# Patient Record
Sex: Female | Born: 1990 | Race: Black or African American | Hispanic: No | Smoking: Never smoker
Health system: Southern US, Community
[De-identification: ages and names within clinical notes are randomized; demographics above are authoritative.]

## PROBLEM LIST (undated history)

## (undated) ENCOUNTER — Inpatient Hospital Stay (HOSPITAL_COMMUNITY): Payer: Self-pay

## (undated) DIAGNOSIS — I10 Essential (primary) hypertension: Secondary | ICD-10-CM

## (undated) DIAGNOSIS — Z8742 Personal history of other diseases of the female genital tract: Secondary | ICD-10-CM

## (undated) DIAGNOSIS — N83209 Unspecified ovarian cyst, unspecified side: Secondary | ICD-10-CM

## (undated) DIAGNOSIS — E119 Type 2 diabetes mellitus without complications: Secondary | ICD-10-CM

## (undated) DIAGNOSIS — Z3490 Encounter for supervision of normal pregnancy, unspecified, unspecified trimester: Secondary | ICD-10-CM

## (undated) DIAGNOSIS — K5900 Constipation, unspecified: Secondary | ICD-10-CM

## (undated) DIAGNOSIS — O139 Gestational [pregnancy-induced] hypertension without significant proteinuria, unspecified trimester: Secondary | ICD-10-CM

## (undated) DIAGNOSIS — E162 Hypoglycemia, unspecified: Secondary | ICD-10-CM

## (undated) DIAGNOSIS — D573 Sickle-cell trait: Secondary | ICD-10-CM

## (undated) DIAGNOSIS — K259 Gastric ulcer, unspecified as acute or chronic, without hemorrhage or perforation: Secondary | ICD-10-CM

## (undated) DIAGNOSIS — O24419 Gestational diabetes mellitus in pregnancy, unspecified control: Secondary | ICD-10-CM

## (undated) DIAGNOSIS — D649 Anemia, unspecified: Secondary | ICD-10-CM

## (undated) HISTORY — DX: Essential (primary) hypertension: I10

## (undated) HISTORY — DX: Personal history of other diseases of the female genital tract: Z87.42

## (undated) HISTORY — DX: Gestational diabetes mellitus in pregnancy, unspecified control: O24.419

## (undated) HISTORY — DX: Anemia, unspecified: D64.9

## (undated) HISTORY — DX: Gestational (pregnancy-induced) hypertension without significant proteinuria, unspecified trimester: O13.9

## (undated) HISTORY — DX: Type 2 diabetes mellitus without complications: E11.9

## (undated) HISTORY — PX: EYE SURGERY: SHX253

---
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
## 2011-01-25 LAB — MONONUCLEOSIS SCREEN: Mono Screen: NEGATIVE

## 2011-01-25 LAB — CBC
HCT: 39.2
Hemoglobin: 13.2
Platelets: 235
WBC: 16.6 — ABNORMAL HIGH

## 2011-01-25 LAB — DIFFERENTIAL
Eosinophils Relative: 0
Lymphocytes Relative: 4 — ABNORMAL LOW
Lymphs Abs: 0.7 — ABNORMAL LOW
Monocytes Absolute: 1.4 — ABNORMAL HIGH

## 2011-01-25 LAB — POCT I-STAT, CHEM 8
Chloride: 105
Glucose, Bld: 73
HCT: 42
Potassium: 4
Sodium: 138

## 2011-01-25 LAB — CSF CELL COUNT WITH DIFFERENTIAL
RBC Count, CSF: 11 — ABNORMAL HIGH
Tube #: 1
Tube #: 3

## 2011-01-25 LAB — GRAM STAIN

## 2011-01-25 LAB — CSF CULTURE W GRAM STAIN: Gram Stain: NONE SEEN

## 2011-01-25 LAB — GLUCOSE, CSF: Glucose, CSF: 54

## 2011-01-25 LAB — PROTEIN, CSF: Total  Protein, CSF: 21

## 2011-02-04 LAB — URINALYSIS, ROUTINE W REFLEX MICROSCOPIC
Bilirubin Urine: NEGATIVE
Glucose, UA: NEGATIVE
Hgb urine dipstick: NEGATIVE
Specific Gravity, Urine: 1.014
Urobilinogen, UA: 1

## 2011-02-04 LAB — CBC
Platelets: 273
RBC: 4.81
WBC: 9.5

## 2011-02-04 LAB — DIFFERENTIAL
Basophils Absolute: 0
Eosinophils Absolute: 0
Eosinophils Relative: 0
Lymphocytes Relative: 14 — ABNORMAL LOW
Lymphs Abs: 1.3 — ABNORMAL LOW
Monocytes Absolute: 0.5

## 2011-02-04 LAB — COMPREHENSIVE METABOLIC PANEL
ALT: 13
AST: 18
Albumin: 3.9
CO2: 26
Chloride: 104
Sodium: 138
Total Bilirubin: 0.8

## 2011-02-04 LAB — LIPASE, BLOOD: Lipase: 16

## 2011-02-04 LAB — AMYLASE: Amylase: 109

## 2011-02-04 LAB — RAPID STREP SCREEN (MED CTR MEBANE ONLY): Streptococcus, Group A Screen (Direct): NEGATIVE

## 2011-02-05 LAB — DIFFERENTIAL
Basophils Relative: 1
Eosinophils Absolute: 0
Eosinophils Relative: 1
Lymphs Abs: 2.1
Neutrophils Relative %: 59

## 2011-02-05 LAB — CBC
Hemoglobin: 13.2
MCHC: 33.7
RBC: 4.71
WBC: 6.8

## 2011-02-05 LAB — URINALYSIS, ROUTINE W REFLEX MICROSCOPIC
Bilirubin Urine: NEGATIVE
Nitrite: NEGATIVE
Specific Gravity, Urine: 1.015
Urobilinogen, UA: 0.2
pH: 6.5

## 2011-02-05 LAB — COMPREHENSIVE METABOLIC PANEL
ALT: 13
AST: 18
Alkaline Phosphatase: 66
CO2: 24
Calcium: 9.2
Chloride: 108
Glucose, Bld: 68 — ABNORMAL LOW
Potassium: 3.4 — ABNORMAL LOW
Sodium: 139

## 2011-02-05 LAB — PREGNANCY, URINE: Preg Test, Ur: NEGATIVE

## 2011-03-28 ENCOUNTER — Emergency Department (HOSPITAL_COMMUNITY)
Admission: EM | Admit: 2011-03-28 | Discharge: 2011-03-28 | Discharge status: Against Medical Advice | Payer: Medicaid Other

## 2011-04-01 ENCOUNTER — Ambulatory Visit (INDEPENDENT_AMBULATORY_CARE_PROVIDER_SITE_OTHER): Payer: Medicaid Other | Admitting: Family Medicine

## 2011-04-01 ENCOUNTER — Encounter: Payer: Self-pay | Admitting: Family Medicine

## 2011-04-01 VITALS — BP 121/78 | HR 80 | Temp 97.8°F | Ht 63.0 in | Wt 115.0 lb

## 2011-04-01 DIAGNOSIS — Z309 Encounter for contraceptive management, unspecified: Secondary | ICD-10-CM

## 2011-04-01 DIAGNOSIS — N912 Amenorrhea, unspecified: Secondary | ICD-10-CM

## 2011-04-01 DIAGNOSIS — R1084 Generalized abdominal pain: Secondary | ICD-10-CM

## 2011-04-01 LAB — POCT URINE PREGNANCY: Preg Test, Ur: NEGATIVE

## 2011-04-01 MED ORDER — MEDROXYPROGESTERONE ACETATE 150 MG/ML IM SUSP
150.0000 mg | Freq: Once | INTRAMUSCULAR | Status: AC
  Administered 2011-04-01: 150 mg via INTRAMUSCULAR

## 2011-04-01 MED ORDER — PANTOPRAZOLE SODIUM 40 MG PO TBEC: 40.0000 mg | DELAYED_RELEASE_TABLET | Freq: Every day | ORAL | Status: DC

## 2011-04-01 NOTE — Progress Notes (Signed)
  Subjective:    Patient ID: Brain Hilts, female    DOB: 09-09-1990, 20 y.o.   MRN: [id number]  HPI Nausea x2 weeks: Patient reports nausea x2 weeks. Worse at bedtime. Had vomiting x1 yesterday.  States that she can feel something come up into her chest when she burps at nighttime, almost like she is about to vomit.  No burning in chest. No history of acid reflux. Symptoms worse when she lays flat on her back.has had nipple soreness x1 week. In lower abdominal cramping x1 week. States she is due for her period now. His past due for Depo-Provera injection.-Was due in August 2012. Patient is concerned that she is pregnant. Sexually active with boyfriend. Denies alcohol use. Has only used Motrin x1 during the past 2 weeks.states she has had headaches frequently since May.no dark stools. No bloody stools.  Review of Systems As per above    Objective:   Physical Exam  Constitutional: She is oriented to person, place, and time. She appears well-developed and well-nourished.  HENT:  Head: Normocephalic and atraumatic.  Mouth/Throat: No oropharyngeal exudate.  Eyes: Pupils are equal, round, and reactive to light.  Neck: Normal range of motion.  Cardiovascular: Normal rate, regular rhythm and normal heart sounds.   No murmur heard. Pulmonary/Chest: Effort normal. No respiratory distress.  Abdominal: Soft. She exhibits no distension and no mass. There is tenderness. There is no rebound and no guarding.       Minimal discomfort with palpation of epigastric area.   Musculoskeletal: Normal range of motion. She exhibits no edema.  Neurological: She is alert and oriented to person, place, and time.  Skin: No rash noted.  Psychiatric: She has a normal mood and affect. Her behavior is normal.          Assessment & Plan:

## 2011-04-09 NOTE — Assessment & Plan Note (Addendum)
Symptoms most consistent with GERD- worse at night, when lying down, feels "like a burp coming up in back of throat"--will treat with ppi for 2-4 weeks.  Pt to follow up after that time to see if improvement.  If new or worsening of symptoms pt to return sooner. Urine preg negative.  Gave dose of depo provera.

## 2011-04-29 ENCOUNTER — Ambulatory Visit: Payer: Medicaid Other | Admitting: Family Medicine

## 2011-05-07 ENCOUNTER — Encounter (HOSPITAL_COMMUNITY): Payer: Self-pay | Admitting: *Deleted

## 2011-05-07 ENCOUNTER — Emergency Department (HOSPITAL_COMMUNITY)
Admission: EM | Admit: 2011-05-07 | Discharge: 2011-05-07 | Disposition: A | Discharge status: Home / Self Care | Payer: Self-pay | Attending: Emergency Medicine | Admitting: Emergency Medicine

## 2011-05-07 DIAGNOSIS — R110 Nausea: Secondary | ICD-10-CM | POA: Insufficient documentation

## 2011-05-07 DIAGNOSIS — B9689 Other specified bacterial agents as the cause of diseases classified elsewhere: Secondary | ICD-10-CM | POA: Insufficient documentation

## 2011-05-07 DIAGNOSIS — R10819 Abdominal tenderness, unspecified site: Secondary | ICD-10-CM | POA: Insufficient documentation

## 2011-05-07 DIAGNOSIS — A499 Bacterial infection, unspecified: Secondary | ICD-10-CM | POA: Insufficient documentation

## 2011-05-07 DIAGNOSIS — N760 Acute vaginitis: Secondary | ICD-10-CM | POA: Insufficient documentation

## 2011-05-07 DIAGNOSIS — R350 Frequency of micturition: Secondary | ICD-10-CM | POA: Insufficient documentation

## 2011-05-07 HISTORY — DX: Hypoglycemia, unspecified: E16.2

## 2011-05-07 LAB — URINALYSIS, ROUTINE W REFLEX MICROSCOPIC
Bilirubin Urine: NEGATIVE
Hgb urine dipstick: NEGATIVE
Ketones, ur: NEGATIVE mg/dL
Specific Gravity, Urine: 1.015 (ref 1.005–1.030)
Urobilinogen, UA: 1 mg/dL (ref 0.0–1.0)

## 2011-05-07 LAB — POCT I-STAT, CHEM 8
Chloride: 109 mEq/L (ref 96–112)
Creatinine, Ser: 0.9 mg/dL (ref 0.50–1.10)
Glucose, Bld: 86 mg/dL (ref 70–99)
Hemoglobin: 13.6 g/dL (ref 12.0–15.0)
Potassium: 3.7 mEq/L (ref 3.5–5.1)

## 2011-05-07 LAB — URINE MICROSCOPIC-ADD ON

## 2011-05-07 LAB — WET PREP, GENITAL
Trich, Wet Prep: NONE SEEN
Yeast Wet Prep HPF POC: NONE SEEN

## 2011-05-07 MED ORDER — METRONIDAZOLE 500 MG PO TABS: 500.0000 mg | ORAL_TABLET | Freq: Two times a day (BID) | ORAL | Status: AC

## 2011-05-07 NOTE — ED Notes (Signed)
Pt c/o white vag. Discharge onset approx 2 weeks ago.  Pt denies any pain st's she just feels like she has gas.

## 2011-05-07 NOTE — ED Provider Notes (Signed)
History     CSN: [id number]  Arrival date & time 05/07/11  1736   First MD Initiated Contact with Patient 05/07/11 1923      Chief Complaint  Patient presents with  . Vaginal Discharge    (Consider location/radiation/quality/duration/timing/severity/associated sxs/prior treatment) HPI Comments: Patient reports abnormal white malodorous vaginal discharge that began 2 weeks ago.  Pt also has urinary frequency and small amounts of white fuzzy pieces in her stool, and increased gassiness.  Associated nausea.  She had a few spots of blood in her stool x 1 today.  Denies fevers, abdominal pain, dysuria.  States she started using a new body wash just before she developed the vaginal discharge.  Patient is on depo provera.    Patient is a 21 y.o. female presenting with vaginal discharge. The history is provided by the patient.  Vaginal Discharge Pertinent negatives include no chest pain or coughing.    Past Medical History  Diagnosis Date  . Hypoglycemia     History reviewed. No pertinent past surgical history.  History reviewed. No pertinent family history.  History  Substance Use Topics  . Smoking status: Never Smoker   . Smokeless tobacco: Not on file  . Alcohol Use: No    OB History    Grav Para Term Preterm Abortions TAB SAB Ect Mult Living                  Review of Systems  Respiratory: Negative for cough and shortness of breath.   Cardiovascular: Negative for chest pain.  Genitourinary: Positive for vaginal discharge.  All other systems reviewed and are negative.    Allergies  Review of patient's allergies indicates no known allergies.  Home Medications   Current Outpatient Rx  Name Route Sig Dispense Refill  . MEDROXYPROGESTERONE ACETATE 150 MG/ML IM SUSP Intramuscular Inject 150 mg into the muscle every 3 (three) months.      BP 136/96  Pulse 87  Temp(Src) 98.3 F (36.8 C) (Oral)  Resp 18  SpO2 100%  Physical Exam  Nursing note and vitals  reviewed. Constitutional: She is oriented to person, place, and time. She appears well-developed and well-nourished. No distress.  HENT:  Head: Normocephalic and atraumatic.  Neck: Neck supple.  Cardiovascular: Normal rate, regular rhythm and normal heart sounds.   Pulmonary/Chest: Breath sounds normal. No respiratory distress. She has no wheezes. She has no rales. She exhibits no tenderness.  Abdominal: Soft. Bowel sounds are normal. She exhibits no distension and no mass. There is tenderness in the suprapubic area. There is no rebound, no guarding and no CVA tenderness.  Genitourinary: Uterus normal. Cervix exhibits no motion tenderness, no discharge and no friability. Right adnexum displays no mass, no tenderness and no fullness. Left adnexum displays no mass, no tenderness and no fullness. Vaginal discharge found.  Neurological: She is alert and oriented to person, place, and time.    ED Course  Procedures (including critical care time)  Labs Reviewed  URINALYSIS, ROUTINE W REFLEX MICROSCOPIC - Abnormal; Notable for the following:    Leukocytes, UA TRACE (*)    All other components within normal limits  URINE MICROSCOPIC-ADD ON - Abnormal; Notable for the following:    Squamous Epithelial / LPF MANY (*)    All other components within normal limits  WET PREP, GENITAL - Abnormal; Notable for the following:    Clue Cells, Wet Prep FEW (*)    WBC, Wet Prep HPF POC FEW (*)    All other  components within normal limits  POCT PREGNANCY, URINE  POCT I-STAT, CHEM 8  POCT PREGNANCY, URINE  PREGNANCY, URINE  GC/CHLAMYDIA PROBE AMP, GENITAL  I-STAT, CHEM 8   No results found.   1. Bacterial vaginosis       MDM  Well-appearing patient with abnormal vaginal discharge, found to have BV.  Patient d/c home with PCP follow up.  Pt c/o small amount of streaking of blood in stool - Hgb is normal.  UA unremarkable.  VS normal.          [name], [place] 05/07/11 2342

## 2011-05-07 NOTE — ED Notes (Signed)
She has a discharge from her rectum and her vaginal and the drainage has a foul odor for 2 weeks.  lmp on depo nov 2012

## 2011-05-08 NOTE — ED Provider Notes (Signed)
Medical screening examination/treatment/procedure(s) were performed by non-physician practitioner and as supervising physician I was immediately available for consultation/collaboration.  Tori Dattilio L Jaryd Drew, MD 05/08/11 1102 

## 2011-05-25 ENCOUNTER — Emergency Department (HOSPITAL_COMMUNITY)
Admission: EM | Admit: 2011-05-25 | Discharge: 2011-05-25 | Disposition: A | Discharge status: Home / Self Care | Payer: Self-pay | Attending: Emergency Medicine | Admitting: Emergency Medicine

## 2011-05-25 ENCOUNTER — Encounter (HOSPITAL_COMMUNITY): Payer: Self-pay | Admitting: *Deleted

## 2011-05-25 DIAGNOSIS — J029 Acute pharyngitis, unspecified: Secondary | ICD-10-CM | POA: Insufficient documentation

## 2011-05-25 DIAGNOSIS — R599 Enlarged lymph nodes, unspecified: Secondary | ICD-10-CM | POA: Insufficient documentation

## 2011-05-25 LAB — RAPID STREP SCREEN (MED CTR MEBANE ONLY): Streptococcus, Group A Screen (Direct): NEGATIVE

## 2011-05-25 MED ORDER — GI COCKTAIL ~~LOC~~
30.0000 mL | Freq: Once | ORAL | Status: AC
  Administered 2011-05-25: 30 mL via ORAL
  Filled 2011-05-25: qty 30

## 2011-05-25 MED ORDER — PREDNISONE 20 MG PO TABS
20.0000 mg | ORAL_TABLET | Freq: Every day | ORAL | Status: DC
  Administered 2011-05-25: 20 mg via ORAL
  Filled 2011-05-25: qty 1

## 2011-05-25 MED ORDER — HYDROCODONE-ACETAMINOPHEN 7.5-500 MG/15ML PO SOLN: 15.0000 mL | Freq: Four times a day (QID) | ORAL | Status: AC | PRN

## 2011-05-25 NOTE — ED Provider Notes (Signed)
History     CSN: [id number]  Arrival date & time 05/25/11  2119   First MD Initiated Contact with Patient 05/25/11 2210      Chief Complaint  Patient presents with  . Sore Throat    (Consider location/radiation/quality/duration/timing/severity/associated sxs/prior treatment) HPI  21 year old female presents to the ED with chief complaints of sore throat. She states her sore throat begins today. She described pain as a sharp and throbbing sensation worsened with swallowing. Pain radiates from the base of her neck up to both ears. She denies any hearing changes. She denies fever, chills, Raynaud some sneezing, coughing, rash, chest pain, shortness of breath, abdominal pain. She denies any recent trauma.  Past Medical History  Diagnosis Date  . Hypoglycemia     History reviewed. No pertinent past surgical history.  History reviewed. No pertinent family history.  History  Substance Use Topics  . Smoking status: Never Smoker   . Smokeless tobacco: Not on file  . Alcohol Use: No    OB History    Grav Para Term Preterm Abortions TAB SAB Ect Mult Living                  Review of Systems  All other systems reviewed and are negative.    Allergies  Review of patient's allergies indicates no known allergies.  Home Medications   Current Outpatient Rx  Name Route Sig Dispense Refill  . MEDROXYPROGESTERONE ACETATE 150 MG/ML IM SUSP Intramuscular Inject 150 mg into the muscle every 3 (three) months.      BP 129/91  Pulse 63  Temp(Src) 99.5 F (37.5 C) (Oral)  Resp 20  SpO2 100%  Physical Exam  Nursing note and vitals reviewed. Constitutional:       Awake, alert, nontoxic appearance  HENT:  Head: Atraumatic.  Right Ear: Hearing normal.  Left Ear: Hearing normal.  Nose: Nose normal.  Mouth/Throat: Uvula is midline and oropharynx is clear and moist. No oropharyngeal exudate, posterior oropharyngeal edema or posterior oropharyngeal erythema.  Eyes: Right eye  exhibits no discharge. Left eye exhibits no discharge.  Neck: Neck supple.  Pulmonary/Chest: Effort normal. She exhibits no tenderness.  Abdominal: There is no tenderness. There is no rebound.  Musculoskeletal: She exhibits no tenderness.       Baseline ROM, no obvious new focal weakness  Lymphadenopathy:    She has cervical adenopathy.       Right cervical: Superficial cervical adenopathy present. No deep cervical and no posterior cervical adenopathy present.      Left cervical: Superficial cervical adenopathy present. No deep cervical and no posterior cervical adenopathy present.  Neurological:       Mental status and motor strength appears baseline for patient and situation  Skin: No rash noted.  Psychiatric: She has a normal mood and affect.    ED Course  Procedures (including critical care time)   Labs Reviewed  RAPID STREP SCREEN   No results found.   No diagnosis found.    MDM  Patient appears to have some trismus evaluation. No tonsillar enlargement, no evidence of peritonsillar abscess, no evidence of oral mucosal edema. No voice changes. She does have cervical lymphadenopathy. A strep test is negative. She'll be receiving prednisone and a GI cocktail. She'll be discharged if stable to tolerate by mouth.        Fayrene Helper, PA-C 05/25/11 2327

## 2011-05-25 NOTE — ED Notes (Signed)
The pt has sorethroat earpain neck pain for 2 hours.  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

## 2011-06-04 NOTE — ED Provider Notes (Signed)
Medical screening examination/treatment/procedure(s) were performed by non-physician practitioner and as supervising physician I was immediately available for consultation/collaboration.  Raeford Razor, MD 06/04/11 0040

## 2011-08-31 ENCOUNTER — Emergency Department (HOSPITAL_COMMUNITY): Payer: Medicaid Other

## 2011-08-31 ENCOUNTER — Emergency Department (HOSPITAL_COMMUNITY)
Admission: EM | Admit: 2011-08-31 | Discharge: 2011-09-01 | Disposition: A | Discharge status: Home / Self Care | Payer: Medicaid Other | Attending: Emergency Medicine | Admitting: Emergency Medicine

## 2011-08-31 ENCOUNTER — Encounter (HOSPITAL_COMMUNITY): Payer: Self-pay | Admitting: *Deleted

## 2011-08-31 DIAGNOSIS — N760 Acute vaginitis: Secondary | ICD-10-CM | POA: Insufficient documentation

## 2011-08-31 DIAGNOSIS — B9689 Other specified bacterial agents as the cause of diseases classified elsewhere: Secondary | ICD-10-CM | POA: Insufficient documentation

## 2011-08-31 DIAGNOSIS — R10819 Abdominal tenderness, unspecified site: Secondary | ICD-10-CM | POA: Insufficient documentation

## 2011-08-31 DIAGNOSIS — R109 Unspecified abdominal pain: Secondary | ICD-10-CM | POA: Insufficient documentation

## 2011-08-31 DIAGNOSIS — A499 Bacterial infection, unspecified: Secondary | ICD-10-CM | POA: Insufficient documentation

## 2011-08-31 DIAGNOSIS — N949 Unspecified condition associated with female genital organs and menstrual cycle: Secondary | ICD-10-CM | POA: Insufficient documentation

## 2011-08-31 LAB — CBC
HCT: 37.4 % (ref 36.0–46.0)
Hemoglobin: 13.2 g/dL (ref 12.0–15.0)
WBC: 6.7 10*3/uL (ref 4.0–10.5)

## 2011-08-31 LAB — DIFFERENTIAL
Basophils Absolute: 0 10*3/uL (ref 0.0–0.1)
Lymphocytes Relative: 27 % (ref 12–46)
Lymphs Abs: 1.8 10*3/uL (ref 0.7–4.0)
Monocytes Absolute: 0.5 10*3/uL (ref 0.1–1.0)
Monocytes Relative: 7 % (ref 3–12)
Neutro Abs: 4.3 10*3/uL (ref 1.7–7.7)

## 2011-08-31 LAB — WET PREP, GENITAL

## 2011-08-31 LAB — URINALYSIS, ROUTINE W REFLEX MICROSCOPIC
Glucose, UA: NEGATIVE mg/dL
Hgb urine dipstick: NEGATIVE
Specific Gravity, Urine: 1.018 (ref 1.005–1.030)

## 2011-08-31 LAB — PREGNANCY, URINE: Preg Test, Ur: NEGATIVE

## 2011-08-31 LAB — POCT I-STAT, CHEM 8
Creatinine, Ser: 0.8 mg/dL (ref 0.50–1.10)
Hemoglobin: 13.9 g/dL (ref 12.0–15.0)
Sodium: 140 mEq/L (ref 135–145)
TCO2: 25 mmol/L (ref 0–100)

## 2011-08-31 MED ORDER — HYDROCODONE-ACETAMINOPHEN 5-325 MG PO TABS
2.0000 | ORAL_TABLET | Freq: Once | ORAL | Status: AC
  Administered 2011-08-31: 2 via ORAL
  Filled 2011-08-31: qty 2

## 2011-08-31 NOTE — ED Notes (Signed)
The pt has had bi-lateral lateral abd pain for 2 months nv and diarrhea.  lmp April 30

## 2011-08-31 NOTE — ED Notes (Signed)
Patient refused to put on gown.Nurse was informed

## 2011-08-31 NOTE — ED Provider Notes (Signed)
History     CSN: [id number]  Arrival date & time 08/31/11  1653   First MD Initiated Contact with Patient 08/31/11 2009      Chief Complaint  Patient presents with  . Abdominal Pain    (Consider location/radiation/quality/duration/timing/severity/associated sxs/prior treatment) HPI Comments: Patient with PMH of ovarian cysts presents with a several month history of bilateral lower abdominal pain with episodes of the pain shooting to her flanks - states that this is similar to previous episodes of when she has ovarian cysts.  She reports last week she also had curd like vaginal discharge without itching or smell - she states that the pain comes and goes, is not associated with nausea, vomiting, diarrhea, constipation, dysuria or hematuria.  Patient is a 21 y.o. female presenting with abdominal pain. The history is provided by the patient. No language interpreter was used.  Abdominal Pain The primary symptoms of the illness include abdominal pain and vaginal discharge. The primary symptoms of the illness do not include fever, fatigue, shortness of breath, nausea, vomiting, diarrhea, hematemesis, hematochezia, dysuria or vaginal bleeding. The current episode started more than 2 days ago. The onset of the illness was gradual. The problem has not changed since onset. The vaginal discharge is not associated with dysuria.   The patient states that she believes she is currently not pregnant. The patient has not had a change in bowel habit. Symptoms associated with the illness do not include chills, anorexia, diaphoresis, heartburn, constipation, urgency, hematuria, frequency or back pain.    Past Medical History  Diagnosis Date  . Hypoglycemia   . Hypoglycemia     History reviewed. No pertinent past surgical history.  No family history on file.  History  Substance Use Topics  . Smoking status: Never Smoker   . Smokeless tobacco: Not on file  . Alcohol Use: No    OB History    Grav  Para Term Preterm Abortions TAB SAB Ect Mult Living                  Review of Systems  Constitutional: Negative for fever, chills, diaphoresis and fatigue.  Respiratory: Negative for shortness of breath.   Gastrointestinal: Positive for abdominal pain. Negative for heartburn, nausea, vomiting, diarrhea, constipation, hematochezia, anorexia and hematemesis.  Genitourinary: Positive for vaginal discharge. Negative for dysuria, urgency, frequency, hematuria and vaginal bleeding.  Musculoskeletal: Negative for back pain.  All other systems reviewed and are negative.    Allergies  Review of patient's allergies indicates no known allergies.  Home Medications   Current Outpatient Rx  Name Route Sig Dispense Refill  . MEDROXYPROGESTERONE ACETATE 150 MG/ML IM SUSP Intramuscular Inject 150 mg into the muscle every 3 (three) months.      BP 131/81  Pulse 72  Temp(Src) 97.5 F (36.4 C) (Oral)  Resp 18  SpO2 100%  LMP 08/26/2011  Physical Exam  Nursing note and vitals reviewed. Constitutional: She is oriented to person, place, and time. She appears well-developed and well-nourished. No distress.  HENT:  Head: Normocephalic and atraumatic.  Right Ear: External ear normal.  Left Ear: External ear normal.  Nose: Nose normal.  Mouth/Throat: Oropharynx is clear and moist. No oropharyngeal exudate.  Eyes: Conjunctivae are normal. Pupils are equal, round, and reactive to light. No scleral icterus.  Neck: Normal range of motion. Neck supple.  Cardiovascular: Normal rate, regular rhythm and normal heart sounds.  Exam reveals no gallop and no friction rub.   No murmur heard. Pulmonary/Chest: Effort  normal and breath sounds normal. No respiratory distress. She has no wheezes. She has no rales. She exhibits no tenderness.  Abdominal: Soft. Normal appearance and bowel sounds are normal. There is tenderness in the right lower quadrant, suprapubic area and left lower quadrant. There is no  rebound, no guarding and no CVA tenderness.    Genitourinary: Uterus normal. There is no rash or tenderness on the right labia. There is no rash or tenderness on the left labia. Uterus is not tender. Cervix exhibits no motion tenderness and no discharge. Right adnexum displays no mass and no tenderness. Left adnexum displays tenderness. Left adnexum displays no mass. No bleeding around the vagina. No vaginal discharge found.  Musculoskeletal: Normal range of motion. She exhibits no edema and no tenderness.  Lymphadenopathy:    She has no cervical adenopathy.  Neurological: She is alert and oriented to person, place, and time. No cranial nerve deficit.  Skin: Skin is warm and dry. No rash noted. No erythema. No pallor.  Psychiatric: She has a normal mood and affect. Her behavior is normal. Judgment and thought content normal.    ED Course  Procedures (including critical care time)  Labs Reviewed  WET PREP, GENITAL - Abnormal; Notable for the following:    Clue Cells Wet Prep HPF POC MODERATE (*)    WBC, Wet Prep HPF POC FEW (*)    All other components within normal limits  URINALYSIS, ROUTINE W REFLEX MICROSCOPIC  PREGNANCY, URINE  CBC  DIFFERENTIAL  POCT I-STAT, CHEM 8  GC/CHLAMYDIA PROBE AMP, GENITAL   No results found. Results for orders placed during the hospital encounter of 08/31/11  URINALYSIS, ROUTINE W REFLEX MICROSCOPIC      Component Value Range   Color, Urine YELLOW  YELLOW    APPearance CLEAR  CLEAR    Specific Gravity, Urine 1.018  1.005 - 1.030    pH 6.0  5.0 - 8.0    Glucose, UA NEGATIVE  NEGATIVE (mg/dL)   Hgb urine dipstick NEGATIVE  NEGATIVE    Bilirubin Urine NEGATIVE  NEGATIVE    Ketones, ur NEGATIVE  NEGATIVE (mg/dL)   Protein, ur NEGATIVE  NEGATIVE (mg/dL)   Urobilinogen, UA 1.0  0.0 - 1.0 (mg/dL)   Nitrite NEGATIVE  NEGATIVE    Leukocytes, UA NEGATIVE  NEGATIVE   PREGNANCY, URINE      Component Value Range   Preg Test, Ur NEGATIVE  NEGATIVE   CBC        Component Value Range   WBC 6.7  4.0 - 10.5 (K/uL)   RBC 4.63  3.87 - 5.11 (MIL/uL)   Hemoglobin 13.2  12.0 - 15.0 (g/dL)   HCT 28.4  13.2 - 44.0 (%)   MCV 80.8  78.0 - 100.0 (fL)   MCH 28.5  26.0 - 34.0 (pg)   MCHC 35.3  30.0 - 36.0 (g/dL)   RDW 10.2  72.5 - 36.6 (%)   Platelets 263  150 - 400 (K/uL)  DIFFERENTIAL      Component Value Range   Neutrophils Relative 65  43 - 77 (%)   Neutro Abs 4.3  1.7 - 7.7 (K/uL)   Lymphocytes Relative 27  12 - 46 (%)   Lymphs Abs 1.8  0.7 - 4.0 (K/uL)   Monocytes Relative 7  3 - 12 (%)   Monocytes Absolute 0.5  0.1 - 1.0 (K/uL)   Eosinophils Relative 1  0 - 5 (%)   Eosinophils Absolute 0.1  0.0 - 0.7 (K/uL)  Basophils Relative 1  0 - 1 (%)   Basophils Absolute 0.0  0.0 - 0.1 (K/uL)  WET PREP, GENITAL      Component Value Range   Yeast Wet Prep HPF POC NONE SEEN  NONE SEEN    Trich, Wet Prep NONE SEEN  NONE SEEN    Clue Cells Wet Prep HPF POC MODERATE (*) NONE SEEN    WBC, Wet Prep HPF POC FEW (*) NONE SEEN   POCT I-STAT, CHEM 8      Component Value Range   Sodium 140  135 - 145 (mEq/L)   Potassium 3.6  3.5 - 5.1 (mEq/L)   Chloride 102  96 - 112 (mEq/L)   BUN 6  6 - 23 (mg/dL)   Creatinine, Ser 4.54  0.50 - 1.10 (mg/dL)   Glucose, Bld 83  70 - 99 (mg/dL)   Calcium, Ion 0.98  1.19 - 1.32 (mmol/L)   TCO2 25  0 - 100 (mmol/L)   Hemoglobin 13.9  12.0 - 15.0 (g/dL)   HCT 14.7  82.9 - 56.2 (%)   US Transvaginal Non-ob  08/31/2011  *RADIOLOGY REPORT*  Clinical Data: Right-sided pelvic pain.  TRANSABDOMINAL AND TRANSVAGINAL ULTRASOUND OF PELVIS Technique:  Both transabdominal and transvaginal ultrasound examinations of the pelvis were performed. Transabdominal technique was performed for global imaging of the pelvis including uterus, ovaries, adnexal regions, and pelvic cul-de-sac.  Comparison: None.   It was necessary to proceed with endovaginal exam following the transabdominal exam to visualize the uterus and ovaries.  Findings:  Uterus:  The anteflexed uterus measures 6.0 x 3.4 x 4.3 cm. Myometrial echotexture is homogeneous.  Endometrium: 8 mm in thickness.  Normal appearance.  Right ovary:  4.5 1.6 x 2.6 cm.  Several follicles noted.  Normal in appearance.  Left ovary: 3.8 x 3.6 x 1.9 cm.  Dominant follicle noted within the parenchyma.  Other findings: No free fluid identified in the cul-de-sac.  IMPRESSION: Unremarkable pelvic ultrasound.  Original Report Authenticated By: ERIC A. MANSELL, M.D.   US Pelvis Complete  08/31/2011  *RADIOLOGY REPORT*  Clinical Data: Right-sided pelvic pain.  TRANSABDOMINAL AND TRANSVAGINAL ULTRASOUND OF PELVIS Technique:  Both transabdominal and transvaginal ultrasound examinations of the pelvis were performed. Transabdominal technique was performed for global imaging of the pelvis including uterus, ovaries, adnexal regions, and pelvic cul-de-sac.  Comparison: None.   It was necessary to proceed with endovaginal exam following the transabdominal exam to visualize the uterus and ovaries.  Findings:  Uterus: The anteflexed uterus measures 6.0 x 3.4 x 4.3 cm. Myometrial echotexture is homogeneous.  Endometrium: 8 mm in thickness.  Normal appearance.  Right ovary:  4.5 1.6 x 2.6 cm.  Several follicles noted.  Normal in appearance.  Left ovary: 3.8 x 3.6 x 1.9 cm.  Dominant follicle noted within the parenchyma.  Other findings: No free fluid identified in the cul-de-sac.  IMPRESSION: Unremarkable pelvic ultrasound.  Original Report Authenticated By: ERIC A. MANSELL, M.D.     Abdominal pain BV    MDM  Patient with a history of ovarian cysts presents with vague shooting abdominal pain - labs and [place] all normal with the exception of the BV, she will follow up with her gynecologist for further evaluation.  I do not believe there is a surgical emergency at this time         Izola Price. [place], [place] 08/31/11 2359

## 2011-09-01 MED ORDER — HYDROCODONE-ACETAMINOPHEN 5-325 MG PO TABS: 2.0000 | ORAL_TABLET | ORAL | Status: AC | PRN

## 2011-09-01 MED ORDER — METRONIDAZOLE 500 MG PO TABS: 500.0000 mg | ORAL_TABLET | Freq: Two times a day (BID) | ORAL | Status: AC

## 2011-09-01 NOTE — Discharge Instructions (Signed)
Abdominal Pain Abdominal pain can be caused by many things. Your caregiver decides the seriousness of your pain by an examination and possibly blood tests and X-rays. Many cases can be observed and treated at home. Most abdominal pain is not caused by a disease and will probably improve without treatment. However, in many cases, more time must pass before a clear cause of the pain can be found. Before that point, it may not be known if you need more testing, or if hospitalization or surgery is needed. HOME CARE INSTRUCTIONS   Do not take laxatives unless directed by your caregiver.   Take pain medicine only as directed by your caregiver.   Only take over-the-counter or prescription medicines for pain, discomfort, or fever as directed by your caregiver.   Try a clear liquid diet (broth, tea, or water) for as long as directed by your caregiver. Slowly move to a bland diet as tolerated.  SEEK IMMEDIATE MEDICAL CARE IF:   The pain does not go away.   You have a fever.   You keep throwing up (vomiting).   The pain is felt only in portions of the abdomen. Pain in the right side could possibly be appendicitis. In an adult, pain in the left lower portion of the abdomen could be colitis or diverticulitis.   You pass bloody or black tarry stools.  MAKE SURE YOU:   Understand these instructions.   Will watch your condition.   Will get help right away if you are not doing well or get worse.  Document Released: 01/20/2005 Document Revised: 04/01/2011 Document Reviewed: 11/29/2007 [facility] Patient Information 2012 [place], [place].Bacterial Vaginosis Bacterial vaginosis (BV) is a vaginal infection where the normal balance of bacteria in the vagina is disrupted. The normal balance is then replaced by an overgrowth of certain bacteria. There are several different kinds of bacteria that can cause BV. BV is the most common vaginal infection in women of childbearing age. CAUSES   The cause of BV is not  fully understood. BV develops when there is an increase or imbalance of harmful bacteria.   Some activities or behaviors can upset the normal balance of bacteria in the vagina and put women at increased risk including:   Having a new sex partner or multiple sex partners.   Douching.   Using an intrauterine device (IUD) for contraception.   It is not clear what role sexual activity plays in the development of BV. However, women that have never had sexual intercourse are rarely infected with BV.  Women do not get BV from toilet seats, bedding, swimming pools or from touching objects around them.  SYMPTOMS   Grey vaginal discharge.   A fish-like odor with discharge, especially after sexual intercourse.   Itching or burning of the vagina and vulva.   Burning or pain with urination.   Some women have no signs or symptoms at all.  DIAGNOSIS  Your caregiver must examine the vagina for signs of BV. Your caregiver will perform lab tests and look at the sample of vaginal fluid through a microscope. They will look for bacteria and abnormal cells (clue cells), a pH test higher than 4.5, and a positive amine test all associated with BV.  RISKS AND COMPLICATIONS   Pelvic inflammatory disease (PID).   Infections following gynecology surgery.   Developing HIV.   Developing herpes virus.  TREATMENT  Sometimes BV will clear up without treatment. However, all women with symptoms of BV should be treated to avoid complications,  especially if gynecology surgery is planned. Female partners generally do not need to be treated. However, BV may spread between female sex partners so treatment is helpful in preventing a recurrence of BV.   BV may be treated with antibiotics. The antibiotics come in either pill or vaginal cream forms. Either can be used with nonpregnant or pregnant women, but the recommended dosages differ. These antibiotics are not harmful to the baby.   BV can recur after treatment. If  this happens, a second round of antibiotics will often be prescribed.   Treatment is important for pregnant women. If not treated, BV can cause a premature delivery, especially for a pregnant woman who had a premature birth in the past. All pregnant women who have symptoms of BV should be checked and treated.   For chronic reoccurrence of BV, treatment with a type of prescribed gel vaginally twice a week is helpful.  HOME CARE INSTRUCTIONS   Finish all medication as directed by your caregiver.   Do not have sex until treatment is completed.   Tell your sexual partner that you have a vaginal infection. They should see their caregiver and be treated if they have problems, such as a mild rash or itching.   Practice safe sex. Use condoms. Only have 1 sex partner.  PREVENTION  Basic prevention steps can help reduce the risk of upsetting the natural balance of bacteria in the vagina and developing BV:  Do not have sexual intercourse (be abstinent).   Do not douche.   Use all of the medicine prescribed for treatment of BV, even if the signs and symptoms go away.   Tell your sex partner if you have BV. That way, they can be treated, if needed, to prevent reoccurrence.  SEEK MEDICAL CARE IF:   Your symptoms are not improving after 3 days of treatment.   You have increased discharge, pain, or fever.  MAKE SURE YOU:   Understand these instructions.   Will watch your condition.   Will get help right away if you are not doing well or get worse.  FOR MORE INFORMATION  Division of STD Prevention (DSTDP), Centers for Disease Control and Prevention: [employer] American Social Health Association (ASHA): www.ashastd.org  Document Released: 04/12/2005 Document Revised: 04/01/2011 Document Reviewed: 10/03/2008 [facility] Patient Information 2012 [place], [place].Abdominal Pain, Women Abdominal (stomach, pelvic, or belly) pain can be caused by many things. It is important to tell your  doctor:  The location of the pain.   Does it come and go or is it present all the time?   Are there things that start the pain (eating certain foods, exercise)?   Are there other symptoms associated with the pain (fever, nausea, vomiting, diarrhea)?  All of this is helpful to know when trying to find the cause of the pain. CAUSES   Stomach: virus or bacteria infection, or ulcer.   Intestine: appendicitis (inflamed appendix), regional ileitis (Crohn's disease), ulcerative colitis (inflamed colon), irritable bowel syndrome, diverticulitis (inflamed diverticulum of the colon), or cancer of the stomach or intestine.   Gallbladder disease or stones in the gallbladder.   Kidney disease, kidney stones, or infection.   Pancreas infection or cancer.   Fibromyalgia (pain disorder).   Diseases of the female organs:   Uterus: fibroid (non-cancerous) tumors or infection.   Fallopian tubes: infection or tubal pregnancy.   Ovary: cysts or tumors.   Pelvic adhesions (scar tissue).   Endometriosis (uterus lining tissue growing in the pelvis and on the pelvic  organs).   Pelvic congestion syndrome (female organs filling up with blood just before the menstrual period).   Pain with the menstrual period.   Pain with ovulation (producing an egg).   Pain with an IUD (intrauterine device, birth control) in the uterus.   Cancer of the female organs.   Functional pain (pain not caused by a disease, may improve without treatment).   Psychological pain.   Depression.  DIAGNOSIS  Your doctor will decide the seriousness of your pain by doing an examination.  Blood tests.   X-rays.   Ultrasound.   CT scan (computed tomography, special type of X-ray).   MRI (magnetic resonance imaging).   Cultures, for infection.   Barium enema (dye inserted in the large intestine, to better view it with X-rays).   Colonoscopy (looking in intestine with a lighted tube).   Laparoscopy (minor  surgery, looking in abdomen with a lighted tube).   Major abdominal exploratory surgery (looking in abdomen with a large incision).  TREATMENT  The treatment will depend on the cause of the pain.   Many cases can be observed and treated at home.   Over-the-counter medicines recommended by your caregiver.   Prescription medicine.   Antibiotics, for infection.   Birth control pills, for painful periods or for ovulation pain.   Hormone treatment, for endometriosis.   Nerve blocking injections.   Physical therapy.   Antidepressants.   Counseling with a psychologist or psychiatrist.   Minor or major surgery.  HOME CARE INSTRUCTIONS   Do not take laxatives, unless directed by your caregiver.   Take over-the-counter pain medicine only if ordered by your caregiver. Do not take aspirin because it can cause an upset stomach or bleeding.   Try a clear liquid diet (broth or water) as ordered by your caregiver. Slowly move to a bland diet, as tolerated, if the pain is related to the stomach or intestine.   Have a thermometer and take your temperature several times a day, and record it.   Bed rest and sleep, if it helps the pain.   Avoid sexual intercourse, if it causes pain.   Avoid stressful situations.   Keep your follow-up appointments and tests, as your caregiver orders.   If the pain does not go away with medicine or surgery, you may try:   Acupuncture.   Relaxation exercises (yoga, meditation).   Group therapy.   Counseling.  SEEK MEDICAL CARE IF:   You notice certain foods cause stomach pain.   Your home care treatment is not helping your pain.   You need stronger pain medicine.   You want your IUD removed.   You feel faint or lightheaded.   You develop nausea and vomiting.   You develop a rash.   You are having side effects or an allergy to your medicine.  SEEK IMMEDIATE MEDICAL CARE IF:   Your pain does not go away or gets worse.   You have a  fever.   Your pain is felt only in portions of the abdomen. The right side could possibly be appendicitis. The left lower portion of the abdomen could be colitis or diverticulitis.   You are passing blood in your stools (bright red or black tarry stools, with or without vomiting).   You have blood in your urine.   You develop chills, with or without a fever.   You pass out.  MAKE SURE YOU:   Understand these instructions.   Will watch your condition.  Will get help right away if you are not doing well or get worse.  Document Released: 02/07/2007 Document Revised: 04/01/2011 Document Reviewed: 02/27/2009 [facility] Patient Information 2012 [place], [place].

## 2011-09-01 NOTE — ED Provider Notes (Signed)
Medical screening examination/treatment/procedure(s) were performed by non-physician practitioner and as supervising physician I was immediately available for consultation/collaboration.  Gerhard Munch, MD 09/01/11 [phone]

## 2011-09-02 LAB — GC/CHLAMYDIA PROBE AMP, GENITAL: Chlamydia, DNA Probe: NEGATIVE

## 2011-09-13 ENCOUNTER — Inpatient Hospital Stay (HOSPITAL_COMMUNITY)
Admission: AD | Admit: 2011-09-13 | Discharge: 2011-09-13 | Disposition: A | Discharge status: Home / Self Care | Payer: Medicaid Other | Source: Ambulatory Visit | Attending: Obstetrics & Gynecology | Admitting: Obstetrics & Gynecology

## 2011-09-13 ENCOUNTER — Inpatient Hospital Stay (HOSPITAL_COMMUNITY): Payer: Medicaid Other

## 2011-09-13 ENCOUNTER — Encounter (HOSPITAL_COMMUNITY): Payer: Self-pay | Admitting: *Deleted

## 2011-09-13 DIAGNOSIS — R1084 Generalized abdominal pain: Secondary | ICD-10-CM

## 2011-09-13 DIAGNOSIS — R1032 Left lower quadrant pain: Secondary | ICD-10-CM | POA: Insufficient documentation

## 2011-09-13 DIAGNOSIS — R1012 Left upper quadrant pain: Secondary | ICD-10-CM | POA: Insufficient documentation

## 2011-09-13 HISTORY — DX: Unspecified ovarian cyst, unspecified side: N83.209

## 2011-09-13 LAB — URINALYSIS, ROUTINE W REFLEX MICROSCOPIC
Leukocytes, UA: NEGATIVE
Nitrite: NEGATIVE
Specific Gravity, Urine: 1.02 (ref 1.005–1.030)
Urobilinogen, UA: 0.2 mg/dL (ref 0.0–1.0)

## 2011-09-13 LAB — POCT PREGNANCY, URINE: Preg Test, Ur: NEGATIVE

## 2011-09-13 LAB — WET PREP, GENITAL: Clue Cells Wet Prep HPF POC: NONE SEEN

## 2011-09-13 MED ORDER — KETOROLAC TROMETHAMINE 30 MG/ML IJ SOLN
60.0000 mg | Freq: Once | INTRAMUSCULAR | Status: DC
  Filled 2011-09-13: qty 1
  Filled 2011-09-13: qty 2

## 2011-09-13 MED ORDER — KETOROLAC TROMETHAMINE 60 MG/2ML IM SOLN
60.0000 mg | Freq: Once | INTRAMUSCULAR | Status: AC
  Administered 2011-09-13: 60 mg via INTRAMUSCULAR

## 2011-09-13 MED ORDER — TRAMADOL HCL 50 MG PO TABS: 50.0000 mg | ORAL_TABLET | Freq: Four times a day (QID) | ORAL | Status: DC | PRN

## 2011-09-13 NOTE — MAU Provider Note (Signed)
History     CSN: [id number]  Arrival date and time: 09/13/11 1346   None     Chief Complaint  Patient presents with  . Abdominal Pain   Abdominal Pain This is a recurrent problem. The current episode started today. The onset quality is sudden. The problem occurs constantly. The most recent episode lasted 3 hours. The problem has been unchanged. The pain is located in the LUQ. The pain is at a severity of 10/10. The pain is severe. The quality of the pain is burning. The abdominal pain radiates to the LLQ and left flank. Associated symptoms include frequency. Pertinent negatives include no anorexia, constipation, diarrhea, dysuria, fever, headaches, hematochezia, hematuria, melena, nausea or vomiting. Associated symptoms comments: mucousy bowel movements since January .    Pertinent Gynecological History: Menses: on DEPO-Provera - no menses for >1 year until this month; with 5 days of bleeding and 2 additional days of spotting Bleeding: only with menses Contraception: Depo-Provera injections Sexually transmitted diseases: no past history Previous GYN Procedures: Laproscopic Cystectomy  Last pap: not indicated   Past Medical History  Diagnosis Date  . Hypoglycemia   . Hypoglycemia   . Ovarian cyst 2011    Past Surgical History  Procedure Date  . Laparoscopic ovarian cystectomy 2011  . Eye surgery     2004    Family History  Problem Relation Age of Onset  . Anesthesia problems Neg Hx     History  Substance Use Topics  . Smoking status: Never Smoker   . Smokeless tobacco: Not on file  . Alcohol Use: No    Allergies: No Known Allergies  Prescriptions prior to admission  Medication Sig Dispense Refill  . medroxyPROGESTERone (DEPO-PROVERA) 150 MG/ML injection Inject 150 mg into the muscle every 3 (three) months.        Review of Systems  Constitutional: Negative for fever.  Eyes: Negative.   Respiratory: Negative.   Cardiovascular: Negative.     Gastrointestinal: Positive for abdominal pain. Negative for nausea, vomiting, diarrhea, constipation, blood in stool, melena, hematochezia and anorexia.  Genitourinary: Positive for urgency, frequency and flank pain. Negative for dysuria and hematuria.  Musculoskeletal: Negative.   Skin: Negative.   Neurological: Negative.  Negative for headaches.  Endo/Heme/Allergies: Negative.   Psychiatric/Behavioral: Negative.    Physical Exam   Blood pressure 127/87, pulse 85, temperature 98.5 F (36.9 C), temperature source Oral, resp. rate 16, height 5\' 5"  (1.651 m), weight 53.978 kg (119 lb), last menstrual period 08/26/2011, SpO2 100.00%.  Physical Exam  Constitutional: She appears well-developed and well-nourished. No distress.  HENT:  Head: Normocephalic.  Eyes: Right eye exhibits no discharge. Left eye exhibits no discharge. No scleral icterus.  Cardiovascular: Normal rate, regular rhythm and intact distal pulses.  Exam reveals no gallop and no friction rub.   No murmur heard. Respiratory: Effort normal and breath sounds normal. No respiratory distress. She has no wheezes. She has no rales. She exhibits no tenderness.  GI: Soft. She exhibits no distension and no mass. There is tenderness. There is guarding. There is no rebound.  Skin: She is not diaphoretic.    MAU Course  Procedures US Transvaginal Non-ob  09/13/2011  *RADIOLOGY REPORT*  Clinical Data: Acute right pelvic pain.  Evaluate for possible torsion.  LMP 08/31/2011.  Urine pregnancy test today is negative.  TRANSVAGINAL ULTRASOUND OF PELVIS DOPPLER ULTRASOUND OF OVARIES  Technique:  Transvaginal ultrasound examination of the pelvis was performed including evaluation of the uterus, ovaries, adnexal regions, and  pelvic cul-de-sac. Color and duplex Doppler ultrasound was utilized to evaluate blood flow to the ovaries.  Comparison:  Ultrasound 08/31/2011  Findings:  Uterus:  The uterus is 7.4 x 3.4 x 5.4 cm.  No uterine mass  identified.  Endometrium: The endometrium is mildly heterogeneous, 9.5 mm in diameter. Small fluid collection within the endometrium is noted. Urine pregnancy test is negative.  Right ovary: Right ovary is 3.8 x 1.4 x 2.3 cm.  Left ovary: Left ovary is 4.5 x 2.9 x 3.4 cm.  Other Findings:  There is a trace of free pelvic fluid.  Pulsed Doppler evaluation demonstrates Normal study. No evidence of pelvic mass or other significant abnormality.  IMPRESSION: Normal study. No evidence of pelvic mass or other significant abnormality.  No sonographic evidence for ovarian torsion.  Original Report Authenticated By: Patterson Hammersmith, M.D.   Results for orders placed during the hospital encounter of 09/13/11 (from the past 24 hour(s))  URINALYSIS, ROUTINE W REFLEX MICROSCOPIC     Status: Normal   Collection Time   09/13/11  2:55 PM      Component Value Range   Color, Urine YELLOW  YELLOW    APPearance CLEAR  CLEAR    Specific Gravity, Urine 1.020  1.005 - 1.030    pH 6.0  5.0 - 8.0    Glucose, UA NEGATIVE  NEGATIVE (mg/dL)   Hgb urine dipstick NEGATIVE  NEGATIVE    Bilirubin Urine NEGATIVE  NEGATIVE    Ketones, ur NEGATIVE  NEGATIVE (mg/dL)   Protein, ur NEGATIVE  NEGATIVE (mg/dL)   Urobilinogen, UA 0.2  0.0 - 1.0 (mg/dL)   Nitrite NEGATIVE  NEGATIVE    Leukocytes, UA NEGATIVE  NEGATIVE   POCT PREGNANCY, URINE     Status: Normal   Collection Time   09/13/11  3:03 PM      Component Value Range   Preg Test, Ur NEGATIVE  NEGATIVE   WET PREP, GENITAL     Status: Abnormal   Collection Time   09/13/11  4:30 PM      Component Value Range   Yeast Wet Prep HPF POC NONE SEEN  NONE SEEN    Trich, Wet Prep NONE SEEN  NONE SEEN    Clue Cells Wet Prep HPF POC NONE SEEN  NONE SEEN    WBC, Wet Prep HPF POC MODERATE (*) NONE SEEN      MDM RLQ abdominal pain consistent with pt frequent episodes.  Acute onset 4 hours prior to evaluation.   Repeat u/s showed good doppler flow reassuring for ovarian  torsion. Physical exam negative for peritonitis. Hx of ovarian cysts without evidence today. Consider Mittleschmerz associated pain as ~14 days from LMP G/C chlamydia pending; no BV, Tric, or yeast  Assessment and Plan  Non-specific abdominal pain.  Rx for Tramadol.  Follow up with OB/GYN for consideration of repeat Pelvic MRI as pt reports being told this will be the imaging test of choice in the future her prior 8cm ovarian cysts was not seen on [place].    Andrena Mews, DO [facility] Resident - PGY-1 09/13/2011 6:42 PM

## 2011-09-13 NOTE — Discharge Instructions (Signed)
Your ultrasound was reassuring and did not show any abnormalities.  Please follow up with your OB/GYN and Family Doctor to address your chronic abdominal pain better.  Please return to your clinic to receive your Depo-Provera as this will likely help control your symptoms long term  Abdominal Pain Abdominal pain can be caused by many things. Your caregiver decides the seriousness of your pain by an examination and possibly blood tests and X-rays. Many cases can be observed and treated at home. Most abdominal pain is not caused by a disease and will probably improve without treatment. However, in many cases, more time must pass before a clear cause of the pain can be found. Before that point, it may not be known if you need more testing, or if hospitalization or surgery is needed. HOME CARE INSTRUCTIONS   Do not take laxatives unless directed by your caregiver.   Take pain medicine only as directed by your caregiver.   Only take over-the-counter or prescription medicines for pain, discomfort, or fever as directed by your caregiver.   Try a clear liquid diet (broth, tea, or water) for as long as directed by your caregiver. Slowly move to a bland diet as tolerated.  SEEK IMMEDIATE MEDICAL CARE IF:   The pain does not go away.   You have a fever.   You keep throwing up (vomiting).   The pain is felt only in portions of the abdomen. Pain in the right side could possibly be appendicitis. In an adult, pain in the left lower portion of the abdomen could be colitis or diverticulitis.   You pass bloody or black tarry stools.  MAKE SURE YOU:   Understand these instructions.   Will watch your condition.   Will get help right away if you are not doing well or get worse.  Document Released: 01/20/2005 Document Revised: 04/01/2011 Document Reviewed: 11/29/2007 [facility] Patient Information 2012 [place], [place].

## 2011-09-13 NOTE — MAU Note (Signed)
Patient states she started having lower abdominal pain about one month ago. Was seen at [facility] ED on 5-7 and was evaluated with no diagnosis given. States she has a history of large ovarian cyst with surgery in 2011. Similar symptoms, heaviness and pain in the lower abdomen. Some nausea, no vomiting, no bleeding or discharge.

## 2011-09-13 NOTE — MAU Note (Signed)
Dr Mayer Camel at bedside discussing [place], lab results and plan of care

## 2011-09-13 NOTE — MAU Provider Note (Signed)
Attestation of Attending Supervision of Advanced Practitioner: Evaluation and management procedures were performed by the [facility] Fellow/PA/CNM/NP under my supervision and collaboration. Chart reviewed, and agree with management and plan except no further imaging is needed unless patient's symptoms are persistent and there is concern about non-GYN etiology  Jaynie Collins, M.D. 09/13/2011 7:42 PM

## 2011-09-14 LAB — GC/CHLAMYDIA PROBE AMP, GENITAL: GC Probe Amp, Genital: NEGATIVE

## 2011-09-18 ENCOUNTER — Inpatient Hospital Stay (HOSPITAL_COMMUNITY)
Admission: AD | Admit: 2011-09-18 | Discharge: 2011-09-18 | Disposition: A | Discharge status: Home / Self Care | Payer: Medicaid Other | Source: Ambulatory Visit | Attending: Obstetrics & Gynecology | Admitting: Obstetrics & Gynecology

## 2011-09-18 ENCOUNTER — Encounter (HOSPITAL_COMMUNITY): Payer: Self-pay

## 2011-09-18 DIAGNOSIS — R109 Unspecified abdominal pain: Secondary | ICD-10-CM | POA: Insufficient documentation

## 2011-09-18 DIAGNOSIS — Z3201 Encounter for pregnancy test, result positive: Secondary | ICD-10-CM | POA: Insufficient documentation

## 2011-09-18 LAB — URINALYSIS, ROUTINE W REFLEX MICROSCOPIC
Glucose, UA: NEGATIVE mg/dL
Hgb urine dipstick: NEGATIVE
Specific Gravity, Urine: 1.01 (ref 1.005–1.030)
Urobilinogen, UA: 0.2 mg/dL (ref 0.0–1.0)

## 2011-09-18 LAB — CBC
HCT: 38.9 % (ref 36.0–46.0)
MCHC: 34.4 g/dL (ref 30.0–36.0)
RDW: 13 % (ref 11.5–15.5)

## 2011-09-18 LAB — URINE MICROSCOPIC-ADD ON

## 2011-09-18 LAB — POCT PREGNANCY, URINE: Preg Test, Ur: POSITIVE — AB

## 2011-09-18 LAB — HCG, QUANTITATIVE, PREGNANCY: hCG, Beta Chain, Quant, S: 288 m[IU]/mL — ABNORMAL HIGH (ref <5)

## 2011-09-18 MED ORDER — PRENATAL PLUS 27-1 MG PO TABS: 1.0000 | ORAL_TABLET | Freq: Every day | ORAL | Status: DC

## 2011-09-18 NOTE — MAU Provider Note (Signed)
Sherry A Dunston20 y.o.G0P0 @Unknown  by LMP No chief complaint on file.    None     SUBJECTIVE  HPI: Pt presents to MAU with abdominal cramping described as mild and similar to menstrual cramps and 4 positive UPTs at home.  Patient's last menstrual period was 08/01/2011.  She was using Depo Provera for contraception but did not go to clinic for her last shot because she did not like side effects of medication, and did not begin using another form of birth control.  She had a neg UPT and pelvic exam on 09/13/11 in MAU.  She denies LOF, vaginal bleeding, vaginal itching/burning, urinary symptoms, h/a, dizziness, n/v, or fever/chills.    Past Medical History  Diagnosis Date  . Hypoglycemia   . Hypoglycemia   . Ovarian cyst 2011   Past Surgical History  Procedure Date  . Laparoscopic ovarian cystectomy 2011  . Eye surgery     2004   History   Social History  . Marital Status: Single    Spouse Name: N/A    Number of Children: N/A  . Years of Education: N/A   Occupational History  . Not on file.   Social History Main Topics  . Smoking status: Never Smoker   . Smokeless tobacco: Not on file  . Alcohol Use: No  . Drug Use: No  . Sexually Active: Yes    Birth Control/ Protection: Condom   Other Topics Concern  . Not on file   Social History Narrative  . No narrative on file   No current facility-administered medications on file prior to encounter.   Current Outpatient Prescriptions on File Prior to Encounter  Medication Sig Dispense Refill  . medroxyPROGESTERone (DEPO-PROVERA) 150 MG/ML injection Inject 150 mg into the muscle every 3 (three) months.       No Known Allergies  ROS: Pertinent items in HPI  OBJECTIVE Blood pressure 115/76, pulse 88, temperature 98.8 F (37.1 C), temperature source Oral, resp. rate 16, height 5\' 3"  (1.6 m), weight 53.978 kg (119 lb), last menstrual period 08/01/2011.  GENERAL: Well-developed, well-nourished female in no acute  distress.  HEENT: Normocephalic, good dentition HEART: normal rate RESP: normal effort ABDOMEN: Soft, nontender EXTREMITIES: Nontender, no edema NEURO: Alert and oriented SPECULUM EXAM: Deferred--done 09/13/11  Bimanual exam: Cervix 0/long/high, firm, anterior, neg CMT, uterus nontender, nonenlarged, adnexa without tenderness, enlargement, or mass  LAB RESULTS Results for orders placed during the hospital encounter of 09/18/11 (from the past 24 hour(s))  URINALYSIS, ROUTINE W REFLEX MICROSCOPIC     Status: Abnormal   Collection Time   09/18/11  7:50 AM      Component Value Range   Color, Urine YELLOW  YELLOW    APPearance CLEAR  CLEAR    Specific Gravity, Urine 1.010  1.005 - 1.030    pH 6.0  5.0 - 8.0    Glucose, UA NEGATIVE  NEGATIVE (mg/dL)   Hgb urine dipstick NEGATIVE  NEGATIVE    Bilirubin Urine NEGATIVE  NEGATIVE    Ketones, ur NEGATIVE  NEGATIVE (mg/dL)   Protein, ur NEGATIVE  NEGATIVE (mg/dL)   Urobilinogen, UA 0.2  0.0 - 1.0 (mg/dL)   Nitrite NEGATIVE  NEGATIVE    Leukocytes, UA TRACE (*) NEGATIVE   URINE MICROSCOPIC-ADD ON     Status: Abnormal   Collection Time   09/18/11  7:50 AM      Component Value Range   Squamous Epithelial / LPF FEW (*) RARE    WBC, UA 3-6  <  3 (WBC/hpf)   RBC / HPF 0-2  <3 (RBC/hpf)   Bacteria, UA FEW (*) RARE   POCT PREGNANCY, URINE     Status: Abnormal   Collection Time   09/18/11  7:54 AM      Component Value Range   Preg Test, Ur POSITIVE (*) NEGATIVE   CBC     Status: Normal   Collection Time   09/18/11  8:21 AM      Component Value Range   WBC 6.4  4.0 - 10.5 (K/uL)   RBC 4.83  3.87 - 5.11 (MIL/uL)   Hemoglobin 13.4  12.0 - 15.0 (g/dL)   HCT 16.1  09.6 - 04.5 (%)   MCV 80.5  78.0 - 100.0 (fL)   MCH 27.7  26.0 - 34.0 (pg)   MCHC 34.4  30.0 - 36.0 (g/dL)   RDW 40.9  81.1 - 91.4 (%)   Platelets 229  150 - 400 (K/uL)  ABO/RH     Status: Normal   Collection Time   09/18/11  8:21 AM      Component Value Range   ABO/RH(D) O POS      HCG, QUANTITATIVE, PREGNANCY     Status: Abnormal   Collection Time   09/18/11  8:21 AM      Component Value Range   hCG, Beta Chain, Quant, S 288 (*) <5 (mIU/mL)     IMAGING Deferred--done 09/13/11 indicating some endometrial fluid but UPT neg at that time  ASSESSMENT Positive UPT  PLAN D/C home with precautions F/U with quant HCG in MAU in 48 hours--Monday am Return to MAU if bleeding, n/v, significant abdominal pain, fever/chills   LEFTWICH-KIRBY, Bentleigh Stankus 09/18/2011 9:01 AM

## 2011-09-18 NOTE — MAU Note (Signed)
Patient states having a constant pain on her right lower quadrant has a history of ovarian cyst.

## 2011-09-18 NOTE — MAU Note (Signed)
Took 4 pregnancy test all positive LMP 08/01/11 nausea.

## 2011-09-18 NOTE — Discharge Instructions (Signed)
Pregnancy - First Trimester  During sexual intercourse, millions of sperm go into the vagina. Only 1 sperm will penetrate and fertilize the female egg while it is in the Fallopian tube. One week later, the fertilized egg implants into the wall of the uterus. An embryo begins to develop into a baby. At 6 to 8 weeks, the eyes and face are formed and the heartbeat can be seen on ultrasound. At the end of 12 weeks (first trimester), all the baby's organs are formed. Now that you are pregnant, you will want to do everything you can to have a healthy baby. Two of the most important things are to get good prenatal care and follow your caregiver's instructions. Prenatal care is all the medical care you receive before the baby's birth. It is given to prevent, find, and treat problems during the pregnancy and childbirth.  PRENATAL EXAMS   During prenatal visits, your weight, blood pressure and urine are checked. This is done to make sure you are healthy and progressing normally during the pregnancy.   A pregnant woman should gain 25 to 35 pounds during the pregnancy. However, if you are over weight or underweight, your caregiver will advise you regarding your weight.   Your caregiver will ask and answer questions for you.   Blood work, cervical cultures, other necessary tests and a Pap test are done during your prenatal exams. These tests are done to check on your health and the probable health of your baby. Tests are strongly recommended and done for HIV with your permission. This is the virus that causes AIDS. These tests are done because medications can be given to help prevent your baby from being born with this infection should you have been infected without knowing it. Blood work is also used to find out your blood type, previous infections and follow your blood levels (hemoglobin).   Low hemoglobin (anemia) is common during pregnancy. Iron and vitamins are given to help prevent this. Later in the pregnancy, blood  tests for diabetes will be done along with any other tests if any problems develop. You may need tests to make sure you and the baby are doing well.   You may need other tests to make sure you and the baby are doing well.  CHANGES DURING THE FIRST TRIMESTER (THE FIRST 3 MONTHS OF PREGNANCY)  Your body goes through many changes during pregnancy. They vary from person to person. Talk to your caregiver about changes you notice and are concerned about. Changes can include:   Your menstrual period stops.   The egg and sperm carry the genes that determine what you look like. Genes from you and your partner are forming a baby. The female genes determine whether the baby is a boy or a girl.   Your body increases in girth and you may feel bloated.   Feeling sick to your stomach (nauseous) and throwing up (vomiting). If the vomiting is uncontrollable, call your caregiver.   Your breasts will begin to enlarge and become tender.   Your nipples may stick out more and become darker.   The need to urinate more. Painful urination may mean you have a bladder infection.   Tiring easily.   Loss of appetite.   Cravings for certain kinds of food.   At first, you may gain or lose a couple of pounds.   You may have changes in your emotions from day to day (excited to be pregnant or concerned something may go wrong with   the pregnancy and baby).   You may have more vivid and strange dreams.  HOME CARE INSTRUCTIONS    It is very important to avoid all smoking, alcohol and un-prescribed drugs during your pregnancy. These affect the formation and growth of the baby. Avoid chemicals while pregnant to ensure the delivery of a healthy infant.   Start your prenatal visits by the 12th week of pregnancy. They are usually scheduled monthly at first, then more often in the last 2 months before delivery. Keep your caregiver's appointments. Follow your caregiver's instructions regarding medication use, blood and lab tests, exercise, and  diet.   During pregnancy, you are providing food for you and your baby. Eat regular, well-balanced meals. Choose foods such as meat, fish, milk and other low fat dairy products, vegetables, fruits, and whole-grain breads and cereals. Your caregiver will tell you of the ideal weight gain.   You can help morning sickness by keeping soda crackers at the bedside. Eat a couple before arising in the morning. You may want to use the crackers without salt on them.   Eating 4 to 5 small meals rather than 3 large meals a day also may help the nausea and vomiting.   Drinking liquids between meals instead of during meals also seems to help nausea and vomiting.   A physical sexual relationship may be continued throughout pregnancy if there are no other problems. Problems may be early (premature) leaking of amniotic fluid from the membranes, vaginal bleeding, or belly (abdominal) pain.   Exercise regularly if there are no restrictions. Check with your caregiver or physical therapist if you are unsure of the safety of some of your exercises. Greater weight gain will occur in the last 2 trimesters of pregnancy. Exercising will help:   Control your weight.   Keep you in shape.   Prepare you for labor and delivery.   Help you lose your pregnancy weight after you deliver your baby.   Wear a good support or jogging bra for breast tenderness during pregnancy. This may help if worn during sleep too.   Ask when prenatal classes are available. Begin classes when they are offered.   Do not use hot tubs, steam rooms or saunas.   Wear your seat belt when driving. This protects you and your baby if you are in an accident.   Avoid raw meat, uncooked cheese, cat litter boxes and soil used by cats throughout the pregnancy. These carry germs that can cause birth defects in the baby.   The first trimester is a good time to visit your dentist for your dental health. Getting your teeth cleaned is OK. Use a softer toothbrush and brush  gently during pregnancy.   Ask for help if you have financial, counseling or nutritional needs during pregnancy. Your caregiver will be able to offer counseling for these needs as well as refer you for other special needs.   Do not take any medications or herbs unless told by your caregiver.   Inform your caregiver if there is any mental or physical domestic violence.   Make a list of emergency phone numbers of family, friends, hospital, and police and fire departments.   Write down your questions. Take them to your prenatal visit.   Do not douche.   Do not cross your legs.   If you have to stand for long periods of time, rotate you feet or take small steps in a circle.   You may have more vaginal secretions that may   tampons or scented sanitary pads.  MEDICATIONS AND DRUG USE IN PREGNANCY  Take prenatal vitamins as directed. The vitamin should contain 1 milligram of folic acid. Keep all vitamins out of reach of children. Only a couple vitamins or tablets containing iron may be fatal to a baby or young child when ingested.   Avoid use of all medications, including herbs, over-the-counter medications, not prescribed or suggested by your caregiver. Only take over-the-counter or prescription medicines for pain, discomfort, or fever as directed by your caregiver. Do not use aspirin, ibuprofen, or naproxen unless directed by your caregiver.   Let your caregiver also know about herbs you may be using.   Alcohol is related to a number of birth defects. This includes fetal alcohol syndrome. All alcohol, in any form, should be avoided completely. Smoking will cause low birth rate and premature babies.   Street or illegal drugs are very harmful to the baby. They are absolutely forbidden. A baby born to an addicted mother will be addicted at birth. The baby will go through the same withdrawal an adult does.   Let your  caregiver know about any medications that you have to take and for what reason you take them.  MISCARRIAGE IS COMMON DURING PREGNANCY A miscarriage does not mean you did something wrong. It is not a reason to worry about getting pregnant again. Your caregiver will help you with questions you may have. If you have a miscarriage, you may need minor surgery. SEEK MEDICAL CARE IF:  You have any concerns or worries during your pregnancy. It is better to call with your questions if you feel they cannot wait, rather than worry about them. SEEK IMMEDIATE MEDICAL CARE IF:   An unexplained oral temperature above 102 F (38.9 C) develops, or as your caregiver suggests.   You have leaking of fluid from the vagina (birth canal). If leaking membranes are suspected, take your temperature and inform your caregiver of this when you call.   There is vaginal spotting or bleeding. Notify your caregiver of the amount and how many pads are used.   You develop a bad smelling vaginal discharge with a change in the color.   You continue to feel sick to your stomach (nauseated) and have no relief from remedies suggested. You vomit blood or coffee ground-like materials.   You lose more than 2 pounds of weight in 1 week.   You gain more than 2 pounds of weight in 1 week and you notice swelling of your face, hands, feet, or legs.   You gain 5 pounds or more in 1 week (even if you do not have swelling of your hands, face, legs, or feet).   You get exposed to [place] measles and have never had them.   You are exposed to fifth disease or chickenpox.   You develop belly (abdominal) pain. Round ligament discomfort is a common non-cancerous (benign) cause of abdominal pain in pregnancy. Your caregiver still must evaluate this.   You develop headache, fever, diarrhea, pain with urination, or shortness of breath.   You fall or are in a car accident or have any kind of trauma.   There is mental or physical violence in  your home.  Document Released: 04/06/2001 Document Revised: 04/01/2011 Document Reviewed: 10/08/2008 [facility] Patient Information 2012 [place], [place]. Prenatal Care  WHAT IS PRENATAL CARE?  Prenatal care means health care during your pregnancy, before your baby is born. Take care of yourself and your baby by:   Getting  early prenatal care. If you know you are pregnant, or think you might be pregnant, call your caregiver as soon as possible. Schedule a visit for a general/prenatal examination.   Getting regular prenatal care. Follow your caregiver's schedule for blood and other necessary tests. Do not miss appointments.   Do everything you can to keep yourself and your baby healthy during your pregnancy.   Prenatal care should include evaluation of medical, dietary, educational, psychological, and social needs for the couple and the medical, surgical, and genetic history of the family of the mother and father.   Discuss with your caregiver:   Your medicines, prescription, over-the-counter, and herbal medicines.   Substance abuse, alcohol, smoking, and illegal drugs.   Domestic abuse and violence, if present.   Your immunizations.   Nutrition and diet.   Exercising.   Environment and occupational hazards, at home and at work.   History of sexually transmitted disease, both you and your partner.   Previous pregnancies.  WHY IS PRENATAL CARE SO IMPORTANT?  By seeing you regularly, your caregiver has the chance to find problems early, so that they can be treated as soon as possible. Other problems might be prevented. Many studies have shown that early and regular prenatal care is important for the health of both mothers and their babies.  I AM THINKING ABOUT GETTING PREGNANT. HOW CAN I TAKE CARE OF MYSELF?  Taking care of yourself before you get pregnant helps you to have a healthy pregnancy. It also lowers your chances of having a baby born with a birth defect. Here are ways to take  care of yourself before you get pregnant:   Eat healthy foods, exercise regularly (30 minutes per day for most days of the week is best), and get enough rest and sleep. Talk to your caregiver about what kinds of foods and exercises are best for you.   Take 400 micrograms (mcg) of folic acid (one of the B vitamins) every day. The best way to do this is to take a daily multivitamin pill that contains this amount of folic acid. Getting enough of the synthetic (manufactured) form of folic acid every day before you get pregnant and during early pregnancy can help prevent certain birth defects. Many breakfast cereals and other grain products have folic acid added to them, but only certain cereals contain 400 mcg of folic acid per serving. Check the label on your multivitamin or cereal to find the amount of folic acid in the food.   See your caregiver for a complete check up before getting pregnant. Make sure that you have had all your immunization shots, especially for rubella ([place] measles). Rubella can cause serious birth defects. Chickenpox is another illness you want to avoid during pregnancy. If you have had chickenpox and rubella in the past, you should be immune to them.   Tell your caregiver about any prescription or non-prescription medicines (including herbal remedies) you are taking. Some medicines are not safe to take during pregnancy.   Stop smoking cigarettes, drinking alcohol, or taking illegal drugs. Ask your caregiver for help, if you need it. You can also get help with alcohol and drugs by talking with a member of your faith community, a counselor, or a trusted friend.   Discuss and treat any medical, social, or psychological problems before getting pregnant.   Discuss any history of genetic problems in the mother, father, and their families. Do genetic testing before getting pregnant, when possible.   Discuss any physical  or emotional abuse with your caregiver.   Discuss with your  caregiver if you might be exposed to harmful chemicals on your job or where you live.   Discuss with your caregiver if you think your job or the hours you work may be harmful and should be changed.   The father should be involved with the decision making and with all aspects of the pregnancy, labor, and delivery.   If you have medical insurance, make sure you are covered for pregnancy.  I JUST FOUND OUT THAT I AM PREGNANT. HOW CAN I TAKE CARE OF MYSELF?  Here are ways to take care of yourself and the precious new life growing inside you:   Continue taking your multivitamin with 400 micrograms (mcg) of folic acid every day.   Get early and regular prenatal care. It does not matter if this is your first pregnancy or if you already have children. It is very important to see a caregiver during your pregnancy. Your caregiver will check at each visit to make sure that you and the baby are healthy. If there are any problems, action can be taken right away to help you and the baby.   Eat a healthy diet that includes:   Fruits.   Vegetables.   Foods low in saturated fat.   Grains.   Calcium-rich foods.   Drink 6 to 8 glasses of liquids a day.   Unless your caregiver tells you not to, try to be physically active for 30 minutes, most days of the week. If you are pressed for time, you can get your activity in through 10 minute segments, three times a day.   If you smoke, drink alcohol, or use drugs, STOP. These can cause long-term damage to your baby. Talk with your caregiver about steps to take to stop smoking. Talk with a member of your faith community, a counselor, a trusted friend, or your caregiver if you are concerned about your alcohol or drug use.   Ask your caregiver before taking any medicine, even over-the-counter medicines. Some medicines are not safe to take during pregnancy.   Get plenty of rest and sleep.   Avoid hot tubs and saunas during pregnancy.   Do not have X-rays  taken, unless absolutely necessary and with the recommendation of your caregiver. A lead shield can be placed on your abdomen, to protect the baby when X-rays are taken in other parts of the body.   Do not empty the cat litter when you are pregnant. It may contain a parasite that causes an infection called toxoplasmosis, which can cause birth defects. Also, use gloves when working in garden areas used by cats.   Do not eat uncooked or undercooked cheese, meats, or fish.   Stay away from toxic chemicals like:   Insecticides.   Solvents (some cleaners or paint thinners).   Lead.   Mercury.   Sexual relations may continue until the end of the pregnancy, unless you have a medical problem or there is a problem with the pregnancy and your caregiver tells you not to.   Do not wear high heel shoes, especially during the second half of the pregnancy. You can lose your balance and fall.   Do not take long trips, unless absolutely necessary. Be sure to see your caregiver before going on the trip.   Do not sit in one position for more than 2 hours, when on a trip.   Take a copy of your medical records when going  on a trip.   Know where there is a hospital in the city you are visiting, in case of an emergency.   Most dangerous household products will have pregnancy warnings on their labels. Ask your caregiver about products if you are unsure.   Limit or eliminate your caffeine intake from coffee, tea, sodas, medicines, and chocolate.   Many women continue working through pregnancy. Staying active might help you stay healthier. If you have a question about the safety or the hours you work at your particular job, talk with your caregiver.   Get informed:   Read books.   Watch videos.   Go to childbirth classes for you and the father.   Talk with experienced moms.   Ask your caregiver about childbirth education classes for you and your partner. Classes can help you and your partner  prepare for the birth of your baby.   Ask about a pediatrician (baby doctor) and methods and pain medicine for labor, delivery, and possible Cesarean delivery (C-section).  I AM NOT THINKING ABOUT GETTING PREGNANT RIGHT NOW, BUT HEARD THAT ALL WOMEN SHOULD TAKE FOLIC ACID EVERY DAY?  All women of childbearing age, with even a remote chance of getting pregnant, should try to make sure they get enough folic acid. Many pregnancies are not planned. Many women do not know they are actually pregnant early in their pregnancies, and certain birth defects happen in the very early part of pregnancy. Taking 400 micrograms (mcg) of folic acid every day will help prevent certain birth defects that happen in the early part of pregnancy. If a woman begins taking vitamin pills in the second or third month of pregnancy, it may be too late to prevent birth defects. Folic acid may also have other health benefits for women, besides preventing birth defects.  HOW OFTEN SHOULD I SEE MY CAREGIVER DURING PREGNANCY?  Your caregiver will give you a schedule for your prenatal visits. You will have visits more often as you get closer to the end of your pregnancy. An average pregnancy lasts about 40 weeks.  A typical schedule includes visiting your caregiver:   About once each month, during your first 6 months of pregnancy.   Every 2 weeks, during the next 2 months.   Weekly in the last month, until the delivery date.  Your caregiver will probably want to see you more often if:  You are over 35.   Your pregnancy is high risk, because you have certain health problems or problems with the pregnancy, such as:   Diabetes.   High blood pressure.   The baby is not growing on schedule, according to the dates of the pregnancy.  Your caregiver will do special tests, to make sure you and the baby are not having any serious problems. WHAT HAPPENS DURING PRENATAL VISITS?   At your first prenatal visit, your caregiver will talk  to you about you and your partner's health history and your family's health history, and will do a physical exam.   On your first visit, a physical exam will include checks of your blood pressure, height and weight, and an exam of your pelvic organs. Your caregiver will do a Pap test if you have not had one recently, and will do cultures of your cervix to make sure there is no infection.   At each visit, there will be tests of your blood, urine, blood pressure, weight, and checking the progress of the baby.   Your caregiver will be able to  tell you when to expect that your baby will be born.   Each visit is also a chance for you to learn about staying healthy during pregnancy and for asking questions.   Discuss whether you will be breastfeeding.   At your later prenatal visits, your caregiver will check how you are doing and how the baby is developing. You may have a number of tests done as your pregnancy progresses.   Ultrasound exams are often used to check on the baby's growth and health.   You may have more urine and blood tests, as well as special tests, if needed. These may include amniocentesis (examine fluid in the pregnancy sac), stress tests (check how baby responds to contractions), biophysical profile (measures fetus well-being). Your caregiver will explain the tests and why they are necessary.

## 2011-09-20 ENCOUNTER — Inpatient Hospital Stay (HOSPITAL_COMMUNITY)
Admission: AD | Admit: 2011-09-20 | Discharge: 2011-09-20 | Disposition: A | Discharge status: Home / Self Care | Payer: Medicaid Other | Source: Ambulatory Visit | Attending: Obstetrics & Gynecology | Admitting: Obstetrics & Gynecology

## 2011-09-20 ENCOUNTER — Encounter (HOSPITAL_COMMUNITY): Payer: Self-pay | Admitting: *Deleted

## 2011-09-20 DIAGNOSIS — O99891 Other specified diseases and conditions complicating pregnancy: Secondary | ICD-10-CM | POA: Insufficient documentation

## 2011-09-20 DIAGNOSIS — Z09 Encounter for follow-up examination after completed treatment for conditions other than malignant neoplasm: Secondary | ICD-10-CM

## 2011-09-20 DIAGNOSIS — R109 Unspecified abdominal pain: Secondary | ICD-10-CM | POA: Insufficient documentation

## 2011-09-20 NOTE — MA[facility] Provider Note (Signed)
  History     CSN: [id number]  Arrival date and time: 09/20/11 0755   None     Chief Complaint  Patient presents with  . Follow-up   HPI [name] is 21 y.o. G1P0 [redacted]w[redacted]d weeks presenting for repeat BHCG.  Was seen 5/25 with abdominal pain in early pregancy.  LMP 08/01/11.  +UPT, BHCG 288 and blood type O+ at last visit.  She denies vaginal bleeding and abdominal/pelvic pain today.      Past Medical History  Diagnosis Date  . Hypoglycemia   . Hypoglycemia   . Ovarian cyst 2011    Past Surgical History  Procedure Date  . Laparoscopic ovarian cystectomy 2011  . Eye surgery     2004    Family History  Problem Relation Age of Onset  . Anesthesia problems Neg Hx     History  Substance Use Topics  . Smoking status: Never Smoker   . Smokeless tobacco: Not on file  . Alcohol Use: No    Allergies: No Known Allergies  Prescriptions prior to admission  Medication Sig Dispense Refill  . prenatal vitamin w/FE, FA (PRENATAL 1 + 1) 27-1 MG TABS Take 1 tablet by mouth daily. Or prenatal vitamins covered by insurance with 27 mg iron  30 each  0    Review of Systems  Gastrointestinal: Negative for abdominal pain.  Genitourinary:       Neg for vaginal bleeding   Physical Exam   Last menstrual period 08/01/2011.  Physical Exam  Constitutional: She is oriented to person, place, and time. She appears well-developed and well-nourished.  HENT:  Head: Normocephalic.  Respiratory: Effort normal.  Neurological: She is alert and oriented to person, place, and time.  Psychiatric: She has a normal mood and affect. Her behavior is normal.    MAU Course  Procedures  MDM   Assessment and Plan  A:  Follow up BHCG  P:  Ultrasound scheduled for 1 week      Return heavy vaginal bleeding or pelvic pain  Florestine Carmical,EVE M 09/20/2011, 8:05 AM

## 2011-09-20 NOTE — Discharge Instructions (Signed)
Abdominal Pain During Pregnancy °Belly (abdominal) pain is common during pregnancy. Most of the time, it is not a serious problem. Other times, it can be a sign that something is wrong with the pregnancy. Always tell your doctor if you have belly pain. °HOME CARE °For mild pain: °· Do not have sex (intercourse) or put anything in your vagina until you feel better.  °· Rest until your pain stops. If your pain lasts longer than 1 hour, call your doctor.  °· Drink clear fluids if you feel sick to your stomach (nauseous).  °· Do not eat solid food until you feel better.  °· Only take medicine as told by your doctor.  °· Keep all doctor visits as told.  °GET HELP RIGHT AWAY IF:  °· You are bleeding, leaking fluid, or pieces of tissue come out of your vagina.  °· You have more pain or cramping.  °· You keep throwing up (vomiting).  °· You have pain when you pee (urinate) or have blood in your pee.  °· You have a fever.  °· You do not feel your baby moving as much.  °· You feel very weak or feel like passing out.  °· You have trouble breathing, with or without belly pain.  °· You have a very bad headache and belly pain.  °· You have fluid leaking from your vagina and belly pain.  °· You keep having watery poop (diarrhea).  °· Your belly pain does not go away after resting, or the pain gets worse.  °MAKE SURE YOU:  °· Understand these instructions.  °· Will watch your condition.  °· Will get help right away if you are not doing well or get worse.  °Document Released: 03/31/2009 Document Revised: 04/01/2011 Document Reviewed: 11/06/2010 °[employer]® Patient Information ©2012 [employer]. °

## 2011-09-23 ENCOUNTER — Inpatient Hospital Stay (HOSPITAL_COMMUNITY): Payer: Medicaid Other

## 2011-09-23 ENCOUNTER — Encounter (HOSPITAL_COMMUNITY): Payer: Self-pay | Admitting: *Deleted

## 2011-09-23 ENCOUNTER — Inpatient Hospital Stay (HOSPITAL_COMMUNITY)
Admission: AD | Admit: 2011-09-23 | Discharge: 2011-09-23 | Disposition: A | Discharge status: Home / Self Care | Payer: Medicaid Other | Source: Ambulatory Visit | Attending: Obstetrics & Gynecology | Admitting: Obstetrics & Gynecology

## 2011-09-23 DIAGNOSIS — O99891 Other specified diseases and conditions complicating pregnancy: Secondary | ICD-10-CM | POA: Insufficient documentation

## 2011-09-23 DIAGNOSIS — R1084 Generalized abdominal pain: Secondary | ICD-10-CM

## 2011-09-23 DIAGNOSIS — R109 Unspecified abdominal pain: Secondary | ICD-10-CM | POA: Insufficient documentation

## 2011-09-23 LAB — URINALYSIS, ROUTINE W REFLEX MICROSCOPIC
Bilirubin Urine: NEGATIVE
Hgb urine dipstick: NEGATIVE
Ketones, ur: NEGATIVE mg/dL
Nitrite: NEGATIVE
pH: 6 (ref 5.0–8.0)

## 2011-09-23 LAB — DIFFERENTIAL
Basophils Relative: 0 % (ref 0–1)
Eosinophils Absolute: 0.1 10*3/uL (ref 0.0–0.7)
Eosinophils Relative: 1 % (ref 0–5)
Monocytes Absolute: 0.5 10*3/uL (ref 0.1–1.0)
Monocytes Relative: 7 % (ref 3–12)
Neutrophils Relative %: 71 % (ref 43–77)

## 2011-09-23 LAB — CBC
Hemoglobin: 13.1 g/dL (ref 12.0–15.0)
MCH: 28.1 pg (ref 26.0–34.0)
MCHC: 34.7 g/dL (ref 30.0–36.0)
MCV: 80.9 fL (ref 78.0–100.0)

## 2011-09-23 MED ORDER — POLYETHYLENE GLYCOL 3350 17 GM/SCOOP PO POWD: 17.0000 g | Freq: Two times a day (BID) | ORAL | Status: AC | PRN

## 2011-09-23 MED ORDER — DOCUSATE SODIUM 100 MG PO CAPS: 200.0000 mg | ORAL_CAPSULE | Freq: Every day | ORAL | Status: AC

## 2011-09-23 MED ORDER — PRENATAL VITAMINS (DIS) PO TABS: 1.0000 | ORAL_TABLET | Freq: Every day | ORAL | Status: DC

## 2011-09-23 NOTE — MAU Note (Signed)
Patient states she is having sharp lower abdominal pain since this am. Denies bleeding or discharge.

## 2011-09-23 NOTE — Discharge Instructions (Signed)
Follow up at [facility] for your new OB appointment in 2 weeks.   Please take Colace and Miralax to help with any constipation that may be contributing to your symptoms.    Abdominal Pain During Pregnancy Abdominal discomfort is common in pregnancy. Most of the time, it does not cause harm. There are many causes of abdominal pain. Some causes are more serious than others. Some of the causes of abdominal pain in pregnancy are easily diagnosed. Occasionally, the diagnosis takes time to understand. Other times, the cause is not determined. Abdominal pain can be a sign that something is very wrong with the pregnancy, or the pain may have nothing to do with the pregnancy at all. For this reason, always tell your caregiver if you have any abdominal discomfort. CAUSES Common and harmless causes of abdominal pain include:  Constipation.   Excess gas and bloating.   Round ligament pain. This is pain that is felt in the folds of the groin.   The position the baby or placenta is in.   Baby kicks.   Braxton-Hicks contractions. These are mild contractions that do not cause cervical dilation.  Serious causes of abdominal pain include:  Ectopic pregnancy. This happens when a fertilized egg implants outside of the uterus.   Miscarriage.   Preterm labor. This is when labor starts at less than 37 weeks of pregnancy.   Placental abruption. This is when the placenta partially or completely separates from the uterus.   Preeclampsia. This is often associated with high blood pressure and has been referred to as "toxemia in pregnancy."   Uterine or amniotic fluid infections.  Causes unrelated to pregnancy include:  Urinary tract infection.   Gallbladder stones or inflammation.   Hepatitis or other liver illness.   Intestinal problems, stomach flu, food poisoning, or ulcer.   Appendicitis.   Kidney (renal) stones.   Kidney infection (pylonephritis).  HOME CARE  INSTRUCTIONS  For mild pain:  Do not have sexual intercourse or put anything in your vagina until your symptoms go away completely.   Get plenty of rest until your pain improves. If your pain does not improve in 1 hour, call your caregiver.   Drink clear fluids if you feel nauseous. Avoid solid food as long as you are uncomfortable or nauseous.   Only take medicine as directed by your caregiver.   Keep all follow-up appointments with your caregiver.  SEEK IMMEDIATE MEDICAL CARE IF:  You are bleeding, leaking fluid, or passing tissue from the vagina.   You have increasing pain or cramping.   You have persistent vomiting.   You have painful or bloody urination.   You have a fever.   You notice a decrease in your baby's movements.   You have extreme weakness or feel faint.   You have shortness of breath, with or without abdominal pain.   You develop a severe headache with abdominal pain.   You have abnormal vaginal discharge with abdominal pain.   You have persistent diarrhea.   You have abdominal pain that continues even after rest, or gets worse.  MAKE SURE YOU:   Understand these instructions.   Will watch your condition.   Will get help right away if you are not doing well or get worse.  Document Released: 04/12/2005 Document Revised: 04/01/2011 Document Reviewed: 11/06/2010 [facility] Patient Information 2012 [place], [place].

## 2011-09-23 NOTE — MAU Provider Note (Signed)
History     CSN: [id number]  Arrival date and time: 09/23/11 4098   First Provider Initiated Contact with Patient 09/23/11 [phone]      Chief Complaint  Patient presents with  . Abdominal Pain   HPI Comments: Pt recently seen in MAU X 3 prior visits in last 10 days.  1st visit with negative pregnancy test.  2nd with + pregnancy test; 3rd for serial bHCG that was normal.  US performed on 5/20 that showed small collection of fluid in endometrium.  Non repeated since that time.    Pain reported over RLQ but not pelvic in nature.  This is a different pain than her prior evaluations.  Pain is non-specific but pt reports it moves to R & L flank.  No fevers, no chills.  Normal WBC on 5/26 and Normal UA on 5/20 and 5/25.     Abdominal Pain This is a new problem. The current episode started today. The onset quality is sudden. The problem occurs intermittently. The most recent episode lasted 4 hours. The problem has been resolved. The pain is located in the RLQ. The pain is severe. The quality of the pain is sharp. The abdominal pain radiates to the right flank and left flank. Associated symptoms include anorexia and nausea. Pertinent negatives include no arthralgias, constipation, diarrhea, dysuria, fever, frequency, headaches, hematuria or vomiting. The pain is aggravated by certain positions. The pain is relieved by nothing. She has tried nothing for the symptoms. Prior workup: prior sx for hemorrhagic ovarian cyst. Her past medical history is significant for abdominal surgery. There is no history of colon cancer, Crohn's disease, gallstones, GERD, irritable bowel syndrome, pancreatitis, PUD or ulcerative colitis.  Patient is a 21 y.o. female presenting with abdominal pain.  Abdominal Pain The primary symptoms of the illness include abdominal pain and nausea. The primary symptoms of the illness do not include fever, shortness of breath, vomiting, diarrhea or dysuria. The current episode started today.  The onset of the illness was sudden. The problem has been resolved.  The abdominal pain radiates to the right flank and left flank.  Additional symptoms associated with the illness include anorexia and back pain. Symptoms associated with the illness do not include constipation, hematuria or frequency. Significant associated medical issues do not include PUD, GERD or gallstones.    OB History    Grav Para Term Preterm Abortions TAB SAB Ect Mult Living   1               Past Medical History  Diagnosis Date  . Hypoglycemia   . Hypoglycemia   . Ovarian cyst 2011    Past Surgical History  Procedure Date  . Laparoscopic ovarian cystectomy 2011  . Eye surgery     2004    Family History  Problem Relation Age of Onset  . Anesthesia problems Neg Hx   . Hearing loss Neg Hx   . Cancer Paternal Grandmother     breast    History  Substance Use Topics  . Smoking status: Never Smoker   . Smokeless tobacco: Never Used  . Alcohol Use: No    Allergies: No Known Allergies  No prescriptions prior to admission    Review of Systems  Constitutional: Negative for fever.  HENT: Negative for congestion.   Respiratory: Negative for cough, shortness of breath and wheezing.   Cardiovascular: Negative for leg swelling.  Gastrointestinal: Positive for nausea, abdominal pain and anorexia. Negative for vomiting, diarrhea and constipation.  Genitourinary:  Negative for dysuria, frequency and hematuria.  Musculoskeletal: Positive for back pain. Negative for arthralgias.  Neurological: Negative.  Negative for headaches.  Endo/Heme/Allergies: Negative.   Psychiatric/Behavioral: Negative.    Physical Exam   Blood pressure 119/73, pulse 84, temperature 99.1 F (37.3 C), temperature source Oral, resp. rate 16, height 5' 2.5" (1.588 m), weight 53.887 kg (118 lb 12.8 oz), last menstrual period 08/01/2011, SpO2 100.00%.  Physical Exam  Nursing note and vitals reviewed. Constitutional: She appears  well-developed and well-nourished. No distress.  HENT:  Head: Normocephalic and atraumatic.  Eyes: Conjunctivae are normal. Right eye exhibits no discharge. Left eye exhibits no discharge. No scleral icterus.  Cardiovascular: Normal rate, regular rhythm, normal heart sounds and intact distal pulses.  Exam reveals no gallop and no friction rub.   No murmur heard. Respiratory: Effort normal and breath sounds normal. No respiratory distress. She has no wheezes. She has no rales. She exhibits no tenderness.  GI: Soft. She exhibits no distension and no mass. There is tenderness. There is no rebound and no guarding.       Tenderness over McBurney's point.  No rebound, no rigidity, non-tympanic.  Non-peritonitic  Genitourinary:       Deferred as no new complaints and recent exam  Musculoskeletal: Normal range of motion.  Skin: Skin is warm and dry. No rash noted. She is not diaphoretic. No erythema. No pallor.  Psychiatric: She has a normal mood and affect. Her behavior is normal. Judgment and thought content normal.    MAU Course  Procedures Results for orders placed during the hospital encounter of 09/23/11 (from the past 24 hour(s))  URINALYSIS, ROUTINE W REFLEX MICROSCOPIC     Status: Normal   Collection Time   09/23/11  9:10 AM      Component Value Range   Color, Urine YELLOW  YELLOW    APPearance CLEAR  CLEAR    Specific Gravity, Urine 1.010  1.005 - 1.030    pH 6.0  5.0 - 8.0    Glucose, UA NEGATIVE  NEGATIVE (mg/dL)   Hgb urine dipstick NEGATIVE  NEGATIVE    Bilirubin Urine NEGATIVE  NEGATIVE    Ketones, ur NEGATIVE  NEGATIVE (mg/dL)   Protein, ur NEGATIVE  NEGATIVE (mg/dL)   Urobilinogen, UA 0.2  0.0 - 1.0 (mg/dL)   Nitrite NEGATIVE  NEGATIVE    Leukocytes, UA NEGATIVE  NEGATIVE   CBC     Status: Normal   Collection Time   09/23/11 10:05 AM      Component Value Range   WBC 6.8  4.0 - 10.5 (K/uL)   RBC 4.66  3.87 - 5.11 (MIL/uL)   Hemoglobin 13.1  12.0 - 15.0 (g/dL)   HCT  16.1  09.6 - 04.5 (%)   MCV 80.9  78.0 - 100.0 (fL)   MCH 28.1  26.0 - 34.0 (pg)   MCHC 34.7  30.0 - 36.0 (g/dL)   RDW 40.9  81.1 - 91.4 (%)   Platelets 232  150 - 400 (K/uL)  DIFFERENTIAL     Status: Normal   Collection Time   09/23/11 10:05 AM      Component Value Range   Neutrophils Relative 71  43 - 77 (%)   Neutro Abs 4.8  1.7 - 7.7 (K/uL)   Lymphocytes Relative 21  12 - 46 (%)   Lymphs Abs 1.4  0.7 - 4.0 (K/uL)   Monocytes Relative 7  3 - 12 (%)   Monocytes Absolute 0.5  0.1 -  1.0 (K/uL)   Eosinophils Relative 1  0 - 5 (%)   Eosinophils Absolute 0.1  0.0 - 0.7 (K/uL)   Basophils Relative 0  0 - 1 (%)   Basophils Absolute 0.0  0.0 - 0.1 (K/uL)  HCG, QUANTITATIVE, PREGNANCY     Status: Abnormal   Collection Time   09/23/11 10:05 AM      Component Value Range   hCG, Beta Chain, Quant, S 2049 (*) <5 (mIU/mL)   US Ob Transvaginal 09/23/2011 - Intrauterine pregnancy - Dated to    MDM Seen multiple times in past 10 days for abdominal/pelvic pain.   [place] reassuring for non-ectopic. Pt afebrile, without white count without signs of peritonitis - even in setting of + Mcburney's low likelihood of appendicitis  UA without evidence of UTI Serial b-hcg's demonstrate appropriate doubling  Assessment and Plan  Normal Intrauterine pregnancy estimated at [redacted]w[redacted]d with EDD of 05/26/2012 - discordinate with LMP Abdominal pain.  Likely constipation/bloating related.  No history of obstipation - Will Rx miralax and colace to continue through pregnancy.  Titrate to 1 loose stool per day Follow up with [facility] for OB care.  Case reviewed with Kerrie Buffalo, NP and pt seen by provider.  Andrena Mews, DO [facility] Resident - PGY-1 09/23/2011 11:35 AM  I have evaluated this patient with the Resident and agree with assessment and plan of care.  I have reviewed this patient's vital signs, nurses notes, appropriate labs and imaging.  I have discussed with the patient in detail abdominal  pain in pregnancy and need for return if increased pain, persistent vomiting, fever or other problems. Patient is to follow up with the [facility] for prenatal care.

## 2011-09-24 ENCOUNTER — Telehealth: Payer: Self-pay | Admitting: *Deleted

## 2011-09-24 ENCOUNTER — Other Ambulatory Visit: Payer: Self-pay | Admitting: Family Medicine

## 2011-09-24 DIAGNOSIS — O3680X0 Pregnancy with inconclusive fetal viability, not applicable or unspecified: Secondary | ICD-10-CM

## 2011-09-24 NOTE — MAU Provider Note (Signed)
Pt should get [place] in 10 days for viability.  Please order as outpt through [facility] clinic.

## 2011-09-24 NOTE — Telephone Encounter (Signed)
Called and lvm informing pt of following appt:  Tuesday, June 11,2013 @ 915 AM [facility] [phone].Laureen Ochs, Viann Shove

## 2011-09-28 ENCOUNTER — Other Ambulatory Visit: Payer: Self-pay | Admitting: Family Medicine

## 2011-09-28 DIAGNOSIS — Z3490 Encounter for supervision of normal pregnancy, unspecified, unspecified trimester: Secondary | ICD-10-CM

## 2011-09-29 ENCOUNTER — Telehealth: Payer: Self-pay | Admitting: *Deleted

## 2011-09-29 NOTE — Telephone Encounter (Signed)
[name] @ [facility] calling to clarify patient's U/S orders.  Patient has orders for an abdomen complete and a transvaginal U/S.  Needs clarification on which one patient needs to have done.

## 2011-09-29 NOTE — Progress Notes (Signed)
Spoke with Victorino Dike in radiology and pt does NOT need a abd complete only a transvaginal. Victorino Dike will cancel the order for the complete US.Loralee Pacas [place]

## 2011-10-05 ENCOUNTER — Inpatient Hospital Stay (HOSPITAL_COMMUNITY)
Admission: AD | Admit: 2011-10-05 | Discharge: 2011-10-05 | Disposition: A | Discharge status: Home / Self Care | Payer: Self-pay | Source: Ambulatory Visit | Attending: Family Medicine | Admitting: Family Medicine

## 2011-10-05 ENCOUNTER — Encounter (HOSPITAL_COMMUNITY): Payer: Self-pay | Admitting: *Deleted

## 2011-10-05 ENCOUNTER — Ambulatory Visit (HOSPITAL_COMMUNITY)
Admission: RE | Admit: 2011-10-05 | Discharge: 2011-10-05 | Disposition: A | Discharge status: Home / Self Care | Payer: Medicaid Other | Source: Ambulatory Visit | Attending: Family Medicine | Admitting: Family Medicine

## 2011-10-05 DIAGNOSIS — O3680X0 Pregnancy with inconclusive fetal viability, not applicable or unspecified: Secondary | ICD-10-CM | POA: Insufficient documentation

## 2011-10-05 DIAGNOSIS — Z331 Pregnant state, incidental: Secondary | ICD-10-CM

## 2011-10-05 DIAGNOSIS — Z3490 Encounter for supervision of normal pregnancy, unspecified, unspecified trimester: Secondary | ICD-10-CM

## 2011-10-05 DIAGNOSIS — Z3689 Encounter for other specified antenatal screening: Secondary | ICD-10-CM | POA: Insufficient documentation

## 2011-10-05 NOTE — MAU Provider Note (Signed)
  History     CSN: [id number]  Arrival date and time: 10/05/11 1059   First Provider Initiated Contact with Patient 10/05/11 1135      Chief Complaint  Patient presents with  . Wound Check   HPI Patient seen for follow up viability [place].  US showed viable pregnancy with heart rate with EDD 05/26/12 and EGA 6.5 weeks.  Patient having mild cramping, no spotting.  OB History    Grav Para Term Preterm Abortions TAB SAB Ect Mult Living   1               Past Medical History  Diagnosis Date  . Hypoglycemia   . Hypoglycemia   . Ovarian cyst 2011    Past Surgical History  Procedure Date  . Laparoscopic ovarian cystectomy 2011  . Eye surgery     2004    Family History  Problem Relation Age of Onset  . Anesthesia problems Neg Hx   . Hearing loss Neg Hx   . Cancer Paternal Grandmother     breast    History  Substance Use Topics  . Smoking status: Never Smoker   . Smokeless tobacco: Never Used  . Alcohol Use: No    Allergies: No Known Allergies  Prescriptions prior to admission  Medication Sig Dispense Refill  . Prenatal Vitamins (DIS) TABS Take 1 tablet by mouth daily.  90 tablet  3    ROS Physical Exam   Blood pressure 124/79, pulse 79, temperature 98.4 F (36.9 C), temperature source Oral, resp. rate 18, height 5\' 3"  (1.6 m), weight 54.885 kg (121 lb), last menstrual period 08/01/2011.  Physical Exam  Constitutional: She is oriented to person, place, and time. She appears well-developed and well-nourished.  Neurological: She is alert and oriented to person, place, and time.  Skin: Skin is warm and dry.  Psychiatric: She has a normal mood and affect. Her behavior is normal. Judgment and thought content normal.    MAU Course  Procedures    Assessment and Plan  1.  Viable pregnancy.  Pregnancy letter given.  Patient to follow up with primary OB.  Mickael Mcnutt JEHIEL 10/05/2011, 11:37 AM

## 2011-10-05 NOTE — MAU Note (Signed)
Rollover from [place], viability.  States saw big sack and babies heart beat.

## 2011-10-05 NOTE — Discharge Instructions (Signed)
Pregnancy - First Trimester  During sexual intercourse, millions of sperm go into the vagina. Only 1 sperm will penetrate and fertilize the female egg while it is in the Fallopian tube. One week later, the fertilized egg implants into the wall of the uterus. An embryo begins to develop into a baby. At 6 to 8 weeks, the eyes and face are formed and the heartbeat can be seen on ultrasound. At the end of 12 weeks (first trimester), all the baby's organs are formed. Now that you are pregnant, you will want to do everything you can to have a healthy baby. Two of the most important things are to get good prenatal care and follow your caregiver's instructions. Prenatal care is all the medical care you receive before the baby's birth. It is given to prevent, find, and treat problems during the pregnancy and childbirth.  PRENATAL EXAMS   During prenatal visits, your weight, blood pressure and urine are checked. This is done to make sure you are healthy and progressing normally during the pregnancy.   A pregnant woman should gain 25 to 35 pounds during the pregnancy. However, if you are over weight or underweight, your caregiver will advise you regarding your weight.   Your caregiver will ask and answer questions for you.   Blood work, cervical cultures, other necessary tests and a Pap test are done during your prenatal exams. These tests are done to check on your health and the probable health of your baby. Tests are strongly recommended and done for HIV with your permission. This is the virus that causes AIDS. These tests are done because medications can be given to help prevent your baby from being born with this infection should you have been infected without knowing it. Blood work is also used to find out your blood type, previous infections and follow your blood levels (hemoglobin).   Low hemoglobin (anemia) is common during pregnancy. Iron and vitamins are given to help prevent this. Later in the pregnancy, blood  tests for diabetes will be done along with any other tests if any problems develop. You may need tests to make sure you and the baby are doing well.   You may need other tests to make sure you and the baby are doing well.  CHANGES DURING THE FIRST TRIMESTER (THE FIRST 3 MONTHS OF PREGNANCY)  Your body goes through many changes during pregnancy. They vary from person to person. Talk to your caregiver about changes you notice and are concerned about. Changes can include:   Your menstrual period stops.   The egg and sperm carry the genes that determine what you look like. Genes from you and your partner are forming a baby. The female genes determine whether the baby is a boy or a girl.   Your body increases in girth and you may feel bloated.   Feeling sick to your stomach (nauseous) and throwing up (vomiting). If the vomiting is uncontrollable, call your caregiver.   Your breasts will begin to enlarge and become tender.   Your nipples may stick out more and become darker.   The need to urinate more. Painful urination may mean you have a bladder infection.   Tiring easily.   Loss of appetite.   Cravings for certain kinds of food.   At first, you may gain or lose a couple of pounds.   You may have changes in your emotions from day to day (excited to be pregnant or concerned something may go wrong with   the pregnancy and baby).   You may have more vivid and strange dreams.  HOME CARE INSTRUCTIONS    It is very important to avoid all smoking, alcohol and un-prescribed drugs during your pregnancy. These affect the formation and growth of the baby. Avoid chemicals while pregnant to ensure the delivery of a healthy infant.   Start your prenatal visits by the 12th week of pregnancy. They are usually scheduled monthly at first, then more often in the last 2 months before delivery. Keep your caregiver's appointments. Follow your caregiver's instructions regarding medication use, blood and lab tests, exercise, and  diet.   During pregnancy, you are providing food for you and your baby. Eat regular, well-balanced meals. Choose foods such as meat, fish, milk and other low fat dairy products, vegetables, fruits, and whole-grain breads and cereals. Your caregiver will tell you of the ideal weight gain.   You can help morning sickness by keeping soda crackers at the bedside. Eat a couple before arising in the morning. You may want to use the crackers without salt on them.   Eating 4 to 5 small meals rather than 3 large meals a day also may help the nausea and vomiting.   Drinking liquids between meals instead of during meals also seems to help nausea and vomiting.   A physical sexual relationship may be continued throughout pregnancy if there are no other problems. Problems may be early (premature) leaking of amniotic fluid from the membranes, vaginal bleeding, or belly (abdominal) pain.   Exercise regularly if there are no restrictions. Check with your caregiver or physical therapist if you are unsure of the safety of some of your exercises. Greater weight gain will occur in the last 2 trimesters of pregnancy. Exercising will help:   Control your weight.   Keep you in shape.   Prepare you for labor and delivery.   Help you lose your pregnancy weight after you deliver your baby.   Wear a good support or jogging bra for breast tenderness during pregnancy. This may help if worn during sleep too.   Ask when prenatal classes are available. Begin classes when they are offered.   Do not use hot tubs, steam rooms or saunas.   Wear your seat belt when driving. This protects you and your baby if you are in an accident.   Avoid raw meat, uncooked cheese, cat litter boxes and soil used by cats throughout the pregnancy. These carry germs that can cause birth defects in the baby.   The first trimester is a good time to visit your dentist for your dental health. Getting your teeth cleaned is OK. Use a softer toothbrush and brush  gently during pregnancy.   Ask for help if you have financial, counseling or nutritional needs during pregnancy. Your caregiver will be able to offer counseling for these needs as well as refer you for other special needs.   Do not take any medications or herbs unless told by your caregiver.   Inform your caregiver if there is any mental or physical domestic violence.   Make a list of emergency phone numbers of family, friends, hospital, and police and fire departments.   Write down your questions. Take them to your prenatal visit.   Do not douche.   Do not cross your legs.   If you have to stand for long periods of time, rotate you feet or take small steps in a circle.   You may have more vaginal secretions that may   require a sanitary pad. Do not use tampons or scented sanitary pads.  MEDICATIONS AND DRUG USE IN PREGNANCY   Take prenatal vitamins as directed. The vitamin should contain 1 milligram of folic acid. Keep all vitamins out of reach of children. Only a couple vitamins or tablets containing iron may be fatal to a baby or young child when ingested.   Avoid use of all medications, including herbs, over-the-counter medications, not prescribed or suggested by your caregiver. Only take over-the-counter or prescription medicines for pain, discomfort, or fever as directed by your caregiver. Do not use aspirin, ibuprofen, or naproxen unless directed by your caregiver.   Let your caregiver also know about herbs you may be using.   Alcohol is related to a number of birth defects. This includes fetal alcohol syndrome. All alcohol, in any form, should be avoided completely. Smoking will cause low birth rate and premature babies.   Street or illegal drugs are very harmful to the baby. They are absolutely forbidden. A baby born to an addicted mother will be addicted at birth. The baby will go through the same withdrawal an adult does.   Let your caregiver know about any medications that you have to take  and for what reason you take them.  MISCARRIAGE IS COMMON DURING PREGNANCY  A miscarriage does not mean you did something wrong. It is not a reason to worry about getting pregnant again. Your caregiver will help you with questions you may have. If you have a miscarriage, you may need minor surgery.  SEEK MEDICAL CARE IF:   You have any concerns or worries during your pregnancy. It is better to call with your questions if you feel they cannot wait, rather than worry about them.  SEEK IMMEDIATE MEDICAL CARE IF:    An unexplained oral temperature above 102 F (38.9 C) develops, or as your caregiver suggests.   You have leaking of fluid from the vagina (birth canal). If leaking membranes are suspected, take your temperature and inform your caregiver of this when you call.   There is vaginal spotting or bleeding. Notify your caregiver of the amount and how many pads are used.   You develop a bad smelling vaginal discharge with a change in the color.   You continue to feel sick to your stomach (nauseated) and have no relief from remedies suggested. You vomit blood or coffee ground-like materials.   You lose more than 2 pounds of weight in 1 week.   You gain more than 2 pounds of weight in 1 week and you notice swelling of your face, hands, feet, or legs.   You gain 5 pounds or more in 1 week (even if you do not have swelling of your hands, face, legs, or feet).   You get exposed to German measles and have never had them.   You are exposed to fifth disease or chickenpox.   You develop belly (abdominal) pain. Round ligament discomfort is a common non-cancerous (benign) cause of abdominal pain in pregnancy. Your caregiver still must evaluate this.   You develop headache, fever, diarrhea, pain with urination, or shortness of breath.   You fall or are in a car accident or have any kind of trauma.   There is mental or physical violence in your home.  Document Released: 04/06/2001 Document Revised: 04/01/2011  Document Reviewed: 10/08/2008  [employer] Patient Information 2012 [employer].

## 2011-10-12 ENCOUNTER — Inpatient Hospital Stay (HOSPITAL_COMMUNITY)
Admission: AD | Admit: 2011-10-12 | Discharge: 2011-10-12 | Disposition: A | Discharge status: Home / Self Care | Payer: Medicaid Other | Source: Ambulatory Visit | Attending: Family Medicine | Admitting: Family Medicine

## 2011-10-12 ENCOUNTER — Encounter (HOSPITAL_COMMUNITY): Payer: Self-pay

## 2011-10-12 DIAGNOSIS — R109 Unspecified abdominal pain: Secondary | ICD-10-CM | POA: Insufficient documentation

## 2011-10-12 DIAGNOSIS — O99891 Other specified diseases and conditions complicating pregnancy: Secondary | ICD-10-CM | POA: Insufficient documentation

## 2011-10-12 DIAGNOSIS — K5900 Constipation, unspecified: Secondary | ICD-10-CM | POA: Insufficient documentation

## 2011-10-12 HISTORY — DX: Constipation, unspecified: K59.00

## 2011-10-12 MED ORDER — POLYETHYLENE GLYCOL 3350 17 G PO PACK
17.0000 g | PACK | Freq: Once | ORAL | Status: AC
  Administered 2011-10-12: 17 g via ORAL
  Filled 2011-10-12: qty 1

## 2011-10-12 MED ORDER — POLYETHYLENE GLYCOL 3350 17 G PO PACK: 17.0000 g | PACK | Freq: Every day | ORAL | Status: AC | PRN

## 2011-10-12 MED ORDER — PSYLLIUM 0.52 G PO CAPS: 0.5200 g | ORAL_CAPSULE | Freq: Every day | ORAL | Status: DC

## 2011-10-12 MED ORDER — PROMETHAZINE HCL 25 MG PO TABS
25.0000 mg | ORAL_TABLET | Freq: Once | ORAL | Status: AC
  Administered 2011-10-12: 25 mg via ORAL
  Filled 2011-10-12: qty 1

## 2011-10-12 NOTE — MAU Note (Signed)
Patient is in with c/o sudden lower abdominal sharp pain that started last night. She states that the pain feels like she has gas. She feels like she is constipated. She has not gotten medicine that was given to her because she does not have medicaid.

## 2011-10-12 NOTE — MAU Provider Note (Signed)
History     CSN: [id number]  Arrival date and time: 10/12/11 1478   First Provider Initiated Contact with Patient 10/12/11 [phone]      Chief Complaint  Patient presents with  . Abdominal Pain  . Constipation   HPI This is a 21 y.o. female at [redacted]w[redacted]d who presents with c/o intermittent colicky pain since last night. States had a BM yesterday but was hard. Has longstanding issues with constipation. Did not take anything. States pain feels like gas. No bleeding. Was seen recently ( week ago) and had US showing viable IUP. Has not sought [facility] yet, though does go to [facility] for primary care.  Has seen [facility] in past for Ovarian cystectomy.   RN Note: Patient is in with c/o sudden lower abdominal sharp pain that started last night. She states that the pain feels like she has gas. She feels like she is constipated. She has not gotten medicine that was given to her because she does not have medicaid  OB History    Grav Para Term Preterm Abortions TAB SAB Ect Mult Living   1               Past Medical History  Diagnosis Date  . Hypoglycemia   . Hypoglycemia   . Ovarian cyst 2011  . Constipation     Past Surgical History  Procedure Date  . Laparoscopic ovarian cystectomy 2011  . Eye surgery     2004    Family History  Problem Relation Age of Onset  . Anesthesia problems Neg Hx   . Hearing loss Neg Hx   . Cancer Paternal Grandmother     breast    History  Substance Use Topics  . Smoking status: Never Smoker   . Smokeless tobacco: Never Used  . Alcohol Use: No    Allergies: No Known Allergies  Prescriptions prior to admission  Medication Sig Dispense Refill  . acetaminophen (TYLENOL) 325 MG tablet Take 650 mg by mouth daily as needed. For pain      . Prenatal Vit-Fe Fumarate-FA (PRENATAL MULTIVITAMIN) TABS Take 1 tablet by mouth daily.        ROS As listed in HPI  Physical Exam   Blood pressure 104/53, pulse 80, temperature 97.8 F (36.6 C), temperature source Oral,  resp. rate 18, height 5\' 3"  (1.6 m), weight 120 lb 8 oz (54.658 kg), last menstrual period 08/01/2011.  Physical Exam  Constitutional: She is oriented to person, place, and time. She appears well-developed and well-nourished. No distress.  HENT:  Head: Normocephalic.  Cardiovascular: Normal rate.   Respiratory: Effort normal.  GI: Soft. She exhibits no distension and no mass. There is no tenderness. There is no rebound and no guarding.  Genitourinary:       Pelvic exam deferred Attempted to see FHR with bedside US but too small.   Musculoskeletal: Normal range of motion.  Neurological: She is alert and oriented to person, place, and time.  Skin: Skin is warm and dry.  Psychiatric: She has a normal mood and affect.    MAU Course  Procedures  MDM Miralax given.  >> Also gave Phenergan  >> Pt feels better and wants to go home   Assessment and Plan  A:  SIUP at [redacted]w[redacted]d        Constipation  P:  Rx Miralax for PRN use       Rx Metamucil for daily preventative use       Encouraged to seek [facility]  [facility] 10/12/2011, 11:19 AM

## 2011-10-12 NOTE — MAU Provider Note (Signed)
Chart reviewed and agree with management and plan.  

## 2011-10-12 NOTE — Discharge Instructions (Signed)

## 2011-10-14 ENCOUNTER — Encounter (HOSPITAL_COMMUNITY): Payer: Self-pay | Admitting: Emergency Medicine

## 2011-10-14 ENCOUNTER — Emergency Department (HOSPITAL_COMMUNITY)
Admission: EM | Admit: 2011-10-14 | Discharge: 2011-10-14 | Discharge status: Against Medical Advice | Payer: Medicaid Other | Attending: Emergency Medicine | Admitting: Emergency Medicine

## 2011-10-14 DIAGNOSIS — N898 Other specified noninflammatory disorders of vagina: Secondary | ICD-10-CM | POA: Insufficient documentation

## 2011-10-14 LAB — URINE MICROSCOPIC-ADD ON

## 2011-10-14 LAB — URINALYSIS, ROUTINE W REFLEX MICROSCOPIC
Bilirubin Urine: NEGATIVE
Glucose, UA: NEGATIVE mg/dL
Ketones, ur: NEGATIVE mg/dL
Protein, ur: NEGATIVE mg/dL
pH: 6 (ref 5.0–8.0)

## 2011-10-14 NOTE — ED Notes (Addendum)
PT. REPORTS SPOTTING / LOW ABDOMINAL CRAMPING ONSET THIS EVENING , STATES [redacted] WEEKS PREGNANT , G1P0 , NO PRENATAL CARE. DENIES FEVER OR CHILLS.

## 2011-10-19 ENCOUNTER — Inpatient Hospital Stay (HOSPITAL_COMMUNITY): Payer: Medicaid Other

## 2011-10-19 ENCOUNTER — Encounter (HOSPITAL_COMMUNITY): Payer: Self-pay | Admitting: *Deleted

## 2011-10-19 ENCOUNTER — Inpatient Hospital Stay (HOSPITAL_COMMUNITY)
Admission: AD | Admit: 2011-10-19 | Discharge: 2011-10-19 | Disposition: A | Discharge status: Home / Self Care | Payer: Medicaid Other | Source: Ambulatory Visit | Attending: Emergency Medicine | Admitting: Emergency Medicine

## 2011-10-19 DIAGNOSIS — R1031 Right lower quadrant pain: Secondary | ICD-10-CM | POA: Insufficient documentation

## 2011-10-19 DIAGNOSIS — O99891 Other specified diseases and conditions complicating pregnancy: Secondary | ICD-10-CM | POA: Insufficient documentation

## 2011-10-19 DIAGNOSIS — Z331 Pregnant state, incidental: Secondary | ICD-10-CM

## 2011-10-19 DIAGNOSIS — R1084 Generalized abdominal pain: Secondary | ICD-10-CM

## 2011-10-19 DIAGNOSIS — O210 Mild hyperemesis gravidarum: Secondary | ICD-10-CM | POA: Insufficient documentation

## 2011-10-19 DIAGNOSIS — Q619 Cystic kidney disease, unspecified: Secondary | ICD-10-CM | POA: Insufficient documentation

## 2011-10-19 HISTORY — DX: Sickle-cell trait: D57.3

## 2011-10-19 LAB — CBC
MCH: 27.7 pg (ref 26.0–34.0)
MCV: 79.2 fL (ref 78.0–100.0)
Platelets: 228 10*3/uL (ref 150–400)
RBC: 4.66 MIL/uL (ref 3.87–5.11)
RDW: 12.9 % (ref 11.5–15.5)
WBC: 11.5 10*3/uL — ABNORMAL HIGH (ref 4.0–10.5)

## 2011-10-19 LAB — URINALYSIS, ROUTINE W REFLEX MICROSCOPIC
Nitrite: NEGATIVE
Protein, ur: NEGATIVE mg/dL
Specific Gravity, Urine: 1.02 (ref 1.005–1.030)
Urobilinogen, UA: 0.2 mg/dL (ref 0.0–1.0)

## 2011-10-19 LAB — DIFFERENTIAL
Basophils Absolute: 0 10*3/uL (ref 0.0–0.1)
Lymphocytes Relative: 13 % (ref 12–46)
Neutro Abs: 9.2 10*3/uL — ABNORMAL HIGH (ref 1.7–7.7)
Neutrophils Relative %: 79 % — ABNORMAL HIGH (ref 43–77)

## 2011-10-19 LAB — URINE MICROSCOPIC-ADD ON

## 2011-10-19 MED ORDER — LACTATED RINGERS IV BOLUS (SEPSIS): 250.0000 mL | Freq: Once | INTRAVENOUS | Status: DC

## 2011-10-19 MED ORDER — HYDROMORPHONE HCL PF 1 MG/ML IJ SOLN
2.0000 mg | Freq: Once | INTRAMUSCULAR | Status: AC
  Administered 2011-10-19: 2 mg via INTRAVENOUS
  Filled 2011-10-19: qty 2

## 2011-10-19 MED ORDER — ONDANSETRON HCL 4 MG/2ML IJ SOLN
INTRAMUSCULAR | Status: AC
  Administered 2011-10-19: 4 mg
  Filled 2011-10-19: qty 2

## 2011-10-19 MED ORDER — ONDANSETRON HCL 4 MG/2ML IJ SOLN
4.0000 mg | Freq: Once | INTRAMUSCULAR | Status: AC | PRN
  Administered 2011-10-19: 4 mg via INTRAVENOUS
  Filled 2011-10-19: qty 2

## 2011-10-19 MED ORDER — NALBUPHINE SYRINGE 5 MG/0.5 ML
10.0000 mg | INJECTION | Freq: Once | INTRAMUSCULAR | Status: AC
  Administered 2011-10-19: 10 mg via INTRAVENOUS
  Filled 2011-10-19 (×2): qty 1

## 2011-10-19 MED ORDER — OXYCODONE-ACETAMINOPHEN 5-325 MG PO TABS
1.0000 | ORAL_TABLET | Freq: Once | ORAL | Status: AC
  Administered 2011-10-19: 1 via ORAL
  Filled 2011-10-19: qty 1

## 2011-10-19 MED ORDER — LACTATED RINGERS IV SOLN
INTRAVENOUS | Status: DC
  Administered 2011-10-19: 14:00:00 via INTRAVENOUS

## 2011-10-19 NOTE — ED Notes (Signed)
MD at bedside. 

## 2011-10-19 NOTE — ED Notes (Signed)
Pt returned from MRI. Denies complaints of nausea/pain.

## 2011-10-19 NOTE — ED Notes (Signed)
[id number]<AV> Expected date:<BR> Expected time:<BR> Means of arrival:<BR> Comments:<BR> [name]

## 2011-10-19 NOTE — ED Notes (Signed)
Patient transported to MRI 

## 2011-10-19 NOTE — Discharge Instructions (Signed)
Abdominal Pain During Pregnancy  Abdominal discomfort is common in pregnancy. Most of the time, it does not cause harm. There are many causes of abdominal pain. Some causes are more serious than others. Some of the causes of abdominal pain in pregnancy are easily diagnosed. Occasionally, the diagnosis takes time to understand. Other times, the cause is not determined. Abdominal pain can be a sign that something is very wrong with the pregnancy, or the pain may have nothing to do with the pregnancy at all. For this reason, always tell your caregiver if you have any abdominal discomfort.  CAUSES  Common and harmless causes of abdominal pain include:   Constipation.   Excess gas and bloating.   Round ligament pain. This is pain that is felt in the folds of the groin.   The position the baby or placenta is in.   Baby kicks.   Braxton-Hicks contractions. These are mild contractions that do not cause cervical dilation.  Serious causes of abdominal pain include:   Ectopic pregnancy. This happens when a fertilized egg implants outside of the uterus.   Miscarriage.   Preterm labor. This is when labor starts at less than 37 weeks of pregnancy.   Placental abruption. This is when the placenta partially or completely separates from the uterus.   Preeclampsia. This is often associated with high blood pressure and has been referred to as "toxemia in pregnancy."   Uterine or amniotic fluid infections.  Causes unrelated to pregnancy include:   Urinary tract infection.   Gallbladder stones or inflammation.   Hepatitis or other liver illness.   Intestinal problems, stomach flu, food poisoning, or ulcer.   Appendicitis.   Kidney (renal) stones.   Kidney infection (pylonephritis).  HOME CARE INSTRUCTIONS   For mild pain:   Do not have sexual intercourse or put anything in your vagina until your symptoms go away completely.   Get plenty of rest until your pain improves. If your pain does not improve in 1 hour, call  your caregiver.   Drink clear fluids if you feel nauseous. Avoid solid food as long as you are uncomfortable or nauseous.   Only take medicine as directed by your caregiver.   Keep all follow-up appointments with your caregiver.  SEEK IMMEDIATE MEDICAL CARE IF:   You are bleeding, leaking fluid, or passing tissue from the vagina.   You have increasing pain or cramping.   You have persistent vomiting.   You have painful or bloody urination.   You have a fever.   You notice a decrease in your baby's movements.   You have extreme weakness or feel faint.   You have shortness of breath, with or without abdominal pain.   You develop a severe headache with abdominal pain.   You have abnormal vaginal discharge with abdominal pain.   You have persistent diarrhea.   You have abdominal pain that continues even after rest, or gets worse.  MAKE SURE YOU:    Understand these instructions.   Will watch your condition.   Will get help right away if you are not doing well or get worse.  Document Released: 04/12/2005 Document Revised: 04/01/2011 Document Reviewed: 11/06/2010  [employer] Patient Information 2012 [employer].

## 2011-10-19 NOTE — MAU Note (Signed)
Pt states having left ovary pain that radiates to right side. Pain is sharp and shoots up to her heart. Feels shortness of breath. Pain began around 1000. Is intermittent. Denies vaginal d/c changes or bleeding. Had u/s performed at [facility] this pregnancy.

## 2011-10-19 NOTE — ED Notes (Signed)
Patient is resting

## 2011-10-19 NOTE — ED Notes (Signed)
Pt arrived to ER via [facility]. Pt transferred from [facility], [redacted]wks pregnant pt worked up for pregnancy abnormalities with negative results. Pt here to have MRI without contrast? To rule out appendicitis. 18G R hand. Pt was given medications for pain at [facility]. [facility] VS: 121/76 HR 81 02 100% RA

## 2011-10-19 NOTE — MAU Provider Note (Signed)
Sherry A Dunston20 y.o.G1P0 @[redacted]w[redacted]d  by LMP Chief Complaint  Patient presents with  . Abdominal Pain     First Provider Initiated Contact with Patient 10/19/11 1344      SUBJECTIVE  HPI: HPI: [name] is a 21 y.o. year old G1P0 female at [redacted]w[redacted]d weeks gestation who presents to MAU reporting RLQ pain that radiates to her LLQ and up to her epigastric region, six episodes of diarrhea, SOB w/ onset of Sx two days ago, much worse since yesterday. Describes diarrhea as loose stools, not watery or severely malodorous and no blood in stool. She has been evaluated in MAU and ED's multiple times for vague complaints of abd pain, constipation and gas since early May. IUP verified. Has had normal WBC's and [place]. No sick contacts.   Past Medical History  Diagnosis Date  . Hypoglycemia   . Hypoglycemia   . Ovarian cyst 2011  . Constipation   . Sickle cell trait    Past Surgical History  Procedure Date  . Laparoscopic ovarian cystectomy 2011  . Eye surgery     2004   History   Social History  . Marital Status: Single    Spouse Name: N/A    Number of Children: N/A  . Years of Education: N/A   Occupational History  . Not on file.   Social History Main Topics  . Smoking status: Never Smoker   . Smokeless tobacco: Never Used  . Alcohol Use: No  . Drug Use: No  . Sexually Active: Yes   Other Topics Concern  . Not on file   Social History Narrative  . No narrative on file   No current facility-administered medications on file prior to encounter.   Current Outpatient Prescriptions on File Prior to Encounter  Medication Sig Dispense Refill  . acetaminophen (TYLENOL) 325 MG tablet Take 650 mg by mouth daily as needed. For pain      . Prenatal Vit-Fe Fumarate-FA (PRENATAL MULTIVITAMIN) TABS Take 1 tablet by mouth daily.       No Known Allergies  ROS: Denies fever, chills, constipation, urinary complaints, vaginal bleeding or discharge.   OBJECTIVE Blood pressure 118/79, pulse  83, temperature 98.9 F (37.2 C), temperature source Oral, resp. rate 16, height 5\' 3"  (1.6 m), weight 56.019 kg (123 lb 8 oz), last menstrual period 08/01/2011, SpO2 99.00%.  GENERAL: Well-developed, well-nourished female in mild distress.  HEENT: Normocephalic, good dentition HEART: normal rate, RRR RESP: normal effort, CTAB ABDOMEN: Soft, severe tenderness at McBurney's point, radiating to LLQ and epigastric area. Pos RLQ rebound, no mass. + BS x 4. EXTREMITIES: Nontender, no edema NEURO: Alert and oriented SPECULUM EXAM: Deferred due to normal recent pelvic exam. No new ob/gyn complaints   LAB RESULTS  Results for orders placed during the hospital encounter of 10/19/11 (from the past 24 hour(s))  URINALYSIS, ROUTINE W REFLEX MICROSCOPIC     Status: Abnormal   Collection Time   10/19/11  1:55 PM      Component Value Range   Color, Urine YELLOW  YELLOW   APPearance HAZY (*) CLEAR   Specific Gravity, Urine 1.020  1.005 - 1.030   pH 5.5  5.0 - 8.0   Glucose, UA NEGATIVE  NEGATIVE mg/dL   Hgb urine dipstick TRACE (*) NEGATIVE   Bilirubin Urine NEGATIVE  NEGATIVE   Ketones, ur NEGATIVE  NEGATIVE mg/dL   Protein, ur NEGATIVE  NEGATIVE mg/dL   Urobilinogen, UA 0.2  0.0 - 1.0 mg/dL  Nitrite NEGATIVE  NEGATIVE   Leukocytes, UA NEGATIVE  NEGATIVE  URINE MICROSCOPIC-ADD ON     Status: Abnormal   Collection Time   10/19/11  1:55 PM      Component Value Range   Squamous Epithelial / LPF FEW (*) RARE   WBC, UA 0-2  <3 WBC/hpf   Bacteria, UA RARE  RARE  CBC     Status: Abnormal   Collection Time   10/19/11  1:58 PM      Component Value Range   WBC 11.5 (*) 4.0 - 10.5 K/uL   RBC 4.66  3.87 - 5.11 MIL/uL   Hemoglobin 12.9  12.0 - 15.0 g/dL   HCT 09.8  11.9 - 14.7 %   MCV 79.2  78.0 - 100.0 fL   MCH 27.7  26.0 - 34.0 pg   MCHC 35.0  30.0 - 36.0 g/dL   RDW 82.9  56.2 - 13.0 %   Platelets 228  150 - 400 K/uL  DIFFERENTIAL     Status: Abnormal   Collection Time   10/19/11  1:58 PM       Component Value Range   Neutrophils Relative 79 (*) 43 - 77 %   Neutro Abs 9.2 (*) 1.7 - 7.7 K/uL   Lymphocytes Relative 13  12 - 46 %   Lymphs Abs 1.5  0.7 - 4.0 K/uL   Monocytes Relative 7  3 - 12 %   Monocytes Absolute 0.8  0.1 - 1.0 K/uL   Eosinophils Relative 0  0 - 5 %   Eosinophils Absolute 0.1  0.0 - 0.7 K/uL   Basophils Relative 0  0 - 1 %   Basophils Absolute 0.0  0.0 - 0.1 K/uL    IMAGING   US Abdomen Limited  10/19/2011  *RADIOLOGY REPORT*  Clinical Data: Right lower quadrant pain and pregnant patient  LIMITED ABDOMINAL ULTRASOUND  Comparison:  None.  Findings: There is no gross free fluid or fluid collection in the right lower quadrant.  The appendix is not seen.  Peristalsing bowel is identified throughout the right lower quadrant.  IMPRESSION: There are no findings to suggest appendicitis, sonographically.  If clinical suspicion persists, MRI without contrast is recommended.  Original Report Authenticated By: Brandon Melnick, M.D.    ASSESSMENT 1. RLQ abdominal pain   2. Pregnancy as incidental finding    PLAN Transfer to [facility] by [facility] for MRI to R/O appendicitis Medication List  As of 10/19/2011  4:13 PM   ASK your doctor about these medications         acetaminophen 325 MG tablet   Commonly known as: TYLENOL   Take 650 mg by mouth daily as needed. For pain      prenatal multivitamin Tabs   Take 1 tablet by mouth daily.            [name] 10/19/2011 4:13 PM

## 2011-10-19 NOTE — ED Provider Notes (Signed)
History     CSN: [id number]  Arrival date & time 10/19/11  1314   First MD Initiated Contact with Patient 10/19/11 1707      Chief Complaint  Patient presents with  . Abdominal Pain    (Consider location/radiation/quality/duration/timing/severity/associated sxs/prior treatment) Patient is a 21 y.o. female presenting with abdominal pain. The history is provided by the patient.  Abdominal Pain The primary symptoms of the illness include abdominal pain.   [facility] she presented with right lower quadrant pain. She is [redacted] weeks pregnant and according to their workup she had a negative ultrasound for ectopic and she has IUP. Patient denies any vaginal bleeding or discharge. She does not have a urinary symptoms. Pain is sharp and constant and was treated with opiate medications at the other hospital. She was sent here to have MRI to rule out appendicitis  Past Medical History  Diagnosis Date  . Hypoglycemia   . Hypoglycemia   . Ovarian cyst 2011  . Constipation   . Sickle cell trait     Past Surgical History  Procedure Date  . Laparoscopic ovarian cystectomy 2011  . Eye surgery     2004    Family History  Problem Relation Age of Onset  . Anesthesia problems Neg Hx   . Hearing loss Neg Hx   . Cancer Paternal Grandmother     breast    History  Substance Use Topics  . Smoking status: Never Smoker   . Smokeless tobacco: Never Used  . Alcohol Use: No    OB History    Grav Para Term Preterm Abortions TAB SAB Ect Mult Living   1               Review of Systems  Gastrointestinal: Positive for abdominal pain.  All other systems reviewed and are negative.    Allergies  Review of patient's allergies indicates no known allergies.  Home Medications   Current Outpatient Rx  Name Route Sig Dispense Refill  . ACETAMINOPHEN 325 MG PO TABS Oral Take 650 mg by mouth daily as needed. For pain    . PRENATAL MULTIVITAMIN CH Oral Take 1 tablet by  mouth daily.      BP 115/73  Pulse 73  Temp 98.2 F (36.8 C) (Oral)  Resp 16  Ht 5\' 3"  (1.6 m)  Wt 123 lb 8 oz (56.019 kg)  BMI 21.88 kg/m2  SpO2 99%  LMP 08/01/2011  Physical Exam  Nursing note and vitals reviewed. Constitutional: She is oriented to person, place, and time. She appears well-developed and well-nourished.  Non-toxic appearance. No distress.  HENT:  Head: Normocephalic and atraumatic.  Eyes: Conjunctivae, EOM and lids are normal. Pupils are equal, round, and reactive to light.  Neck: Normal range of motion. Neck supple. No tracheal deviation present. No mass present.  Cardiovascular: Normal rate, regular rhythm and normal heart sounds.  Exam reveals no gallop.   No murmur heard. Pulmonary/Chest: Effort normal and breath sounds normal. No stridor. No respiratory distress. She has no decreased breath sounds. She has no wheezes. She has no rhonchi. She has no rales.  Abdominal: Soft. Normal appearance and bowel sounds are normal. She exhibits no distension. There is tenderness in the right lower quadrant. There is guarding. There is no rigidity, no rebound and no CVA tenderness.  Musculoskeletal: Normal range of motion. She exhibits no edema and no tenderness.  Neurological: She is alert and oriented to person, place, and time. She  has normal strength. No cranial nerve deficit or sensory deficit. GCS eye subscore is 4. GCS verbal subscore is 5. GCS motor subscore is 6.  Skin: Skin is warm and dry. No abrasion and no rash noted.  Psychiatric: She has a normal mood and affect. Her speech is normal and behavior is normal.    ED Course  Procedures (including critical care time)  Labs Reviewed  URINALYSIS, ROUTINE W REFLEX MICROSCOPIC - Abnormal; Notable for the following:    APPearance HAZY (*)     Hgb urine dipstick TRACE (*)     All other components within normal limits  CBC - Abnormal; Notable for the following:    WBC 11.5 (*)     All other components within  normal limits  DIFFERENTIAL - Abnormal; Notable for the following:    Neutrophils Relative 79 (*)     Neutro Abs 9.2 (*)     All other components within normal limits  URINE MICROSCOPIC-ADD ON - Abnormal; Notable for the following:    Squamous Epithelial / LPF FEW (*)     All other components within normal limits   US Ob Comp Less 14 Wks  10/19/2011  OBSTETRICAL ULTRASOUND: This exam was performed within a  Ultrasound Department. The OB US report was generated in the [facility] system, and faxed to the ordering physician.   This report is also available in [employer] and in the [employer]. See [facility] Obstetric US report.   US Abdomen Limited  10/19/2011  *RADIOLOGY REPORT*  Clinical Data: Right lower quadrant pain and pregnant patient  LIMITED ABDOMINAL ULTRASOUND  Comparison:  None.  Findings: There is no gross free fluid or fluid collection in the right lower quadrant.  The appendix is not seen.  Peristalsing bowel is identified throughout the right lower quadrant.  IMPRESSION: There are no findings to suggest appendicitis, sonographically.  If clinical suspicion persists, MRI without contrast is recommended.  Original Report Authenticated By: Brandon Melnick, M.D.     1. RLQ abdominal pain   2. Pregnancy as incidental finding       MDM  All records reviewed and patient notes that this right lower quadrant problem has been for the past 4-5 weeks. She's been seen at [facility] multiple times for same without diagnosis. She has a confirmed IUP and most recently had ultrasound today. The MRI today did not show any signs of infection around the appendix however they could not identify the appendix. She is afebrile here. Doubt that patient has appendicitis at this time. Patient to followup with her gynecologist        Toy Baker, MD 10/19/11 2213

## 2011-10-21 NOTE — MAU Provider Note (Signed)
Attestation of Attending Supervision of Advanced Practitioner (CNM/NP): Evaluation and management procedures were performed by the Advanced Practitioner under my supervision and collaboration.  I have reviewed the Advanced Practitioner's note and chart, and I agree with the management and plan.  Jaynie Collins, M.D. 10/21/2011 9:01 AM

## 2011-11-04 ENCOUNTER — Inpatient Hospital Stay (HOSPITAL_COMMUNITY)
Admission: AD | Admit: 2011-11-04 | Discharge: 2011-11-04 | Disposition: A | Discharge status: Home / Self Care | Payer: Medicaid Other | Source: Ambulatory Visit | Attending: Obstetrics & Gynecology | Admitting: Obstetrics & Gynecology

## 2011-11-04 ENCOUNTER — Encounter (HOSPITAL_COMMUNITY): Payer: Self-pay

## 2011-11-04 DIAGNOSIS — R109 Unspecified abdominal pain: Secondary | ICD-10-CM | POA: Insufficient documentation

## 2011-11-04 DIAGNOSIS — N949 Unspecified condition associated with female genital organs and menstrual cycle: Secondary | ICD-10-CM

## 2011-11-04 DIAGNOSIS — O99891 Other specified diseases and conditions complicating pregnancy: Secondary | ICD-10-CM | POA: Insufficient documentation

## 2011-11-04 LAB — URINALYSIS, ROUTINE W REFLEX MICROSCOPIC
Ketones, ur: NEGATIVE mg/dL
Leukocytes, UA: NEGATIVE
Nitrite: NEGATIVE
Protein, ur: NEGATIVE mg/dL

## 2011-11-04 MED ORDER — PROMETHAZINE HCL 25 MG PO TABS: 25.0000 mg | ORAL_TABLET | Freq: Four times a day (QID) | ORAL | Status: DC | PRN

## 2011-11-04 NOTE — MAU Provider Note (Signed)
Sherry A Dunston21 y.o.G1P0 @[redacted]w[redacted]d  by LMP Chief Complaint  Patient presents with  . Abdominal Pain     First Provider Initiated Contact with Patient 11/04/11 2005      SUBJECTIVE  HPI: HPI: [name] is a 21 y.o. year old G1P0 female at [redacted]w[redacted]d weeks gestation who presents to MAU reporting mild-moderate right and lid low abd cramping, worse w/ mvmt today. . Denies VB, discharge, LOF, fever, chills, GI Sx, Urinary Sx. Has not tried anything for pain.   Past Medical History  Diagnosis Date  . Hypoglycemia   . Hypoglycemia   . Ovarian cyst 2011  . Constipation   . Sickle cell trait    Past Surgical History  Procedure Date  . Laparoscopic ovarian cystectomy 2011  . Eye surgery     2004   History   Social History  . Marital Status: Single    Spouse Name: N/A    Number of Children: N/A  . Years of Education: N/A   Occupational History  . Not on file.   Social History Main Topics  . Smoking status: Never Smoker   . Smokeless tobacco: Never Used  . Alcohol Use: No  . Drug Use: No  . Sexually Active: Yes   Other Topics Concern  . Not on file   Social History Narrative  . No narrative on file   No current facility-administered medications on file prior to encounter.   Current Outpatient Prescriptions on File Prior to Encounter  Medication Sig Dispense Refill  . acetaminophen (TYLENOL) 325 MG tablet Take 650 mg by mouth daily as needed. For pain      . Prenatal Vit-Fe Fumarate-FA (PRENATAL MULTIVITAMIN) TABS Take 1 tablet by mouth daily.      . promethazine (PHENERGAN) 25 MG tablet Take 1 tablet (25 mg total) by mouth every 6 (six) hours as needed for nausea.  30 tablet  1   No Known Allergies  ROS: Pertinent items in HPI  OBJECTIVE Blood pressure 126/75, pulse 98, temperature 98.7 F (37.1 C), temperature source Oral, resp. rate 16, last menstrual period 08/01/2011, SpO2 98.00%.  GENERAL: Well-developed, well-nourished female in no acute distress.    HEENT: Normocephalic, good dentition HEART: normal rate RESP: normal effort ABDOMEN: Soft, nontender EXTREMITIES: Nontender, no edema NEURO: Alert and oriented SPECULUM EXAM: deferred BIMANUAL: cervix clodes and long; uterus 12 weeks size; no adnexal tenderness or masses FHR 172 LAB RESULTS  Results for orders placed during the hospital encounter of 11/04/11 (from the past 24 hour(s))  URINALYSIS, ROUTINE W REFLEX MICROSCOPIC     Status: Normal   Collection Time   11/04/11  6:20 PM      Component Value Range   Color, Urine YELLOW  YELLOW   APPearance CLEAR  CLEAR   Specific Gravity, Urine 1.020  1.005 - 1.030   pH 6.0  5.0 - 8.0   Glucose, UA NEGATIVE  NEGATIVE mg/dL   Hgb urine dipstick NEGATIVE  NEGATIVE   Bilirubin Urine NEGATIVE  NEGATIVE   Ketones, ur NEGATIVE  NEGATIVE mg/dL   Protein, ur NEGATIVE  NEGATIVE mg/dL   Urobilinogen, UA 0.2  0.0 - 1.0 mg/dL   Nitrite NEGATIVE  NEGATIVE   Leukocytes, UA NEGATIVE  NEGATIVE    IMAGING NA  ASSESSMENT  1. Round ligament pain     PLAN D/C home First trimester precautions Would like to start care at [facility]. WiIll send message to clinic.  Follow-up Information    Follow up with [facility]. (or MAU as needed if symptoms worsen)    Contact information:   [address] [place] [address] [phone]        Medication List  As of 11/04/2011  8:32 PM   TAKE these medications         acetaminophen 325 MG tablet   Commonly known as: TYLENOL   Take 650 mg by mouth daily as needed. For pain      prenatal multivitamin Tabs   Take 1 tablet by mouth daily.      promethazine 25 MG tablet   Commonly known as: PHENERGAN   Take 1 tablet (25 mg total) by mouth every 6 (six) hours as needed for nausea.           Dorathy Kinsman 11/04/2011 8:26 PM

## 2011-11-04 NOTE — MAU Note (Signed)
Patient states she has sharp pain on the right side today. No bleeding or discharge. Will get her care in [facility].

## 2011-11-30 ENCOUNTER — Inpatient Hospital Stay (HOSPITAL_COMMUNITY)
Admission: AD | Admit: 2011-11-30 | Discharge: 2011-12-01 | Disposition: A | Discharge status: Home / Self Care | Payer: Medicaid Other | Source: Ambulatory Visit | Attending: Family Medicine | Admitting: Family Medicine

## 2011-11-30 ENCOUNTER — Encounter (HOSPITAL_COMMUNITY): Payer: Self-pay | Admitting: *Deleted

## 2011-11-30 DIAGNOSIS — R109 Unspecified abdominal pain: Secondary | ICD-10-CM | POA: Insufficient documentation

## 2011-11-30 DIAGNOSIS — O99891 Other specified diseases and conditions complicating pregnancy: Secondary | ICD-10-CM | POA: Insufficient documentation

## 2011-11-30 DIAGNOSIS — O26899 Other specified pregnancy related conditions, unspecified trimester: Secondary | ICD-10-CM

## 2011-11-30 DIAGNOSIS — N949 Unspecified condition associated with female genital organs and menstrual cycle: Secondary | ICD-10-CM

## 2011-11-30 DIAGNOSIS — O9989 Other specified diseases and conditions complicating pregnancy, childbirth and the puerperium: Secondary | ICD-10-CM

## 2011-11-30 NOTE — MAU Note (Signed)
PT SAYS  SHE IS HAVING BAD PAIN ON L SIDE.- STARTED   8 PM..      VAGINA STARTED HURTING  ON Saturday NIGHT.   LAST SEX-  July.   .  DENIES HSV AND MRSA.   NO DR.

## 2011-12-01 ENCOUNTER — Encounter (HOSPITAL_COMMUNITY): Payer: Self-pay | Admitting: *Deleted

## 2011-12-01 LAB — URINE MICROSCOPIC-ADD ON

## 2011-12-01 LAB — URINALYSIS, ROUTINE W REFLEX MICROSCOPIC
Bilirubin Urine: NEGATIVE
Ketones, ur: NEGATIVE mg/dL
Nitrite: NEGATIVE
Urobilinogen, UA: 0.2 mg/dL (ref 0.0–1.0)

## 2011-12-01 LAB — CBC WITH DIFFERENTIAL/PLATELET
Basophils Relative: 0 % (ref 0–1)
Eosinophils Absolute: 0.1 10*3/uL (ref 0.0–0.7)
Eosinophils Relative: 1 % (ref 0–5)
Hemoglobin: 12.1 g/dL (ref 12.0–15.0)
MCH: 28.4 pg (ref 26.0–34.0)
MCHC: 35.5 g/dL (ref 30.0–36.0)
MCV: 80 fL (ref 78.0–100.0)
Monocytes Absolute: 0.9 10*3/uL (ref 0.1–1.0)
Monocytes Relative: 8 % (ref 3–12)
Neutrophils Relative %: 78 % — ABNORMAL HIGH (ref 43–77)

## 2011-12-01 LAB — WET PREP, GENITAL
Clue Cells Wet Prep HPF POC: NONE SEEN
Trich, Wet Prep: NONE SEEN

## 2011-12-01 MED ORDER — HYDROCODONE-ACETAMINOPHEN 5-325 MG PO TABS
1.0000 | ORAL_TABLET | Freq: Once | ORAL | Status: AC
  Administered 2011-12-01: 1 via ORAL
  Filled 2011-12-01: qty 1

## 2011-12-01 NOTE — MAU Provider Note (Signed)
History     CSN: [id number]  Arrival date and time: 11/30/11 2237   First Provider Initiated Contact with Patient 12/01/11 0031      Chief Complaint  Patient presents with  . Abdominal Pain   HPI [name] is a 21 y.o. female @ [redacted]w[redacted]d gestation who presents to MAU with abdominal pain. The pain started yesterday. She describes the pain as sharp that comes and goes. The pain is located on both sides of the abdomen. Left more than right . The patient was here 3 weeks ago with left side abdominal pain and had a complete evaluation with ultrasound. The history was provided by the patient and her medical record.  OB History    Grav Para Term Preterm Abortions TAB SAB Ect Mult Living   1               Past Medical History  Diagnosis Date  . Hypoglycemia   . Hypoglycemia   . Ovarian cyst 2011  . Constipation   . Sickle cell trait     Past Surgical History  Procedure Date  . Laparoscopic ovarian cystectomy 2011  . Eye surgery     2004    Family History  Problem Relation Age of Onset  . Anesthesia problems Neg Hx   . Hearing loss Neg Hx   . Cancer Paternal Grandmother     breast    History  Substance Use Topics  . Smoking status: Never Smoker   . Smokeless tobacco: Never Used  . Alcohol Use: No    Allergies: No Known Allergies  Prescriptions prior to admission  Medication Sig Dispense Refill  . acetaminophen (TYLENOL) 325 MG tablet Take 650 mg by mouth daily as needed. For pain      . Prenatal Vit-Fe Fumarate-FA (PRENATAL MULTIVITAMIN) TABS Take 1 tablet by mouth daily.      . promethazine (PHENERGAN) 25 MG tablet Take 1 tablet (25 mg total) by mouth every 6 (six) hours as needed for nausea.  30 tablet  1    Review of Systems  Constitutional: Negative for fever, chills and weight loss.  HENT: Negative for ear pain, nosebleeds, congestion, sore throat and neck pain.   Eyes: Negative for blurred vision, double vision, photophobia and pain.  Respiratory:  Negative for cough, shortness of breath and wheezing.   Cardiovascular: Negative for chest pain, palpitations and leg swelling.  Gastrointestinal: Positive for nausea and abdominal pain. Negative for heartburn, vomiting, diarrhea and constipation.  Genitourinary: Positive for frequency. Negative for dysuria and urgency.  Musculoskeletal: Negative for myalgias and back pain.  Skin: Negative for itching and rash.  Neurological: Negative for dizziness, sensory change, speech change, seizures, weakness and headaches.  Endo/Heme/Allergies: Does not bruise/bleed easily.  Psychiatric/Behavioral: Negative for depression. The patient is not nervous/anxious.    Physical Exam   Blood pressure 118/66, pulse 77, temperature 98.5 F (36.9 C), temperature source Oral, resp. rate 18, height 5\' 3"  (1.6 m), weight 127 lb 2 oz (57.664 kg), last menstrual period 08/01/2011.  Physical Exam  Nursing note and vitals reviewed. Constitutional: She is oriented to person, place, and time. She appears well-developed and well-nourished. No distress.  HENT:  Head: Normocephalic and atraumatic.  Eyes: EOM are normal.  Neck: Neck supple.  Cardiovascular: Normal rate.   Respiratory: Effort normal.  GI: Soft. There is tenderness in the right lower quadrant and left lower quadrant. There is no rigidity, no rebound, no guarding and no CVA tenderness.  Positive FHT's.  Genitourinary:       External genitalia without lesions. White discharge vaginal vault. Cervix long, closed no CMT. Left adnexal tenderness. Uterus approximately 14 week size.  Musculoskeletal: Normal range of motion.  Neurological: She is alert and oriented to person, place, and time.  Skin: Skin is warm and dry.  Psychiatric: She has a normal mood and affect. Her behavior is normal. Judgment and thought content normal.   Results for orders placed during the hospital encounter of 11/30/11 (from the past 24 hour(s))  URINALYSIS, ROUTINE W REFLEX  MICROSCOPIC     Status: Abnormal   Collection Time   11/30/11 11:18 PM      Component Value Range   Color, Urine YELLOW  YELLOW   APPearance CLEAR  CLEAR   Specific Gravity, Urine 1.020  1.005 - 1.030   pH 6.0  5.0 - 8.0   Glucose, UA NEGATIVE  NEGATIVE mg/dL   Hgb urine dipstick TRACE (*) NEGATIVE   Bilirubin Urine NEGATIVE  NEGATIVE   Ketones, ur NEGATIVE  NEGATIVE mg/dL   Protein, ur NEGATIVE  NEGATIVE mg/dL   Urobilinogen, UA 0.2  0.0 - 1.0 mg/dL   Nitrite NEGATIVE  NEGATIVE   Leukocytes, UA NEGATIVE  NEGATIVE  URINE MICROSCOPIC-ADD ON     Status: Abnormal   Collection Time   11/30/11 11:18 PM      Component Value Range   Squamous Epithelial / LPF FEW (*) RARE   WBC, UA 0-2  <3 WBC/hpf   RBC / HPF 0-2  <3 RBC/hpf   Bacteria, UA RARE  RARE   Urine-Other MUCOUS PRESENT    CBC WITH DIFFERENTIAL     Status: Abnormal   Collection Time   12/01/11 12:40 AM      Component Value Range   WBC 12.4 (*) 4.0 - 10.5 K/uL   RBC 4.26  3.87 - 5.11 MIL/uL   Hemoglobin 12.1  12.0 - 15.0 g/dL   HCT 11.9 (*) 14.7 - 82.9 %   MCV 80.0  78.0 - 100.0 fL   MCH 28.4  26.0 - 34.0 pg   MCHC 35.5  30.0 - 36.0 g/dL   RDW 56.2  13.0 - 86.5 %   Platelets 223  150 - 400 K/uL   Neutrophils Relative 78 (*) 43 - 77 %   Neutro Abs 9.6 (*) 1.7 - 7.7 K/uL   Lymphocytes Relative 14  12 - 46 %   Lymphs Abs 1.8  0.7 - 4.0 K/uL   Monocytes Relative 8  3 - 12 %   Monocytes Absolute 0.9  0.1 - 1.0 K/uL   Eosinophils Relative 1  0 - 5 %   Eosinophils Absolute 0.1  0.0 - 0.7 K/uL   Basophils Relative 0  0 - 1 %   Basophils Absolute 0.0  0.0 - 0.1 K/uL  WET PREP, GENITAL     Status: Abnormal   Collection Time   12/01/11  1:00 AM      Component Value Range   Yeast Wet Prep HPF POC NONE SEEN  NONE SEEN   Trich, Wet Prep NONE SEEN  NONE SEEN   Clue Cells Wet Prep HPF POC NONE SEEN  NONE SEEN   WBC, Wet Prep HPF POC MODERATE (*) NONE SEEN   MAU Course  Procedures  Assessment: 21 y.o. female @ [redacted]w[redacted]d gestation with  abdominal pain   Round ligament pain  Plan:  Tylenol prn   Instructions to patient regarding round ligament pain  Follow up for prenatal care, return as needed. Follow-up Information    Follow up with [facility] [place].   Contact information:   [address] [place] [address]         Medication List  As of 12/01/2011  1:42 AM   CONTINUE taking these medications         acetaminophen 325 MG tablet   Commonly known as: TYLENOL      prenatal multivitamin Tabs         ASK your doctor about these medications         promethazine 25 MG tablet   Commonly known as: PHENERGAN   Take 1 tablet (25 mg total) by mouth every 6 (six) hours as needed for nausea.           I have reviewed this patient's vital signs, nurses notes and appropriate labs. I have discussed results with the patient and plan of care and she voices understanding.  NEESE,HOPE, RN, FNP, [facility] 12/01/2011, 1:38 AM

## 2011-12-01 NOTE — MAU Provider Note (Signed)
Chart reviewed and agree with management and plan.  

## 2011-12-01 NOTE — MAU Note (Signed)
Patient states pain in vagina started on Saturday, sharp shooting pain right before urination. Patient denies burning during urination. No leaking, bleeding or discharge noted.

## 2011-12-02 LAB — GC/CHLAMYDIA PROBE AMP, GENITAL
Chlamydia, DNA Probe: NEGATIVE
GC Probe Amp, Genital: NEGATIVE

## 2011-12-06 ENCOUNTER — Inpatient Hospital Stay (HOSPITAL_COMMUNITY)
Admission: AD | Admit: 2011-12-06 | Discharge: 2011-12-06 | Disposition: A | Discharge status: Hospice | Payer: Medicaid Other | Source: Ambulatory Visit | Attending: Obstetrics and Gynecology | Admitting: Obstetrics and Gynecology

## 2011-12-06 ENCOUNTER — Encounter (HOSPITAL_COMMUNITY): Payer: Self-pay | Admitting: *Deleted

## 2011-12-06 ENCOUNTER — Inpatient Hospital Stay (HOSPITAL_COMMUNITY): Payer: Medicaid Other

## 2011-12-06 DIAGNOSIS — O34599 Maternal care for other abnormalities of gravid uterus, unspecified trimester: Secondary | ICD-10-CM | POA: Insufficient documentation

## 2011-12-06 DIAGNOSIS — N83209 Unspecified ovarian cyst, unspecified side: Secondary | ICD-10-CM

## 2011-12-06 DIAGNOSIS — N949 Unspecified condition associated with female genital organs and menstrual cycle: Secondary | ICD-10-CM

## 2011-12-06 DIAGNOSIS — R109 Unspecified abdominal pain: Secondary | ICD-10-CM | POA: Insufficient documentation

## 2011-12-06 LAB — CBC WITH DIFFERENTIAL/PLATELET
Basophils Absolute: 0 10*3/uL (ref 0.0–0.1)
Eosinophils Absolute: 0 10*3/uL (ref 0.0–0.7)
Eosinophils Relative: 0 % (ref 0–5)
HCT: 34.1 % — ABNORMAL LOW (ref 36.0–46.0)
MCH: 27.5 pg (ref 26.0–34.0)
MCHC: 34.3 g/dL (ref 30.0–36.0)
MCV: 80.2 fL (ref 78.0–100.0)
Monocytes Absolute: 0.7 10*3/uL (ref 0.1–1.0)
Platelets: 210 10*3/uL (ref 150–400)
RDW: 13 % (ref 11.5–15.5)

## 2011-12-06 LAB — COMPREHENSIVE METABOLIC PANEL
ALT: 11 U/L (ref 0–35)
AST: 18 U/L (ref 0–37)
CO2: 25 mEq/L (ref 19–32)
Calcium: 9.1 mg/dL (ref 8.4–10.5)
Creatinine, Ser: 0.65 mg/dL (ref 0.50–1.10)
GFR calc Af Amer: >90 mL/min (ref >90)
GFR calc non Af Amer: >90 mL/min (ref >90)
Sodium: 134 mEq/L — ABNORMAL LOW (ref 135–145)
Total Protein: 6.3 g/dL (ref 6.0–8.3)

## 2011-12-06 LAB — URINE MICROSCOPIC-ADD ON

## 2011-12-06 LAB — URINALYSIS, ROUTINE W REFLEX MICROSCOPIC
Bilirubin Urine: NEGATIVE
Glucose, UA: NEGATIVE mg/dL
Hgb urine dipstick: NEGATIVE
Ketones, ur: NEGATIVE mg/dL
Protein, ur: NEGATIVE mg/dL
Urobilinogen, UA: 0.2 mg/dL (ref 0.0–1.0)

## 2011-12-06 MED ORDER — HYDROMORPHONE HCL PF 1 MG/ML IJ SOLN
0.5000 mg | Freq: Once | INTRAMUSCULAR | Status: AC
  Administered 2011-12-06: 0.5 mg via INTRAVENOUS
  Filled 2011-12-06: qty 1

## 2011-12-06 MED ORDER — ONDANSETRON HCL 4 MG/2ML IJ SOLN
4.0000 mg | Freq: Once | INTRAMUSCULAR | Status: AC
  Administered 2011-12-06: 4 mg via INTRAVENOUS
  Filled 2011-12-06: qty 2

## 2011-12-06 MED ORDER — HYDROCODONE-ACETAMINOPHEN 5-325 MG PO TABS: 2.0000 | ORAL_TABLET | ORAL | Status: AC | PRN

## 2011-12-06 MED ORDER — LACTATED RINGERS IV BOLUS (SEPSIS): 1000.0000 mL | Freq: Once | INTRAVENOUS | Status: DC

## 2011-12-06 NOTE — MAU Note (Signed)
Was at work, not feeling pain. Pain in lower left side, started yesterday. Saw blood in emesis this morning.  [name] her out, afraid something is really wrong.  Also had diarrhea this morning.

## 2011-12-06 NOTE — MAU Note (Signed)
When calmed down and relaxed, abd is soft on palpation.

## 2011-12-06 NOTE — MAU Provider Note (Signed)
History     CSN: [id number]  Arrival date and time: 12/06/11 1209   First Provider Initiated Contact with Patient 12/06/11 1254      Chief Complaint  Patient presents with  . Abdominal Pain  . Hematemesis   HPI [name] is a 21 y.o. female @ [redacted]w[redacted]d gestation who presents to MAU with abdominal pain. She states she has had pain off and on but this pain started last night and has gotten worse. The pain today has been severe, 10/10 and is more on the left side. She had MRI 6/25 that showed no appendicitis but a left complex ovarian cystic area and suggested follow up in 4 weeks. Wet prep, GC and Chlamydia form 8/7 visit were negative. The history was provided by the patient and her medical record.  OB History    Grav Para Term Preterm Abortions TAB SAB Ect Mult Living   1               Past Medical History  Diagnosis Date  . Hypoglycemia   . Hypoglycemia   . Ovarian cyst 2011  . Constipation   . Sickle cell trait     Past Surgical History  Procedure Date  . Laparoscopic ovarian cystectomy 2011  . Eye surgery     2004    Family History  Problem Relation Age of Onset  . Anesthesia problems Neg Hx   . Hearing loss Neg Hx   . Cancer Paternal Grandmother     breast    History  Substance Use Topics  . Smoking status: Never Smoker   . Smokeless tobacco: Never Used  . Alcohol Use: No    Allergies: No Known Allergies  Prescriptions prior to admission  Medication Sig Dispense Refill  . acetaminophen (TYLENOL) 325 MG tablet Take 650 mg by mouth daily as needed. For pain        Review of Systems  Constitutional: Negative for fever, chills, weight loss and malaise/fatigue.  HENT: Negative for ear pain, nosebleeds, congestion and sore throat.   Eyes: Negative for blurred vision, double vision and photophobia.  Respiratory: Negative for cough and wheezing.   Gastrointestinal: Positive for nausea and abdominal pain. Negative for vomiting, diarrhea and  constipation.  Genitourinary: Negative for dysuria, urgency, frequency and flank pain.  Musculoskeletal: Negative for back pain.  Skin: Negative.  Negative for rash.  Neurological: Positive for headaches. Negative for dizziness and seizures.  Psychiatric/Behavioral: Negative for depression.   Physical Exam   Blood pressure 130/84, pulse 104, temperature 98.7 F (37.1 C), temperature source Oral, resp. rate 24, last menstrual period 08/01/2011.  Physical Exam  Constitutional: She is oriented to person, place, and time. She appears well-developed and well-nourished. No distress.       Patient appears uncomfortable. Holding left side of abdomen.  HENT:  Head: Normocephalic and atraumatic.  Eyes: EOM are normal.  Neck: Neck supple.  Cardiovascular: Normal rate.   Respiratory: Effort normal.  GI: Soft. Bowel sounds are normal. There is tenderness in the left lower quadrant. There is no rebound, no guarding and no CVA tenderness.       Left side of abdomen tender with palpation, increased tenderness LLQ.  Musculoskeletal: Normal range of motion.  Neurological: She is alert and oriented to person, place, and time.  Skin: Skin is warm and dry.  Psychiatric: She has a normal mood and affect. Her behavior is normal. Judgment and thought content normal.   Results for orders placed during the  hospital encounter of 12/06/11 (from the past 24 hour(s))  URINALYSIS, ROUTINE W REFLEX MICROSCOPIC     Status: Abnormal   Collection Time   12/06/11 12:26 PM      Component Value Range   Color, Urine YELLOW  YELLOW   APPearance CLEAR  CLEAR   Specific Gravity, Urine 1.015  1.005 - 1.030   pH 6.0  5.0 - 8.0   Glucose, UA NEGATIVE  NEGATIVE mg/dL   Hgb urine dipstick NEGATIVE  NEGATIVE   Bilirubin Urine NEGATIVE  NEGATIVE   Ketones, ur NEGATIVE  NEGATIVE mg/dL   Protein, ur NEGATIVE  NEGATIVE mg/dL   Urobilinogen, UA 0.2  0.0 - 1.0 mg/dL   Nitrite NEGATIVE  NEGATIVE   Leukocytes, UA TRACE (*)  NEGATIVE  URINE MICROSCOPIC-ADD ON     Status: Abnormal   Collection Time   12/06/11 12:26 PM      Component Value Range   Squamous Epithelial / LPF FEW (*) RARE   WBC, UA 0-2  <3 WBC/hpf  CBC WITH DIFFERENTIAL     Status: Abnormal   Collection Time   12/06/11  1:05 PM      Component Value Range   WBC 10.1  4.0 - 10.5 K/uL   RBC 4.25  3.87 - 5.11 MIL/uL   Hemoglobin 11.7 (*) 12.0 - 15.0 g/dL   HCT 96.0 (*) 45.4 - 09.8 %   MCV 80.2  78.0 - 100.0 fL   MCH 27.5  26.0 - 34.0 pg   MCHC 34.3  30.0 - 36.0 g/dL   RDW 11.9  14.7 - 82.9 %   Platelets 210  150 - 400 K/uL   Neutrophils Relative 78 (*) 43 - 77 %   Neutro Abs 7.9 (*) 1.7 - 7.7 K/uL   Lymphocytes Relative 14  12 - 46 %   Lymphs Abs 1.5  0.7 - 4.0 K/uL   Monocytes Relative 7  3 - 12 %   Monocytes Absolute 0.7  0.1 - 1.0 K/uL   Eosinophils Relative 0  0 - 5 %   Eosinophils Absolute 0.0  0.0 - 0.7 K/uL   Basophils Relative 0  0 - 1 %   Basophils Absolute 0.0  0.0 - 0.1 K/uL  COMPREHENSIVE METABOLIC PANEL     Status: Abnormal   Collection Time   12/06/11  1:05 PM      Component Value Range   Sodium 134 (*) 135 - 145 mEq/L   Potassium 4.3  3.5 - 5.1 mEq/L   Chloride 101  96 - 112 mEq/L   CO2 25  19 - 32 mEq/L   Glucose, Bld 84  70 - 99 mg/dL   BUN 7  6 - 23 mg/dL   Creatinine, Ser 5.62  0.50 - 1.10 mg/dL   Calcium 9.1  8.4 - 13.0 mg/dL   Total Protein 6.3  6.0 - 8.3 g/dL   Albumin 3.4 (*) 3.5 - 5.2 g/dL   AST 18  0 - 37 U/L   ALT 11  0 - 35 U/L   Alkaline Phosphatase 47  39 - 117 U/L   Total Bilirubin 0.2 (*) 0.3 - 1.2 mg/dL   GFR calc non Af Amer >90  >90 mL/min   GFR calc Af Amer >90  >90 mL/min   MAU Course  Procedures   @ 15:30 patient returned form ultrasound. States she is feeling much better Re examined, abdomen soft minimal tenderness left lower quad, no rebound or guarding.  Assessment:  Round ligament pain   Ovarian cyst (left)  Plan:  IV fluids   Dilaudid 0.5 mg IV   Rx hydrocodone   Start prenatal  care, return as needed. Medication List  As of 12/06/2011  6:59 PM   START taking these medications         HYDROcodone-acetaminophen 5-325 MG per tablet   Commonly known as: NORCO/VICODIN   Take 2 tablets by mouth every 4 (four) hours as needed for pain.         CONTINUE taking these medications         acetaminophen 325 MG tablet   Commonly known as: TYLENOL          Where to get your medications    These are the prescriptions that you need to pick up.   You may get these medications from any pharmacy.         HYDROcodone-acetaminophen 5-325 MG per tablet           Follow-up Information    Schedule an appointment as soon as possible for a visit to follow up. (OB of choice)          Londa Mackowski, RN, FNP, [facility] 12/06/2011, 2:55 PM

## 2011-12-07 LAB — URINE CULTURE

## 2011-12-09 NOTE — MAU Provider Note (Signed)
Agree with above note.  [name] 12/09/2011 8:12 AM

## 2011-12-11 ENCOUNTER — Inpatient Hospital Stay (HOSPITAL_COMMUNITY): Payer: Medicaid Other

## 2011-12-11 ENCOUNTER — Encounter (HOSPITAL_COMMUNITY): Payer: Self-pay | Admitting: Obstetrics and Gynecology

## 2011-12-11 ENCOUNTER — Inpatient Hospital Stay (HOSPITAL_COMMUNITY)
Admission: AD | Admit: 2011-12-11 | Discharge: 2011-12-11 | Disposition: A | Discharge status: Home / Self Care | Payer: Medicaid Other | Source: Ambulatory Visit | Attending: Obstetrics & Gynecology | Admitting: Obstetrics & Gynecology

## 2011-12-11 DIAGNOSIS — W19XXXA Unspecified fall, initial encounter: Secondary | ICD-10-CM

## 2011-12-11 DIAGNOSIS — Y92009 Unspecified place in unspecified non-institutional (private) residence as the place of occurrence of the external cause: Secondary | ICD-10-CM | POA: Insufficient documentation

## 2011-12-11 DIAGNOSIS — S6390XA Sprain of unspecified part of unspecified wrist and hand, initial encounter: Secondary | ICD-10-CM | POA: Insufficient documentation

## 2011-12-11 DIAGNOSIS — O99891 Other specified diseases and conditions complicating pregnancy: Secondary | ICD-10-CM | POA: Insufficient documentation

## 2011-12-11 DIAGNOSIS — N949 Unspecified condition associated with female genital organs and menstrual cycle: Secondary | ICD-10-CM

## 2011-12-11 DIAGNOSIS — S66911A Strain of unspecified muscle, fascia and tendon at wrist and hand level, right hand, initial encounter: Secondary | ICD-10-CM

## 2011-12-11 DIAGNOSIS — W07XXXA Fall from chair, initial encounter: Secondary | ICD-10-CM | POA: Insufficient documentation

## 2011-12-11 DIAGNOSIS — R109 Unspecified abdominal pain: Secondary | ICD-10-CM

## 2011-12-11 MED ORDER — OXYCODONE-ACETAMINOPHEN 5-325 MG PO TABS
1.0000 | ORAL_TABLET | Freq: Once | ORAL | Status: AC
  Administered 2011-12-11: 1 via ORAL
  Filled 2011-12-11: qty 1

## 2011-12-11 MED ORDER — IBUPROFEN 800 MG PO TABS: 800.0000 mg | ORAL_TABLET | Freq: Three times a day (TID) | ORAL | Status: AC | PRN

## 2011-12-11 NOTE — MAU Provider Note (Signed)
  History     CSN: [id number]  Arrival date and time: 12/11/11 1308   First Provider Initiated Contact with Patient 12/11/11 1341      No chief complaint on file.  HPI Larey Seat off couch today, fell on right hand and abdomen, no bleeding, denies violence/abuse. Hand is hurting, hurts to move thumb.    Past Medical History  Diagnosis Date  . Hypoglycemia   . Hypoglycemia   . Ovarian cyst 2011  . Constipation   . Sickle cell trait     Past Surgical History  Procedure Date  . Laparoscopic ovarian cystectomy 2011  . Eye surgery     2004    Family History  Problem Relation Age of Onset  . Anesthesia problems Neg Hx   . Hearing loss Neg Hx   . Cancer Paternal Grandmother     breast    History  Substance Use Topics  . Smoking status: Never Smoker   . Smokeless tobacco: Never Used  . Alcohol Use: No    Allergies: No Known Allergies  Prescriptions prior to admission  Medication Sig Dispense Refill  . acetaminophen (TYLENOL) 325 MG tablet Take 650 mg by mouth daily as needed. For pain      . HYDROcodone-acetaminophen (NORCO/VICODIN) 5-325 MG per tablet Take 2 tablets by mouth every 4 (four) hours as needed for pain.  10 tablet  0    Review of Systems  Constitutional: Negative.   Respiratory: Negative.   Cardiovascular: Negative.   Gastrointestinal: Positive for abdominal pain. Negative for nausea, vomiting, diarrhea and constipation.  Genitourinary: Negative for dysuria, urgency, frequency, hematuria and flank pain.       Negative for vaginal bleeding, cramping/contractions  Musculoskeletal: Positive for joint pain and falls.  Neurological: Negative.   Psychiatric/Behavioral: Negative.    Physical Exam   Blood pressure 121/72, pulse 91, temperature 97.9 F (36.6 C), temperature source Oral, resp. rate 18, last menstrual period 08/01/2011.  Physical Exam  Nursing note and vitals reviewed. Constitutional: She is oriented to person, place, and time. She appears  well-developed and well-nourished. No distress.  Cardiovascular: Normal rate.   Respiratory: Effort normal.  GI: Soft. She exhibits no mass. There is tenderness (low abd). There is no rebound and no guarding.  Musculoskeletal: She exhibits tenderness (right hand below thumb).       Right hand: She exhibits decreased range of motion, tenderness and swelling.  Neurological: She is alert and oriented to person, place, and time.  Skin: Skin is warm and dry.  Psychiatric: She has a normal mood and affect.    MAU Course  Procedures  US Ob Limited: normal u/s, placenta and AFI WNL X-ray, right hand: normal, no fracture  Assessment and Plan  21 y.o. G1P0000 at [redacted]w[redacted]d s/p fall Abd pain - likely round ligament pain - precautions rev'd Hand strain - motrin, ice, wrap F/U as scheduled  [name] 12/11/2011, 1:42 PM

## 2011-12-17 ENCOUNTER — Encounter: Payer: Self-pay | Admitting: Family Medicine

## 2011-12-21 LAB — OB RESULTS CONSOLE HEPATITIS B SURFACE ANTIGEN: Hepatitis B Surface Ag: NEGATIVE

## 2011-12-21 LAB — OB RESULTS CONSOLE RPR: RPR: NONREACTIVE

## 2012-01-02 ENCOUNTER — Ambulatory Visit (HOSPITAL_COMMUNITY): Payer: Medicaid Other

## 2012-01-02 ENCOUNTER — Inpatient Hospital Stay (HOSPITAL_COMMUNITY)
Admission: AD | Admit: 2012-01-02 | Discharge: 2012-01-02 | Disposition: A | Discharge status: Home / Self Care | Payer: Medicaid Other | Source: Ambulatory Visit | Attending: Obstetrics & Gynecology | Admitting: Obstetrics & Gynecology

## 2012-01-02 ENCOUNTER — Inpatient Hospital Stay (HOSPITAL_COMMUNITY): Payer: Medicaid Other

## 2012-01-02 ENCOUNTER — Encounter (HOSPITAL_COMMUNITY): Payer: Self-pay | Admitting: Obstetrics and Gynecology

## 2012-01-02 DIAGNOSIS — R102 Pelvic and perineal pain: Secondary | ICD-10-CM

## 2012-01-02 DIAGNOSIS — O26899 Other specified pregnancy related conditions, unspecified trimester: Secondary | ICD-10-CM

## 2012-01-02 DIAGNOSIS — O99891 Other specified diseases and conditions complicating pregnancy: Secondary | ICD-10-CM | POA: Insufficient documentation

## 2012-01-02 DIAGNOSIS — N949 Unspecified condition associated with female genital organs and menstrual cycle: Secondary | ICD-10-CM | POA: Insufficient documentation

## 2012-01-02 DIAGNOSIS — R1032 Left lower quadrant pain: Secondary | ICD-10-CM | POA: Insufficient documentation

## 2012-01-02 LAB — CBC
HCT: 33.8 % — ABNORMAL LOW (ref 36.0–46.0)
MCHC: 34.6 g/dL (ref 30.0–36.0)
Platelets: 222 10*3/uL (ref 150–400)
RDW: 13.1 % (ref 11.5–15.5)
WBC: 8.8 10*3/uL (ref 4.0–10.5)

## 2012-01-02 LAB — URINALYSIS, ROUTINE W REFLEX MICROSCOPIC
Glucose, UA: NEGATIVE mg/dL
Ketones, ur: NEGATIVE mg/dL
Leukocytes, UA: NEGATIVE
Nitrite: NEGATIVE
Protein, ur: NEGATIVE mg/dL
Urobilinogen, UA: 0.2 mg/dL (ref 0.0–1.0)

## 2012-01-02 LAB — POCT CBG MONITORING: .: 87

## 2012-01-02 MED ORDER — HYDROMORPHONE HCL PF 1 MG/ML IJ SOLN
1.0000 mg | Freq: Once | INTRAMUSCULAR | Status: AC
  Administered 2012-01-02: 1 mg via INTRAMUSCULAR
  Filled 2012-01-02: qty 1

## 2012-01-02 MED ORDER — IBUPROFEN 200 MG PO TABS: 600.0000 mg | ORAL_TABLET | Freq: Four times a day (QID) | ORAL | Status: DC | PRN

## 2012-01-02 MED ORDER — HYDROMORPHONE HCL PF 1 MG/ML IJ SOLN
1.0000 mg | Freq: Once | INTRAMUSCULAR | Status: DC
  Filled 2012-01-02: qty 1

## 2012-01-02 MED ORDER — HYDROMORPHONE HCL PF 1 MG/ML IJ SOLN
1.0000 mg | Freq: Once | INTRAMUSCULAR | Status: AC
  Administered 2012-01-02: 1 mg via INTRAMUSCULAR

## 2012-01-02 MED ORDER — PROMETHAZINE HCL 25 MG PO TABS: 25.0000 mg | ORAL_TABLET | Freq: Four times a day (QID) | ORAL | Status: DC | PRN

## 2012-01-02 MED ORDER — HYDROCODONE-ACETAMINOPHEN 5-325 MG PO TABS: 1.0000 | ORAL_TABLET | Freq: Four times a day (QID) | ORAL | Status: AC | PRN

## 2012-01-02 NOTE — MAU Note (Signed)
Pt presents to MAU- patient requested a wheelchair from the car. Pt complains of severe abdominal pain and right leg weakness. Telford Nab CNM called to room

## 2012-01-02 NOTE — MAU Provider Note (Signed)
History     CSN: [id number]  Arrival date and time: 01/02/12 1045   First Provider Initiated Contact with Patient 01/02/12 1129      Chief Complaint  Patient presents with  . Abdominal Pain   HPI 21 y.o. G1P0000 at [redacted]w[redacted]d with LLQ pain, ongoing throughout pregnancy, worsening. Denies vaginal bleeding or LOF. States pain makes her right leg numb and weak, unable to stand, brought into room by wheelchair. Pt was seen in June, pelvic MRI showed left adnexal mass, ovarian cyst vs. Hydrosalpinx, follow up was recommended in 4 weeks, pt has not had cyst reevaluated. Pt has history of right ovarian cystectomy, states that u/s showed 3 cm cyst, but cyst was actually the size of a grapefruit.   Started prenatal care with [facility] 2 weeks ago.    Past Medical History  Diagnosis Date  . Hypoglycemia   . Hypoglycemia   . Ovarian cyst 2011  . Constipation   . Sickle cell trait     Past Surgical History  Procedure Date  . Laparoscopic ovarian cystectomy 2011  . Eye surgery     2004    Family History  Problem Relation Age of Onset  . Anesthesia problems Neg Hx   . Hearing loss Neg Hx   . Cancer Paternal Grandmother     breast    History  Substance Use Topics  . Smoking status: Never Smoker   . Smokeless tobacco: Never Used  . Alcohol Use: No    Allergies: No Known Allergies  No prescriptions prior to admission    Review of Systems  Constitutional: Negative.   Respiratory: Negative.   Cardiovascular: Negative.   Gastrointestinal: Positive for nausea and abdominal pain. Negative for vomiting, diarrhea and constipation.  Genitourinary: Negative for dysuria, urgency, frequency, hematuria and flank pain.       Negative for vaginal bleeding, vaginal discharge  Musculoskeletal: Negative.   Neurological: Positive for sensory change and focal weakness.  Psychiatric/Behavioral: Negative.    Physical Exam   Blood pressure 123/99, pulse 99, temperature 99.1 F (37.3 C),  temperature source Oral, resp. rate 20, last menstrual period 08/01/2011.  Physical Exam  Nursing note and vitals reviewed. Constitutional: She is oriented to person, place, and time. She appears well-developed and well-nourished. No distress.  Cardiovascular: Normal rate.   Respiratory: Effort normal.  GI: Soft. She exhibits no distension and no mass. There is tenderness (LLQ). There is guarding. There is no rebound.  Musculoskeletal: Normal range of motion.       Pt reports unable to stand/walk, but was able to get out of wheelchair and stand over toilet to collect urine sample on her own  Neurological: She is alert and oriented to person, place, and time. She has normal reflexes. No cranial nerve deficit or sensory deficit. She exhibits normal muscle tone. Coordination and gait normal.       Normal strength and sensation in bilateral lower extremities   Skin: Skin is warm and dry.  Psychiatric: She has a normal mood and affect.    MAU Course  Procedures Results for orders placed during the hospital encounter of 01/02/12 (from the past 24 hour(s))  CBC     Status: Abnormal   Collection Time   01/02/12 10:57 AM      Component Value Range   WBC 8.8  4.0 - 10.5 K/uL   RBC 4.17  3.87 - 5.11 MIL/uL   Hemoglobin 11.7 (*) 12.0 - 15.0 g/dL   HCT 78.2 (*) 95.6 -  46.0 %   MCV 81.1  78.0 - 100.0 fL   MCH 28.1  26.0 - 34.0 pg   MCHC 34.6  30.0 - 36.0 g/dL   RDW 16.1  09.6 - 04.5 %   Platelets 222  150 - 400 K/uL  URINALYSIS, ROUTINE W REFLEX MICROSCOPIC     Status: Abnormal   Collection Time   01/02/12 11:20 AM      Component Value Range   Color, Urine YELLOW  YELLOW   APPearance CLEAR  CLEAR   Specific Gravity, Urine 1.010  1.005 - 1.030   pH 8.5 (*) 5.0 - 8.0   Glucose, UA NEGATIVE  NEGATIVE mg/dL   Hgb urine dipstick NEGATIVE  NEGATIVE   Bilirubin Urine NEGATIVE  NEGATIVE   Ketones, ur NEGATIVE  NEGATIVE mg/dL   Protein, ur NEGATIVE  NEGATIVE mg/dL   Urobilinogen, UA 0.2  0.0 - 1.0  mg/dL   Nitrite NEGATIVE  NEGATIVE   Leukocytes, UA NEGATIVE  NEGATIVE    U/S: Cervical length 3.4 cm, Left corpus luteum, normal exam  Assessment and Plan  21 y.o. G1P0000 at [redacted]w[redacted]d Pelvic pain in pregnancy  Medication List  As of 01/02/2012  1:40 PM   START taking these medications         HYDROcodone-acetaminophen 5-325 MG per tablet   Commonly known as: NORCO/VICODIN   Take 1 tablet by mouth every 6 (six) hours as needed for pain.         CHANGE how you take these medications         ibuprofen 200 MG tablet   Commonly known as: ADVIL,MOTRIN   Take 3 tablets (600 mg total) by mouth every 6 (six) hours as needed for pain. For pain.   What changed: - dose - reasons to take the med          Where to get your medications    These are the prescriptions that you need to pick up. We sent them to a specific pharmacy, so you will need to go there to get them.   [facility] - [place] - [address]    [address] [place] ([place]) [place] [address]    Phone: [phone]        ibuprofen 200 MG tablet         You may get these medications from any pharmacy.         HYDROcodone-acetaminophen 5-325 MG per tablet           Follow-up Information    Follow up with Roseanna Rainbow, MD. Call in 1 day.   Contact information:   [address], [address] [place] [address] [phone]           Charlea Nardo 01/02/2012, 11:39 AM

## 2012-01-03 LAB — GLUCOSE, CAPILLARY: Glucose-Capillary: 87 mg/dL (ref 70–99)

## 2012-01-05 ENCOUNTER — Encounter (HOSPITAL_COMMUNITY): Payer: Self-pay | Admitting: *Deleted

## 2012-01-05 ENCOUNTER — Inpatient Hospital Stay (HOSPITAL_COMMUNITY)
Admit: 2012-01-05 | Discharge: 2012-01-05 | Disposition: A | Discharge status: Home / Self Care | Payer: Medicaid Other | Attending: Obstetrics & Gynecology | Admitting: Obstetrics & Gynecology

## 2012-01-05 ENCOUNTER — Inpatient Hospital Stay (HOSPITAL_COMMUNITY)
Admission: AD | Admit: 2012-01-05 | Discharge: 2012-01-05 | Disposition: A | Discharge status: Home / Self Care | Payer: Medicaid Other | Source: Ambulatory Visit | Attending: Obstetrics & Gynecology | Admitting: Obstetrics & Gynecology

## 2012-01-05 DIAGNOSIS — R1032 Left lower quadrant pain: Secondary | ICD-10-CM | POA: Insufficient documentation

## 2012-01-05 DIAGNOSIS — O99891 Other specified diseases and conditions complicating pregnancy: Secondary | ICD-10-CM | POA: Insufficient documentation

## 2012-01-05 DIAGNOSIS — W1809XA Striking against other object with subsequent fall, initial encounter: Secondary | ICD-10-CM | POA: Insufficient documentation

## 2012-01-05 LAB — URINALYSIS, ROUTINE W REFLEX MICROSCOPIC
Bilirubin Urine: NEGATIVE
Glucose, UA: NEGATIVE mg/dL
Hgb urine dipstick: NEGATIVE
Ketones, ur: NEGATIVE mg/dL
Protein, ur: NEGATIVE mg/dL
pH: 6.5 (ref 5.0–8.0)

## 2012-01-05 LAB — URINE MICROSCOPIC-ADD ON

## 2012-01-05 MED ORDER — LACTATED RINGERS IV SOLN
INTRAVENOUS | Status: DC
  Administered 2012-01-05: 16:00:00 via INTRAVENOUS

## 2012-01-05 MED ORDER — ONDANSETRON HCL 4 MG/2ML IJ SOLN
4.0000 mg | Freq: Once | INTRAMUSCULAR | Status: AC
  Administered 2012-01-05: 4 mg via INTRAVENOUS
  Filled 2012-01-05: qty 2

## 2012-01-05 MED ORDER — OXYCODONE-ACETAMINOPHEN 5-325 MG PO TABS
2.0000 | ORAL_TABLET | Freq: Once | ORAL | Status: AC
  Administered 2012-01-05: 2 via ORAL
  Filled 2012-01-05: qty 2

## 2012-01-05 NOTE — MAU Provider Note (Signed)
History     CSN: [id number]  Arrival date and time: 01/05/12 1353   First Provider Initiated Contact with Patient 01/05/12 1441      Chief Complaint  Patient presents with  . Abdominal Pain   HPI [name] 21 y.o. [redacted]w[redacted]d  Comes to MAU via EMS due to LLQ pain. Was at work today and fell to the floor with severe but periodic pain.  Having nausea but has not vomited.  States pain began last night.  Was seen on 01-02-12 for this pain.  Had ultrasound that day.  Client 's mother accompanies her and discussed at length the daughter's past medical history of surgery for a large ovarian cyst in 2010.  Mother is very concerned about today's pain and wants more testing done.  Requests to see MD.    OB History    Grav Para Term Preterm Abortions TAB SAB Ect Mult Living   1 0 0 0 0 0 0 0 0 0       Past Medical History  Diagnosis Date  . Hypoglycemia   . Hypoglycemia   . Ovarian cyst 2011  . Constipation   . Sickle cell trait     Past Surgical History  Procedure Date  . Laparoscopic ovarian cystectomy 2011  . Eye surgery     2004    Family History  Problem Relation Age of Onset  . Anesthesia problems Neg Hx   . Hearing loss Neg Hx   . Cancer Paternal Grandmother     breast    History  Substance Use Topics  . Smoking status: Never Smoker   . Smokeless tobacco: Never Used  . Alcohol Use: No    Allergies: No Known Allergies  Prescriptions prior to admission  Medication Sig Dispense Refill  . HYDROcodone-acetaminophen (NORCO/VICODIN) 5-325 MG per tablet Take 1 tablet by mouth every 6 (six) hours as needed for pain.  20 tablet  0  . ibuprofen (ADVIL,MOTRIN) 200 MG tablet Take 3 tablets (600 mg total) by mouth every 6 (six) hours as needed for pain. For pain.  30 tablet  1  . DISCONTD: promethazine (PHENERGAN) 25 MG tablet Take 1 tablet (25 mg total) by mouth every 6 (six) hours as needed for nausea.  60 tablet  0    ROS Physical Exam   Blood pressure 101/55, pulse  102, temperature 98 F (36.7 C), temperature source Oral, resp. rate 22, height 5\' 3"  (1.6 m), weight 136 lb (61.689 kg), last menstrual period 08/01/2011, unknown if currently breastfeeding.  Physical Exam  Nursing note and vitals reviewed. Constitutional: She is oriented to person, place, and time. She appears well-developed and well-nourished. She appears distressed.       Tearful, not making eye contact  HENT:  Head: Normocephalic.  Eyes: EOM are normal.  Neck: Neck supple.  GI: Soft. There is tenderness.       No tenderness in upper quadrants.  Client will not allow examiner to touch her abdomen in LLQ.  Mild palpation of RLQ causes pain in LLQ  Genitourinary:       Speculum exam: Vulva - negative Vagina - Small amount of creamy discharge, no odor, no bleeding Cervix - No contact bleeding Bimanual exam: Cervix closed, thick Uterus gravid, 20 week size Adnexa non tender, no masses bilaterally Chaperone present for exam.  Musculoskeletal: Normal range of motion.  Neurological: She is alert and oriented to person, place, and time.  Skin: Skin is warm and dry.  Psychiatric: She  has a normal mood and affect.    MAU Course  Procedures Results for orders placed during the hospital encounter of 01/05/12 (from the past 24 hour(s))  URINALYSIS, ROUTINE W REFLEX MICROSCOPIC     Status: Abnormal   Collection Time   01/05/12  2:05 PM      Component Value Range   Color, Urine YELLOW  YELLOW   APPearance CLEAR  CLEAR   Specific Gravity, Urine 1.015  1.005 - 1.030   pH 6.5  5.0 - 8.0   Glucose, UA NEGATIVE  NEGATIVE mg/dL   Hgb urine dipstick NEGATIVE  NEGATIVE   Bilirubin Urine NEGATIVE  NEGATIVE   Ketones, ur NEGATIVE  NEGATIVE mg/dL   Protein, ur NEGATIVE  NEGATIVE mg/dL   Urobilinogen, UA 0.2  0.0 - 1.0 mg/dL   Nitrite NEGATIVE  NEGATIVE   Leukocytes, UA SMALL (*) NEGATIVE  URINE MICROSCOPIC-ADD ON     Status: Abnormal   Collection Time   01/05/12  2:05 PM      Component  Value Range   Squamous Epithelial / LPF FEW (*) RARE   WBC, UA 7-10  <3 WBC/hpf   RBC / HPF 0-2  <3 RBC/hpf   Bacteria, UA FEW (*) RARE   Urine culture pending - client has voided repeatedly while in MAU.  Denies dysuria.  MDM 1415  Consult with Dr. Tamela Oddi re: plan of care 1500 Consult with Dr. Tamela Oddi re: plan of care 1520   Consult with Dr. Eppie Gibson re: previous MRI and ultrasounds since MRI 1540  Discussed results of previous MRI and ultrasounds with client.  Last ultrasound - good visualization of left ovary.  Dr. Eppie Gibson reviewed.  3 normal sized cysts in the left ovary.  Good flow.  No torsion.  Advised that Dr. Tamela Oddi and Dr. Eppie Gibson do not recommend MRI today.  Client continues to request MRI today due to the severity of the pain when it occurs. 1550  Now having repetitive vomiting.  Will give LR and Zofran. 1610  MRI planned for 5:30 pm at [facility] 1810  Dr. Tamela Oddi here but client has not returned from MRI 1914  Client back from MRI 2020  Report from MRI back and reviewed with client.  Dr. Tamela Oddi given MRI results  Assessment and Plan  LLQ pain in pregnancy - currently not in pain  Plan Call to make an appointment on Friday to be reevaluated in the office. Use your pain medication if needed  Urine culture pending   BURLESON,TERRI 01/05/2012, 2:54 PM

## 2012-01-05 NOTE — MAU Note (Signed)
Sharp intermittent abd pain started yesterday and had progressively gotten worse today since 1200.  Pt states all pain on left side that radiates into vagina area.  No vaginal bleeding, abnormal discharge or ROM.  Good fetal movement.

## 2012-01-05 NOTE — MAU Note (Signed)
Pt to [facility] for MRI.

## 2012-01-07 LAB — URINE CULTURE

## 2012-01-15 ENCOUNTER — Encounter (HOSPITAL_COMMUNITY): Payer: Self-pay | Admitting: *Deleted

## 2012-01-15 ENCOUNTER — Inpatient Hospital Stay (HOSPITAL_COMMUNITY)
Admission: AD | Admit: 2012-01-15 | Discharge: 2012-01-15 | Disposition: A | Discharge status: Home / Self Care | Payer: Medicaid Other | Source: Ambulatory Visit | Attending: Obstetrics & Gynecology | Admitting: Obstetrics & Gynecology

## 2012-01-15 DIAGNOSIS — K5900 Constipation, unspecified: Secondary | ICD-10-CM | POA: Insufficient documentation

## 2012-01-15 DIAGNOSIS — R109 Unspecified abdominal pain: Secondary | ICD-10-CM | POA: Insufficient documentation

## 2012-01-15 DIAGNOSIS — R1032 Left lower quadrant pain: Secondary | ICD-10-CM

## 2012-01-15 DIAGNOSIS — O99891 Other specified diseases and conditions complicating pregnancy: Secondary | ICD-10-CM | POA: Insufficient documentation

## 2012-01-15 LAB — URINALYSIS, ROUTINE W REFLEX MICROSCOPIC
Bilirubin Urine: NEGATIVE
Hgb urine dipstick: NEGATIVE
Ketones, ur: NEGATIVE mg/dL
Nitrite: NEGATIVE
Urobilinogen, UA: 0.2 mg/dL (ref 0.0–1.0)

## 2012-01-15 NOTE — MAU Provider Note (Signed)
  History     CSN: [id number]  Arrival date and time: 01/15/12 0715   None     Chief Complaint  Patient presents with  . Abdominal Pain   HPI This is a 21 y.o. female at [redacted]w[redacted]d who presents with c/o abdominal pain.  Mostly Left lower quadrant. States has had this pain since before pregnant. Denies nausea, vomiting, constipation or diarrhea. Denies bleeding or contractions. Hurts when moves.  States has "white fuzzy stuff" on her stool  Has had 11 visits for abdominal pain since May 2013.  Multiple tests have only revealed constipation and physiologic ovarian cysts.   OB History    Grav Para Term Preterm Abortions TAB SAB Ect Mult Living   1 0 0 0 0 0 0 0 0 0       Past Medical History  Diagnosis Date  . Hypoglycemia   . Hypoglycemia   . Ovarian cyst 2011  . Constipation   . Sickle cell trait     Past Surgical History  Procedure Date  . Laparoscopic ovarian cystectomy 2011  . Eye surgery     2004    Family History  Problem Relation Age of Onset  . Anesthesia problems Neg Hx   . Hearing loss Neg Hx   . Cancer Paternal Grandmother     breast    History  Substance Use Topics  . Smoking status: Never Smoker   . Smokeless tobacco: Never Used  . Alcohol Use: No    Allergies: No Known Allergies  Prescriptions prior to admission  Medication Sig Dispense Refill  . ibuprofen (ADVIL,MOTRIN) 200 MG tablet Take 3 tablets (600 mg total) by mouth every 6 (six) hours as needed for pain. For pain.  30 tablet  1    ROS See HPI  Physical Exam   Blood pressure 122/74, pulse 96, temperature 97.1 F (36.2 C), resp. rate 18, height 5\' 3"  (1.6 m), weight 131 lb 12.8 oz (59.784 kg), last menstrual period 08/19/2011.  Physical Exam  Constitutional: She is oriented to person, place, and time. She appears well-developed and well-nourished. No distress.  Cardiovascular: Normal rate.   Respiratory: Effort normal.  GI: Soft. She exhibits no distension and no mass. There is  tenderness (to palpation LLQ). There is no rebound and no guarding.  Genitourinary: Vagina normal and uterus normal. No vaginal discharge found.       Cervix long and closed   Musculoskeletal: Normal range of motion.  Neurological: She is alert and oriented to person, place, and time.  Skin: Skin is warm and dry.  Psychiatric: She has a normal mood and affect.    MAU Course  Procedures  Assessment and Plan  A:  SIUP at [redacted]w[redacted]d       LL Quadrant pain, long term, chronic      No evidence of acute findings      Probable constipation or ligament pain  P:  Discussed with Dr Gaynell Face.       He recommends conservative care       I suggested she might want to see a gastroenterologist at some point. She is interested in doing this.       Discharge home.  [facility] 01/15/2012, 7:27 AM

## 2012-01-15 NOTE — MAU Note (Signed)
Pt reports a sharp LLQ pain that gets worse when she moves.

## 2012-01-16 LAB — URINE CULTURE

## 2012-01-18 ENCOUNTER — Other Ambulatory Visit: Payer: Self-pay | Admitting: Obstetrics

## 2012-01-18 DIAGNOSIS — Z3689 Encounter for other specified antenatal screening: Secondary | ICD-10-CM

## 2012-01-27 ENCOUNTER — Ambulatory Visit (HOSPITAL_COMMUNITY)
Admission: RE | Admit: 2012-01-27 | Discharge: 2012-01-27 | Disposition: A | Discharge status: Home / Self Care | Payer: Medicaid Other | Source: Ambulatory Visit | Attending: Obstetrics | Admitting: Obstetrics

## 2012-01-27 DIAGNOSIS — Z1389 Encounter for screening for other disorder: Secondary | ICD-10-CM | POA: Insufficient documentation

## 2012-01-27 DIAGNOSIS — Z363 Encounter for antenatal screening for malformations: Secondary | ICD-10-CM | POA: Insufficient documentation

## 2012-01-27 DIAGNOSIS — O358XX0 Maternal care for other (suspected) fetal abnormality and damage, not applicable or unspecified: Secondary | ICD-10-CM | POA: Insufficient documentation

## 2012-01-27 DIAGNOSIS — Z3689 Encounter for other specified antenatal screening: Secondary | ICD-10-CM

## 2012-02-06 ENCOUNTER — Inpatient Hospital Stay (HOSPITAL_COMMUNITY)
Admission: AD | Admit: 2012-02-06 | Discharge: 2012-02-06 | Disposition: A | Discharge status: Home / Self Care | Payer: Medicaid Other | Source: Ambulatory Visit | Attending: Obstetrics | Admitting: Obstetrics

## 2012-02-06 ENCOUNTER — Encounter (HOSPITAL_COMMUNITY): Payer: Self-pay | Admitting: Obstetrics and Gynecology

## 2012-02-06 DIAGNOSIS — B9689 Other specified bacterial agents as the cause of diseases classified elsewhere: Secondary | ICD-10-CM | POA: Insufficient documentation

## 2012-02-06 DIAGNOSIS — O2390 Unspecified genitourinary tract infection in pregnancy, unspecified trimester: Secondary | ICD-10-CM | POA: Insufficient documentation

## 2012-02-06 DIAGNOSIS — N760 Acute vaginitis: Secondary | ICD-10-CM

## 2012-02-06 DIAGNOSIS — R109 Unspecified abdominal pain: Secondary | ICD-10-CM | POA: Insufficient documentation

## 2012-02-06 DIAGNOSIS — A499 Bacterial infection, unspecified: Secondary | ICD-10-CM

## 2012-02-06 LAB — URINALYSIS, ROUTINE W REFLEX MICROSCOPIC
Bilirubin Urine: NEGATIVE
Glucose, UA: NEGATIVE mg/dL
Ketones, ur: 15 mg/dL — AB
Nitrite: NEGATIVE
Protein, ur: NEGATIVE mg/dL
pH: 7.5 (ref 5.0–8.0)

## 2012-02-06 LAB — URINE MICROSCOPIC-ADD ON

## 2012-02-06 LAB — WET PREP, GENITAL

## 2012-02-06 MED ORDER — METRONIDAZOLE 500 MG PO TABS: 500.0000 mg | ORAL_TABLET | Freq: Two times a day (BID) | ORAL | Status: DC

## 2012-02-06 NOTE — MAU Provider Note (Signed)
History     CSN: [id number]  Arrival date and time: 02/06/12 1125   None     Chief Complaint  Patient presents with  . Abdominal Pain   HPI 21 y.o. G1P0000 at [redacted]w[redacted]d with intermittent abd pain since 6 am, q 15-20 min, quick pain, lasts a few seconds. No LOF or bleeding. + fetal movement.    Past Medical History  Diagnosis Date  . Hypoglycemia   . Hypoglycemia   . Ovarian cyst 2011  . Constipation   . Sickle cell trait     Past Surgical History  Procedure Date  . Laparoscopic ovarian cystectomy 2011  . Eye surgery     2004    Family History  Problem Relation Age of Onset  . Anesthesia problems Neg Hx   . Hearing loss Neg Hx   . Cancer Paternal Grandmother     breast    History  Substance Use Topics  . Smoking status: Never Smoker   . Smokeless tobacco: Never Used  . Alcohol Use: No    Allergies: No Known Allergies  Prescriptions prior to admission  Medication Sig Dispense Refill  . PRESCRIPTION MEDICATION Take 1 tablet by mouth every 6 (six) hours as needed. Vicodin- unknown strength (for pain)        Review of Systems  Constitutional: Negative.   Respiratory: Negative.   Cardiovascular: Negative.   Gastrointestinal: Positive for abdominal pain. Negative for nausea, vomiting, diarrhea and constipation.  Genitourinary: Negative for dysuria, urgency, frequency, hematuria and flank pain.       Negative for vaginal bleeding  Musculoskeletal: Negative.   Neurological: Negative.   Psychiatric/Behavioral: Negative.    Physical Exam   Blood pressure 113/65, pulse 92, temperature 98.6 F (37 C), temperature source Oral, resp. rate 16, height 5\' 4"  (1.626 m), weight 133 lb (60.328 kg), last menstrual period 08/19/2011, SpO2 98.00%.  Physical Exam  Nursing note and vitals reviewed. Constitutional: She is oriented to person, place, and time. She appears well-developed and well-nourished. No distress.  Cardiovascular: Normal rate.   Respiratory: Effort  normal.  GI: Soft. There is no tenderness.  Genitourinary: No bleeding around the vagina. Vaginal discharge (white) found.       Cervix closed, firm, long    Musculoskeletal: Normal range of motion.  Neurological: She is alert and oriented to person, place, and time.  Skin: Skin is warm and dry.  Psychiatric: She has a normal mood and affect.   EFM 145, mod, 10x10 accels TOCO quiet MAU Course  Procedures  Results for orders placed during the hospital encounter of 02/06/12 (from the past 24 hour(s))  URINALYSIS, ROUTINE W REFLEX MICROSCOPIC     Status: Abnormal   Collection Time   02/06/12 11:40 AM      Component Value Range   Color, Urine YELLOW  YELLOW   APPearance CLEAR  CLEAR   Specific Gravity, Urine 1.015  1.005 - 1.030   pH 7.5  5.0 - 8.0   Glucose, UA NEGATIVE  NEGATIVE mg/dL   Hgb urine dipstick NEGATIVE  NEGATIVE   Bilirubin Urine NEGATIVE  NEGATIVE   Ketones, ur 15 (*) NEGATIVE mg/dL   Protein, ur NEGATIVE  NEGATIVE mg/dL   Urobilinogen, UA 0.2  0.0 - 1.0 mg/dL   Nitrite NEGATIVE  NEGATIVE   Leukocytes, UA TRACE (*) NEGATIVE  URINE MICROSCOPIC-ADD ON     Status: Abnormal   Collection Time   02/06/12 11:40 AM      Component Value Range  Squamous Epithelial / LPF FEW (*) RARE   WBC, UA 3-6  <3 WBC/hpf   RBC / HPF 0-2  <3 RBC/hpf   Bacteria, UA RARE  RARE  WET PREP, GENITAL     Status: Abnormal   Collection Time   02/06/12 12:00 PM      Component Value Range   Yeast Wet Prep HPF POC NONE SEEN  NONE SEEN   Trich, Wet Prep NONE SEEN  NONE SEEN   Clue Cells Wet Prep HPF POC FEW (*) NONE SEEN   WBC, Wet Prep HPF POC FEW (*) NONE SEEN     Assessment and Plan   1. BV (bacterial vaginosis)   2. Abdominal pain in pregnancy       Medication List     As of 02/06/2012  7:29 PM    START taking these medications         metroNIDAZOLE 500 MG tablet   Commonly known as: FLAGYL   Take 1 tablet (500 mg total) by mouth 2 (two) times daily.          Where  to get your medications    These are the prescriptions that you need to pick up. We sent them to a specific pharmacy, so you will need to go there to get them.   [facility] - [place] ([place]), [place] - [address]    [address] [place] ([place] [address]    Phone: [phone]        metroNIDAZOLE 500 MG tablet            Follow-up Information    Follow up with Brock Bad, MD.   Contact information:   [address] [place] [address] [place] [address] [phone]       Follow up with Florencia Reasons, MD. (Gastroenterologist)    Contact information:   [address] [place] [place] [address] [phone]            Lateefa Crosby 02/06/2012, 11:41 AM

## 2012-02-06 NOTE — MAU Note (Signed)
Pt presents to MAU with chief complaint of abdominal cramping. Pt wants to Be sure this is not contraction pain that she is feeling. Pt says she woke up this morning around 0600 and felt pain/cramping in her abdomen that "comes and goes". Pt says this is new pain.

## 2012-02-06 NOTE — MAU Note (Signed)
Patient states she has had abdominal cramping all over the abdomen since 0600. Denies bleeding or discharge and reports fetal movement.

## 2012-02-08 ENCOUNTER — Inpatient Hospital Stay (HOSPITAL_COMMUNITY): Payer: Medicaid Other

## 2012-02-08 ENCOUNTER — Inpatient Hospital Stay (HOSPITAL_COMMUNITY)
Admission: AD | Admit: 2012-02-08 | Discharge: 2012-02-08 | Disposition: A | Discharge status: Home / Self Care | Payer: Medicaid Other | Source: Ambulatory Visit | Attending: Obstetrics | Admitting: Obstetrics

## 2012-02-08 ENCOUNTER — Encounter (HOSPITAL_COMMUNITY): Payer: Self-pay | Admitting: *Deleted

## 2012-02-08 DIAGNOSIS — O26899 Other specified pregnancy related conditions, unspecified trimester: Secondary | ICD-10-CM

## 2012-02-08 DIAGNOSIS — O479 False labor, unspecified: Secondary | ICD-10-CM

## 2012-02-08 DIAGNOSIS — R109 Unspecified abdominal pain: Secondary | ICD-10-CM | POA: Insufficient documentation

## 2012-02-08 DIAGNOSIS — O99891 Other specified diseases and conditions complicating pregnancy: Secondary | ICD-10-CM | POA: Insufficient documentation

## 2012-02-08 DIAGNOSIS — K5900 Constipation, unspecified: Secondary | ICD-10-CM

## 2012-02-08 LAB — URINALYSIS, ROUTINE W REFLEX MICROSCOPIC
Bilirubin Urine: NEGATIVE
Hgb urine dipstick: NEGATIVE
Nitrite: NEGATIVE
Protein, ur: NEGATIVE mg/dL
Specific Gravity, Urine: 1.01 (ref 1.005–1.030)
Urobilinogen, UA: 0.2 mg/dL (ref 0.0–1.0)

## 2012-02-08 LAB — CBC WITH DIFFERENTIAL/PLATELET
Basophils Absolute: 0 10*3/uL (ref 0.0–0.1)
Basophils Relative: 0 % (ref 0–1)
Eosinophils Absolute: 0.1 10*3/uL (ref 0.0–0.7)
Eosinophils Relative: 1 % (ref 0–5)
HCT: 33 % — ABNORMAL LOW (ref 36.0–46.0)
Hemoglobin: 11.4 g/dL — ABNORMAL LOW (ref 12.0–15.0)
MCH: 27.5 pg (ref 26.0–34.0)
MCHC: 34.5 g/dL (ref 30.0–36.0)
MCV: 79.7 fL (ref 78.0–100.0)
Monocytes Absolute: 0.9 10*3/uL (ref 0.1–1.0)
Monocytes Relative: 9 % (ref 3–12)
RDW: 12.8 % (ref 11.5–15.5)

## 2012-02-08 LAB — WET PREP, GENITAL: Clue Cells Wet Prep HPF POC: NONE SEEN

## 2012-02-08 MED ORDER — PROMETHAZINE HCL 25 MG/ML IJ SOLN
25.0000 mg | Freq: Once | INTRAMUSCULAR | Status: AC
  Administered 2012-02-08: 25 mg via INTRAMUSCULAR
  Filled 2012-02-08: qty 1

## 2012-02-08 MED ORDER — FLEET ENEMA 7-19 GM/118ML RE ENEM
1.0000 | ENEMA | Freq: Once | RECTAL | Status: AC
  Administered 2012-02-08: 1 via RECTAL

## 2012-02-08 MED ORDER — MORPHINE SULFATE 10 MG/ML IJ SOLN
10.0000 mg | Freq: Once | INTRAMUSCULAR | Status: AC
  Administered 2012-02-08: 10 mg via INTRAMUSCULAR
  Filled 2012-02-08: qty 1

## 2012-02-08 NOTE — MAU Provider Note (Signed)
History     CSN: [id number]  Arrival date and time: 02/08/12 1553   First Provider Initiated Contact with Patient 02/08/12 1635      Chief Complaint  Patient presents with  . Labor Eval   HPI [name] is 21 y.o. G1P0000 [redacted]w[redacted]d weeks presenting with abdominal pain that began last night but worsened about 1 hr ago.  Nausea without vomiting.  Denies vaginal bleeding but has seen mucous.  Denies urinary sxs.  Patient of Dr. Verdell Carmine.  She is very uncomfortable.  States she tried to call the office but they"turn off their phones at 3".   She reports she has not had a bowel movement in several days because she "hasn't paid it any attention".    Past Medical History  Diagnosis Date  . Hypoglycemia   . Ovarian cyst 2011  . Constipation   . Sickle cell trait     Past Surgical History  Procedure Date  . Laparoscopic ovarian cystectomy 2011  . Eye surgery     2004  . Coronary artery bypass graft     Family History  Problem Relation Age of Onset  . Anesthesia problems Neg Hx   . Hearing loss Neg Hx   . Cancer Paternal Grandmother     breast    History  Substance Use Topics  . Smoking status: Never Smoker   . Smokeless tobacco: Never Used  . Alcohol Use: No    Allergies: No Known Allergies  Prescriptions prior to admission  Medication Sig Dispense Refill  . metroNIDAZOLE (FLAGYL) 500 MG tablet Take 1 tablet (500 mg total) by mouth 2 (two) times daily.  14 tablet  0    Review of Systems  Constitutional: Negative for fever and chills.  Respiratory: Negative.   Cardiovascular: Negative.   Gastrointestinal: Positive for nausea and abdominal pain. Negative for vomiting.  Genitourinary:       + for vaginal mucous.  Neg for bleeding   Physical Exam   Blood pressure 128/81, pulse 114, temperature 97.1 F (36.2 C), temperature source Oral, resp. rate 18, height 5\' 4"  (1.626 m), weight 61.236 kg (135 lb), last menstrual period 08/19/2011.  Physical Exam    Constitutional: She is oriented to person, place, and time. She appears well-developed and well-nourished. She appears distressed.       Uncomfortable   HENT:  Head: Normocephalic.  Neck: Normal range of motion.  GI: Soft. There is tenderness (diffuse).  Genitourinary: Uterus is enlarged and tender. Cervix exhibits no motion tenderness. No erythema or bleeding around the vagina. No vaginal discharge found.       Cervical exam by D. Carter, RN-closed, soft-50 effaced  Neurological: She is alert and oriented to person, place, and time.  Skin: Skin is warm and dry.  Psychiatric: She has a normal mood and affect. Her behavior is normal.   Clinical Data: Severe abdominal pain. [redacted] weeks pregnant.  LIMITED OBSTETRIC ULTRASOUND  Number of Fetuses: 1  Heart Rate: 140 bpm  Movement: Present  Breathing:  Presentation: Cephalic  Placental Location: Posterior  Previa: None  Amniotic Fluid (Subjective): Normal.  Vertical Pocket 6.6 cm  BPD: 6.6 cm 26 w 5 d  MATERNAL FINDINGS:  Cervix: Closed  IMPRESSION:  Single living intrauterine fetus estimated at 26 weeks and 5 days  gestation.  Recommend followup with non-emergent complete OB 14+ wk US  examination for fetal biometric evaluation and anatomic survey if  not already performed.  Original Report Authenticated By: P. MARK  [name], M.D.     Results for orders placed during the hospital encounter of 02/08/12 (from the past 24 hour(s))  URINALYSIS, ROUTINE W REFLEX MICROSCOPIC     Status: Abnormal   Collection Time   02/08/12  1:55 PM      Component Value Range   Color, Urine YELLOW  YELLOW   APPearance CLEAR  CLEAR   Specific Gravity, Urine 1.010  1.005 - 1.030   pH 7.0  5.0 - 8.0   Glucose, UA NEGATIVE  NEGATIVE mg/dL   Hgb urine dipstick NEGATIVE  NEGATIVE   Bilirubin Urine NEGATIVE  NEGATIVE   Ketones, ur NEGATIVE  NEGATIVE mg/dL   Protein, ur NEGATIVE  NEGATIVE mg/dL   Urobilinogen, UA 0.2  0.0 - 1.0 mg/dL   Nitrite NEGATIVE   NEGATIVE   Leukocytes, UA SMALL (*) NEGATIVE  URINE MICROSCOPIC-ADD ON     Status: Abnormal   Collection Time   02/08/12  1:55 PM      Component Value Range   Squamous Epithelial / LPF FEW (*) RARE   WBC, UA 0-2  <3 WBC/hpf  WET PREP, GENITAL     Status: Abnormal   Collection Time   02/08/12  4:40 PM      Component Value Range   Yeast Wet Prep HPF POC NONE SEEN  NONE SEEN   Trich, Wet Prep NONE SEEN  NONE SEEN   Clue Cells Wet Prep HPF POC NONE SEEN  NONE SEEN   WBC, Wet Prep HPF POC FEW (*) NONE SEEN  CBC WITH DIFFERENTIAL     Status: Abnormal   Collection Time   02/08/12  4:59 PM      Component Value Range   WBC 10.3  4.0 - 10.5 K/uL   RBC 4.14  3.87 - 5.11 MIL/uL   Hemoglobin 11.4 (*) 12.0 - 15.0 g/dL   HCT 96.0 (*) 45.4 - 09.8 %   MCV 79.7  78.0 - 100.0 fL   MCH 27.5  26.0 - 34.0 pg   MCHC 34.5  30.0 - 36.0 g/dL   RDW 11.9  14.7 - 82.9 %   Platelets 222  150 - 400 K/uL   Neutrophils Relative 75  43 - 77 %   Neutro Abs 7.8 (*) 1.7 - 7.7 K/uL   Lymphocytes Relative 15  12 - 46 %   Lymphs Abs 1.5  0.7 - 4.0 K/uL   Monocytes Relative 9  3 - 12 %   Monocytes Absolute 0.9  0.1 - 1.0 K/uL   Eosinophils Relative 1  0 - 5 %   Eosinophils Absolute 0.1  0.0 - 0.7 K/uL   Basophils Relative 0  0 - 1 %   Basophils Absolute 0.0  0.0 - 0.1 K/uL    MAU Course  Procedures   FMS  Baseline FHR 150s.  Difficult to eval patient cannot stay still in the bed.    MDM 16:40  Bedside ultrasound ordered and CBCDIFF.   Reported MSE to Dr. [name].  Order given for Phenergan 25mg  Im and Morphine Sulfate 10mg  IM now.   18:15  Patient is resting comfortably--sleeping.   18:35  Reported labs, ultrasound, FMS findings as well as response to medication to Dr. [name].  Order given to give her a Fleets Enema when awake.  19:15  Patient is awake and reports pain is now gone.  Explained order for a Fleet's enema.  WIll give now. 20:00 Care turned over to P. Thad Ranger, MD Assessment and Plan  A:   Abdominal Pain at [redacted]w[redacted]d     Possibly constipation  P:  Keep scheduled appointment with Dr. [name].  KEY,EVE M 02/08/2012, 4:37 PM

## 2012-02-08 NOTE — MAU Note (Signed)
C/o abdominal cramping since last night around 2200;

## 2012-02-21 ENCOUNTER — Inpatient Hospital Stay (HOSPITAL_COMMUNITY)
Admission: AD | Admit: 2012-02-21 | Discharge: 2012-02-21 | Disposition: A | Discharge status: Home / Self Care | Payer: Medicaid Other | Source: Ambulatory Visit | Attending: Obstetrics & Gynecology | Admitting: Obstetrics & Gynecology

## 2012-02-21 ENCOUNTER — Encounter (HOSPITAL_COMMUNITY): Payer: Self-pay | Admitting: *Deleted

## 2012-02-21 DIAGNOSIS — R079 Chest pain, unspecified: Secondary | ICD-10-CM | POA: Insufficient documentation

## 2012-02-21 DIAGNOSIS — K219 Gastro-esophageal reflux disease without esophagitis: Secondary | ICD-10-CM | POA: Insufficient documentation

## 2012-02-21 DIAGNOSIS — O99891 Other specified diseases and conditions complicating pregnancy: Secondary | ICD-10-CM | POA: Insufficient documentation

## 2012-02-21 MED ORDER — GI COCKTAIL ~~LOC~~
30.0000 mL | Freq: Once | ORAL | Status: AC
  Administered 2012-02-21: 30 mL via ORAL
  Filled 2012-02-21: qty 30

## 2012-02-21 MED ORDER — ACETAMINOPHEN 500 MG PO TABS
1000.0000 mg | ORAL_TABLET | Freq: Once | ORAL | Status: AC
  Administered 2012-02-21: 1000 mg via ORAL
  Filled 2012-02-21: qty 2

## 2012-02-21 NOTE — MAU Provider Note (Signed)
Chief Complaint:  No chief complaint on file.   First Provider Initiated Contact with Patient 02/21/12 1126      HPI: [name] is a 21 y.o. G1P0000 at [redacted]w[redacted]d who presents to maternity admissions reporting onset of substernal chest tightness last night and still present when awakening today. She states sitting up makes the pain a little better. She's not had any analgesic. She also has a headache and fullness in her ears. She also has lower abdominal and pelvic pressure. Denies upper abdominal pain. She denies shortness of breath, cough, fever. Denies contractions, leakage of fluid or vaginal bleeding. Good fetal movement.   Pregnancy Course:   Past Medical History: Past Medical History  Diagnosis Date  . Hypoglycemia   . Ovarian cyst 2011  . Constipation   . Sickle cell trait     Past obstetric history: OB History    Grav Para Term Preterm Abortions TAB SAB Ect Mult Living   1 0 0 0 0 0 0 0 0 0      # Outc Date GA Lbr Len/2nd Wgt Sex Del Anes PTL Lv   1 CUR               Past Surgical History: Past Surgical History  Procedure Date  . Laparoscopic ovarian cystectomy 2011  . Eye surgery     2004  . Coronary artery bypass graft     Family History: Family History  Problem Relation Age of Onset  . Anesthesia problems Neg Hx   . Hearing loss Neg Hx   . Cancer Paternal Grandmother     breast    Social History: History  Substance Use Topics  . Smoking status: Never Smoker   . Smokeless tobacco: Never Used  . Alcohol Use: No    Allergies: No Known Allergies  Meds:  No prescriptions prior to admission    ROS: Pertinent findings in history of present illness.  Physical Exam  Blood pressure 107/66, pulse 89, temperature 98 F (36.7 C), temperature source Oral, resp. rate 16, height 5\' 4"  (1.626 m), weight 63.141 kg (139 lb 3.2 oz), last menstrual period 08/19/2011, SpO2 98.00%. GENERAL: Well-developed, well-nourished female in no acute distress.  HEENT:  normocephalic HEART: RRR w/o murmur RESP: normal effort, CTAB ABDOMEN: Soft, non-tender, gravid appropriate for gestational age EXTREMITIES: Nontender, no edema NEURO: alert and oriented SPECULUM EXAM: NEFG, physiologic discharge, no blood, cervix clean  Dilation: Closed Effacement (%): Thick Cervical Position: Posterior Presentation: Vertex Exam by:: D Poe CNM  FHT:  Baseline 140 , moderate variability, accelerations present, no decelerations Contractions: none   Labs: No results found for this or any previous visit (from the past 24 hour(s)).  Imaging:  US Ob Detail + 14 Wk  01/27/2012  OBSTETRICAL ULTRASOUND: This exam was performed within a [facility]. The OB US report was generated in the [facility] system, and faxed to the ordering physician.   This report is also available in [employer] and in the [employer]. See [facility] Obstetric US report.   US Ob Limited  02/08/2012  *RADIOLOGY REPORT*  Clinical Data: Severe abdominal pain.  [redacted] weeks pregnant.  LIMITED OBSTETRIC ULTRASOUND  Number of Fetuses: 1 Heart Rate: 140 bpm Movement: Present Breathing: Presentation: Cephalic Placental Location: Posterior Previa: None Amniotic Fluid (Subjective): Normal.  Vertical Pocket 6.6 cm  BPD:  6.6 cm      26 w  5 d  MATERNAL FINDINGS: Cervix: Closed  IMPRESSION: Single living intrauterine fetus  estimated at 26 weeks and 5 days gestation.  Recommend followup with non-emergent complete OB 14+ wk US examination for fetal biometric evaluation and anatomic survey if not already performed.   Original Report Authenticated By: P. Loralie Champagne, M.D.    MAU Course: Acetaminophen 100 mg and GI cocktail given -> H/A and chest discomfort much improved  Assessment: 1. Acid reflux   G1 at [redacted]w[redacted]d ategory 1 FHR  Plan: Discharge home with reassuranace Preterm labor precautions and fetal kick counts Advised to try Tums or liquid antacids    Medication List     As of  02/21/2012  2:25 PM       Deirdre Colin Mulders, CNM 02/21/2012 11:36 AM

## 2012-02-24 ENCOUNTER — Inpatient Hospital Stay (HOSPITAL_COMMUNITY): Payer: Medicaid Other

## 2012-02-24 ENCOUNTER — Inpatient Hospital Stay (HOSPITAL_COMMUNITY)
Admission: AD | Admit: 2012-02-24 | Discharge: 2012-02-25 | Disposition: A | Discharge status: Home / Self Care | Payer: Medicaid Other | Source: Ambulatory Visit | Attending: Obstetrics | Admitting: Obstetrics

## 2012-02-24 ENCOUNTER — Encounter (HOSPITAL_COMMUNITY): Payer: Self-pay | Admitting: *Deleted

## 2012-02-24 DIAGNOSIS — O9989 Other specified diseases and conditions complicating pregnancy, childbirth and the puerperium: Secondary | ICD-10-CM

## 2012-02-24 DIAGNOSIS — R109 Unspecified abdominal pain: Secondary | ICD-10-CM | POA: Insufficient documentation

## 2012-02-24 DIAGNOSIS — W19XXXA Unspecified fall, initial encounter: Secondary | ICD-10-CM

## 2012-02-24 DIAGNOSIS — W010XXA Fall on same level from slipping, tripping and stumbling without subsequent striking against object, initial encounter: Secondary | ICD-10-CM | POA: Insufficient documentation

## 2012-02-24 DIAGNOSIS — Z3400 Encounter for supervision of normal first pregnancy, unspecified trimester: Secondary | ICD-10-CM

## 2012-02-24 DIAGNOSIS — Z9181 History of falling: Secondary | ICD-10-CM

## 2012-02-24 DIAGNOSIS — N949 Unspecified condition associated with female genital organs and menstrual cycle: Secondary | ICD-10-CM | POA: Insufficient documentation

## 2012-02-24 DIAGNOSIS — O99891 Other specified diseases and conditions complicating pregnancy: Secondary | ICD-10-CM | POA: Insufficient documentation

## 2012-02-24 MED ORDER — ACETAMINOPHEN 500 MG PO TABS
1000.0000 mg | ORAL_TABLET | Freq: Once | ORAL | Status: AC
  Administered 2012-02-24: 1000 mg via ORAL
  Filled 2012-02-24: qty 2

## 2012-02-24 NOTE — MAU Note (Signed)
Pt fell about 1hr ago, landed on stomach.  Having lower abd pain now.  Denies bleeding.

## 2012-02-24 NOTE — MAU Provider Note (Addendum)
History     CSN: [id number]  Arrival date and time: 02/24/12 2155   None     Chief Complaint  Patient presents with  . Fall  . Abdominal Pain   HPI [name] is a 21 y.o. G1P0000 at [redacted]w[redacted]d who fell on abdomen about 1.5 hours prior to presentation. She was wearing high heels and tripped, falling forward on ground/grass. Landed on abdomen. Now having sharp vaginal pains when walking/standing, better at rest/lying down.  She has had these pains earlier this week in pregnancy but they are worse since fall. Denies bleeding, contractions or cramping. Baby moving well. Saw Dr. [name] 10/24, next visit 11/7.  Normal pregnancy to date except for a lot of pelvic/vaginal pain throughout pregnancy.  Pt blood type O positive.  OB History    Grav Para Term Preterm Abortions TAB SAB Ect Mult Living   1 0 0 0 0 0 0 0 0 0       Past Medical History  Diagnosis Date  . Hypoglycemia   . Ovarian cyst 2011  . Constipation   . Sickle cell trait     Past Surgical History  Procedure Date  . Laparoscopic ovarian cystectomy 2011  . Eye surgery     2004  . 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  Family History  Problem Relation Age of Onset  . Anesthesia problems Neg Hx   . Hearing loss Neg Hx   . Cancer Paternal Grandmother     breast    History  Substance Use Topics  . Smoking status: Never Smoker   . Smokeless tobacco: Never Used  . Alcohol Use: No    Allergies: No Known Allergies  No prescriptions prior to admission    Review of Systems  Constitutional: Negative for fever and chills.  Eyes: Negative for blurred vision and double vision.  Gastrointestinal: Negative for nausea, vomiting and abdominal pain.  Genitourinary: Negative for dysuria.  Neurological: Negative for headaches.   Physical Exam   Blood pressure 113/81, pulse 110, temperature 97.6 F (36.4 C), temperature source Oral, resp. rate 18, height 5\' 3"  (1.6 m), weight 62.959 kg (138 lb 12.8  oz), last menstrual period 08/19/2011.  Physical Exam  Constitutional: She is oriented to person, place, and time. She appears well-developed and well-nourished. No distress.       Pt has facial expression that looks like she is in pain or about to cry.  HENT:  Head: Normocephalic and atraumatic.  Eyes: Conjunctivae normal and EOM are normal.  Neck: Normal range of motion. Neck supple.  Cardiovascular: Normal rate, regular rhythm and normal heart sounds.   Respiratory: Effort normal and breath sounds normal. No respiratory distress.  GI:       Gravid, no bruising or abrasions. Very tender on LLQ (states this is not new). No rebound.  Musculoskeletal: Normal range of motion. She exhibits no edema and no tenderness.  Neurological: She is alert and oriented to person, place, and time.  Skin: Skin is warm and dry.  Psychiatric: She has a normal mood and affect.   Pt is walking stooped over and holding right side.  MAU Course  Procedures  NST: Started 11:15 PM Baseline 140-145, moderate variability, accels present, no decels Toco:  Occasional contraction, irritability.  *RADIOLOGY REPORT*  Clinical Data: Fall on abdomen.  LIMITED OBSTETRIC ULTRASOUND  Number of Fetuses: 1  Heart Rate: 150 bpm  Movement: Present  Breathing: Not assessed  Presentation: Breech  Placental Location:  Posterior and fundal  Previa: No  Amniotic Fluid (Subjective): Normal  Vertical Pocket 7 cm  MATERNAL FINDINGS:  Cervix: Closed  Uterus/Adnexae: Not evaluated on today's exam.  IMPRESSION:  1. Single intrauterine pregnancy.  2. No evidence for acute trauma.  3. The cervix is closed.  4. The placenta is posterior.  Recommend followup with non-emergent complete OB 14+ wk US  examination for fetal biometric evaluation and anatomic survey if  not already performed.  Original Report Authenticated By: Marin Roberts, M.D.    Blood type O+  Results for orders placed during the hospital encounter  of 02/24/12 (from the past 24 hour(s))  CBC     Status: Abnormal   Collection Time   02/25/12 12:05 AM      Component Value Range   WBC 12.0 (*) 4.0 - 10.5 K/uL   RBC 3.88  3.87 - 5.11 MIL/uL   Hemoglobin 10.5 (*) 12.0 - 15.0 g/dL   HCT 16.1 (*) 09.6 - 04.5 %   MCV 79.6  78.0 - 100.0 fL   MCH 27.1  26.0 - 34.0 pg   MCHC 34.0  30.0 - 36.0 g/dL   RDW 40.9  81.1 - 91.4 %   Platelets 199  150 - 400 K/uL   12:25 Spoke with Dr. [name] and reviewed current NST, labs. Dr. [name] agrees with sending patient home at this time with plan to FU in the office tomorrow.  Assessment and Plan  21 y.o. G1P0000 at [redacted]w[redacted]d with fall/trauma to abdomen.  Signed out to Thressa Sheller, CNMW at 11:55 PM  [place]on Form 02/24/2012, 10:58 PM    1. Fall with no injury   2. Supervision of normal first pregnancy     Bleeding precautions reviewed.  Pt will FU with Dr. [name] in the office on 02/25/12

## 2012-02-25 LAB — CBC
HCT: 30.9 % — ABNORMAL LOW (ref 36.0–46.0)
MCH: 27.1 pg (ref 26.0–34.0)
MCV: 79.6 fL (ref 78.0–100.0)
RBC: 3.88 MIL/uL (ref 3.87–5.11)
RDW: 12.9 % (ref 11.5–15.5)
WBC: 12 10*3/uL — ABNORMAL HIGH (ref 4.0–10.5)

## 2012-03-10 ENCOUNTER — Inpatient Hospital Stay (HOSPITAL_COMMUNITY)
Admission: AD | Admit: 2012-03-10 | Discharge: 2012-03-10 | Disposition: A | Discharge status: Home / Self Care | Payer: Medicaid Other | Source: Ambulatory Visit | Attending: Obstetrics | Admitting: Obstetrics

## 2012-03-10 ENCOUNTER — Inpatient Hospital Stay (HOSPITAL_COMMUNITY): Payer: Medicaid Other

## 2012-03-10 ENCOUNTER — Encounter (HOSPITAL_COMMUNITY): Payer: Self-pay | Admitting: *Deleted

## 2012-03-10 DIAGNOSIS — O4700 False labor before 37 completed weeks of gestation, unspecified trimester: Secondary | ICD-10-CM | POA: Insufficient documentation

## 2012-03-10 LAB — URINALYSIS, ROUTINE W REFLEX MICROSCOPIC
Bilirubin Urine: NEGATIVE
Hgb urine dipstick: NEGATIVE
Ketones, ur: NEGATIVE mg/dL
Specific Gravity, Urine: 1.015 (ref 1.005–1.030)
pH: 7.5 (ref 5.0–8.0)

## 2012-03-10 LAB — URINE MICROSCOPIC-ADD ON

## 2012-03-10 MED ORDER — NIFEDIPINE 10 MG PO CAPS
10.0000 mg | ORAL_CAPSULE | ORAL | Status: DC | PRN
  Administered 2012-03-10 (×2): 10 mg via ORAL
  Filled 2012-03-10 (×2): qty 1

## 2012-03-10 NOTE — MAU Provider Note (Signed)
History     CSN: [id number]  Arrival date and time: 03/10/12 7829   First Provider Initiated Contact with Patient 03/10/12 1047      Chief Complaint  Patient presents with  . Contractions   HPI  Pt is G1P0000 who presents with contractions on and off since yesterday morning.  She denies LOF, spotting or bleeding.  She last had intercourse yesterday afternoon.  She denies constipation or diarrhea, or UTI symptoms.  Past Medical History  Diagnosis Date  . Hypoglycemia   . Ovarian cyst 2011  . Constipation   . Sickle cell trait     Past Surgical History  Procedure Date  . Laparoscopic ovarian cystectomy 2011  . Eye surgery     2004    Family History  Problem Relation Age of Onset  . Anesthesia problems Neg Hx   . Hearing loss Neg Hx   . Cancer Paternal Grandmother     breast    History  Substance Use Topics  . Smoking status: Never Smoker   . Smokeless tobacco: Never Used  . Alcohol Use: No    Allergies: No Known Allergies  No prescriptions prior to admission    Review of Systems  Constitutional: Negative for fever and chills.  Gastrointestinal: Positive for abdominal pain. Negative for diarrhea and constipation.  Genitourinary: Negative for dysuria and urgency.  Neurological: Positive for headaches.   Physical Exam   Blood pressure 120/72, pulse 87, temperature 98.1 F (36.7 C), temperature source Oral, resp. rate 18, height 5\' 2"  (1.575 m), weight 144 lb (65.318 kg), last menstrual period 08/19/2011.  Physical Exam  Vitals reviewed. Constitutional: She is oriented to person, place, and time. She appears well-developed and well-nourished.  HENT:  Head: Normocephalic.  Eyes: Pupils are equal, round, and reactive to light.  Neck: Normal range of motion. Neck supple.  Cardiovascular: Normal rate.   Respiratory: Effort normal.  GI: Soft.       Pt has left mid quadrant tenderness- always due to colon issues to be evaluated after pregnancy;  FHR  appropriate for gestational age; uterine irritability and occ ctx noted.  Genitourinary:       Cervix closed, long; baby low at -2 station  Musculoskeletal: Normal range of motion.  Neurological: She is alert and oriented to person, place, and time.  Skin: Skin is warm and dry.  Psychiatric: She has a normal mood and affect.    MAU Course  Procedures Results for orders placed during the hospital encounter of 03/10/12 (from the past 24 hour(s))  URINALYSIS, ROUTINE W REFLEX MICROSCOPIC     Status: Abnormal   Collection Time   03/10/12 10:05 AM      Component Value Range   Color, Urine YELLOW  YELLOW   APPearance CLEAR  CLEAR   Specific Gravity, Urine 1.015  1.005 - 1.030   pH 7.5  5.0 - 8.0   Glucose, UA NEGATIVE  NEGATIVE mg/dL   Hgb urine dipstick NEGATIVE  NEGATIVE   Bilirubin Urine NEGATIVE  NEGATIVE   Ketones, ur NEGATIVE  NEGATIVE mg/dL   Protein, ur NEGATIVE  NEGATIVE mg/dL   Urobilinogen, UA 0.2  0.0 - 1.0 mg/dL   Nitrite NEGATIVE  NEGATIVE   Leukocytes, UA SMALL (*) NEGATIVE  URINE MICROSCOPIC-ADD ON     Status: Abnormal   Collection Time   03/10/12 10:05 AM      Component Value Range   Squamous Epithelial / LPF RARE  RARE   WBC, UA 3-6  <  3 WBC/hpf   RBC / HPF 0-2  <3 RBC/hpf   Bacteria, UA MANY (*) RARE  cannot do Fetal fibronectin due to sexual intercourse yesterday afternoon Discussed with Dr. [name]; will give Procardia 10 mg q prn >5 ctx/hr Ultrasound showed normal cervical length legth Pt was improvement with pain after 1st dose of Procardia but still having considerable uterine irritability- 2nd dose of procardia given with improvement in pain and pt ready to go home Assessment and Plan  Threatened pre term labor- pelvic rest abd pain in pregnancy- f/u if increase in paint; encouraged hydration F/u with Dr. [name] for [facility] appointment  [facility] 03/10/2012, 10:49 AM

## 2012-03-10 NOTE — MAU Note (Signed)
Pt reports having ctx off and on since yesterday.  Denies vag bleeding or discharge. Reports good fetal movement

## 2012-03-11 LAB — URINE CULTURE

## 2012-03-14 ENCOUNTER — Encounter (HOSPITAL_COMMUNITY): Payer: Self-pay | Admitting: *Deleted

## 2012-03-14 ENCOUNTER — Inpatient Hospital Stay (HOSPITAL_COMMUNITY)
Admission: AD | Admit: 2012-03-14 | Discharge: 2012-03-14 | Disposition: A | Discharge status: Home / Self Care | Payer: Medicaid Other | Source: Ambulatory Visit | Attending: Obstetrics | Admitting: Obstetrics

## 2012-03-14 DIAGNOSIS — O479 False labor, unspecified: Secondary | ICD-10-CM | POA: Insufficient documentation

## 2012-03-14 NOTE — MAU Note (Signed)
Patient states that yesterday she was having contractions and had a spot of blood. Called her MD and he told her to come to MAU for evaluation. Patient states she felt so bad she could not come. Today patient denies any pain or bleeding and reports good fetal movement.

## 2012-03-24 ENCOUNTER — Inpatient Hospital Stay (HOSPITAL_COMMUNITY)
Admission: AD | Admit: 2012-03-24 | Discharge: 2012-03-29 | DRG: 778 | Disposition: A | Discharge status: Home / Self Care | Payer: Medicaid Other | Source: Ambulatory Visit | Attending: Obstetrics | Admitting: Obstetrics

## 2012-03-24 ENCOUNTER — Encounter (HOSPITAL_COMMUNITY): Payer: Self-pay

## 2012-03-24 DIAGNOSIS — M545 Low back pain, unspecified: Secondary | ICD-10-CM | POA: Diagnosis present

## 2012-03-24 DIAGNOSIS — O4700 False labor before 37 completed weeks of gestation, unspecified trimester: Principal | ICD-10-CM | POA: Diagnosis present

## 2012-03-24 LAB — URINE MICROSCOPIC-ADD ON

## 2012-03-24 LAB — AMNISURE RUPTURE OF MEMBRANE (ROM) NOT AT ARMC: Amnisure ROM: NEGATIVE

## 2012-03-24 LAB — CBC
MCH: 26.6 pg (ref 26.0–34.0)
MCHC: 34 g/dL (ref 30.0–36.0)
Platelets: 223 10*3/uL (ref 150–400)
RBC: 4.43 MIL/uL (ref 3.87–5.11)

## 2012-03-24 LAB — WET PREP, GENITAL
Clue Cells Wet Prep HPF POC: NONE SEEN
Trich, Wet Prep: NONE SEEN
Yeast Wet Prep HPF POC: NONE SEEN

## 2012-03-24 LAB — URINALYSIS, ROUTINE W REFLEX MICROSCOPIC
Bilirubin Urine: NEGATIVE
Glucose, UA: NEGATIVE mg/dL
Hgb urine dipstick: NEGATIVE
Nitrite: NEGATIVE
Specific Gravity, Urine: 1.015 (ref 1.005–1.030)
pH: 7 (ref 5.0–8.0)

## 2012-03-24 MED ORDER — LACTATED RINGERS IV BOLUS (SEPSIS)
500.0000 mL | INTRAVENOUS | Status: AC
  Administered 2012-03-24: 500 mL via INTRAVENOUS

## 2012-03-24 MED ORDER — NIFEDIPINE 10 MG PO CAPS
20.0000 mg | ORAL_CAPSULE | ORAL | Status: AC
  Administered 2012-03-24: 20 mg via ORAL

## 2012-03-24 MED ORDER — NIFEDIPINE 10 MG PO CAPS
ORAL_CAPSULE | ORAL | Status: AC
  Administered 2012-03-24: 20 mg
  Filled 2012-03-24: qty 2

## 2012-03-24 MED ORDER — MAGNESIUM SULFATE 40 G IN LACTATED RINGERS - SIMPLE
2.0000 g/h | INTRAVENOUS | Status: DC
  Administered 2012-03-25: 2 g/h via INTRAVENOUS
  Filled 2012-03-24 (×2): qty 500

## 2012-03-24 MED ORDER — ZOLPIDEM TARTRATE 5 MG PO TABS
5.0000 mg | ORAL_TABLET | Freq: Every evening | ORAL | Status: DC | PRN
  Administered 2012-03-25 – 2012-03-28 (×3): 5 mg via ORAL
  Filled 2012-03-24 (×3): qty 1

## 2012-03-24 MED ORDER — BETAMETHASONE SOD PHOS & ACET 6 (3-3) MG/ML IJ SUSP
12.0000 mg | INTRAMUSCULAR | Status: AC
  Administered 2012-03-24 – 2012-03-25 (×2): 12 mg via INTRAMUSCULAR
  Filled 2012-03-24 (×2): qty 2

## 2012-03-24 MED ORDER — PRENATAL MULTIVITAMIN CH
1.0000 | ORAL_TABLET | Freq: Every day | ORAL | Status: DC
Start: 2012-03-24 — End: 2012-03-29
  Filled 2012-03-24 (×3): qty 1

## 2012-03-24 MED ORDER — LACTATED RINGERS IV SOLN
INTRAVENOUS | Status: DC
  Administered 2012-03-24 – 2012-03-25 (×3): via INTRAVENOUS
  Administered 2012-03-26: 100 mL/h via INTRAVENOUS

## 2012-03-24 MED ORDER — DEXTROSE 5 % IV SOLN
500.0000 mg | Freq: Once | INTRAVENOUS | Status: AC
  Administered 2012-03-24: 500 mg via INTRAVENOUS
  Filled 2012-03-24: qty 500

## 2012-03-24 MED ORDER — ACETAMINOPHEN 325 MG PO TABS
650.0000 mg | ORAL_TABLET | ORAL | Status: DC | PRN
  Administered 2012-03-28: 650 mg via ORAL
  Filled 2012-03-24: qty 2

## 2012-03-24 MED ORDER — PROMETHAZINE HCL 25 MG/ML IJ SOLN
25.0000 mg | Freq: Once | INTRAMUSCULAR | Status: AC
  Administered 2012-03-24: 25 mg via INTRAMUSCULAR
  Filled 2012-03-24 (×2): qty 1

## 2012-03-24 MED ORDER — MORPHINE SULFATE 10 MG/ML IJ SOLN
10.0000 mg | Freq: Once | INTRAMUSCULAR | Status: AC
  Administered 2012-03-24: 10 mg via INTRAVENOUS
  Filled 2012-03-24 (×2): qty 1

## 2012-03-24 MED ORDER — DOCUSATE SODIUM 100 MG PO CAPS
100.0000 mg | ORAL_CAPSULE | Freq: Every day | ORAL | Status: DC
  Filled 2012-03-24 (×3): qty 1

## 2012-03-24 MED ORDER — MAGNESIUM SULFATE BOLUS VIA INFUSION
4.0000 g | Freq: Once | INTRAVENOUS | Status: AC
  Administered 2012-03-24: 4 g via INTRAVENOUS
  Filled 2012-03-24: qty 500

## 2012-03-24 MED ORDER — CALCIUM CARBONATE ANTACID 500 MG PO CHEW
2.0000 | CHEWABLE_TABLET | ORAL | Status: DC | PRN
  Administered 2012-03-27 – 2012-03-28 (×2): 400 mg via ORAL
  Filled 2012-03-24 (×2): qty 2

## 2012-03-24 MED ORDER — SODIUM CHLORIDE 0.9 % IV SOLN
2.0000 g | Freq: Four times a day (QID) | INTRAVENOUS | Status: DC
  Administered 2012-03-24 – 2012-03-27 (×11): 2 g via INTRAVENOUS
  Filled 2012-03-24 (×12): qty 2000

## 2012-03-24 MED ORDER — DEXTROSE 5 % IV SOLN
250.0000 mg | INTRAVENOUS | Status: DC
  Administered 2012-03-25 – 2012-03-26 (×2): 250 mg via INTRAVENOUS
  Filled 2012-03-24 (×3): qty 250

## 2012-03-24 NOTE — MAU Provider Note (Signed)
  History     CSN: [id number]  Arrival date and time: 03/24/12 1550   First Provider Initiated Contact with Patient 03/24/12 1640      Chief Complaint  Patient presents with  . Abdominal Pain   HPI  [name] is a 21 y.o. G1P0000 who presents today with uterine contractions. She states that she began noticing contractions around 0800 and at 1500 they were much stronger. Baby is moving normally, denies vaginal bleeding, denies LOF.   Past Medical History  Diagnosis Date  . Hypoglycemia   . Ovarian cyst 2011  . Constipation   . Sickle cell trait     Past Surgical History  Procedure Date  . Laparoscopic ovarian cystectomy 2011  . Eye surgery     2004    Family History  Problem Relation Age of Onset  . Anesthesia problems Neg Hx   . Hearing loss Neg Hx   . Cancer Paternal Grandmother     breast    History  Substance Use Topics  . Smoking status: Never Smoker   . Smokeless tobacco: Never Used  . Alcohol Use: No    Allergies: No Known Allergies  No prescriptions prior to admission    Review of Systems  Constitutional: Negative for fever.  Gastrointestinal: Positive for diarrhea. Negative for nausea, vomiting and constipation.  Genitourinary: Negative for dysuria, urgency and frequency.   Physical Exam   Blood pressure 123/82, pulse 96, temperature 98.2 F (36.8 C), temperature source Oral, resp. rate 22, last menstrual period 08/19/2011.  Physical Exam  Nursing note and vitals reviewed. Constitutional: She is oriented to person, place, and time. She appears well-developed and well-nourished.  Cardiovascular: Normal rate.   Respiratory: Effort normal.  GI: Soft. She exhibits no distension. There is no tenderness.  Genitourinary:       Breathing through contractions.  Contractions q1-2 mins X 60-70 seconds FHT: 140, moderate variability. No decels.  External: normal Vagina: normal. No pooling of fluid seen Cervix: visually 1-2 cm.  No fluid  seen from os with valsalva. Uterus: AGA  Neurological: She is alert and oriented to person, place, and time.  Skin: Skin is warm and dry.    MAU Course  Procedures  1650: Spoke with Dr. [name] to inform him the patient was here and contracting. Results pending.  1725: Called Dr. [name]. Orders for admission to antenatal given  Assessment and Plan   1. Preterm uterine contractions, antepartum   Admit to antenatal   Tawnya Crook 03/24/2012, 4:40 PM

## 2012-03-24 NOTE — MAU Note (Signed)
Pt states noted pain that began at 0800, became severe at 1500 this afternoon, notes mucus like discharge, denies blood. Last intercourse 3 d ago. No prior hx of ptl.

## 2012-03-24 NOTE — MAU Note (Signed)
Started cramping this morning (on sides and back), had loose stools x2; pain has gotten worse since 1500-pain is in mid abd.

## 2012-03-25 ENCOUNTER — Encounter (HOSPITAL_COMMUNITY): Payer: Self-pay | Admitting: Obstetrics

## 2012-03-25 LAB — URINE CULTURE: Colony Count: 45000

## 2012-03-25 MED ORDER — ONDANSETRON 8 MG/NS 50 ML IVPB
8.0000 mg | Freq: Four times a day (QID) | INTRAVENOUS | Status: DC
  Administered 2012-03-25 – 2012-03-27 (×7): 8 mg via INTRAVENOUS
  Filled 2012-03-25 (×10): qty 8

## 2012-03-25 MED ORDER — OXYCODONE-ACETAMINOPHEN 5-325 MG PO TABS
1.0000 | ORAL_TABLET | ORAL | Status: DC | PRN
  Administered 2012-03-25 – 2012-03-26 (×2): 2 via ORAL
  Filled 2012-03-25 (×2): qty 2

## 2012-03-25 NOTE — Progress Notes (Signed)
Patient ID: [name], female   DOB: September 28, 1990, 21 y.o.   MRN: [id number] Hospital Day: 2  S: Preterm labor symptoms: low back pain, pelvic pressure and UC's  O: Blood pressure 103/55, pulse 99, temperature 98.2 F (36.8 C), temperature source Oral, resp. rate 20, height 5\' 2"  (1.575 m), weight 142 lb (64.411 kg), last menstrual period 08/19/2011, SpO2 100.00%.   FAO:ZHYQMVHQ: 140-150 bpm Toco: Irregular UC's ION:GEXBMWUX: Fingertip Effacement (%): 60 Cervical Position: Middle Station: -2 Exam by:: RFialkiewicz,RNC  A/P- 21 y.o. admitted with:  UC's  Patient Active Hospital Problem List: No active hospital problems.   Pregnancy Complications: none  Preterm labor management: IV D5LR started, bedrest advised, pelvic rest advised and magnesium sulfate. Dating:  [redacted]w[redacted]d PNL Needed:  none FWB:  good PTL:  stable ROD: spontaneous vaginal

## 2012-03-25 NOTE — H&P (Signed)
[name] is a 21 y.o. female presenting for UC's. Maternal Medical History:  Reason for admission: Reason for admission: contractions.  21 yo G1  EDC 05-25-12.  Presents with UC's.  Fetal activity: Perceived fetal activity is normal.   Last perceived fetal movement was within the past hour.    Prenatal complications: no prenatal complications Prenatal Complications - Diabetes: none.    OB History    Grav Para Term Preterm Abortions TAB SAB Ect Mult Living   1 0 0 0 0 0 0 0 0 0      Past Medical History  Diagnosis Date  . Hypoglycemia   . Ovarian cyst 2011  . Constipation   . Sickle cell trait    Past Surgical History  Procedure Date  . Laparoscopic ovarian cystectomy 2011  . Eye surgery     2004   Family History: family history includes Cancer in her paternal grandmother.  There is no history of Anesthesia problems and Hearing loss. Social History:  reports that she has never smoked. She has never used smokeless tobacco. She reports that she does not drink alcohol or use illicit drugs.   Prenatal Transfer Tool  Maternal Diabetes: No Genetic Screening: Normal Maternal Ultrasounds/Referrals: Normal Fetal Ultrasounds or other Referrals:  None Maternal Substance Abuse:  No Significant Maternal Medications:  Meds include: Other: PNV Significant Maternal Lab Results:  Lab values include: Other: OB panel. Other Comments:  None  Review of Systems  All other systems reviewed and are negative.    Dilation: Fingertip Effacement (%): 60 Station: -2 Exam by:: RFialkiewicz,RNC Blood pressure 103/55, pulse 99, temperature 98.2 F (36.8 C), temperature source Oral, resp. rate 20, height 5\' 2"  (1.575 m), weight 142 lb (64.411 kg), last menstrual period 08/19/2011, SpO2 100.00%. Maternal Exam:  Uterine Assessment: Contraction strength is moderate.  Contraction frequency is regular.   Abdomen: Fetal presentation: vertex  Introitus: Normal vulva. Normal vagina.   Cervix: Cervix evaluated by digital exam.     Physical Exam  Nursing note and vitals reviewed. Constitutional: She is oriented to person, place, and time. She appears well-developed and well-nourished.  HENT:  Head: Normocephalic and atraumatic.  Eyes: Conjunctivae normal are normal. Pupils are equal, round, and reactive to light.  Neck: Normal range of motion. Neck supple.  Cardiovascular: Normal rate and regular rhythm.   Respiratory: Effort normal and breath sounds normal.  GI: Soft.  Genitourinary: Vagina normal and uterus normal.  Musculoskeletal: Normal range of motion.  Neurological: She is alert and oriented to person, place, and time.  Skin: Skin is warm and dry.  Psychiatric: She has a normal mood and affect. Her behavior is normal. Judgment and thought content normal.    Prenatal labs: ABO, Rh: --/--/O POS (05/25 8295) Antibody:   Rubella:   RPR:    HBsAg:    HIV:    GBS:     Assessment/Plan: 31 weeks.  PTL.  Admit.  Tocolysis.  Bedrest.   HARPER,CHARLES A 03/25/2012, 7:50 AM

## 2012-03-26 LAB — GC/CHLAMYDIA PROBE AMP: CT Probe RNA: NEGATIVE

## 2012-03-26 MED ORDER — MAGNESIUM SULFATE BOLUS VIA INFUSION
4.0000 g | Freq: Once | INTRAVENOUS | Status: AC
  Administered 2012-03-26: 4 g via INTRAVENOUS
  Filled 2012-03-26: qty 500

## 2012-03-26 MED ORDER — NIFEDIPINE ER 30 MG PO TB24
30.0000 mg | ORAL_TABLET | Freq: Two times a day (BID) | ORAL | Status: DC
  Administered 2012-03-26: 30 mg via ORAL
  Filled 2012-03-26 (×3): qty 1

## 2012-03-26 MED ORDER — PROMETHAZINE HCL 25 MG/ML IJ SOLN
25.0000 mg | Freq: Once | INTRAMUSCULAR | Status: AC
  Administered 2012-03-26: 25 mg via INTRAMUSCULAR
  Filled 2012-03-26: qty 1

## 2012-03-26 MED ORDER — LACTATED RINGERS IV SOLN
INTRAVENOUS | Status: DC
Start: 2012-03-26 — End: 2012-03-27
  Administered 2012-03-27: 02:00:00 via INTRAVENOUS

## 2012-03-26 MED ORDER — MAGNESIUM SULFATE 40 G IN LACTATED RINGERS - SIMPLE
2.0000 g/h | INTRAVENOUS | Status: DC
  Administered 2012-03-26: 2 g/h via INTRAVENOUS
  Filled 2012-03-26: qty 500

## 2012-03-26 MED ORDER — MORPHINE SULFATE 10 MG/ML IJ SOLN
10.0000 mg | Freq: Once | INTRAMUSCULAR | Status: AC
  Administered 2012-03-26: 10 mg via INTRAMUSCULAR
  Filled 2012-03-26: qty 1

## 2012-03-26 NOTE — Progress Notes (Signed)
Patient ID: Brain Hilts, female   DOB: [birth year], 21 y.o.   MRN: [id number] Hospital Day: 3  S: Preterm labor symptoms: UC's  O: Blood pressure 122/63, pulse 98, temperature 97.7 F (36.5 C), temperature source Oral, resp. rate 18, height 5\' 2"  (1.575 m), weight 142 lb (64.411 kg), last menstrual period 08/19/2011, SpO2 100.00%.   XBM:WUXLKGMW: 150's bpm Toco: Irregular UC's NUU:VOZDGUYQ: Fingertip Effacement (%): 60 Cervical Position: Middle Station: -2 Exam by:: RFialkiewicz,RNC  A/P- 21 y.o. admitted with:  UC's  Patient Active Hospital Problem List: No active hospital problems.   Pregnancy Complications: None  Preterm labor management: IV D5LR started and bedrest advised Dating:  [redacted]w[redacted]d PNL Needed:  none FWB:  good PTL:  stable

## 2012-03-26 NOTE — Progress Notes (Signed)
Called to patient's room, pt. Complaining of pain in lower back and pelvic area; rate pain 10/10. pt. With facial grimace and holding her breath. Dr. [name] notified, directed to perform SVE.  SVE  (60/fingertip/-2), no bloody show; no change from previous exam on 03/24/12. Orders received.

## 2012-03-27 ENCOUNTER — Inpatient Hospital Stay (HOSPITAL_COMMUNITY): Payer: Medicaid Other

## 2012-03-27 NOTE — Progress Notes (Signed)
Dr Tamela Oddi notified of uc pattern. Informed that pt does not feel comfortable going home at this time. States its ok for pt to be discharged home, she can go home and take a bath and see if that helps. And return if needed.and pain worse. Pt informed of above. Phone transferred into room for dr Tamela Oddi to talk with pt.

## 2012-03-27 NOTE — Progress Notes (Signed)
Dr. Tamela Oddi notified of uc pattern, pain. No new orders given, will continue to monitor.

## 2012-03-27 NOTE — Discharge Summary (Signed)
  Physician Discharge Summary  Patient ID: IRAM HEIMANN MRN: [id number] DOB/AGE: 20-22-1992 20 y.o.  Admit date: 03/24/2012 Discharge date: 03/27/2012  Admission Diagnoses: Threatened PTL Discharge Diagnoses:  Active Problems:  Preterm labor   Discharged Condition: stable  Hospital Course: The patient received MgSO4 for tocolysis as well as broad spectrum antibiotics.  There was no progressive cervical change.  The tocodynamometer showed uterine irritability at the time of discharge.  An U/S showed appropriate growth and a cervical length of >3 cm.  Consults: None  Significant Diagnostic Studies: see above  Treatments: see above  Discharge Exam: Blood pressure 121/64, pulse 91, temperature 98.2 F (36.8 C), temperature source Oral, resp. rate 18, height 5\' 2"  (1.575 m), weight 64.411 kg (142 lb), last menstrual period 08/19/2011, SpO2 100.00%. General appearance: alert GI: soft, non-tender; bowel sounds normal; no masses,  no organomegaly Extremities: extremities normal, atraumatic, no cyanosis or edema  Disposition: 01-Home or Self Care  Discharge Orders    Future Orders Please Complete By Expires   PRETERM LABOR:  Includes any of the follwing symptoms that occur between 20 - [redacted] weeks gestation.  If these symptoms are not stopped, preterm labor can result in preterm delivery, placing your baby at risk      Notify physician for menstrual like cramps      Notify physician for uterine contractions.  These may be painless and feel like the uterus is tightening or the baby is  "balling up"      Notify physician for low, dull backache, unrelieved by heat or Tylenol      Notify physician for intestinal cramps, with or without diarrhea, sometimes described as "gas pain"      Notify physician for pelvic pressure      Notify physician for increase or change in vaginal discharge      Notify physician for vaginal bleeding      Notify physician for a general feeling that  "something is not right"      Notify physician for leaking of fluid      Discharge activity: Bedrest      Do not have sex or do anything that might make you have an orgasm          Medication List    Notice       You have not been prescribed any medications.            Follow-up Information    Follow up with HARPER,CHARLES A, MD. In 1 week.   Contact information:   [address] [place] [address] [place] [address] [phone]          Signed: Roseanna Rainbow 03/27/2012, 4:55 PM

## 2012-03-27 NOTE — Progress Notes (Signed)
Patient ID: Brain Hilts, female   DOB: 1991-03-10, 21 y.o.   MRN: [id number] Hospital Day: 4  S: Preterm labor symptoms: none at present  O: Blood pressure 101/56, pulse 88, temperature 98.6 F (37 C), temperature source Oral, resp. rate 18, height 5\' 2"  (1.575 m), weight 64.411 kg (142 lb), last menstrual period 08/19/2011, SpO2 100.00%.   NWG:NFAOZHYQ: 140 bpm, Variability: Good {> 6 bpm) and Decelerations: Absent Toco: None MVH:QIONGEXB: Fingertip Effacement (%): 60 Cervical Position: Middle Station: -2 Exam by:: RFialkiewicz,RNC  A/P- 21 y.o. admitted with:  Patient Active Hospital Problem List: Preterm labor (03/27/2012)   Assessment: Stable, no UCs   Plan: D/C MgSO4, U/S for growth; likely d/c home later today    Dating:  [redacted]w[redacted]d  FWB:  FHT reassruing

## 2012-03-27 NOTE — Progress Notes (Signed)
Informed pt of md orders for discharge. States is contracting. Monitors adjusted. Will observe.

## 2012-03-27 NOTE — MAU Note (Signed)
Called to see pt and mother who are very angry at MD's plan of care which was for DC to home tonight. Pt states she only sees Dr. [name], and that is well-known in the practice. Pt also states that she is ready to have the baby and that she would refuse Magnesium IV if ordered. Long discussion ensued about the risks of preterm delivery, and that maternal discomfort may be preferable to preterm birth and extended hsopitalization for the baby. Pt and mother both considering leaving and going to a different hospital. I asked them to consider staying tonight until Dr. [name] returns on call at 0700, and to discuss his plan of care as well as his office practices since she no longer wants to see Dr. Tamela Oddi. Pt and mother agreeable. At this time, we have orders from Dr. Tamela Oddi to overnight observation this patient. I told her that her nursing care would be vigilant, and if any changes occur we will take care of her. Pt asks for her cervix to be checked because ucs are "stronger than ever". Plan to recheck cervix.

## 2012-03-28 MED ORDER — NIFEDIPINE ER 30 MG PO TB24
30.0000 mg | ORAL_TABLET | Freq: Two times a day (BID) | ORAL | Status: DC
  Administered 2012-03-28 (×2): 30 mg via ORAL
  Filled 2012-03-28 (×3): qty 1

## 2012-03-29 MED ORDER — OXYCODONE-ACETAMINOPHEN 5-325 MG PO TABS: 1.0000 | ORAL_TABLET | ORAL | Status: DC | PRN

## 2012-03-29 MED ORDER — NIFEDIPINE ER 30 MG PO TB24: 30.0000 mg | ORAL_TABLET | Freq: Two times a day (BID) | ORAL | Status: DC

## 2012-03-29 MED ORDER — ZOLPIDEM TARTRATE 5 MG PO TABS: 5.0000 mg | ORAL_TABLET | Freq: Every evening | ORAL | Status: DC | PRN

## 2012-03-29 NOTE — Discharge Summary (Signed)
Physician Discharge Summary  Patient ID: [name] MRN: [id number] DOB/AGE: Nov 04, 1990 21 y.o.  Admit date: 03/24/2012 Discharge date: 03/29/2012  Admission Diagnoses: PTL  Discharge Diagnoses: Same Active Problems:  Preterm labor   Discharged Condition: good  Hospital Course: Admitted with painful UC's.  Responded well to tocolysis.  Discharged home on Procardia p.o.  Consults: None  Significant Diagnostic Studies: labs: CBC, CMET  Treatments: IV hydration and antibiotics: azithromyc in and Ampicillin  Discharge Exam: Blood pressure 127/69, pulse 102, temperature 98.5 F (36.9 C), temperature source Oral, resp. rate 18, height 5\' 2"  (1.575 m), weight 142 lb (64.411 kg), last menstrual period 08/19/2011, SpO2 100.00%. Cervix:  FT/Long/High  Disposition: 01-Home or Self Care  Discharge Orders    Future Orders Please Complete By Expires   PRETERM LABOR:  Includes any of the follwing symptoms that occur between 20 - [redacted] weeks gestation.  If these symptoms are not stopped, preterm labor can result in preterm delivery, placing your baby at risk      Notify physician for menstrual like cramps      Notify physician for uterine contractions.  These may be painless and feel like the uterus is tightening or the baby is  "balling up"      Notify physician for low, dull backache, unrelieved by heat or Tylenol      Notify physician for intestinal cramps, with or without diarrhea, sometimes described as "gas pain"      Notify physician for pelvic pressure      Notify physician for increase or change in vaginal discharge      Notify physician for vaginal bleeding      Notify physician for a general feeling that "something is not right"      Notify physician for leaking of fluid      Discharge activity: Bedrest      Do not have sex or do anything that might make you have an orgasm      Discharge instructions      Comments:   No strenuous activity.  Rest.   PRETERM LABOR:   Includes any of the follwing symptoms that occur between 20 - [redacted] weeks gestation.  If these symptoms are not stopped, preterm labor can result in preterm delivery, placing your baby at risk      Notify physician for menstrual like cramps      Notify physician for uterine contractions.  These may be painless and feel like the uterus is tightening or the baby is  "balling up"      Notify physician for low, dull backache, unrelieved by heat or Tylenol      Notify physician for intestinal cramps, with or without diarrhea, sometimes described as "gas pain"      Notify physician for pelvic pressure      Notify physician for increase or change in vaginal discharge      Notify physician for vaginal bleeding      Notify physician for a general feeling that "something is not right"      Notify physician for leaking of fluid      Discharge activity:      Discharge diet:  No restrictions      Do not have sex or do anything that might make you have an orgasm      Discharge patient          Medication List     As of 03/29/2012  5:54 AM    TAKE these medications  NIFEdipine 30 MG 24 hr tablet   Commonly known as: PROCARDIA-XL/ADALAT CC   Take 1 tablet (30 mg total) by mouth 2 (two) times daily.      oxyCODONE-acetaminophen 5-325 MG per tablet   Commonly known as: PERCOCET/ROXICET   Take 1-2 tablets by mouth every 4 (four) hours as needed.      zolpidem 5 MG tablet   Commonly known as: AMBIEN   Take 1 tablet (5 mg total) by mouth at bedtime as needed for sleep.           Follow-up Information    Follow up with HARPER,CHARLES A, MD. In 1 week.   Contact information:   [address] [place] [address] [phone]          Signed: HARPER,CHARLES A 03/29/2012, 5:54 AM

## 2012-03-29 NOTE — Progress Notes (Signed)
Patient ID: [name], female   DOB: 1990/11/04, 21 y.o.   MRN: [id number] Hospital Day: 6  S: Preterm labor symptoms:  UC's O: Blood pressure 127/69, pulse 102, temperature 98.5 F (36.9 C), temperature source Oral, resp. rate 18, height 5\' 2"  (1.575 m), weight 142 lb (64.411 kg), last menstrual period 08/19/2011, SpO2 100.00%.   JYN:WGNFAOZH: 150 bpm Toco: Irregular UC's. YQM:VHQIONGE: Fingertip Effacement (%): 30 Cervical Position: Anterior Station: -3 Exam by:: V.Ekundayo,RN  A/P- 21 y.o. admitted with:  Patient Active Hospital Problem List: Preterm labor (03/27/2012)   Assessment: PTL.  Sable.   Plan: Continue.  Discharge home.   Pregnancy Complications: preterm labor   Preterm labor management: pelvic rest advised, modified bedrest advised, preterm labor education provided and return to office weekly for follow-up Dating:  [redacted]w[redacted]d PNL Needed:  none FWB:  good PTL:  stable ROD: spontaneous vaginal

## 2012-03-31 ENCOUNTER — Encounter (HOSPITAL_COMMUNITY): Payer: Self-pay | Admitting: *Deleted

## 2012-03-31 ENCOUNTER — Inpatient Hospital Stay (HOSPITAL_COMMUNITY)
Admission: AD | Admit: 2012-03-31 | Discharge: 2012-03-31 | Disposition: A | Discharge status: Home / Self Care | Payer: Medicaid Other | Source: Ambulatory Visit | Attending: Obstetrics & Gynecology | Admitting: Obstetrics & Gynecology

## 2012-03-31 DIAGNOSIS — O4700 False labor before 37 completed weeks of gestation, unspecified trimester: Secondary | ICD-10-CM | POA: Insufficient documentation

## 2012-03-31 DIAGNOSIS — O479 False labor, unspecified: Secondary | ICD-10-CM

## 2012-03-31 NOTE — MAU Provider Note (Signed)
  History     CSN: [id number]  Arrival date and time: 03/31/12 1633   First Provider Initiated Contact with Patient 03/31/12 1735      Chief Complaint  Patient presents with  . Labor Eval   HPI  Pt is a G1P0 at 32.1 wks IUP discharged today from antenatal for tocolysis and betamethasone administration.  Here with report of contractions that increased in intensity around 1500.  No report of bleeding, possible clear fluid this AM.  +fetal movement.  Reports not taking Procardia today, "ready to have baby".   Past Medical History  Diagnosis Date  . Hypoglycemia   . Ovarian cyst 2011  . Constipation   . Sickle cell trait     Past Surgical History  Procedure Date  . Laparoscopic ovarian cystectomy 2011  . Eye surgery     2004    Family History  Problem Relation Age of Onset  . Anesthesia problems Neg Hx   . Hearing loss Neg Hx   . Cancer Paternal Grandmother     breast    History  Substance Use Topics  . Smoking status: Never Smoker   . Smokeless tobacco: Never Used  . Alcohol Use: No    Allergies: No Known Allergies  Prescriptions prior to admission  Medication Sig Dispense Refill  . NIFEdipine (PROCARDIA-XL/ADALAT CC) 30 MG 24 hr tablet Take 1 tablet (30 mg total) by mouth 2 (two) times daily.  60 tablet  0  . oxyCODONE-acetaminophen (PERCOCET/ROXICET) 5-325 MG per tablet Take 1-2 tablets by mouth every 4 (four) hours as needed.  40 tablet  0  . zolpidem (AMBIEN) 5 MG tablet Take 1 tablet (5 mg total) by mouth at bedtime as needed for sleep.  14 tablet  0    Review of Systems  Gastrointestinal: Positive for abdominal pain (contractions).  Genitourinary:       Leaking of fluid  All other systems reviewed and are negative.   Physical Exam   Blood pressure 135/84, pulse 114, temperature 98.3 F (36.8 C), temperature source Oral, resp. rate 20, last menstrual period 08/19/2011, SpO2 99.00%. Filed Vitals:   03/31/12 1708 03/31/12 1841  BP: 135/84 115/70   Pulse: 114 98  Temp: 98.3 F (36.8 C) 97.5 F (36.4 C)  TempSrc: Oral Oral  Resp: 20 18  SpO2: 99%    Physical Exam  Constitutional: She is oriented to person, place, and time. She appears well-developed and well-nourished. No distress.  HENT:  Head: Normocephalic.  Neck: Normal range of motion. Neck supple.  Cardiovascular: Normal rate, regular rhythm and normal heart sounds.   Respiratory: Effort normal and breath sounds normal.  GI: Soft. There is no tenderness.  Genitourinary: No bleeding around the vagina. Vaginal discharge (mucusy, negative pooling, negative ferns) found.  Neurological: She is alert and oriented to person, place, and time.  Skin: Skin is warm and dry.   Dilation: Fingertip Effacement (%): 10 Cervical Position: Middle Exam by:: W. Jerolyn Center, CNM  FHR 130's, +accels, reactive Toco - irregular  MAU Course  Procedures No results found for this or any previous visit (from the past 24 hour(s)).  Consult with Dr. Tamela Oddi who discussed with pt the complications with delivering a baby at 32 wks; pt agrees to discharge with preterm labor precautions.  Assessment and Plan  False Labor Category I Tracing  Plan: DC to home Continue medications. Keep scheduled appointment  Preterm Labor precautions  [facility] 03/31/2012, 5:35 PM

## 2012-03-31 NOTE — MAU Note (Signed)
Patient states she is having contractions every 2 minutes. Denies any bleeding or leaking and reports good fetal movement.

## 2012-04-10 ENCOUNTER — Encounter (HOSPITAL_COMMUNITY): Payer: Self-pay | Admitting: *Deleted

## 2012-04-10 ENCOUNTER — Inpatient Hospital Stay (HOSPITAL_COMMUNITY)
Admission: AD | Admit: 2012-04-10 | Discharge: 2012-04-10 | Disposition: A | Discharge status: Home / Self Care | Payer: Medicaid Other | Source: Ambulatory Visit | Attending: Obstetrics & Gynecology | Admitting: Obstetrics & Gynecology

## 2012-04-10 DIAGNOSIS — O4700 False labor before 37 completed weeks of gestation, unspecified trimester: Secondary | ICD-10-CM | POA: Insufficient documentation

## 2012-04-10 DIAGNOSIS — N949 Unspecified condition associated with female genital organs and menstrual cycle: Secondary | ICD-10-CM | POA: Insufficient documentation

## 2012-04-10 DIAGNOSIS — R109 Unspecified abdominal pain: Secondary | ICD-10-CM | POA: Insufficient documentation

## 2012-04-10 DIAGNOSIS — N898 Other specified noninflammatory disorders of vagina: Secondary | ICD-10-CM

## 2012-04-10 DIAGNOSIS — O26899 Other specified pregnancy related conditions, unspecified trimester: Secondary | ICD-10-CM

## 2012-04-10 DIAGNOSIS — O99891 Other specified diseases and conditions complicating pregnancy: Secondary | ICD-10-CM | POA: Insufficient documentation

## 2012-04-10 LAB — URINALYSIS, ROUTINE W REFLEX MICROSCOPIC
Bilirubin Urine: NEGATIVE
Hgb urine dipstick: NEGATIVE
Ketones, ur: NEGATIVE mg/dL
Nitrite: NEGATIVE
Specific Gravity, Urine: 1.01 (ref 1.005–1.030)
Urobilinogen, UA: 0.2 mg/dL (ref 0.0–1.0)

## 2012-04-10 LAB — URINE MICROSCOPIC-ADD ON

## 2012-04-10 LAB — FETAL FIBRONECTIN: Fetal Fibronectin: NEGATIVE

## 2012-04-10 LAB — WET PREP, GENITAL
Clue Cells Wet Prep HPF POC: NONE SEEN
Trich, Wet Prep: NONE SEEN

## 2012-04-10 LAB — AMNISURE RUPTURE OF MEMBRANE (ROM) NOT AT ARMC: Amnisure ROM: NEGATIVE

## 2012-04-10 NOTE — MAU Provider Note (Signed)
History     CSN: [id number]  Arrival date and time: 04/10/12 1005   None     Chief Complaint  Patient presents with  . Rupture of Membranes  . Abdominal Pain   HPI 21 y.o. G1P0000 at [redacted]w[redacted]d with c/o leaking fluid and cramping. Woke at 0600, underwear "soaked" with brownish,watery fluid. Cramping started about 1 hour ago. Denies contractions or bleeding. + fetal movement.    Past Medical History  Diagnosis Date  . Hypoglycemia   . Ovarian cyst 2011  . Constipation   . Sickle cell trait     Past Surgical History  Procedure Date  . Laparoscopic ovarian cystectomy 2011  . Eye surgery     2004    Family History  Problem Relation Age of Onset  . Anesthesia problems Neg Hx   . Hearing loss Neg Hx   . Cancer Paternal Grandmother     breast    History  Substance Use Topics  . Smoking status: Never Smoker   . Smokeless tobacco: Never Used  . Alcohol Use: No    Allergies: No Known Allergies  Prescriptions prior to admission  Medication Sig Dispense Refill  . NIFEdipine (PROCARDIA-XL/ADALAT CC) 30 MG 24 hr tablet Take 1 tablet (30 mg total) by mouth 2 (two) times daily.  60 tablet  0  . oxyCODONE-acetaminophen (PERCOCET/ROXICET) 5-325 MG per tablet Take 1-2 tablets by mouth every 4 (four) hours as needed.  40 tablet  0  . zolpidem (AMBIEN) 5 MG tablet Take 1 tablet (5 mg total) by mouth at bedtime as needed for sleep.  14 tablet  0    Review of Systems  Constitutional: Negative.   Respiratory: Negative.   Cardiovascular: Negative.   Gastrointestinal: Negative for nausea, vomiting, abdominal pain, diarrhea and constipation.  Genitourinary: Negative for dysuria, urgency, frequency, hematuria and flank pain.       Positive for vaginal discharge and cramping, Negative for bleeding and contractions   Musculoskeletal: Negative.   Neurological: Negative.   Psychiatric/Behavioral: Negative.    Physical Exam   Blood pressure 118/76, pulse 92, temperature 98.2 F  (36.8 C), temperature source Oral, resp. rate 16, height 5\' 3"  (1.6 m), weight 148 lb 12.8 oz (67.495 kg), last menstrual period 08/19/2011.  Physical Exam  Nursing note and vitals reviewed. Constitutional: She is oriented to person, place, and time. She appears well-developed and well-nourished. No distress.  Cardiovascular: Normal rate.   Respiratory: Effort normal.  GI: Soft. There is no tenderness.  Genitourinary: Cervix exhibits discharge (clear mucous). Cervix exhibits no friability. No bleeding around the vagina. Vaginal discharge (small, white, malodorous) found.       Closed/thick/high  Musculoskeletal: Normal range of motion.  Neurological: She is alert and oriented to person, place, and time.  Skin: Skin is warm and dry.  Psychiatric: She has a normal mood and affect.   EFM reactive, TOCO irritability MAU Course  Procedures Results for orders placed during the hospital encounter of 04/10/12 (from the past 24 hour(s))  URINALYSIS, ROUTINE W REFLEX MICROSCOPIC     Status: Abnormal   Collection Time   04/10/12 10:16 AM      Component Value Range   Color, Urine YELLOW  YELLOW   APPearance HAZY (*) CLEAR   Specific Gravity, Urine 1.010  1.005 - 1.030   pH 6.0  5.0 - 8.0   Glucose, UA NEGATIVE  NEGATIVE mg/dL   Hgb urine dipstick NEGATIVE  NEGATIVE   Bilirubin Urine NEGATIVE  NEGATIVE  Ketones, ur NEGATIVE  NEGATIVE mg/dL   Protein, ur NEGATIVE  NEGATIVE mg/dL   Urobilinogen, UA 0.2  0.0 - 1.0 mg/dL   Nitrite NEGATIVE  NEGATIVE   Leukocytes, UA MODERATE (*) NEGATIVE  URINE MICROSCOPIC-ADD ON     Status: Abnormal   Collection Time   04/10/12 10:16 AM      Component Value Range   Squamous Epithelial / LPF FEW (*) RARE   WBC, UA 7-10  <3 WBC/hpf   Urine-Other MUCOUS PRESENT    FETAL FIBRONECTIN     Status: Normal   Collection Time   04/10/12 10:50 AM      Component Value Range   Fetal Fibronectin NEGATIVE  NEGATIVE  WET PREP, GENITAL     Status: Abnormal    Collection Time   04/10/12 10:53 AM      Component Value Range   Yeast Wet Prep HPF POC NONE SEEN  NONE SEEN   Trich, Wet Prep NONE SEEN  NONE SEEN   Clue Cells Wet Prep HPF POC NONE SEEN  NONE SEEN   WBC, Wet Prep HPF POC FEW (*) NONE SEEN  AMNISURE RUPTURE OF MEMBRANE (ROM)     Status: Normal   Collection Time   04/10/12 11:05 AM      Component Value Range   Amnisure ROM NEGATIVE       Assessment and Plan   1. Vaginal discharge in pregnancy   Rev'd precautions    Medication List     As of 04/10/2012 11:59 AM    CONTINUE taking these medications         NIFEdipine 30 MG 24 hr tablet   Commonly known as: PROCARDIA-XL/ADALAT CC   Take 1 tablet (30 mg total) by mouth 2 (two) times daily.      oxyCODONE-acetaminophen 5-325 MG per tablet   Commonly known as: PERCOCET/ROXICET   Take 1-2 tablets by mouth every 4 (four) hours as needed.      zolpidem 5 MG tablet   Commonly known as: AMBIEN   Take 1 tablet (5 mg total) by mouth at bedtime as needed for sleep.            Follow-up Information    Follow up with HARPER,CHARLES A, MD. (as scheduled)    Contact information:   [address] [place] [address] [phone]            [facility] 04/10/2012, 11:58 AM

## 2012-04-10 NOTE — MAU Note (Signed)
Pt woke up @ 0600 to use the BR, her underwear was soaked, brownish stain in underwear.  Lower abd cramping started 45 minutes ago.  Active FM.

## 2012-04-22 ENCOUNTER — Encounter (HOSPITAL_COMMUNITY): Payer: Self-pay

## 2012-04-22 ENCOUNTER — Inpatient Hospital Stay (HOSPITAL_COMMUNITY)
Admission: AD | Admit: 2012-04-22 | Discharge: 2012-04-22 | Disposition: A | Discharge status: Home / Self Care | Payer: Medicaid Other | Source: Ambulatory Visit | Attending: Obstetrics & Gynecology | Admitting: Obstetrics & Gynecology

## 2012-04-22 DIAGNOSIS — R109 Unspecified abdominal pain: Secondary | ICD-10-CM | POA: Insufficient documentation

## 2012-04-22 DIAGNOSIS — M545 Low back pain, unspecified: Secondary | ICD-10-CM | POA: Insufficient documentation

## 2012-04-22 DIAGNOSIS — M549 Dorsalgia, unspecified: Secondary | ICD-10-CM

## 2012-04-22 DIAGNOSIS — O99891 Other specified diseases and conditions complicating pregnancy: Secondary | ICD-10-CM | POA: Insufficient documentation

## 2012-04-22 LAB — URINALYSIS, ROUTINE W REFLEX MICROSCOPIC
Glucose, UA: NEGATIVE mg/dL
Hgb urine dipstick: NEGATIVE
Ketones, ur: NEGATIVE mg/dL
Protein, ur: NEGATIVE mg/dL

## 2012-04-22 LAB — URINE MICROSCOPIC-ADD ON

## 2012-04-22 MED ORDER — GI COCKTAIL ~~LOC~~: 30.0000 mL | Freq: Once | ORAL | Status: DC

## 2012-04-22 MED ORDER — CYCLOBENZAPRINE HCL 10 MG PO TABS
10.0000 mg | ORAL_TABLET | Freq: Once | ORAL | Status: AC
  Administered 2012-04-22: 10 mg via ORAL
  Filled 2012-04-22: qty 1

## 2012-04-22 NOTE — MAU Note (Signed)
Pt c/o back pain and abd pain that comes and goes. Pain started this morning. Pain is getting worse.

## 2012-04-22 NOTE — MAU Provider Note (Signed)
  History     CSN: [id number]  Arrival date and time: 04/22/12 1803   First Provider Initiated Contact with Patient 04/22/12 1841      Chief Complaint  Patient presents with  . Back Pain   HPI  [name] is a 21 y.o. G1P0000 at [redacted]w[redacted]d who presents with 10/10 lower back pain and abdominal pain. She states that it started around 1600 today. She was last seen in the office yesterday and denies any complications with the pregnancy. Denies any LOF or VB, baby has been moving normally.   Past Medical History  Diagnosis Date  . Hypoglycemia   . Ovarian cyst 2011  . Constipation   . Sickle cell trait     Past Surgical History  Procedure Date  . Laparoscopic ovarian cystectomy 2011  . Eye surgery     2004    Family History  Problem Relation Age of Onset  . Anesthesia problems Neg Hx   . Hearing loss Neg Hx   . Cancer Paternal Grandmother     breast    History  Substance Use Topics  . Smoking status: Never Smoker   . Smokeless tobacco: Never Used  . Alcohol Use: No    Allergies: No Known Allergies  Prescriptions prior to admission  Medication Sig Dispense Refill  . NIFEdipine (PROCARDIA-XL/ADALAT CC) 30 MG 24 hr tablet Take 1 tablet (30 mg total) by mouth 2 (two) times daily.  60 tablet  0  . oxyCODONE-acetaminophen (PERCOCET/ROXICET) 5-325 MG per tablet Take 1-2 tablets by mouth every 4 (four) hours as needed.  40 tablet  0  . zolpidem (AMBIEN) 5 MG tablet Take 1 tablet (5 mg total) by mouth at bedtime as needed for sleep.  14 tablet  0    Review of Systems  Constitutional: Negative for fever and chills.  Eyes: Negative for blurred vision.  Gastrointestinal: Positive for nausea, vomiting (last vomited today around 1300) and abdominal pain. Negative for diarrhea and constipation.  Genitourinary: Positive for dysuria. Negative for urgency and frequency.  Musculoskeletal: Negative for myalgias.  Neurological: Negative for dizziness and headaches.   Physical  Exam   Blood pressure 108/71, pulse 117, temperature 98.7 F (37.1 C), temperature source Oral, resp. rate 18, height 5\' 3"  (1.6 m), weight 68.04 kg (150 lb), last menstrual period 08/19/2011.  Physical Exam  Nursing note and vitals reviewed. Constitutional: She is oriented to person, place, and time. She appears well-developed and well-nourished. No distress.  Cardiovascular: Normal rate.   Respiratory: Effort normal.  GI: Soft.  Genitourinary:       No CVA tenderness FHT: 135, moderate with 15x15 accels, no decels Toco: some UI and random UCs Cervix: FT/Thick/-3  Neurological: She is alert and oriented to person, place, and time.  Skin: Skin is warm and dry.  Psychiatric: She has a normal mood and affect.    MAU Course  Procedures  2006: Spoke with Dr. Tamela Oddi. Pt is Ok for [employer] home.   Assessment and Plan   1. Back pain in pregnancy   Back excercises and comfort measures reviewed FU with [facility] as scheduled 3rd trimester danger signs reviewed   Tawnya Crook 04/22/2012, 6:43 PM

## 2012-04-26 NOTE — L&D Delivery Note (Signed)
Delivery Note At 5:30 PM a viable female was delivered via Vaginal, Spontaneous Delivery (Presentation: Right Occiput Anterior).  APGAR: 1 -  4; weight 7 lb 8 oz (3402 g).   Placenta status: Intact, Spontaneous.  Cord: 3 vessels with the following complications: None.  Cord pH: 7.26  Anesthesia: Epidural  Episiotomy: Median Lacerations: 2nd degree Suture Repair: 2.0 chromic vicryl Est. Blood Loss (mL): 400  Mom to postpartum.  Baby to nursery-stable.  HARPER,CHARLES A 05/28/2012, 8:07 PM

## 2012-05-03 ENCOUNTER — Encounter (HOSPITAL_COMMUNITY): Payer: Self-pay | Admitting: *Deleted

## 2012-05-03 ENCOUNTER — Inpatient Hospital Stay (HOSPITAL_COMMUNITY)
Admission: AD | Admit: 2012-05-03 | Discharge: 2012-05-04 | Disposition: A | Discharge status: Home / Self Care | Payer: Medicaid Other | Source: Ambulatory Visit | Attending: Obstetrics & Gynecology | Admitting: Obstetrics & Gynecology

## 2012-05-03 DIAGNOSIS — R197 Diarrhea, unspecified: Secondary | ICD-10-CM

## 2012-05-03 DIAGNOSIS — O212 Late vomiting of pregnancy: Secondary | ICD-10-CM | POA: Insufficient documentation

## 2012-05-03 DIAGNOSIS — K529 Noninfective gastroenteritis and colitis, unspecified: Secondary | ICD-10-CM

## 2012-05-03 DIAGNOSIS — R112 Nausea with vomiting, unspecified: Secondary | ICD-10-CM

## 2012-05-03 DIAGNOSIS — O99891 Other specified diseases and conditions complicating pregnancy: Secondary | ICD-10-CM | POA: Insufficient documentation

## 2012-05-03 DIAGNOSIS — K5289 Other specified noninfective gastroenteritis and colitis: Secondary | ICD-10-CM | POA: Insufficient documentation

## 2012-05-03 LAB — URINE MICROSCOPIC-ADD ON

## 2012-05-03 LAB — URINALYSIS, ROUTINE W REFLEX MICROSCOPIC
Nitrite: NEGATIVE
Specific Gravity, Urine: <1.005 — ABNORMAL LOW (ref 1.005–1.030)
pH: 6 (ref 5.0–8.0)

## 2012-05-03 MED ORDER — PROMETHAZINE HCL 25 MG PO TABS: 12.5000 mg | ORAL_TABLET | Freq: Four times a day (QID) | ORAL | Status: DC | PRN

## 2012-05-03 NOTE — MAU Note (Signed)
Pt reports she can't keep anything down for last 2 days, 3 times today. Diarrhea x 2 days, x 2 days.

## 2012-05-03 NOTE — MAU Provider Note (Signed)
History     CSN: [id number]  Arrival date and time: 05/03/12 2145   None     Chief Complaint  Patient presents with  . Emesis  . Diarrhea   HPI [name] is a 22 y.o. female @ [redacted]w[redacted]d gestation who presents to MAU with nausea, vomiting and diarrhea. The vomiting started yesterday and vomited x 2. Today vomited x 3 and had diarrhea x 2. She describes the diarrhea as soft, loose stools. Last BM early today.  She describes the vomit as undigested food. Today ate cereal, chicken and spaghetti. Last vomited at 9:45 pm. The history was provided by the patient.   OB History    Grav Para Term Preterm Abortions TAB SAB Ect Mult Living   1 0 0 0 0 0 0 0 0 0       Past Medical History  Diagnosis Date  . Hypoglycemia   . Ovarian cyst 2011  . Constipation   . Sickle cell trait     Past Surgical History  Procedure Date  . Laparoscopic ovarian cystectomy 2011  . Eye surgery     2004    Family History  Problem Relation Age of Onset  . Anesthesia problems Neg Hx   . Hearing loss Neg Hx   . Cancer Paternal Grandmother     breast    History  Substance Use Topics  . Smoking status: Never Smoker   . Smokeless tobacco: Never Used  . Alcohol Use: No    Allergies: No Known Allergies  Prescriptions prior to admission  Medication Sig Dispense Refill  . oxyCODONE-acetaminophen (PERCOCET/ROXICET) 5-325 MG per tablet Take 1-2 tablets by mouth every 4 (four) hours as needed.  40 tablet  0  . zolpidem (AMBIEN) 5 MG tablet Take 1 tablet (5 mg total) by mouth at bedtime as needed for sleep.  14 tablet  0  . NIFEdipine (PROCARDIA-XL/ADALAT CC) 30 MG 24 hr tablet Take 1 tablet (30 mg total) by mouth 2 (two) times daily.  60 tablet  0    Review of Systems  Constitutional: Negative for fever and chills.  Eyes: Negative.   Respiratory: Positive for cough. Negative for wheezing.   Cardiovascular: Negative.   Gastrointestinal: Positive for nausea, vomiting, abdominal pain and  diarrhea.  Genitourinary: Positive for frequency. Negative for dysuria and urgency.  Skin: Negative for rash.  Neurological: Positive for headaches. Negative for dizziness and seizures.  Psychiatric/Behavioral: Negative for depression. The patient is not nervous/anxious.    Physical Exam   Blood pressure 126/70, pulse 93, temperature 99 F (37.2 C), temperature source Oral, resp. rate 16, height 5\' 3"  (1.6 m), weight 69.4 kg (153 lb), last menstrual period 08/19/2011.  Physical Exam  Nursing note and vitals reviewed. Constitutional: She is oriented to person, place, and time. She appears well-developed and well-nourished. No distress.  HENT:  Head: Normocephalic and atraumatic.  Eyes: EOM are normal.  Neck: Neck supple.  Cardiovascular: Normal rate.   Respiratory: Effort normal.  GI: Soft. There is no tenderness.  Genitourinary:       Dilation: Fingertip Effacement (%): Thick Station: -2 Exam by:: B.Bethea RN  Musculoskeletal: Normal range of motion.  Neurological: She is alert and oriented to person, place, and time.  Skin: Skin is warm and dry.  Psychiatric: She has a normal mood and affect. Her behavior is normal. Judgment and thought content normal.   Procedures Results for orders placed during the hospital encounter of 05/03/12 (from the past 24 hour(s))  URINALYSIS, ROUTINE W REFLEX MICROSCOPIC     Status: Abnormal   Collection Time   05/03/12 10:05 PM      Component Value Range   Color, Urine YELLOW  YELLOW   APPearance CLEAR  CLEAR   Specific Gravity, Urine <1.005 (*) 1.005 - 1.030   pH 6.0  5.0 - 8.0   Glucose, UA NEGATIVE  NEGATIVE mg/dL   Hgb urine dipstick NEGATIVE  NEGATIVE   Bilirubin Urine NEGATIVE  NEGATIVE   Ketones, ur NEGATIVE  NEGATIVE mg/dL   Protein, ur NEGATIVE  NEGATIVE mg/dL   Urobilinogen, UA 0.2  0.0 - 1.0 mg/dL   Nitrite NEGATIVE  NEGATIVE   Leukocytes, UA SMALL (*) NEGATIVE  URINE MICROSCOPIC-ADD ON     Status: Abnormal   Collection Time    05/03/12 10:05 PM      Component Value Range   Squamous Epithelial / LPF FEW (*) RARE   WBC, UA 3-6  <3 WBC/hpf   RBC / HPF 0-2  <3 RBC/hpf   Bacteria, UA FEW (*) RARE   EFM: Baseline 130, irritability, reactive tracing  Assessment: 22 y.o. female @ [redacted]w[redacted]d gestation with nausea, vomiting and diarrhea  Plan:  Discussed with Dr. Tamela Oddi   Rx Phenergan   Follow up in the office tomorrow   Urine sent for culture Discussed with the patient and all questioned fully answered. She will return if any problems arise.   Medication List     As of 05/07/2012 10:45 PM    START taking these medications         promethazine 25 MG tablet   Commonly known as: PHENERGAN   Take 0.5 tablets (12.5 mg total) by mouth every 6 (six) hours as needed for nausea.      CONTINUE taking these medications         NIFEdipine 30 MG 24 hr tablet   Commonly known as: PROCARDIA-XL/ADALAT CC   Take 1 tablet (30 mg total) by mouth 2 (two) times daily.      oxyCODONE-acetaminophen 5-325 MG per tablet   Commonly known as: PERCOCET/ROXICET   Take 1-2 tablets by mouth every 4 (four) hours as needed.      zolpidem 5 MG tablet   Commonly known as: AMBIEN   Take 1 tablet (5 mg total) by mouth at bedtime as needed for sleep.          Where to get your medications    These are the prescriptions that you need to pick up. We sent them to a specific pharmacy, so you will need to go there to get them.   [id number] - [place] ([place]), [place] - [address]    [address] [place] ([place]) [place] [address]    Phone: [phone]        promethazine 25 MG tablet             NEESE,HOPE, RN, FNP, [facility] 05/03/2012, 11:18 PM

## 2012-05-03 NOTE — Progress Notes (Signed)
Pt states she is just feeling pain from the baby;s movements

## 2012-05-03 NOTE — MAU Note (Signed)
Pt states she started vomiting yesterday morning 3 episodes of vomiting today and 2 yesterday. And then pt states she had diarrhea x 3 today.

## 2012-05-04 MED ORDER — PROMETHAZINE HCL 25 MG PO TABS
25.0000 mg | ORAL_TABLET | Freq: Once | ORAL | Status: AC
  Administered 2012-05-04: 25 mg via ORAL
  Filled 2012-05-04: qty 1

## 2012-05-06 ENCOUNTER — Inpatient Hospital Stay (HOSPITAL_COMMUNITY)
Admission: AD | Admit: 2012-05-06 | Discharge: 2012-05-07 | Disposition: A | Discharge status: Home / Self Care | Payer: Medicaid Other | Source: Ambulatory Visit | Attending: Obstetrics | Admitting: Obstetrics

## 2012-05-06 DIAGNOSIS — O479 False labor, unspecified: Secondary | ICD-10-CM | POA: Insufficient documentation

## 2012-05-06 LAB — URINE CULTURE

## 2012-05-06 NOTE — MAU Note (Signed)
Patient is in with c/o ctx every 5 mins for 2 hours. She denies vaginal bleeding or lof. She reports good fetal movement

## 2012-05-07 MED ORDER — OXYCODONE-ACETAMINOPHEN 5-325 MG PO TABS
2.0000 | ORAL_TABLET | Freq: Once | ORAL | Status: AC
  Administered 2012-05-07: 2 via ORAL
  Filled 2012-05-07: qty 2

## 2012-05-07 NOTE — Progress Notes (Signed)
Dr [name] notified of patient, contraction pattern, tracing and sve result. Order to administer 2 tablets of percocet 5/325mg  and discharge home.

## 2012-05-08 ENCOUNTER — Inpatient Hospital Stay (HOSPITAL_COMMUNITY)
Admission: AD | Admit: 2012-05-08 | Discharge: 2012-05-09 | Disposition: A | Discharge status: Home / Self Care | Payer: Medicaid Other | Source: Ambulatory Visit | Attending: Obstetrics | Admitting: Obstetrics

## 2012-05-08 ENCOUNTER — Encounter (HOSPITAL_COMMUNITY): Payer: Self-pay | Admitting: *Deleted

## 2012-05-08 DIAGNOSIS — O479 False labor, unspecified: Secondary | ICD-10-CM | POA: Insufficient documentation

## 2012-05-08 NOTE — MAU Note (Signed)
Pt G1 at 37.4wks having contractions since 2030.  Denies leaking or bleeding.

## 2012-05-09 ENCOUNTER — Inpatient Hospital Stay (HOSPITAL_COMMUNITY)
Admission: AD | Admit: 2012-05-09 | Discharge: 2012-05-09 | Disposition: A | Discharge status: Home / Self Care | Payer: Medicaid Other | Source: Ambulatory Visit | Attending: Obstetrics | Admitting: Obstetrics

## 2012-05-09 ENCOUNTER — Encounter (HOSPITAL_COMMUNITY): Payer: Self-pay | Admitting: *Deleted

## 2012-05-09 DIAGNOSIS — O479 False labor, unspecified: Secondary | ICD-10-CM | POA: Insufficient documentation

## 2012-05-09 MED ORDER — OXYCODONE-ACETAMINOPHEN 5-325 MG PO TABS
2.0000 | ORAL_TABLET | Freq: Once | ORAL | Status: AC
  Administered 2012-05-09: 2 via ORAL
  Filled 2012-05-09: qty 2

## 2012-05-09 NOTE — MAU Note (Signed)
Dr. Marshall notified of pt, orders rec'd. 

## 2012-05-09 NOTE — MAU Note (Signed)
SAYS SHE WAS HERE ON Monday - 1 CM- TOOK  SHE TOOK AMBIEN AND PERCOCET AT 0030-  NO SLEEP.    HURT BAD  AT   7PM.   DENIES HSV AND MRSA.

## 2012-05-18 ENCOUNTER — Inpatient Hospital Stay (HOSPITAL_COMMUNITY)
Admission: AD | Admit: 2012-05-18 | Discharge: 2012-05-18 | Disposition: A | Discharge status: Home / Self Care | Payer: Medicaid Other | Source: Ambulatory Visit | Attending: Obstetrics | Admitting: Obstetrics

## 2012-05-18 ENCOUNTER — Encounter (HOSPITAL_COMMUNITY): Payer: Self-pay | Admitting: *Deleted

## 2012-05-18 DIAGNOSIS — O479 False labor, unspecified: Secondary | ICD-10-CM | POA: Insufficient documentation

## 2012-05-18 LAB — POCT FERN TEST: POCT Fern Test: NEGATIVE

## 2012-05-18 NOTE — MAU Note (Signed)
Patient states she has been having a lot of lower abdominal pressure for several days, worse today. States she lost her mucus plug on 1-18 and continues to have a discharge but not enough to wear a pad. Denies bleeding. Reports good fetal movement. Patient just left the gym after a workout.

## 2012-05-18 NOTE — MAU Provider Note (Signed)
Chief Complaint:  Labor Eval   First Provider Initiated Contact with Patient 05/18/12 1611      HPI: [name] is a 22 y.o. G1P0000 at [redacted]w[redacted]d who presents to maternity admissions for labor check. RN asked me to R/O SROM. No gush, just wetness. Denies contractions, leakage of fluid or vaginal bleeding. Good fetal movement.   Pregnancy Course: uncomplicated  Past Medical History: Past Medical History  Diagnosis Date  . Hypoglycemia   . Ovarian cyst 2011  . Constipation   . Sickle cell trait     Past obstetric history: OB History    Grav Para Term Preterm Abortions TAB SAB Ect Mult Living   1 0 0 0 0 0 0 0 0 0      # Outc Date GA Lbr Len/2nd Wgt Sex Del Anes PTL Lv   1 CUR               Past Surgical History: Past Surgical History  Procedure Date  . Laparoscopic ovarian cystectomy 2011  . Eye surgery     2004    Family History: Family History  Problem Relation Age of Onset  . Anesthesia problems Neg Hx   . Hearing loss Neg Hx   . Cancer Paternal Grandmother     breast    Social History: History  Substance Use Topics  . Smoking status: Never Smoker   . Smokeless tobacco: Never Used  . Alcohol Use: No    Allergies: No Known Allergies  Meds:  No prescriptions prior to admission    ROS: Pertinent findings in history of present illness.  Physical Exam  Blood pressure 125/79, pulse 111, temperature 98.2 F (36.8 C), temperature source Oral, resp. rate 20, height 5\' 4"  (1.626 m), weight 155 lb 12.8 oz (70.67 kg), last menstrual period 08/19/2011, SpO2 100.00%. GENERAL: Well-developed, well-nourished female in no acute distress.  SPECULUM EXAM: NEFG, physiologic discharge, no blood, cervix clean  Neg pool Neg fern Dilation: 1 Effacement (%): Thick Station: -3 Exam by:: D. Carroll Ranney  FHT:  Baseline 140 , moderate variability, accelerations present, no decelerations Contractions:rare, mild   Labs: Fern neg  Imaging:  No results found. MAU  Course:   Assessment: G1 term, false labor  Plan: Discharge home Labor precautions and fetal kick counts    Medication List     As of 05/18/2012  4:13 PM      [name], CNM 05/18/2012 4:13 PM

## 2012-05-25 ENCOUNTER — Telehealth (HOSPITAL_COMMUNITY): Payer: Self-pay | Admitting: *Deleted

## 2012-05-25 NOTE — Telephone Encounter (Signed)
Preadmission screen  

## 2012-05-26 ENCOUNTER — Inpatient Hospital Stay (HOSPITAL_COMMUNITY)
Admission: AD | Admit: 2012-05-26 | Discharge: 2012-05-26 | Disposition: A | Discharge status: Home / Self Care | Payer: Medicaid Other | Source: Ambulatory Visit | Attending: Obstetrics | Admitting: Obstetrics

## 2012-05-26 ENCOUNTER — Encounter (HOSPITAL_COMMUNITY): Payer: Self-pay | Admitting: *Deleted

## 2012-05-26 DIAGNOSIS — O99891 Other specified diseases and conditions complicating pregnancy: Secondary | ICD-10-CM | POA: Insufficient documentation

## 2012-05-26 DIAGNOSIS — R109 Unspecified abdominal pain: Secondary | ICD-10-CM | POA: Insufficient documentation

## 2012-05-26 NOTE — MAU Note (Signed)
Patient states she has been having sharp lower abdominal pain since about 1200. Denies bleeding or leaking and reports good fetal movement.

## 2012-05-27 ENCOUNTER — Encounter (HOSPITAL_COMMUNITY): Payer: Self-pay | Admitting: Anesthesiology

## 2012-05-27 ENCOUNTER — Inpatient Hospital Stay (HOSPITAL_COMMUNITY)
Admission: AD | Admit: 2012-05-27 | Discharge: 2012-06-03 | DRG: 774 | Disposition: A | Discharge status: Home / Self Care | Payer: Medicaid Other | Source: Ambulatory Visit | Attending: Obstetrics | Admitting: Obstetrics

## 2012-05-27 ENCOUNTER — Encounter (HOSPITAL_COMMUNITY): Payer: Self-pay | Admitting: *Deleted

## 2012-05-27 ENCOUNTER — Inpatient Hospital Stay (HOSPITAL_COMMUNITY): Payer: Medicaid Other | Admitting: Anesthesiology

## 2012-05-27 DIAGNOSIS — O9902 Anemia complicating childbirth: Secondary | ICD-10-CM | POA: Diagnosis not present

## 2012-05-27 DIAGNOSIS — O8612 Endometritis following delivery: Secondary | ICD-10-CM | POA: Diagnosis not present

## 2012-05-27 DIAGNOSIS — D573 Sickle-cell trait: Secondary | ICD-10-CM | POA: Diagnosis present

## 2012-05-27 LAB — CBC
MCH: 25.6 pg — ABNORMAL LOW (ref 26.0–34.0)
MCHC: 33.8 g/dL (ref 30.0–36.0)
MCV: 75.6 fL — ABNORMAL LOW (ref 78.0–100.0)
Platelets: 194 10*3/uL (ref 150–400)

## 2012-05-27 LAB — RPR: RPR Ser Ql: NONREACTIVE

## 2012-05-27 MED ORDER — LACTATED RINGERS IV SOLN
500.0000 mL | INTRAVENOUS | Status: DC | PRN
  Administered 2012-05-27: 500 mL via INTRAVENOUS

## 2012-05-27 MED ORDER — OXYTOCIN 40 UNITS IN LACTATED RINGERS INFUSION - SIMPLE MED
1.0000 m[IU]/min | INTRAVENOUS | Status: DC
  Administered 2012-05-27: 1 m[IU]/min via INTRAVENOUS

## 2012-05-27 MED ORDER — EPHEDRINE 5 MG/ML INJ: 10.0000 mg | INTRAVENOUS | Status: DC | PRN

## 2012-05-27 MED ORDER — PROMETHAZINE HCL 25 MG/ML IJ SOLN
25.0000 mg | Freq: Four times a day (QID) | INTRAMUSCULAR | Status: DC | PRN
  Administered 2012-05-27 (×2): 25 mg via INTRAMUSCULAR
  Filled 2012-05-27 (×2): qty 1

## 2012-05-27 MED ORDER — PHENYLEPHRINE 40 MCG/ML (10ML) SYRINGE FOR IV PUSH (FOR BLOOD PRESSURE SUPPORT): 80.0000 ug | PREFILLED_SYRINGE | INTRAVENOUS | Status: DC | PRN

## 2012-05-27 MED ORDER — DEXTROSE 5 % IV SOLN
2.0000 g | Freq: Three times a day (TID) | INTRAVENOUS | Status: DC
  Administered 2012-05-27 – 2012-05-28 (×3): 2 g via INTRAVENOUS
  Filled 2012-05-27 (×4): qty 2

## 2012-05-27 MED ORDER — LIDOCAINE HCL (PF) 1 % IJ SOLN
30.0000 mL | INTRAMUSCULAR | Status: DC | PRN
  Filled 2012-05-27: qty 30

## 2012-05-27 MED ORDER — LIDOCAINE HCL (PF) 1 % IJ SOLN
INTRAMUSCULAR | Status: DC | PRN
  Administered 2012-05-27 (×2): 5 mL

## 2012-05-27 MED ORDER — OXYTOCIN 40 UNITS IN LACTATED RINGERS INFUSION - SIMPLE MED
62.5000 mL/h | INTRAVENOUS | Status: DC
  Administered 2012-05-28: 62.5 mL/h via INTRAVENOUS
  Filled 2012-05-27: qty 1000

## 2012-05-27 MED ORDER — IBUPROFEN 600 MG PO TABS: 600.0000 mg | ORAL_TABLET | Freq: Four times a day (QID) | ORAL | Status: DC | PRN

## 2012-05-27 MED ORDER — OXYTOCIN BOLUS FROM INFUSION: 500.0000 mL | INTRAVENOUS | Status: DC

## 2012-05-27 MED ORDER — ACETAMINOPHEN 325 MG PO TABS
650.0000 mg | ORAL_TABLET | ORAL | Status: DC | PRN
  Administered 2012-05-27 – 2012-05-28 (×2): 650 mg via ORAL
  Filled 2012-05-27 (×2): qty 2

## 2012-05-27 MED ORDER — PHENYLEPHRINE 40 MCG/ML (10ML) SYRINGE FOR IV PUSH (FOR BLOOD PRESSURE SUPPORT)
80.0000 ug | PREFILLED_SYRINGE | INTRAVENOUS | Status: DC | PRN
  Filled 2012-05-27: qty 5

## 2012-05-27 MED ORDER — ONDANSETRON HCL 4 MG/2ML IJ SOLN
4.0000 mg | Freq: Four times a day (QID) | INTRAMUSCULAR | Status: DC | PRN
  Administered 2012-05-27 – 2012-05-28 (×2): 4 mg via INTRAVENOUS
  Filled 2012-05-27 (×2): qty 2

## 2012-05-27 MED ORDER — FLEET ENEMA 7-19 GM/118ML RE ENEM: 1.0000 | ENEMA | RECTAL | Status: DC | PRN

## 2012-05-27 MED ORDER — EPHEDRINE 5 MG/ML INJ
10.0000 mg | INTRAVENOUS | Status: DC | PRN
  Filled 2012-05-27: qty 4

## 2012-05-27 MED ORDER — FENTANYL 2.5 MCG/ML BUPIVACAINE 1/10 % EPIDURAL INFUSION (WH - ANES)
14.0000 mL/h | INTRAMUSCULAR | Status: DC
  Administered 2012-05-27 – 2012-05-28 (×5): 14 mL/h via EPIDURAL
  Filled 2012-05-27 (×5): qty 125

## 2012-05-27 MED ORDER — TERBUTALINE SULFATE 1 MG/ML IJ SOLN: 0.2500 mg | Freq: Once | INTRAMUSCULAR | Status: AC | PRN

## 2012-05-27 MED ORDER — DIPHENHYDRAMINE HCL 50 MG/ML IJ SOLN: 12.5000 mg | INTRAMUSCULAR | Status: DC | PRN

## 2012-05-27 MED ORDER — OXYTOCIN 40 UNITS IN LACTATED RINGERS INFUSION - SIMPLE MED: 1.0000 m[IU]/min | INTRAVENOUS | Status: DC

## 2012-05-27 MED ORDER — NALBUPHINE SYRINGE 5 MG/0.5 ML
10.0000 mg | INJECTION | INTRAMUSCULAR | Status: DC | PRN
  Administered 2012-05-27 (×2): 10 mg via INTRAVENOUS
  Filled 2012-05-27: qty 1
  Filled 2012-05-27 (×2): qty 0.5
  Filled 2012-05-27: qty 1
  Filled 2012-05-27: qty 0.5

## 2012-05-27 MED ORDER — NALBUPHINE SYRINGE 5 MG/0.5 ML
10.0000 mg | INJECTION | Freq: Four times a day (QID) | INTRAMUSCULAR | Status: DC | PRN
  Administered 2012-05-27: 10 mg via INTRAMUSCULAR
  Filled 2012-05-27 (×2): qty 1

## 2012-05-27 MED ORDER — CITRIC ACID-SODIUM CITRATE 334-500 MG/5ML PO SOLN
30.0000 mL | ORAL | Status: DC | PRN
  Filled 2012-05-27: qty 15

## 2012-05-27 MED ORDER — OXYCODONE-ACETAMINOPHEN 5-325 MG PO TABS
1.0000 | ORAL_TABLET | ORAL | Status: DC | PRN
  Administered 2012-05-28: 2 via ORAL
  Filled 2012-05-27: qty 2

## 2012-05-27 MED ORDER — LACTATED RINGERS IV SOLN: 500.0000 mL | Freq: Once | INTRAVENOUS | Status: DC

## 2012-05-27 MED ORDER — LACTATED RINGERS IV SOLN
INTRAVENOUS | Status: DC
  Administered 2012-05-27: 125 mL/h via INTRAVENOUS
  Administered 2012-05-27 – 2012-05-28 (×2): via INTRAVENOUS

## 2012-05-27 NOTE — Progress Notes (Signed)
Pt requesting more pain medicine, explain to pt that pt cannot have pain medicine till 0730.  Pt verb. Understanding.

## 2012-05-27 NOTE — Progress Notes (Signed)
Pt. States, pain medication is not "doing any good". Rn attempt to explain pain scale to patient - pt. Stated, "I know - just because I'm not screaming and crying doesn't mean I'm not in pain - it really hurts".  RN to call MD. Pt. Asking for epidural.

## 2012-05-27 NOTE — Anesthesia Procedure Notes (Signed)
Epidural Patient location during procedure: OB Start time: 05/27/2012 9:44 AM  Staffing Anesthesiologist: Angus Seller., Harrell Gave. Performed by: anesthesiologist   Preanesthetic Checklist Completed: patient identified, site marked, surgical consent, pre-op evaluation, timeout performed, IV checked, risks and benefits discussed and monitors and equipment checked  Epidural Patient position: sitting Prep: site prepped and draped and DuraPrep Patient monitoring: continuous pulse ox and blood pressure Approach: midline Injection technique: LOR air and LOR saline  Needle:  Needle type: Tuohy  Needle gauge: 17 G Needle length: 9 cm and 9 Needle insertion depth: 5 cm cm Catheter type: closed end flexible Catheter size: 19 Gauge Catheter at skin depth: 10 cm Test dose: negative  Assessment Events: blood not aspirated, injection not painful, no injection resistance, negative IV test and no paresthesia  Additional Notes Patient identified.  Risk benefits discussed including failed block, incomplete pain control, headache, nerve damage, paralysis, blood pressure changes, nausea, vomiting, reactions to medication both toxic or allergic, and postpartum back pain.  Patient expressed understanding and wished to proceed.  All questions were answered.  Sterile technique used throughout procedure and epidural site dressed with sterile barrier dressing. No paresthesia or other complications noted.The patient did not experience any signs of intravascular injection such as tinnitus or metallic taste in mouth nor signs of intrathecal spread such as rapid motor block. Please see nursing notes for vital signs.

## 2012-05-27 NOTE — H&P (Signed)
[name] is a 22 y.o. female presenting for UC's. Maternal Medical History:  Reason for admission: Reason for admission: rupture of membranes and contractions.  21 y o G1   EDC 05-25-12.  Presents SROM and UC's.  Contractions: Onset was 13-24 hours ago.   Frequency: regular.    Fetal activity: Perceived fetal activity is normal.   Last perceived fetal movement was within the past hour.    Prenatal complications: no prenatal complications Prenatal Complications - Diabetes: none.    OB History    Grav Para Term Preterm Abortions TAB SAB Ect Mult Living   1 0 0 0 0 0 0 0 0 0      Past Medical History  Diagnosis Date  . Hypoglycemia   . Ovarian cyst 2011  . Constipation   . Sickle cell trait    Past Surgical History  Procedure Date  . Laparoscopic ovarian cystectomy 2011  . Eye surgery     2004   Family History: family history includes Cancer in her paternal grandmother.  There is no history of Anesthesia problems and Hearing loss. Social History:  reports that she has never smoked. She has never used smokeless tobacco. She reports that she does not drink alcohol or use illicit drugs.   Prenatal Transfer Tool  Maternal Diabetes: No Genetic Screening: Normal Maternal Ultrasounds/Referrals: Normal Fetal Ultrasounds or other Referrals:  None Maternal Substance Abuse:  No Significant Maternal Medications:  None Significant Maternal Lab Results:  Lab values include: Group B Strep negative Other Comments:  None  Review of Systems  All other systems reviewed and are negative.    Dilation: 1 Effacement (%): 70 Station: -1 Blood pressure 134/89, pulse 90, temperature 98.7 F (37.1 C), temperature source Oral, resp. rate 20, height 5\' 4"  (1.626 m), weight 156 lb (70.761 kg), last menstrual period 08/19/2011. Maternal Exam:  Uterine Assessment: Contraction strength is moderate.  Abdomen: Patient reports no abdominal tenderness. Fetal presentation:  vertex  Introitus: Normal vulva. Normal vagina.  Pelvis: adequate for delivery.   Cervix: Cervix evaluated by digital exam.     Physical Exam  Nursing note and vitals reviewed. Constitutional: She is oriented to person, place, and time. She appears well-developed and well-nourished.  HENT:  Head: Normocephalic and atraumatic.  Eyes: Conjunctivae normal are normal. Pupils are equal, round, and reactive to light.  Neck: Normal range of motion. Neck supple.  Cardiovascular: Normal rate and regular rhythm.   Respiratory: Effort normal and breath sounds normal.  GI: Soft.  Genitourinary: Vagina normal and uterus normal.  Musculoskeletal: Normal range of motion.  Neurological: She is alert and oriented to person, place, and time.  Skin: Skin is warm and dry.  Psychiatric: She has a normal mood and affect. Her behavior is normal. Judgment and thought content normal.    Prenatal labs: ABO, Rh: --/--/O POS (05/25 1610) Antibody: Negative (08/27 0000) Rubella: Immune (08/27 0000) RPR: Nonreactive (08/27 0000)  HBsAg: Negative (08/27 0000)  HIV: Non-reactive (08/27 0000)  GBS: Negative (12/27 0000)   Assessment/Plan: 40.2 weeks.  SROM.  Early labor.   [name] A 05/27/2012, 5:13 AM

## 2012-05-27 NOTE — Progress Notes (Signed)
[name] is [name] 22 y.o. G1P0000 at [redacted]w[redacted]d by LMP admitted for rupture of membranes  Subjective:   Objective: BP 130/83  Pulse 109  Temp 98.8 F (37.1 C) (Oral)  Resp 18  Ht 5\' 4"  (1.626 m)  Wt 156 lb (70.761 kg)  BMI 26.78 kg/m2  SpO2 99%  LMP 08/19/2011 I/O last 3 completed shifts: In: 1490 [P.O.:240; I.V.:1250] Out: 450 [Urine:450]    FHT:  FHR: 150 bpm, variability: moderate,  accelerations:  Present,  decelerations:  Absent UC:   regular, every 2-5  minutes SVE:   Dilation: 4 Effacement (%): 70 Station: -1 Exam by:: [name]. [place], [facility]  Labs: Lab Results  Component Value Date   WBC 14.2* 05/27/2012   HGB 11.1* 05/27/2012   HCT 32.8* 05/27/2012   MCV 75.6* 05/27/2012   PLT 194 05/27/2012    Assessment / Plan: 40 weeks.  SROM.  Active labor.  Continue pitocin augmentation.  Labor: Slow active Phase. Preeclampsia:  n/[name] Fetal Wellbeing:  Category I Pain Control:  Epidural I/D:  n/[name] Anticipated MOD:  NSVD  [name] 05/27/2012, 10:16 PM

## 2012-05-27 NOTE — Progress Notes (Signed)
[name] is [name] 22 y.o. G1P0000 at [redacted]w[redacted]d by LMP admitted for rupture of membranes  Subjective:   Objective: BP 144/93  Pulse 86  Temp 98.3 F (36.8 C) (Oral)  Resp 20  Ht 5\' 4"  (1.626 m)  Wt 156 lb (70.761 kg)  BMI 26.78 kg/m2  LMP 08/19/2011      FHT:  FHR: 150 bpm, variability: moderate,  accelerations:  Present,  decelerations:  Absent UC:   regular, every 2-4 minutes SVE:   Dilation: 1 Effacement (%): 70 Station: 0 Exam by:: B.Cagna,RN  Labs: Lab Results  Component Value Date   WBC 14.2* 05/27/2012   HGB 11.1* 05/27/2012   HCT 32.8* 05/27/2012   MCV 75.6* 05/27/2012   PLT 194 05/27/2012    Assessment / Plan: Spontaneous labor, progressing normally  Labor: Progressing normally.  Latent phase.  Pitocin prn. Preeclampsia:  n/[name] Fetal Wellbeing:  Category I Pain Control:  Epidural I/D:  n/[name] Anticipated MOD:  NSVD  [name] 05/27/2012, 9:20 AM

## 2012-05-27 NOTE — Progress Notes (Signed)
RN to the bedside - maternal pulse tracing - u/s adjusted - FHR 127 bpm.

## 2012-05-27 NOTE — Progress Notes (Signed)
Report called to [facility] in [facility]. Pt to BS from triage via w/c to Rm 170

## 2012-05-27 NOTE — MAU Note (Signed)
Contractions since 2000. SROM at 0430 -cl fld

## 2012-05-27 NOTE — Anesthesia Preprocedure Evaluation (Signed)
Anesthesia Evaluation  Patient identified by MRN, date of birth, ID band Patient awake    Reviewed: Allergy & Precautions, H&P , Patient's Chart, lab work & pertinent test results  Airway Mallampati: II TM Distance: >3 FB Neck ROM: full    Dental No notable dental hx.    Pulmonary neg pulmonary ROS,  breath sounds clear to auscultation  Pulmonary exam normal       Cardiovascular negative cardio ROS  Rhythm:regular Rate:Normal     Neuro/Psych negative neurological ROS  negative psych ROS   GI/Hepatic negative GI ROS, Neg liver ROS,   Endo/Other  negative endocrine ROS  Renal/GU negative Renal ROS     Musculoskeletal   Abdominal   Peds  Hematology negative hematology ROS (+)   Anesthesia Other Findings Hypoglycemia     Ovarian cyst 2011      Constipation     Sickle cell trait    Reproductive/Obstetrics (+) Pregnancy                           Anesthesia Physical Anesthesia Plan  ASA: II  Anesthesia Plan: Epidural   Post-op Pain Management:    Induction:   Airway Management Planned:   Additional Equipment:   Intra-op Plan:   Post-operative Plan:   Informed Consent: I have reviewed the patients History and Physical, chart, labs and discussed the procedure including the risks, benefits and alternatives for the proposed anesthesia with the patient or authorized representative who has indicated his/her understanding and acceptance.     Plan Discussed with:   Anesthesia Plan Comments:         Anesthesia Quick Evaluation

## 2012-05-27 NOTE — Progress Notes (Signed)
Report given to Rebecca, RN

## 2012-05-28 ENCOUNTER — Encounter (HOSPITAL_COMMUNITY): Payer: Self-pay | Admitting: *Deleted

## 2012-05-28 LAB — PREPARE RBC (CROSSMATCH)

## 2012-05-28 MED ORDER — MEDROXYPROGESTERONE ACETATE 150 MG/ML IM SUSP: 150.0000 mg | INTRAMUSCULAR | Status: DC | PRN

## 2012-05-28 MED ORDER — DIPHENHYDRAMINE HCL 25 MG PO CAPS: 25.0000 mg | ORAL_CAPSULE | Freq: Four times a day (QID) | ORAL | Status: DC | PRN

## 2012-05-28 MED ORDER — IBUPROFEN 600 MG PO TABS
600.0000 mg | ORAL_TABLET | Freq: Four times a day (QID) | ORAL | Status: DC
  Administered 2012-05-28 – 2012-06-03 (×22): 600 mg via ORAL
  Filled 2012-05-28 (×24): qty 1

## 2012-05-28 MED ORDER — OXYTOCIN 40 UNITS IN LACTATED RINGERS INFUSION - SIMPLE MED
1.0000 m[IU]/min | INTRAVENOUS | Status: DC
  Administered 2012-05-28: 8 m[IU]/min via INTRAVENOUS
  Filled 2012-05-28: qty 1000

## 2012-05-28 MED ORDER — WITCH HAZEL-GLYCERIN EX PADS: 1.0000 "application " | MEDICATED_PAD | CUTANEOUS | Status: DC | PRN

## 2012-05-28 MED ORDER — SENNOSIDES-DOCUSATE SODIUM 8.6-50 MG PO TABS
2.0000 | ORAL_TABLET | Freq: Every day | ORAL | Status: DC
  Administered 2012-05-28 – 2012-06-01 (×5): 2 via ORAL

## 2012-05-28 MED ORDER — ONDANSETRON HCL 4 MG PO TABS
4.0000 mg | ORAL_TABLET | ORAL | Status: DC | PRN
  Filled 2012-05-28: qty 1

## 2012-05-28 MED ORDER — LANOLIN HYDROUS EX OINT: TOPICAL_OINTMENT | CUTANEOUS | Status: DC | PRN

## 2012-05-28 MED ORDER — OXYTOCIN 40 UNITS IN LACTATED RINGERS INFUSION - SIMPLE MED: 62.5000 mL/h | INTRAVENOUS | Status: DC

## 2012-05-28 MED ORDER — OXYCODONE-ACETAMINOPHEN 5-325 MG PO TABS
1.0000 | ORAL_TABLET | ORAL | Status: DC | PRN
  Administered 2012-05-28 – 2012-06-03 (×14): 2 via ORAL
  Filled 2012-05-28 (×14): qty 2

## 2012-05-28 MED ORDER — ONDANSETRON HCL 4 MG/2ML IJ SOLN
4.0000 mg | INTRAMUSCULAR | Status: DC | PRN
  Administered 2012-05-31 – 2012-06-02 (×2): 4 mg via INTRAVENOUS
  Filled 2012-05-28 (×2): qty 2

## 2012-05-28 MED ORDER — TETANUS-DIPHTH-ACELL PERTUSSIS 5-2.5-18.5 LF-MCG/0.5 IM SUSP
0.5000 mL | Freq: Once | INTRAMUSCULAR | Status: AC
  Administered 2012-05-29: 0.5 mL via INTRAMUSCULAR

## 2012-05-28 MED ORDER — DIBUCAINE 1 % RE OINT: 1.0000 "application " | TOPICAL_OINTMENT | RECTAL | Status: DC | PRN

## 2012-05-28 MED ORDER — BENZOCAINE-MENTHOL 20-0.5 % EX AERO
1.0000 "application " | INHALATION_SPRAY | CUTANEOUS | Status: DC | PRN
  Administered 2012-05-29: 1 via TOPICAL
  Filled 2012-05-28 (×2): qty 56

## 2012-05-28 MED ORDER — ZOLPIDEM TARTRATE 5 MG PO TABS: 5.0000 mg | ORAL_TABLET | Freq: Every evening | ORAL | Status: DC | PRN

## 2012-05-28 MED ORDER — PRENATAL MULTIVITAMIN CH
1.0000 | ORAL_TABLET | Freq: Every day | ORAL | Status: DC
  Administered 2012-05-29 – 2012-06-03 (×5): 1 via ORAL
  Filled 2012-05-28 (×5): qty 1

## 2012-05-28 MED ORDER — SIMETHICONE 80 MG PO CHEW: 80.0000 mg | CHEWABLE_TABLET | ORAL | Status: DC | PRN

## 2012-05-28 MED ORDER — OXYTOCIN 40 UNITS IN LACTATED RINGERS INFUSION - SIMPLE MED
62.5000 mL/h | INTRAVENOUS | Status: DC | PRN
  Administered 2012-05-28: 62.5 mL/h via INTRAVENOUS
  Filled 2012-05-28: qty 1000

## 2012-05-28 NOTE — Progress Notes (Signed)
Also, variables. Discussed position changes. SVE at 6 with station 1-2 plus, with head of hair visible. For nurse reassurance - Charge nurse verified SVE.  Pt. Also states, "feeling a lot of rectal pressure. Orders received to continue to monitor patient.  RN to the bedside for pericare.

## 2012-05-28 NOTE — Progress Notes (Signed)
[name] is [name] 22 y.o. G1P0000 at [redacted]w[redacted]d by LMP admitted for rupture of membranes  Subjective:   Objective: BP 123/77  Pulse 111  Temp 99.6 F (37.6 C) (Oral)  Resp 18  Ht 5\' 4"  (1.626 m)  Wt 156 lb (70.761 kg)  BMI 26.78 kg/m2  SpO2 99%  LMP 08/19/2011 I/O last 3 completed shifts: In: 1490 [P.O.:240; I.V.:1250] Out: 450 [Urine:450] Total I/O In: -  Out: 225 [Urine:225]  FHT:  FHR: 150 bpm, variability: moderate,  accelerations:  Present,  decelerations:  Absent UC:   regular, every 2-3 minutes SVE:   Dilation: 4 Effacement (%): 80 Station: 0;+1 Exam by:: [name]. [place], [facility]  Labs: Lab Results  Component Value Date   WBC 14.2* 05/27/2012   HGB 11.1* 05/27/2012   HCT 32.8* 05/27/2012   MCV 75.6* 05/27/2012   PLT 194 05/27/2012    Assessment / Plan: Protracted active phase  Labor: Protracted active phase.  Increase pitocin. Preeclampsia:  n/[name] Fetal Wellbeing:  Category I Pain Control:  Epidural I/D:  n/[name] Anticipated MOD:  NSVD  [name] 05/28/2012, 7:34 AM

## 2012-05-28 NOTE — Progress Notes (Addendum)
MD (Dr. [name]) notified of fetal tracing - rep. lates and no variability; SVE of 100/rim/+2; pt. Feeling pressure, fetal HR in 170's; currently giving 500 ml bolus; repositioning patient and giving 10L O2 via face mask. Charge nurse verbalized concern. Informed charge nurse of MD decision. - allow patient to labor down and progress to 10 cm.

## 2012-05-28 NOTE — Progress Notes (Signed)
Fatumata A Dunston is a 22 y.o. G1P0000 at [redacted]w[redacted]d by LMP admitted for rupture of membranes  Subjective:   Objective: BP 127/74  Pulse 126  Temp 99.1 F (37.3 C) (Oral)  Resp 18  Ht 5\' 4"  (1.626 m)  Wt 156 lb (70.761 kg)  BMI 26.78 kg/m2  SpO2 99%  LMP 08/19/2011 I/O last 3 completed shifts: In: 1490 [P.O.:240; I.V.:1250] Out: 450 [Urine:450] Total I/O In: -  Out: 675 [Urine:675]  FHT:  FHR: 150 bpm, variability: minimal ,  accelerations:  Abscent,  decelerations:  Absent UC:   regular, every 2-3 minutes SVE:   Dilation: Lip/rim Effacement (%): 100 Station: +2 Exam by:: Millner, RN   Labs: Lab Results  Component Value Date   WBC 14.2* 05/27/2012   HGB 11.1* 05/27/2012   HCT 32.8* 05/27/2012   MCV 75.6* 05/27/2012   PLT 194 05/27/2012    Assessment / Plan: Augmentation of labor, progressing well  Labor: Progressing normally Preeclampsia:  n/a Fetal Wellbeing:  Category II Pain Control:  Epidural I/D:  n/a Anticipated MOD:  NSVD  HARPER,CHARLES A 05/28/2012, 4:47 PM

## 2012-05-28 NOTE — Progress Notes (Signed)
Patient repositioned with HOB at 90 degrees, on knees, laying over HOB, swaying hips.

## 2012-05-28 NOTE — Progress Notes (Signed)
SVD, infant floppy, RROB RN called to delivery, NICU called by RROB to delivery.  Infant to NICU after stable; mother,father and grandmothers in the room to see infant before transported to NICU.   No skin-skin, no breastfeeding attempts - infant needed resuscitation. (see NICU notes)

## 2012-05-28 NOTE — Progress Notes (Signed)
[name] is a 22 y.o. G1P0000 at [redacted]w[redacted]d by LMP admitted for rupture of membranes  Subjective:   Objective: BP 128/82  Pulse 102  Temp 99.8 F (37.7 C) (Oral)  Resp 18  Ht 5\' 4"  (1.626 m)  Wt 156 lb (70.761 kg)  BMI 26.78 kg/m2  SpO2 99%  LMP 08/19/2011 I/O last 3 completed shifts: In: 1490 [P.O.:240; I.V.:1250] Out: 450 [Urine:450] Total I/O In: -  Out: 450 [Urine:450]  FHT:  FHR: 150 bpm, variability: moderate,  accelerations:  Present,  decelerations:  Present Variables UC:   regular, every 3 minutes SVE:   Dilation: 8 Effacement (%): 100 Station: +1 Exam by:: [name], MD  Labs: Lab Results  Component Value Date   WBC 14.2* 05/27/2012   HGB 11.1* 05/27/2012   HCT 32.8* 05/27/2012   MCV 75.6* 05/27/2012   PLT 194 05/27/2012    Assessment / Plan: Augmentation of labor, progressing well  Labor: Progressing normally Preeclampsia:  n/a Fetal Wellbeing:  Category I Pain Control:  Epidural I/D:  n/a Anticipated MOD:  NSVD  [name] A 05/28/2012, 1:02 PM

## 2012-05-28 NOTE — Progress Notes (Addendum)
Pt. Positioned on knees with HOB at 90 degrees; pt. Laying over [facility], FOB and nurse remains at the bedside for supervision and emotional support.  FHR - 170's, with mod. Variability.

## 2012-05-29 ENCOUNTER — Inpatient Hospital Stay (HOSPITAL_COMMUNITY): Admission: RE | Admit: 2012-05-29 | Payer: Medicaid Other | Source: Ambulatory Visit

## 2012-05-29 LAB — CBC
Hemoglobin: 9.9 g/dL — ABNORMAL LOW (ref 12.0–15.0)
MCH: 25.2 pg — ABNORMAL LOW (ref 26.0–34.0)
MCHC: 33.7 g/dL (ref 30.0–36.0)
MCV: 74.8 fL — ABNORMAL LOW (ref 78.0–100.0)
RBC: 3.93 MIL/uL (ref 3.87–5.11)

## 2012-05-29 MED ORDER — DEXTROSE 5 % IV SOLN
2.0000 g | Freq: Four times a day (QID) | INTRAVENOUS | Status: DC
  Administered 2012-05-29 – 2012-05-30 (×5): 2 g via INTRAVENOUS
  Filled 2012-05-29 (×6): qty 2

## 2012-05-29 MED ORDER — PHENOBARBITAL NICU INJ SYRINGE 65 MG/ML: 20.0000 mg/kg | INJECTION | Freq: Once | INTRAMUSCULAR | Status: DC

## 2012-05-29 NOTE — Progress Notes (Signed)
Post Partum Day 1 Subjective: no complaints  Objective: Blood pressure 124/78, pulse 89, temperature 98.4 F (36.9 C), temperature source Oral, resp. rate 16, height 5\' 4"  (1.626 m), weight 156 lb (70.761 kg), last menstrual period 08/19/2011, SpO2 98.00%, unknown if currently breastfeeding.  Physical Exam:  General: alert and no distress Lochia: appropriate Uterine Fundus: firm Incision: healing well DVT Evaluation: No evidence of DVT seen on physical exam.   Basename 05/29/12 0733 05/27/12 0500  HGB 9.9* 11.1*  HCT 29.4* 32.8*    Assessment/Plan: Plan for discharge tomorrow   LOS: 2 days   [name] A 05/29/2012, 8:21 AM

## 2012-05-29 NOTE — Plan of Care (Signed)
Problem: Consults Goal: Postpartum Patient Education (See Patient Education module for education specifics.) Outcome: Progressing Patient given baby and Me book and contents explained  Problem: Phase I Progression Outcomes Goal: Pain controlled with appropriate interventions Outcome: Completed/Met Date Met:  05/29/12 Good pain control with po Percocet Goal: Voiding adequately Outcome: Completed/Met Date Met:  05/29/12 Voided qs clear yellow urine x 2    Goal: OOB as tolerated unless otherwise ordered Outcome: Completed/Met Date Met:  05/29/12 Is ambulating better.Patient states left leg is no longer numb,she walks unsteady because her perineum hurts. Goal: VS, stable, temp < 100.4 degrees F Outcome: Progressing Blood pressures are a little elevated and so is her pulse Goal: Initial discharge plan identified Outcome: Completed/Met Date Met:  05/29/12 Pain controlled VSS Understands self care Understands when to call the MD Knows F/U care Goal: Other Phase I Outcomes/Goals Outcome: Completed/Met Date Met:  05/29/12 Voided qs after initial I&O cath  Problem: Phase II Progression Outcomes Goal: Progress activity as tolerated unless otherwise ordered Outcome: Completed/Met Date Met:  05/29/12 Walks more stable and steady Goal: Tolerating diet Outcome: Completed/Met Date Met:  05/29/12 Tolerated regular diet well

## 2012-05-29 NOTE — Progress Notes (Signed)
CSW has attempted numerous times to meet with MOB today to offer support and complete assessment, but she has not been in her room and has had numerous friends and family here with her today.  CSW will attempt again tomorrow.

## 2012-05-29 NOTE — Progress Notes (Signed)
05/29/12 1600  Clinical Encounter Type  Visited With Health care provider  Referral From Nurse Salena Saner, RN)    Referred by Salena Saner, RN to provide support to Ms [facility] as she copes with baby's unexpected complications.  Pt was with several friends and busy receiving update from medical staff on NICU when I attempted visit.  Will follow up tomorrow morning.  Please page chaplain at [phone] as needed, including overnight if urgent.  Thank you!  [address] [place], [place] [phone]

## 2012-05-29 NOTE — Anesthesia Postprocedure Evaluation (Signed)
  Anesthesia Post-op Note  Patient: [name]  Procedure(s) Performed: * No procedures listed *  Patient Location: Women's Unit  Anesthesia Type:Epidural  Level of Consciousness: awake  Airway and Oxygen Therapy: Patient Spontanous Breathing  Post-op Pain: none  Post-op Assessment: Patient's Cardiovascular Status Stable, Respiratory Function Stable, Patent Airway, No signs of Nausea or vomiting, Adequate PO intake, Pain level controlled, No headache, No backache, No residual numbness and No residual motor weakness  Post-op Vital Signs: Reviewed and stable  Complications: No apparent anesthesia complications

## 2012-05-29 NOTE — Progress Notes (Signed)
Ur chart review completed.  

## 2012-05-30 LAB — CREATININE, SERUM
Creatinine, Ser: 1.06 mg/dL (ref 0.50–1.10)
GFR calc Af Amer: 87 mL/min — ABNORMAL LOW (ref >90)
GFR calc non Af Amer: 75 mL/min — ABNORMAL LOW (ref >90)

## 2012-05-30 MED ORDER — GENTAMICIN SULFATE 40 MG/ML IJ SOLN
Freq: Three times a day (TID) | INTRAVENOUS | Status: DC
  Filled 2012-05-30 (×2): qty 3.75

## 2012-05-30 MED ORDER — SODIUM CHLORIDE 0.9 % IV SOLN
2.0000 g | Freq: Four times a day (QID) | INTRAVENOUS | Status: DC
  Filled 2012-05-30 (×3): qty 2000

## 2012-05-30 MED ORDER — CLINDAMYCIN PHOSPHATE 900 MG/50ML IV SOLN: 900.0000 mg | Freq: Three times a day (TID) | INTRAVENOUS | Status: DC

## 2012-05-30 MED ORDER — GENTAMICIN SULFATE 40 MG/ML IJ SOLN
Freq: Once | INTRAVENOUS | Status: AC
  Administered 2012-05-30: 12:00:00 via INTRAVENOUS
  Filled 2012-05-30: qty 4

## 2012-05-30 MED ORDER — AMPICILLIN-SULBACTAM SODIUM 3 (2-1) G IJ SOLR
3.0000 g | Freq: Four times a day (QID) | INTRAMUSCULAR | Status: DC
  Administered 2012-05-30 – 2012-06-03 (×15): 3 g via INTRAVENOUS
  Filled 2012-05-30 (×19): qty 3

## 2012-05-30 NOTE — Progress Notes (Signed)
05/30/12 1500  Clinical Encounter Type  Visited With Patient and family together (pt, her mother, other family on NICU)  Visit Type Follow-up    Greeted Ms Rolena Infante and her family in NICU.  She called me over to see [facility] and invited me to hold his hands.  Provided pastoral presence and listening, and then walked Ms Rolena Infante back up to [facility] Unit.    [address] [place], [place] [phone]

## 2012-05-30 NOTE — Progress Notes (Deleted)
Post Partum Day 1 Subjective: no complaints  Objective: Blood pressure 103/73, pulse 120, temperature 97.5 F (36.4 C), temperature source Oral, resp. rate 18, height 5\' 4"  (1.626 m), weight 156 lb (70.761 kg), last menstrual period 08/19/2011, SpO2 98.00%, unknown if currently breastfeeding.  Physical Exam:  General: alert and no distress Lochia: appropriate Uterine Fundus: firm Incision: healing well DVT Evaluation: No evidence of DVT seen on physical exam.   Basename 05/29/12 0733  HGB 9.9*  HCT 29.4*    Assessment/Plan: Plan for discharge tomorrow   LOS: 3 days   HARPER,CHARLES A 05/30/2012, 5:54 AM

## 2012-05-30 NOTE — Progress Notes (Signed)
CSW introduced to MOB at baby's bedside.  She was meeting with chaplain so CSW did not interrupt, but asked if CSW could meet with her tomorrow morning.  MOB looked very sleepy at this time as well.  MOB agreed.

## 2012-05-30 NOTE — Progress Notes (Signed)
Post Partum Day 2 Subjective: c/o lower abdominal pain.  Objective: Blood pressure 106/69, pulse 75, temperature 97.9 F (36.6 C), temperature source Oral, resp. rate 16, height 5\' 4"  (1.626 m), weight 156 lb (70.761 kg), last menstrual period 08/19/2011, SpO2 100.00%, unknown if currently breastfeeding.  Physical Exam:  General: alert and no distress Lochia: appropriate Uterine Fundus: Tender Incision: healing well DVT Evaluation: No evidence of DVT seen on physical exam.   Basename 05/29/12 0733  HGB 9.9*  HCT 29.4*    Assessment/Plan: Postpartum endometritis.  Will start Gent/Clinda.   LOS: 3 days   [name] 05/30/2012, 9:20 AM

## 2012-05-30 NOTE — Progress Notes (Signed)
CSW attempted again to meet with parents, but they are not in MOB's room.

## 2012-05-30 NOTE — Progress Notes (Signed)
ANTIBIOTIC CONSULT NOTE - INITIAL  Pharmacy Consult for Gentamicin Indication: PP endometritis  No Known Allergies  Patient Measurements: Height: 5\' 4"  (162.6 cm) Weight: 156 lb (70.761 kg) IBW/kg (Calculated) : 54.7  Adjusted Body Weight: 59.6  Vital Signs: Temp: 97.9 F (36.6 C) (02/04 0553) Temp src: Oral (02/04 0553) BP: 106/69 mmHg (02/04 0553) Pulse Rate: 75  (02/04 0553)  Labs:  Basename 05/29/12 0733  WBC 19.9*  HGB 9.9*  PLT 160  LABCREA --  CREATININE --  CRCLEARANCE --   No results found for this basename: GENTTROUGH:2,GENTPEAK:2,GENTRANDOM:2, in the last 72 hours   Microbiology: Recent Results (from the past 720 hour(s))  URINE CULTURE     Status: Normal   Collection Time   05/03/12 10:05 PM      Component Value Range Status Comment   Specimen Description URINE, CLEAN CATCH   Final    Special Requests NONE   Final    Culture  Setup Time 05/04/2012 05:42   Final    Colony Count 25,000 COLONIES/ML   Final    Culture     Final    Value: STAPHYLOCOCCUS SPECIES (COAGULASE NEGATIVE)     Note: RIFAMPIN AND GENTAMICIN SHOULD NOT BE USED AS SINGLE DRUGS FOR TREATMENT OF STAPH INFECTIONS.   Report Status 05/06/2012 FINAL   Final    Organism ID, Bacteria STAPHYLOCOCCUS SPECIES (COAGULASE NEGATIVE)   Final     Medications:  Cefoxitin 2 grams started on 2/1 until 2/4 when changed to triples.  Ampicillin 2 grams every 6 hours and clindamycin 900 mg every 8 hours.   Assessment: 22 y.o. female G1P1001 with vaginal delivery on 05/28/12.  Now with abdominal tenderness and presumed post partum endometritis.   Estimated Ke = 0.331, Vd = 0.38L/kg  Goal of Therapy:  Gentamicin peak 6-8 mg/L and Trough < 1 mg/L  Plan:  Gentamicin 160 mg IV x 1  Gentamicin 150 mg IV every 8 hrs  Check Scr this morning. Will check gentamicin levels if continued > 72hr or clinically indicated.  [name] 05/30/2012,9:48 AM

## 2012-05-30 NOTE — Progress Notes (Signed)
05/30/12 1200  Clinical Encounter Type  Visited With Patient  Visit Type Initial    Made initial contact with Ms [name] to introduce spiritual/emotional support services.  After brief introductory conversation, pt continued her work with lactation consultant Esaw Dace; we plan to follow up later.  [address] [place], [place] [phone]

## 2012-05-30 NOTE — Progress Notes (Signed)
05/30/12 1231  Clinical Encounter Type  Visited With Patient  Visit Type Spiritual support;Social support    Completed initial visit to offer spiritual and emotional support.  Ms [name] describes herself as "not a worrier" by nature, names that baby is progressing in NICU, and states that "he's going to be fine."  Per pt, she has no other needs at this time.    Let Ms [name] know of ongoing chaplain availability.  Provided pastoral presence and listening.  [address] [place], [place] [phone]

## 2012-05-31 LAB — TYPE AND SCREEN
ABO/RH(D): O POS
Antibody Screen: NEGATIVE
Unit division: 0

## 2012-05-31 NOTE — Progress Notes (Signed)
Post Partum Day 3 Subjective: C/O lower abdominal pain.  Objective: Blood pressure 117/76, pulse 79, temperature 97.5 F (36.4 C), temperature source Oral, resp. rate 18, height 5\' 4"  (1.626 m), weight 156 lb (70.761 kg), last menstrual period 08/19/2011, SpO2 98.00%, unknown if currently breastfeeding.  Physical Exam:  General: alert and icteric Lochia: appropriate Uterine Fundus: firm, tender Incision: healing well DVT Evaluation: No evidence of DVT seen on physical exam.   Basename 05/29/12 0733  HGB 9.9*  HCT 29.4*    Assessment/Plan: Postpartum endometritis.  Stable.  Continue IV antibiotics.   LOS: 4 days   HARPER,CHARLES A 05/31/2012, 5:37 AM

## 2012-06-01 DIAGNOSIS — O8612 Endometritis following delivery: Secondary | ICD-10-CM | POA: Diagnosis not present

## 2012-06-01 LAB — CBC WITH DIFFERENTIAL/PLATELET
Basophils Relative: 0 % (ref 0–1)
Eosinophils Absolute: 0.3 10*3/uL (ref 0.0–0.7)
Eosinophils Relative: 3 % (ref 0–5)
Hemoglobin: 9.8 g/dL — ABNORMAL LOW (ref 12.0–15.0)
Lymphs Abs: 1.9 10*3/uL (ref 0.7–4.0)
MCH: 25 pg — ABNORMAL LOW (ref 26.0–34.0)
MCHC: 33.7 g/dL (ref 30.0–36.0)
MCV: 74.2 fL — ABNORMAL LOW (ref 78.0–100.0)
Monocytes Absolute: 0.9 10*3/uL (ref 0.1–1.0)
Monocytes Relative: 8 % (ref 3–12)
RBC: 3.92 MIL/uL (ref 3.87–5.11)

## 2012-06-01 LAB — COMPREHENSIVE METABOLIC PANEL
Alkaline Phosphatase: 128 U/L — ABNORMAL HIGH (ref 39–117)
BUN: 6 mg/dL (ref 6–23)
Calcium: 8.4 mg/dL (ref 8.4–10.5)
Creatinine, Ser: 0.92 mg/dL (ref 0.50–1.10)
GFR calc Af Amer: >90 mL/min (ref >90)
Glucose, Bld: 80 mg/dL (ref 70–99)
Total Protein: 5.3 g/dL — ABNORMAL LOW (ref 6.0–8.3)

## 2012-06-01 MED ORDER — SODIUM CHLORIDE 0.9 % IJ SOLN
3.0000 mL | INTRAMUSCULAR | Status: DC | PRN
  Administered 2012-06-01 – 2012-06-02 (×2): 3 mL via INTRAVENOUS

## 2012-06-01 NOTE — Progress Notes (Signed)
Patient ID: [name], female   DOB: 1991/04/09, 21 y.o.   MRN: [id number] Subjective: Interval History: less pain  Objective: Vital signs in last 24 hours: Temp:  [97.8 F (36.6 C)-98.4 F (36.9 C)] 98.4 F (36.9 C) (02/06 0325) Pulse Rate:  [74-88] 74  (02/06 0325) Resp:  [16-22] 16  (02/06 0325) BP: (115-127)/(39-88) 120/39 mmHg (02/06 0325) SpO2:  [99 %-100 %] 100 % (02/06 0325) Weight:  [70.761 kg (156 lb)] 70.761 kg (156 lb) (02/05 1110)   Abd: fundus tender Ext: NT  No results found for this or any previous visit (from the past 24 hour(s)).    Scheduled Meds:   . ampicillin-sulbactam (UNASYN) IV  3 g Intravenous Q6H  . ibuprofen  600 mg Oral Q6H  . prenatal multivitamin  1 tablet Oral Daily  . senna-docusate  2 tablet Oral QHS   Continuous Infusions:   . oxytocin 40 units in LR 1000 mL 62.5 mL/hr (05/28/12 2331)   PRN Meds:benzocaine-Menthol, dibucaine, diphenhydrAMINE, lanolin, medroxyPROGESTERone, ondansetron (ZOFRAN) IV, ondansetron, oxyCODONE-acetaminophen, oxytocin 40 units in LR 1000 mL, simethicone, sodium chloride, witch hazel-glycerin, zolpidem  Assessment/Plan: Endometritis following delivery; persistent pain  Continue abx Check labs Consider imaging if protracted course    LOS: 5 days   JACKSON-MOORE,Jacek Colson A

## 2012-06-01 NOTE — Progress Notes (Signed)
UR completed 

## 2012-06-01 NOTE — Progress Notes (Signed)
This was a follow-up visit with [name] who was in good spirits this morning.  She reported that her son is doing better and she is looking forward to being home with him soon.  This visit gave her space to share the story of her difficult pregnancy and offered her continued support as she copes with having her baby in the NICU.  Please page as needs arise, [phone].  Agnes Lawrence Sahas Sluka 12:45 PM   06/01/12 1200  Clinical Encounter Type  Visited With Patient  Visit Type Follow-up

## 2012-06-01 NOTE — Progress Notes (Signed)
Post Partum Day 4 Subjective: Lower abdominal pain.  Objective: Blood pressure 120/39, pulse 74, temperature 98.4 F (36.9 C), temperature source Oral, resp. rate 16, height 5\' 4"  (1.626 m), weight 156 lb (70.761 kg), last menstrual period 08/19/2011, SpO2 100.00%, unknown if currently breastfeeding.  Physical Exam:  General: alert and no distress Lochia: appropriate Uterine Fundus: firm, tender. Incision: healing well DVT Evaluation: No evidence of DVT seen on physical exam.   Basename 05/29/12 0733  HGB 9.9*  HCT 29.4*    Assessment/Plan: Endometritis.  Improved.  Continue IV antibiotics until uterine tenderness resolves.   LOS: 5 days   [name] A 06/01/2012, 6:39 AM

## 2012-06-01 NOTE — Clinical Social Work Maternal (Signed)
    Clinical Social Work Department PSYCHOSOCIAL ASSESSMENT - MATERNAL/CHILD 05/31/2012  Patient:  [name], [name]  Account Number:  [id number]  Admit Date:  05/27/2012  Marjo Bicker Name:   [facility]    Clinical Social Worker:  Lulu Riding, [place]   Date/Time:  05/31/2012 10:30 AM  Date Referred:  05/31/2012   Referral source  NICU     Referred reason  NICU   Other referral source:    I:  FAMILY / HOME ENVIRONMENT Child's legal guardian:  PARENT  Guardian - Name Guardian - Age Guardian - Address  Lavona Dunston 21 [address] [place], [place] 28  [place]     Other household support members/support persons Other support:   MOB states she has a great support system.  Numerous friends and family members have been present with her in the hospital.    II  PSYCHOSOCIAL DATA Information Source:  Patient Interview  Event organiser Employment:   Surveyor, quantity resources:  Media planner If [employer] - County:  [employer] / Grade:   Maternity Care Coordinator / Child Services Coordination / Early Interventions:   CC4C and CDSA/Early Intervention  Cultural issues impacting care:   None indicated.    III  STRENGTHS Strengths  Adequate Resources  Compliance with medical plan  Home prepared for Child (including basic supplies)  Supportive family/friends   Strength comment:    IV  RISK FACTORS AND CURRENT PROBLEMS Current Problem:  None   Risk Factor & Current Problem Patient Issue Family Issue Risk Factor / Current Problem Comment   N N     V  SOCIAL WORK ASSESSMENT CSW met with MOB in her third floor room/305 to complete assessment and evaluate how she is coping with baby's illness and admission to NICU.  MOB did not seem interested in talking with CSW and looked at her cell phone the whole time CSW was in the room.  She states she has a great support system, FOB invovled and supportive and everything she needs for baby at  home.  She states no issues with transportation to visit the baby after her discharge.  CSW attempted to inform her of the baby's possible eligibility for SSI and she replied, "he's fine."  She does not wish to apply.  MOB states baby has private insurance on his grandparents policy.  CSW unaware that this is possible and mentioned to MOB that many insurance companies will not add a baby to a grandparent's policy.  She assured me that the baby is covered under [employer].  (CSW spoke to care manager/T. Laural Benes who states the cashier's office staff state the baby is not going to be able to be added to the insurance policy of the grandparents.   CSW made care manager aware that MOB is sure he will.)  CSW explained support services offered by NICU CSW and asked her to call CSW if she has any questions or needs.  She states none at this time.      VI SOCIAL WORK PLAN Social Work Plan  Psychosocial Support/Ongoing Assessment of Needs   Type of pt/family education:   Possible SSI eligibility   If child protective services report - county:   If child protective services report - date:   Information/referral to community resources comment:   Arts development officer   Other social work plan:

## 2012-06-02 NOTE — Progress Notes (Signed)
Post Partum Day 5 Subjective: no complaints, less pain but still present.  Objective: Blood pressure 140/73, pulse 62, temperature 98.2 F (36.8 C), temperature source Oral, resp. rate 16, height 5\' 4"  (1.626 m), weight 156 lb (70.761 kg), last menstrual period 08/19/2011, SpO2 100.00%, unknown if currently breastfeeding.  Physical Exam:  General: alert and no distress Lochia: appropriate Uterine Fundus: firm, tender Incision: healing well DVT Evaluation: No evidence of DVT seen on physical exam. Results for DONICE, ALPERIN (MRN [id number]) as of 06/02/2012 11:40  Ref. Range 06/01/2012 09:00  Sodium Latest Range: 135-145 mEq/L 137  Potassium Latest Range: 3.5-5.1 mEq/L 3.6  Chloride Latest Range: 96-112 mEq/L 101  CO2 Latest Range: 19-32 mEq/L 25  BUN Latest Range: 6-23 mg/dL 6  Creatinine Latest Range: 0.50-1.10 mg/dL 4.09  Calcium Latest Range: 8.4-10.5 mg/dL 8.4  GFR calc non Af Amer Latest Range: >90 mL/min 89 (L)  GFR calc Af Amer Latest Range: >90 mL/min >90  Glucose Latest Range: 70-99 mg/dL 80  Alkaline Phosphatase Latest Range: 39-117 U/L 128 (H)  Albumin Latest Range: 3.5-5.2 g/dL 2.2 (L)  AST Latest Range: 0-37 U/L 25  ALT Latest Range: 0-35 U/L 24  Total Protein Latest Range: 6.0-8.3 g/dL 5.3 (L)  Total Bilirubin Latest Range: 0.3-1.2 mg/dL 0.2 (L)  WBC Latest Range: 4.0-10.5 K/uL 10.9 (H)  RBC Latest Range: 3.87-5.11 MIL/uL 3.92  Hemoglobin Latest Range: 12.0-15.0 g/dL 9.8 (L)  HCT Latest Range: 36.0-46.0 % 29.1 (L)  MCV Latest Range: 78.0-100.0 fL 74.2 (L)  MCH Latest Range: 26.0-34.0 pg 25.0 (L)  MCHC Latest Range: 30.0-36.0 g/dL 81.1  RDW Latest Range: 11.5-15.5 % 14.9  Platelets Latest Range: 150-400 K/uL 221    Basename 06/01/12 0900  HGB 9.8*  HCT 29.1*    Assessment/Plan: Postpartum endometritis.  Improving.  Continue IV antibiotics until tenderness resolves.   LOS: 6 days   [name] A 06/02/2012, 11:33 AM

## 2012-06-03 DIAGNOSIS — O9902 Anemia complicating childbirth: Secondary | ICD-10-CM | POA: Diagnosis not present

## 2012-06-03 MED ORDER — OXYCODONE-ACETAMINOPHEN 5-325 MG PO TABS: 2.0000 | ORAL_TABLET | Freq: Four times a day (QID) | ORAL | Status: DC | PRN

## 2012-06-03 MED ORDER — PRENATAL MULTIVITAMIN CH: 1.0000 | ORAL_TABLET | Freq: Every day | ORAL | Status: DC

## 2012-06-03 MED ORDER — AMOXICILLIN-POT CLAVULANATE 875-125 MG PO TABS: 1.0000 | ORAL_TABLET | Freq: Two times a day (BID) | ORAL | Status: DC

## 2012-06-03 NOTE — Discharge Summary (Signed)
  Obstetric Discharge Summary Reason for Admission: rupture of membranes Prenatal Procedures: none Intrapartum Procedures: spontaneous vaginal delivery Postpartum Procedures: antibiotics Complications-Operative and Postpartum: endometritis  Hemoglobin  Date Value Range Status  06/01/2012 9.8* 12.0 - 15.0 g/dL Final     HCT  Date Value Range Status  06/01/2012 29.1* 36.0 - 46.0 % Final    Physical Exam:  General: alert Lochia: appropriate Uterine: slightly tender Incision: n/a DVT Evaluation: No evidence of DVT seen on physical exam.  Discharge Diagnoses: Active Problems:   Endometritis following delivery   Normal delivery   Anemia of mother, with delivery   Discharge Information: Date: 06/03/2012 Activity: pelvic rest Diet: routine Medications:  Prior to Admission medications   Medication Sig Start Date End Date Taking? Authorizing Provider  calcium carbonate (TUMS - DOSED IN MG ELEMENTAL CALCIUM) 500 MG chewable tablet Chew 4 tablets by mouth every 4 (four) hours as needed. For acid reflux   Yes Historical Provider, MD  amoxicillin-clavulanate (AUGMENTIN) 875-125 MG per tablet Take 1 tablet by mouth 2 (two) times daily. 06/03/12   Antionette Char, MD  oxyCODONE-acetaminophen (PERCOCET) 5-325 MG per tablet Take 2 tablets by mouth every 6 (six) hours as needed for pain. 06/03/12   Antionette Char, MD    Condition: stable Instructions: refer to routine discharge instructions Discharge to: home Follow-up Information   Follow up with HARPER,CHARLES A, MD In 3 days.   Contact information:   [address] [place] [address] [phone]       Newborn Data: Live born  Information for the patient's newborn:  Murl, Zogg [[id number]]  female  ; APGAR (1 MIN): 1   APGAR (5 MINS): 4   APGAR (10 MINS): 6    JACKSON-MOORE,Asal Teas A 06/03/2012, 11:08 AM

## 2012-06-03 NOTE — Progress Notes (Signed)
Discharge instructions provided to patient and significant other at bedside.  Medications, activities, follow up appointments, breast care and community resources discussed.  No questions at this time.  patient left unit with prescriptions and personal belongings in stable condition.  Patient accompanied off unit by staff.  Osvaldo Angst, RN-------

## 2012-06-05 ENCOUNTER — Other Ambulatory Visit (HOSPITAL_COMMUNITY): Payer: Self-pay | Admitting: Internal Medicine

## 2012-06-05 ENCOUNTER — Other Ambulatory Visit (HOSPITAL_COMMUNITY): Payer: Self-pay | Admitting: Obstetrics & Gynecology

## 2012-06-05 ENCOUNTER — Other Ambulatory Visit: Payer: Self-pay | Admitting: Urology

## 2012-06-05 ENCOUNTER — Other Ambulatory Visit (HOSPITAL_COMMUNITY): Payer: Self-pay | Admitting: Endocrinology

## 2012-06-05 ENCOUNTER — Ambulatory Visit (HOSPITAL_COMMUNITY)
Admission: RE | Admit: 2012-06-05 | Discharge: 2012-06-05 | Disposition: A | Discharge status: Home / Self Care | Payer: Medicaid Other | Source: Ambulatory Visit | Attending: Obstetrics & Gynecology | Admitting: Obstetrics & Gynecology

## 2012-06-05 ENCOUNTER — Encounter (HOSPITAL_COMMUNITY): Payer: Self-pay

## 2012-06-05 ENCOUNTER — Other Ambulatory Visit (HOSPITAL_COMMUNITY): Payer: Self-pay | Admitting: Obstetrics

## 2012-06-05 DIAGNOSIS — O85 Puerperal sepsis: Secondary | ICD-10-CM

## 2012-06-05 DIAGNOSIS — O864 Pyrexia of unknown origin following delivery: Secondary | ICD-10-CM | POA: Insufficient documentation

## 2012-06-05 DIAGNOSIS — R109 Unspecified abdominal pain: Secondary | ICD-10-CM | POA: Insufficient documentation

## 2012-06-05 DIAGNOSIS — O99893 Other specified diseases and conditions complicating puerperium: Secondary | ICD-10-CM | POA: Insufficient documentation

## 2012-06-05 MED ORDER — IOHEXOL 300 MG/ML  SOLN: 100.0000 mL | Freq: Once | INTRAMUSCULAR | Status: AC | PRN

## 2012-06-05 MED ORDER — IOHEXOL 300 MG/ML  SOLN: 50.0000 mL | INTRAMUSCULAR | Status: AC

## 2012-06-16 ENCOUNTER — Encounter (HOSPITAL_COMMUNITY): Payer: Self-pay | Admitting: Emergency Medicine

## 2012-06-16 ENCOUNTER — Emergency Department (HOSPITAL_COMMUNITY): Payer: Medicaid Other | Admitting: Certified Registered"

## 2012-06-16 ENCOUNTER — Inpatient Hospital Stay (HOSPITAL_COMMUNITY)
Admission: EM | Admit: 2012-06-16 | Discharge: 2012-06-20 | DRG: 769 | Disposition: A | Discharge status: Home / Self Care | Payer: Medicaid Other | Attending: General Surgery | Admitting: General Surgery

## 2012-06-16 ENCOUNTER — Encounter (HOSPITAL_COMMUNITY): Payer: Self-pay | Admitting: Certified Registered"

## 2012-06-16 ENCOUNTER — Emergency Department (HOSPITAL_COMMUNITY): Payer: Medicaid Other

## 2012-06-16 ENCOUNTER — Encounter (HOSPITAL_COMMUNITY): Admission: EM | Disposition: A | Discharge status: Home / Self Care | Payer: Self-pay | Source: Home / Self Care

## 2012-06-16 DIAGNOSIS — D62 Acute posthemorrhagic anemia: Secondary | ICD-10-CM | POA: Diagnosis present

## 2012-06-16 DIAGNOSIS — O9081 Anemia of the puerperium: Secondary | ICD-10-CM | POA: Diagnosis present

## 2012-06-16 DIAGNOSIS — D573 Sickle-cell trait: Secondary | ICD-10-CM | POA: Diagnosis present

## 2012-06-16 DIAGNOSIS — K265 Chronic or unspecified duodenal ulcer with perforation: Secondary | ICD-10-CM | POA: Diagnosis present

## 2012-06-16 DIAGNOSIS — O8612 Endometritis following delivery: Secondary | ICD-10-CM | POA: Diagnosis present

## 2012-06-16 DIAGNOSIS — K251 Acute gastric ulcer with perforation: Secondary | ICD-10-CM

## 2012-06-16 DIAGNOSIS — O9089 Other complications of the puerperium, not elsewhere classified: Principal | ICD-10-CM | POA: Diagnosis present

## 2012-06-16 DIAGNOSIS — K261 Acute duodenal ulcer with perforation: Secondary | ICD-10-CM

## 2012-06-16 HISTORY — PX: REPAIR OF PERFORATED ULCER: SHX6065

## 2012-06-16 HISTORY — PX: LAPAROTOMY: SHX154

## 2012-06-16 LAB — CBC WITH DIFFERENTIAL/PLATELET
Basophils Absolute: 0 10*3/uL (ref 0.0–0.1)
HCT: 36.4 % (ref 36.0–46.0)
Lymphocytes Relative: 24 % (ref 12–46)
Lymphs Abs: 1.9 10*3/uL (ref 0.7–4.0)
Monocytes Absolute: 0.5 10*3/uL (ref 0.1–1.0)
Neutro Abs: 5 10*3/uL (ref 1.7–7.7)
RBC: 4.82 MIL/uL (ref 3.87–5.11)
RDW: 16.3 % — ABNORMAL HIGH (ref 11.5–15.5)
WBC: 8.1 10*3/uL (ref 4.0–10.5)

## 2012-06-16 LAB — COMPREHENSIVE METABOLIC PANEL
ALT: 19 U/L (ref 0–35)
AST: 22 U/L (ref 0–37)
CO2: 26 mEq/L (ref 19–32)
Chloride: 105 mEq/L (ref 96–112)
Creatinine, Ser: 1.15 mg/dL — ABNORMAL HIGH (ref 0.50–1.10)
GFR calc non Af Amer: 68 mL/min — ABNORMAL LOW (ref >90)
Glucose, Bld: 67 mg/dL — ABNORMAL LOW (ref 70–99)
Sodium: 140 mEq/L (ref 135–145)
Total Bilirubin: 0.4 mg/dL (ref 0.3–1.2)

## 2012-06-16 LAB — URINALYSIS, ROUTINE W REFLEX MICROSCOPIC
Bilirubin Urine: NEGATIVE
Glucose, UA: NEGATIVE mg/dL
Specific Gravity, Urine: 1.017 (ref 1.005–1.030)
pH: 6 (ref 5.0–8.0)

## 2012-06-16 SURGERY — LAPAROTOMY, EXPLORATORY
Anesthesia: General | Site: Abdomen | Wound class: Dirty or Infected

## 2012-06-16 MED ORDER — HYDROMORPHONE HCL PF 1 MG/ML IJ SOLN
0.2500 mg | INTRAMUSCULAR | Status: DC | PRN
  Administered 2012-06-16 (×5): 0.5 mg via INTRAVENOUS

## 2012-06-16 MED ORDER — DIAZEPAM 5 MG/ML IJ SOLN
5.0000 mg | Freq: Once | INTRAMUSCULAR | Status: AC
  Administered 2012-06-16: 5 mg via INTRAVENOUS
  Filled 2012-06-16: qty 2

## 2012-06-16 MED ORDER — MORPHINE SULFATE (PF) 1 MG/ML IV SOLN
INTRAVENOUS | Status: AC
  Filled 2012-06-16: qty 25

## 2012-06-16 MED ORDER — ROCURONIUM BROMIDE 100 MG/10ML IV SOLN
INTRAVENOUS | Status: DC | PRN
  Administered 2012-06-16: 30 mg via INTRAVENOUS

## 2012-06-16 MED ORDER — ONDANSETRON HCL 4 MG/2ML IJ SOLN
INTRAMUSCULAR | Status: AC
  Administered 2012-06-16: 4 mg via INTRAVENOUS
  Filled 2012-06-16: qty 2

## 2012-06-16 MED ORDER — ONDANSETRON HCL 4 MG/2ML IJ SOLN: 4.0000 mg | Freq: Four times a day (QID) | INTRAMUSCULAR | Status: DC | PRN

## 2012-06-16 MED ORDER — LIDOCAINE HCL (CARDIAC) 20 MG/ML IV SOLN
INTRAVENOUS | Status: DC | PRN
  Administered 2012-06-16: 100 mg via INTRAVENOUS

## 2012-06-16 MED ORDER — NALOXONE HCL 0.4 MG/ML IJ SOLN: 0.4000 mg | INTRAMUSCULAR | Status: DC | PRN

## 2012-06-16 MED ORDER — SUFENTANIL CITRATE 50 MCG/ML IV SOLN
INTRAVENOUS | Status: DC | PRN
  Administered 2012-06-16: 20 ug via INTRAVENOUS
  Administered 2012-06-16: 10 ug via INTRAVENOUS

## 2012-06-16 MED ORDER — HEPARIN SODIUM (PORCINE) 5000 UNIT/ML IJ SOLN
5000.0000 [IU] | Freq: Three times a day (TID) | INTRAMUSCULAR | Status: DC
  Administered 2012-06-17 – 2012-06-20 (×11): 5000 [IU] via SUBCUTANEOUS
  Filled 2012-06-16 (×13): qty 1

## 2012-06-16 MED ORDER — HYDROMORPHONE 0.3 MG/ML IV SOLN
INTRAVENOUS | Status: AC
  Filled 2012-06-16: qty 25

## 2012-06-16 MED ORDER — IOHEXOL 300 MG/ML  SOLN
100.0000 mL | Freq: Once | INTRAMUSCULAR | Status: AC | PRN
  Administered 2012-06-16: 100 mL via INTRAVENOUS

## 2012-06-16 MED ORDER — HYDROMORPHONE HCL PF 1 MG/ML IJ SOLN
1.0000 mg | Freq: Once | INTRAMUSCULAR | Status: AC
  Administered 2012-06-16: 1 mg via INTRAVENOUS
  Filled 2012-06-16: qty 1

## 2012-06-16 MED ORDER — SODIUM CHLORIDE 0.9 % IV SOLN
INTRAVENOUS | Status: DC
  Administered 2012-06-16 – 2012-06-19 (×4): via INTRAVENOUS
  Administered 2012-06-19: 100 mL/h via INTRAVENOUS

## 2012-06-16 MED ORDER — DIPHENHYDRAMINE HCL 12.5 MG/5ML PO ELIX
12.5000 mg | ORAL_SOLUTION | Freq: Four times a day (QID) | ORAL | Status: DC | PRN
  Filled 2012-06-16: qty 5

## 2012-06-16 MED ORDER — HYDROMORPHONE HCL PF 1 MG/ML IJ SOLN
INTRAMUSCULAR | Status: AC
  Filled 2012-06-16: qty 1

## 2012-06-16 MED ORDER — NEOSTIGMINE METHYLSULFATE 1 MG/ML IJ SOLN
INTRAMUSCULAR | Status: DC | PRN
  Administered 2012-06-16: 3 mg via INTRAVENOUS

## 2012-06-16 MED ORDER — LIDOCAINE HCL 4 % MT SOLN
OROMUCOSAL | Status: DC | PRN
  Administered 2012-06-16: 4 mL via TOPICAL

## 2012-06-16 MED ORDER — PANTOPRAZOLE SODIUM 40 MG IV SOLR
40.0000 mg | Freq: Two times a day (BID) | INTRAVENOUS | Status: DC
  Administered 2012-06-17 – 2012-06-20 (×8): 40 mg via INTRAVENOUS
  Filled 2012-06-16 (×10): qty 40

## 2012-06-16 MED ORDER — LACTATED RINGERS IV SOLN
INTRAVENOUS | Status: DC | PRN
  Administered 2012-06-16: 21:00:00 via INTRAVENOUS

## 2012-06-16 MED ORDER — DEXTROSE 50 % IV SOLN: 50.0000 mL | Freq: Once | INTRAVENOUS | Status: DC

## 2012-06-16 MED ORDER — MORPHINE SULFATE (PF) 1 MG/ML IV SOLN
INTRAVENOUS | Status: DC
  Administered 2012-06-16: 22:00:00 via INTRAVENOUS

## 2012-06-16 MED ORDER — DIPHENHYDRAMINE HCL 50 MG/ML IJ SOLN: 12.5000 mg | Freq: Four times a day (QID) | INTRAMUSCULAR | Status: DC | PRN

## 2012-06-16 MED ORDER — ONDANSETRON HCL 4 MG/2ML IJ SOLN
INTRAMUSCULAR | Status: DC | PRN
  Administered 2012-06-16: 4 mg via INTRAVENOUS

## 2012-06-16 MED ORDER — IOHEXOL 300 MG/ML  SOLN: 25.0000 mL | INTRAMUSCULAR | Status: AC

## 2012-06-16 MED ORDER — ONDANSETRON HCL 4 MG/2ML IJ SOLN: 4.0000 mg | Freq: Once | INTRAMUSCULAR | Status: DC | PRN

## 2012-06-16 MED ORDER — MORPHINE SULFATE 4 MG/ML IJ SOLN
4.0000 mg | Freq: Once | INTRAMUSCULAR | Status: AC
  Administered 2012-06-16: 4 mg via INTRAVENOUS
  Filled 2012-06-16: qty 1

## 2012-06-16 MED ORDER — SODIUM CHLORIDE 0.9 % IV BOLUS (SEPSIS)
1000.0000 mL | Freq: Once | INTRAVENOUS | Status: AC
  Administered 2012-06-16: 1000 mL via INTRAVENOUS

## 2012-06-16 MED ORDER — PROPOFOL 10 MG/ML IV BOLUS
INTRAVENOUS | Status: DC | PRN
  Administered 2012-06-16: 200 mg via INTRAVENOUS

## 2012-06-16 MED ORDER — SODIUM CHLORIDE 0.9 % IV SOLN
INTRAVENOUS | Status: DC | PRN
  Administered 2012-06-16: 20:00:00 via INTRAVENOUS

## 2012-06-16 MED ORDER — SODIUM CHLORIDE 0.9 % IJ SOLN: 9.0000 mL | INTRAMUSCULAR | Status: DC | PRN

## 2012-06-16 MED ORDER — ONDANSETRON HCL 4 MG/2ML IJ SOLN: 4.0000 mg | Freq: Once | INTRAMUSCULAR | Status: AC

## 2012-06-16 MED ORDER — DEXTROSE 5 % IV SOLN
2.0000 g | Freq: Once | INTRAVENOUS | Status: AC
  Administered 2012-06-16: 2 g via INTRAVENOUS
  Filled 2012-06-16: qty 2

## 2012-06-16 MED ORDER — MIDAZOLAM HCL 5 MG/5ML IJ SOLN
INTRAMUSCULAR | Status: DC | PRN
  Administered 2012-06-16: 2 mg via INTRAVENOUS

## 2012-06-16 MED ORDER — SODIUM CHLORIDE 0.9 % IV SOLN
3.0000 g | Freq: Four times a day (QID) | INTRAVENOUS | Status: DC
  Administered 2012-06-17 – 2012-06-20 (×15): 3 g via INTRAVENOUS
  Filled 2012-06-16 (×19): qty 3

## 2012-06-16 MED ORDER — ACETAMINOPHEN 650 MG RE SUPP: 650.0000 mg | Freq: Four times a day (QID) | RECTAL | Status: DC | PRN

## 2012-06-16 MED ORDER — OXYCODONE HCL 5 MG/5ML PO SOLN: 5.0000 mg | Freq: Once | ORAL | Status: DC | PRN

## 2012-06-16 MED ORDER — OXYCODONE HCL 5 MG PO TABS: 5.0000 mg | ORAL_TABLET | Freq: Once | ORAL | Status: DC | PRN

## 2012-06-16 MED ORDER — DEXAMETHASONE SODIUM PHOSPHATE 4 MG/ML IJ SOLN
INTRAMUSCULAR | Status: DC | PRN
  Administered 2012-06-16: 4 mg via INTRAVENOUS

## 2012-06-16 MED ORDER — HYDROMORPHONE HCL PF 1 MG/ML IJ SOLN
INTRAMUSCULAR | Status: AC
  Administered 2012-06-16: 22:00:00
  Filled 2012-06-16: qty 1

## 2012-06-16 MED ORDER — GLYCOPYRROLATE 0.2 MG/ML IJ SOLN
INTRAMUSCULAR | Status: DC | PRN
  Administered 2012-06-16 (×2): 0.2 mg via INTRAVENOUS

## 2012-06-16 MED ORDER — ACETAMINOPHEN 325 MG PO TABS: 650.0000 mg | ORAL_TABLET | Freq: Four times a day (QID) | ORAL | Status: DC | PRN

## 2012-06-16 MED ORDER — 0.9 % SODIUM CHLORIDE (POUR BTL) OPTIME
TOPICAL | Status: DC | PRN
  Administered 2012-06-16: 3000 mL

## 2012-06-16 SURGICAL SUPPLY — 48 items
BLADE SURG ROTATE 9660 (MISCELLANEOUS) IMPLANT
CANISTER SUCTION 2500CC (MISCELLANEOUS) ×1 IMPLANT
CHLORAPREP W/TINT 26ML (MISCELLANEOUS) ×2 IMPLANT
CLOTH BEACON ORANGE TIMEOUT ST (SAFETY) ×2 IMPLANT
COVER MAYO STAND STRL (DRAPES) IMPLANT
COVER SURGICAL LIGHT HANDLE (MISCELLANEOUS) ×2 IMPLANT
DRAIN CHANNEL 19F RND (DRAIN) ×1 IMPLANT
DRAPE LAPAROSCOPIC ABDOMINAL (DRAPES) ×2 IMPLANT
DRAPE UTILITY 15X26 W/TAPE STR (DRAPE) ×4 IMPLANT
DRAPE WARM FLUID 44X44 (DRAPE) ×2 IMPLANT
DRSG MEPILEX BORDER 4X8 (GAUZE/BANDAGES/DRESSINGS) ×1 IMPLANT
ELECT BLADE 6.5 EXT (BLADE) IMPLANT
ELECT CAUTERY BLADE 6.4 (BLADE) ×3 IMPLANT
ELECT REM PT RETURN 9FT ADLT (ELECTROSURGICAL) ×2
ELECTRODE REM PT RTRN 9FT ADLT (ELECTROSURGICAL) ×1 IMPLANT
EVACUATOR SILICONE 100CC (DRAIN) ×1 IMPLANT
GLOVE BIO SURGEON STRL SZ7 (GLOVE) ×3 IMPLANT
GLOVE BIOGEL PI IND STRL 7.5 (GLOVE) ×1 IMPLANT
GLOVE BIOGEL PI INDICATOR 7.5 (GLOVE) ×2
GOWN STRL NON-REIN LRG LVL3 (GOWN DISPOSABLE) ×4 IMPLANT
KIT BASIN OR (CUSTOM PROCEDURE TRAY) ×2 IMPLANT
KIT ROOM TURNOVER OR (KITS) ×2 IMPLANT
LIGASURE IMPACT 36 18CM CVD LR (INSTRUMENTS) IMPLANT
NS IRRIG 1000ML POUR BTL (IV SOLUTION) ×5 IMPLANT
PACK GENERAL/GYN (CUSTOM PROCEDURE TRAY) ×2 IMPLANT
PAD ARMBOARD 7.5X6 YLW CONV (MISCELLANEOUS) ×2 IMPLANT
PAD SHARPS MAGNETIC DISPOSAL (MISCELLANEOUS) ×1 IMPLANT
SPECIMEN JAR X LARGE (MISCELLANEOUS) IMPLANT
SPONGE GAUZE 4X4 12PLY (GAUZE/BANDAGES/DRESSINGS) ×1 IMPLANT
SPONGE LAP 18X18 X RAY DECT (DISPOSABLE) IMPLANT
STAPLER VISISTAT 35W (STAPLE) ×2 IMPLANT
SUCTION POOLE TIP (SUCTIONS) ×2 IMPLANT
SUT ETHILON 2 0 FS 18 (SUTURE) ×1 IMPLANT
SUT PDS AB 1 TP1 96 (SUTURE) ×4 IMPLANT
SUT SILK 2 0 (SUTURE) ×2
SUT SILK 2 0 SH CR/8 (SUTURE) ×2 IMPLANT
SUT SILK 2-0 18XBRD TIE 12 (SUTURE) ×1 IMPLANT
SUT SILK 3 0 (SUTURE) ×2
SUT SILK 3 0 SH CR/8 (SUTURE) ×2 IMPLANT
SUT SILK 3-0 18XBRD TIE 12 (SUTURE) ×1 IMPLANT
SUT VIC AB 3-0 SH 27 (SUTURE)
SUT VIC AB 3-0 SH 27X BRD (SUTURE) IMPLANT
SYR BULB IRRIGATION 50ML (SYRINGE) IMPLANT
TAPE CLOTH SURG 4X10 WHT LF (GAUZE/BANDAGES/DRESSINGS) ×1 IMPLANT
TOWEL OR 17X26 10 PK STRL BLUE (TOWEL DISPOSABLE) ×2 IMPLANT
TRAY FOLEY CATH 14FRSI W/METER (CATHETERS) ×1 IMPLANT
WATER STERILE IRR 1000ML POUR (IV SOLUTION) IMPLANT
YANKAUER SUCT BULB TIP NO VENT (SUCTIONS) ×1 IMPLANT

## 2012-06-16 NOTE — Anesthesia Postprocedure Evaluation (Signed)
  Anesthesia Post-op Note  Patient: [name]  Procedure(s) Performed: Procedure(s) with comments: EXPLORATORY LAPAROTOMY (N/A) - Repair of duodenal ulcer REPAIR OF PERFORATED ULCER (N/A) - duodenal  Patient Location: PACU  Anesthesia Type:General  Level of Consciousness: awake, alert  and oriented  Airway and Oxygen Therapy: Patient Spontanous Breathing and Patient connected to nasal cannula oxygen  Post-op Pain: moderate  Post-op Assessment: Post-op Vital signs reviewed  Post-op Vital Signs: Reviewed  Complications: No apparent anesthesia complications

## 2012-06-16 NOTE — ED Notes (Signed)
Pt transported to CT ?

## 2012-06-16 NOTE — Anesthesia Preprocedure Evaluation (Signed)
Anesthesia Evaluation  Patient identified by MRN, date of birth, ID band Patient awake    Reviewed: Allergy & Precautions, H&P , NPO status , Patient's Chart, lab work & pertinent test results  Airway Mallampati: I TM Distance: >3 FB Neck ROM: Full    Dental  (+) Teeth Intact and Dental Advisory Given   Pulmonary  breath sounds clear to auscultation        Cardiovascular Rhythm:Regular Rate:Normal     Neuro/Psych    GI/Hepatic   Endo/Other    Renal/GU      Musculoskeletal   Abdominal   Peds  Hematology   Anesthesia Other Findings   Reproductive/Obstetrics                           Anesthesia Physical Anesthesia Plan  ASA: II and emergent  Anesthesia Plan: General   Post-op Pain Management:    Induction: Intravenous  Airway Management Planned:   Additional Equipment:   Intra-op Plan:   Post-operative Plan: Extubation in OR  Informed Consent: I have reviewed the patients History and Physical, chart, labs and discussed the procedure including the risks, benefits and alternatives for the proposed anesthesia with the patient or authorized representative who has indicated his/her understanding and acceptance.   Dental advisory given  Plan Discussed with: CRNA, Anesthesiologist and Surgeon  Anesthesia Plan Comments:         Anesthesia Quick Evaluation

## 2012-06-16 NOTE — ED Notes (Signed)
Per EMS pt had a baby on 2nd was in the hospital til the 8th for infection. Pt saw primary care physician yesterday and was given mobic and percocet but has had no relief. Pt was found on the side of the rd laying on the grass by her car b/c she was having so much pain stated she could no longer drive anymore. Pt states she her pain has been getting progressively worse 126/92, 76, 100% RA, 16 RR.

## 2012-06-16 NOTE — Transfer of Care (Signed)
Immediate Anesthesia Transfer of Care Note  Patient: [name]  Procedure(s) Performed: Procedure(s) with comments: EXPLORATORY LAPAROTOMY (N/A) - Repair of duodenal ulcer REPAIR OF PERFORATED ULCER (N/A) - duodenal  Patient Location: PACU  Anesthesia Type:General  Level of Consciousness: oriented, sedated, patient cooperative and responds to stimulation  Airway & Oxygen Therapy: Patient Spontanous Breathing and Patient connected to nasal cannula oxygen  Post-op Assessment: Report given to PACU RN, Post -op Vital signs reviewed and stable and Patient moving all extremities X 4  Post vital signs: Reviewed and stable  Complications: No apparent anesthesia complications

## 2012-06-16 NOTE — Op Note (Signed)
Preoperative diagnosis: Pneumoperitoneum Postoperative diagnosis: Perforated duodenal ulcer Procedure: Exploratory laparotomy with Cheree Ditto patch of perforated duodenal ulcer Surgeon: Dr. Dwain Sarna Anesthesia: Gen. Endotracheal Estimated blood loss: Minimal Specimens: None Drains: 19 French Blake drain around Gonzales patch Complications: None Sponge and needle count was correct x2 at end of operation Disposition to recovery in stable condition  Indications: This is a 22 year old female who is one month status post a spontaneous vaginal delivery of her son. She has had epigastric pain for the last week with acute onset of worsening pain today. Evaluation in the emergency room showed free air on plain film. She undergone a CT scan as well which showed what appeared to be a perforated ulcer. I saw her and discussed a patch of what appeared to be a duodenal ulcer.  Procedure: After informed as well as obtained the patient was taken to the operating room. She was administered cefoxitin. Sequential compression devices were her legs. She was placed under general endotracheal anesthesia without complication. Her abdomen was then prepped and draped in the standard sterile surgical fashion. A Foley catheter and eventually nasogastric tube were both placed. A surgical timeout was performed.  I made an upper midline incision carried this through into her peritoneum without difficulty. Upon entering she clearly had some localized bilious fluid in her right upper quadrant near her duodenum. I explored her abdomen and then I noted that she had about a 3 mm anterior perforation of her duodenum with some surrounding thickened tissue. I closed a  portion of the ulcer with 2-0 silk and then I brought a piece of omentum and laid this overlying this as well. I secured this with 2-0 silk  and this completely patched the perforation. Nasogastric tube placement was confirmed. I then placed a 36 [place] Blake drain right next to  my repair. I secured this with a 2-0 nylon. I then closed this with #1 loop PDS. I irrigated the incision and then stapled this closed. A sterile dressing was placed. She tolerated as well as extubated and transferred to recovery stable.

## 2012-06-16 NOTE — ED Notes (Signed)
Patient transported to X-ray 

## 2012-06-16 NOTE — ED Provider Notes (Addendum)
History     CSN: [id number]  Arrival date & time 06/16/12  1517   First MD Initiated Contact with Patient 06/16/12 1527      Chief Complaint  Patient presents with  . Abdominal Pain    (Consider location/radiation/quality/duration/timing/severity/associated sxs/prior treatment) The history is provided by the patient.  [name] is a 22 y.o. female diffuse abdominal pain. She had a spontaneous vaginal delivery February 2. It was a very retracted labor and she had a placental infection that was treated. For the last to 3 days she's been having worsening lower abdominal pain as well as right upper quadrant pain. The pain is constant and severe enough that she can no longer drives. Went to see her OB doctor and was prescribed Mobic and Percocet yesterday. He has not been improved but denies any nausea vomiting or fevers. No urinary symptoms.   Past Medical History  Diagnosis Date  . Hypoglycemia   . Ovarian cyst 2011  . Constipation   . Sickle cell trait     Past Surgical History  Procedure Laterality Date  . Laparoscopic ovarian cystectomy  2011  . Eye surgery      2004    Family History  Problem Relation Age of Onset  . Anesthesia problems Neg Hx   . Hearing loss Neg Hx   . Cancer Paternal Grandmother     breast    History  Substance Use Topics  . Smoking status: Never Smoker   . Smokeless tobacco: Never Used  . Alcohol Use: No    OB History   Grav Para Term Preterm Abortions TAB SAB Ect Mult Living   1 1 1  0 0 0 0 0 0 1      Review of Systems  Gastrointestinal: Positive for abdominal pain.  All other systems reviewed and are negative.    Allergies  Review of patient's allergies indicates no known allergies.  Home Medications   Current Outpatient Rx  Name  Route  Sig  Dispense  Refill  . meloxicam (MOBIC) 7.5 MG tablet   Oral   Take 7.5 mg by mouth 2 (two) times daily as needed for pain.         Marland Kitchen oxyCODONE-acetaminophen (PERCOCET)  5-325 MG per tablet   Oral   Take 2 tablets by mouth every 6 (six) hours as needed for pain.   30 tablet   0     SpO2 100%  Breastfeeding? No  Physical Exam  Nursing note and vitals reviewed. Constitutional: She is oriented to person, place, and time.  Uncomfortable, crying in pain.   HENT:  Head: Normocephalic.  Mouth/Throat: Oropharynx is clear and moist.  Eyes: Conjunctivae are normal. Pupils are equal, round, and reactive to light.  Neck: Normal range of motion. Neck supple.  Cardiovascular: Normal rate, regular rhythm and normal heart sounds.   Pulmonary/Chest: Effort normal and breath sounds normal. No respiratory distress. She has no wheezes. She has no rales.  Abdominal:  Soft, + diffuse tenderness, worse in RUQ, ? Murphy's sign.   Musculoskeletal: Normal range of motion.  Neurological: She is alert and oriented to person, place, and time.  Skin: Skin is warm and dry.  Psychiatric: She has a normal mood and affect. Her behavior is normal. Judgment and thought content normal.    ED Course  Procedures (including critical care time)  CRITICAL CARE Performed by: Silverio Lay, DAVID   Total critical care time: 30 min   Critical care time was exclusive of  separately billable procedures and treating other patients.  Critical care was necessary to treat or prevent imminent or life-threatening deterioration.  Critical care was time spent personally by me on the following activities: development of treatment plan with patient and/or surrogate as well as nursing, discussions with consultants, evaluation of patient's response to treatment, examination of patient, obtaining history from patient or surrogate, ordering and performing treatments and interventions, ordering and review of laboratory studies, ordering and review of radiographic studies, pulse oximetry and re-evaluation of patient's condition.   Labs Reviewed  CBC WITH DIFFERENTIAL - Abnormal; Notable for the following:     MCV 75.5 (*)    MCH 25.7 (*)    RDW 16.3 (*)    Platelets 453 (*)    Eosinophils Relative 8 (*)    All other components within normal limits  COMPREHENSIVE METABOLIC PANEL - Abnormal; Notable for the following:    Glucose, Bld 67 (*)    Creatinine, Ser 1.15 (*)    GFR calc non Af Amer 68 (*)    GFR calc Af Amer 79 (*)    All other components within normal limits  URINALYSIS, ROUTINE W REFLEX MICROSCOPIC - Abnormal; Notable for the following:    APPearance HAZY (*)    Hgb urine dipstick MODERATE (*)    Leukocytes, UA LARGE (*)    All other components within normal limits  URINE MICROSCOPIC-ADD ON - Abnormal; Notable for the following:    Squamous Epithelial / LPF FEW (*)    Bacteria, UA FEW (*)    All other components within normal limits  URINE CULTURE  LIPASE, BLOOD   Ct Abdomen Pelvis W Contrast  06/16/2012  *RADIOLOGY REPORT*  Clinical Data: Abdominal pain and free air noted on acute abdominal series.  CT ABDOMEN AND PELVIS WITH CONTRAST  Technique:  Multidetector CT imaging of the abdomen and pelvis was performed following the standard protocol during bolus administration of intravenous contrast.  Contrast: OMNIPAQUE IOHEXOL 300 MG/ML  SOLN  Comparison: CT scan 06/05/2012  Findings: The lung bases are clear.  There is moderate free intraperitoneal air noted.  This appears to surround the stomach which is abnormal in appearance.  Suspect perforated gastric or duodenal ulcer.  No oral contrast was given which limits evaluation.  The small bowel and colon are grossly normal without oral contrast. No mesenteric or retroperitoneal mass or adenopathy.  The aorta is normal in caliber and the major branch vessels are normal.  The solid abdominal organs are unremarkable except for a right renal cyst and left-sided pyelonephritis.  The gallbladder is normal.  There is fluid around the gallbladder and fluid in the right pericolic gutter.  The uterus is enlarged but appears normal given recent  childbirth. There is moderate free pelvic fluid.  The ovaries appear normal. The bladder is normal.  IMPRESSION:  1.  Free interperitoneal air most likely due to gastric or duodenal ulcer perforation. 2.  Left-sided pyelonephritis   Original Report Authenticated By: Rudie Meyer, M.D.    Dg Abd Acute W/chest  06/16/2012  *RADIOLOGY REPORT*  Clinical Data: Abdominal pain.  ACUTE ABDOMEN SERIES (ABDOMEN 2 VIEW & CHEST 1 VIEW)  Comparison: CT scan 06/05/2012.  Findings: There is free air under the right hemidiaphragm.  This suggests a bowel perforation.  The lungs are clear.  The abdominal bowel gas pattern is unremarkable.  The soft tissue shadows are grossly maintained.  No worrisome calcifications.  Density is noted near the umbilicus likely due to jewelery.  IMPRESSION:  1.  No acute pulmonary findings. 2.  Air under right hemidiaphragm suggesting a bowel perforation. Recommend CT scan. 3.  Unremarkable bowel gas pattern.   Original Report Authenticated By: Rudie Meyer, M.D.      1. Acute gastric ulcer with perforation       MDM  Annahi A Rolena [name] is a 22 y.o. female here with ab pain s/p vaginal delivery. Given RUQ tenderness will r/o chole vs HEELP syndrome vs SBO. Will also need to r/o retained products.   6pm  Xray showed free air. I called Dr. Dwain Sarna who will evaluate the patient. CT ab/pel ordered.   7 PM CT showed perforated ulcer. Dr. Dwain Sarna present in the ED. Will take patient to the OR.         Richardean Canal, MD 06/16/12 1932  Richardean Canal, MD 06/16/12 [phone]

## 2012-06-16 NOTE — ED Notes (Signed)
Per pt's mother pt had a baby on the 2nd of Feb, pt was in labor 36hrs after her water broke before she delivered and as a result had an infection. Pt was treated and released on Feb 8th, but pt has been having severe abd pain ever since. Pt went to pcp yesterday for problem states she was given mobic and percocet and sent home. Pain has not improved so she came to the ER.

## 2012-06-16 NOTE — ED Notes (Signed)
Pt returned from xray

## 2012-06-16 NOTE — H&P (Signed)
[name] is an 22 y.o. female.   Chief Complaint: ab pain HPI: 65 yof who had SVD one month ago that was fairly uncomplicated.  A lot of pain afterwards and has been on motrin, mobic and percocet fairly regularly.  Her son is in NICU but is due to be released Sunday.  She has had epigastric pain for about a week and has not really eaten much.  She has been taking fluids.  Today this became acutely worse causing her to have to pull over. She is unable to breath deeply and speaks in short sentences due to pain.  Nothing is making it better. She points to epigastrium when asked where it hurts the most.  Movement exacerbates this significantly.  Past Medical History  Diagnosis Date  . Hypoglycemia   . Ovarian cyst 2011  . Constipation   . Sickle cell trait     Past Surgical History  Procedure Laterality Date  . Laparoscopic ovarian cystectomy  2011  . Eye surgery      20 04    Family History  Problem Relation Age of Onset  . Anesthesia problems Neg Hx   . Hearing loss Neg Hx   . Cancer Paternal Grandmother     breast   Social History:  reports that she has never smoked. She has never used smokeless tobacco. She reports that she does not drink alcohol or use illicit drugs.  Allergies: No Known Allergies  Meds mobic, percocet, zofran  Results for orders placed during the hospital encounter of 06/16/12 (from the past 48 hour(s))  CBC WITH DIFFERENTIAL     Status: Abnormal   Collection Time    06/16/12  3:44 PM      Result Value Range   WBC 8.1  4.0 - 10.5 K/uL   RBC 4.82  3.87 - 5.11 MIL/uL   Hemoglobin 12.4  12.0 - 15.0 g/dL   HCT 40.9  81.1 - 91.4 %   MCV 75.5 (*) 78.0 - 100.0 fL   MCH 25.7 (*) 26.0 - 34.0 pg   MCHC 34.1  30.0 - 36.0 g/dL   RDW 78.2 (*) 95.6 - 21.3 %   Platelets 453 (*) 150 - 400 K/uL   Neutrophils Relative 62  43 - 77 %   Neutro Abs 5.0  1.7 - 7.7 K/uL   Lymphocytes Relative 24  12 - 46 %   Lymphs Abs 1.9  0.7 - 4.0 K/uL   Monocytes Relative 6   3 - 12 %   Monocytes Absolute 0.5  0.1 - 1.0 K/uL   Eosinophils Relative 8 (*) 0 - 5 %   Eosinophils Absolute 0.6  0.0 - 0.7 K/uL   Basophils Relative 0  0 - 1 %   Basophils Absolute 0.0  0.0 - 0.1 K/uL  COMPREHENSIVE METABOLIC PANEL     Status: Abnormal   Collection Time    06/16/12  3:44 PM      Result Value Range   Sodium 140  135 - 145 mEq/L   Potassium 4.5  3.5 - 5.1 mEq/L   Chloride 105  96 - 112 mEq/L   CO2 26  19 - 32 mEq/L   Glucose, Bld 67 (*) 70 - 99 mg/dL   BUN 10  6 - 23 mg/dL   Creatinine, Ser 0.86 (*) 0.50 - 1.10 mg/dL   Calcium 9.8  8.4 - 57.8 mg/dL   Total Protein 7.1  6.0 - 8.3 g/dL   Albumin 3.6  3.5 -  5.2 g/dL   AST 22  0 - 37 U/L   ALT 19  0 - 35 U/L   Alkaline Phosphatase 94  39 - 117 U/L   Total Bilirubin 0.4  0.3 - 1.2 mg/dL   GFR calc non Af Amer 68 (*) >90 mL/min   GFR calc Af Amer 79 (*) >90 mL/min   Comment:            The eGFR has been calculated     using the CKD EPI equation.     This calculation has not been     validated in all clinical     situations.     eGFR's persistently     <90 mL/min signify     possible Chronic Kidney Disease.  LIPASE, BLOOD     Status: None   Collection Time    06/16/12  3:44 PM      Result Value Range   Lipase 38  11 - 59 U/L  URINALYSIS, ROUTINE W REFLEX MICROSCOPIC     Status: Abnormal   Collection Time    06/16/12  7:00 PM      Result Value Range   Color, Urine YELLOW  YELLOW   APPearance HAZY (*) CLEAR   Specific Gravity, Urine 1.017  1.005 - 1.030   pH 6.0  5.0 - 8.0   Glucose, UA NEGATIVE  NEGATIVE mg/dL   Hgb urine dipstick MODERATE (*) NEGATIVE   Bilirubin Urine NEGATIVE  NEGATIVE   Ketones, ur NEGATIVE  NEGATIVE mg/dL   Protein, ur NEGATIVE  NEGATIVE mg/dL   Urobilinogen, UA 0.2  0.0 - 1.0 mg/dL   Nitrite NEGATIVE  NEGATIVE   Leukocytes, UA LARGE (*) NEGATIVE  URINE MICROSCOPIC-ADD ON     Status: Abnormal   Collection Time    06/16/12  7:00 PM      Result Value Range   Squamous Epithelial  / LPF FEW (*) RARE   WBC, UA 21-50  <3 WBC/hpf   RBC / HPF 0-2  <3 RBC/hpf   Bacteria, UA FEW (*) RARE   Ct Abdomen Pelvis W Contrast  06/16/2012  *RADIOLOGY REPORT*  Clinical Data: Abdominal pain and free air noted on acute abdominal series.  CT ABDOMEN AND PELVIS WITH CONTRAST  Technique:  Multidetector CT imaging of the abdomen and pelvis was performed following the standard protocol during bolus administration of intravenous contrast.  Contrast: OMNIPAQUE IOHEXOL 300 MG/ML  SOLN  Comparison: CT scan 06/05/2012  Findings: The lung bases are clear.  There is moderate free intraperitoneal air noted.  This appears to surround the stomach which is abnormal in appearance.  Suspect perforated gastric or duodenal ulcer.  No oral contrast was given which limits evaluation.  The small bowel and colon are grossly normal without oral contrast. No mesenteric or retroperitoneal mass or adenopathy.  The aorta is normal in caliber and the major branch vessels are normal.  The solid abdominal organs are unremarkable except for a right renal cyst and left-sided pyelonephritis.  The gallbladder is normal.  There is fluid around the gallbladder and fluid in the right pericolic gutter.  The uterus is enlarged but appears normal given recent childbirth. There is moderate free pelvic fluid.  The ovaries appear normal. The bladder is normal.  IMPRESSION:  1.  Free interperitoneal air most likely due to gastric or duodenal ulcer perforation. 2.  Left-sided pyelonephritis   Original Report Authenticated By: Rudie Meyer, M.D.    Dg Abd Acute W/chest  06/16/2012  *RADIOLOGY REPORT*  Clinical Data: Abdominal pain.  ACUTE ABDOMEN SERIES (ABDOMEN 2 VIEW & CHEST 1 VIEW)  Comparison: CT scan 06/05/2012.  Findings: There is free air under the right hemidiaphragm.  This suggests a bowel perforation.  The lungs are clear.  The abdominal bowel gas pattern is unremarkable.  The soft tissue shadows are grossly maintained.  No  worrisome calcifications.  Density is noted near the umbilicus likely due to jewelery.  IMPRESSION:  1.  No acute pulmonary findings. 2.  Air under right hemidiaphragm suggesting a bowel perforation. Recommend CT scan. 3.  Unremarkable bowel gas pattern.   Original Report Authenticated By: Rudie Meyer, M.D.     Review of Systems  Constitutional: Negative for fever and chills.  Respiratory: Positive for shortness of breath (due to ab pain). Negative for cough.   Cardiovascular: Negative for chest pain and leg swelling.  Gastrointestinal: Positive for abdominal pain. Negative for nausea and vomiting.    SpO2 100.00%, not currently breastfeeding. Physical Exam  Vitals reviewed. Constitutional: She appears well-developed and well-nourished.  Eyes: No scleral icterus.  Neck: Neck supple.  Cardiovascular: Normal rate, regular rhythm and normal heart sounds.   Respiratory: Effort normal and breath sounds normal. She has no wheezes. She has no rales.  GI: Soft. Bowel sounds are normal. She exhibits no distension. There is tenderness in the epigastric area. There is rebound. No hernia.  Lymphadenopathy:    She has no cervical adenopathy.  Skin: She is diaphoretic.     Assessment/Plan Pneumoperitoneum, likely perforated ulcer  We discussed likely pathology and need for operation. I discussed patch repair of ulcer or treatment of other condition if that is source.  Risks discussed as well as postoperative course and hospital stay.   Yarden Manuelito 06/16/2012, 7:29 PM

## 2012-06-17 LAB — BASIC METABOLIC PANEL
BUN: 8 mg/dL (ref 6–23)
Chloride: 107 mEq/L (ref 96–112)
Glucose, Bld: 122 mg/dL — ABNORMAL HIGH (ref 70–99)
Potassium: 4.5 mEq/L (ref 3.5–5.1)

## 2012-06-17 LAB — CBC
HCT: 32.4 % — ABNORMAL LOW (ref 36.0–46.0)
Hemoglobin: 11 g/dL — ABNORMAL LOW (ref 12.0–15.0)
MCH: 25.3 pg — ABNORMAL LOW (ref 26.0–34.0)
MCHC: 34 g/dL (ref 30.0–36.0)
MCV: 74.7 fL — ABNORMAL LOW (ref 78.0–100.0)

## 2012-06-17 MED ORDER — ONDANSETRON HCL 4 MG/2ML IJ SOLN: 4.0000 mg | Freq: Four times a day (QID) | INTRAMUSCULAR | Status: DC | PRN

## 2012-06-17 MED ORDER — HYDROMORPHONE 0.3 MG/ML IV SOLN
INTRAVENOUS | Status: DC
  Administered 2012-06-17: 18:00:00 via INTRAVENOUS
  Administered 2012-06-17: 4.5 mg via INTRAVENOUS
  Administered 2012-06-17: 2.3 mg via INTRAVENOUS
  Administered 2012-06-17: 3.3 mg via INTRAVENOUS
  Administered 2012-06-17: 3.1 mg via INTRAVENOUS
  Administered 2012-06-18: 7.61 mg via INTRAVENOUS
  Administered 2012-06-18: 06:00:00 via INTRAVENOUS
  Administered 2012-06-18: 0.9 mg via INTRAVENOUS
  Administered 2012-06-18: 2.8 mg via INTRAVENOUS
  Administered 2012-06-19: 03:00:00 via INTRAVENOUS
  Administered 2012-06-19: 4.34 mg via INTRAVENOUS
  Administered 2012-06-19: 3 mg via INTRAVENOUS
  Filled 2012-06-17 (×5): qty 25

## 2012-06-17 MED ORDER — NALOXONE HCL 0.4 MG/ML IJ SOLN: 0.4000 mg | INTRAMUSCULAR | Status: DC | PRN

## 2012-06-17 MED ORDER — BIOTENE DRY MOUTH MT LIQD: 15.0000 mL | OROMUCOSAL | Status: DC | PRN

## 2012-06-17 MED ORDER — BIOTENE DRY MOUTH MT LIQD
15.0000 mL | Freq: Two times a day (BID) | OROMUCOSAL | Status: DC
  Administered 2012-06-18 – 2012-06-20 (×4): 15 mL via OROMUCOSAL

## 2012-06-17 MED ORDER — PROMETHAZINE HCL 25 MG/ML IJ SOLN
25.0000 mg | Freq: Four times a day (QID) | INTRAMUSCULAR | Status: AC | PRN
  Administered 2012-06-18: 25 mg via INTRAVENOUS
  Filled 2012-06-17: qty 1

## 2012-06-17 MED ORDER — DIPHENHYDRAMINE HCL 50 MG/ML IJ SOLN
12.5000 mg | Freq: Four times a day (QID) | INTRAMUSCULAR | Status: DC | PRN
  Administered 2012-06-17 – 2012-06-18 (×4): 12.5 mg via INTRAVENOUS
  Filled 2012-06-17 (×5): qty 1

## 2012-06-17 MED ORDER — DIPHENHYDRAMINE HCL 50 MG/ML IJ SOLN
12.5000 mg | Freq: Once | INTRAMUSCULAR | Status: AC
  Administered 2012-06-17: 12.5 mg via INTRAVENOUS
  Filled 2012-06-17: qty 1

## 2012-06-17 MED ORDER — DIPHENHYDRAMINE HCL 12.5 MG/5ML PO ELIX
12.5000 mg | ORAL_SOLUTION | Freq: Four times a day (QID) | ORAL | Status: DC | PRN
  Filled 2012-06-17: qty 5

## 2012-06-17 MED ORDER — SODIUM CHLORIDE 0.9 % IJ SOLN: 9.0000 mL | INTRAMUSCULAR | Status: DC | PRN

## 2012-06-17 NOTE — Progress Notes (Signed)
1 Day Post-Op   Assessment: s/p Procedure(s): EXPLORATORY LAPAROTOMY REPAIR OF PERFORATED ULCER Patient Active Problem List  Diagnosis  . OVARIAN CYST, LEFT  . ECZEMA, ATOPIC DERMATITIS  . Abdominal pain, generalized  . Fever blister  . Seasonal allergies  . New onset of headaches  . Preterm labor  . Endometritis following delivery  . Normal delivery  . Anemia of mother, with delivery    Stable post op  Plan: Encouraged ambulation  Subjective: Feels better than pre-op and wants something to drink  Objective: Vital signs in last 24 hours: Temp:  [97.5 F (36.4 C)-99.7 F (37.6 C)] 98.9 F (37.2 C) (02/22 0712) Pulse Rate:  [75-79] 75 (02/22 0712) Resp:  [11-18] 11 (02/22 0902) BP: (137-156)/(86-99) 146/89 mmHg (02/22 0712) SpO2:  [96 %-100 %] 97 % (02/22 0902) Weight:  [137 lb 9.1 oz (62.4 kg)-156 lb (70.761 kg)] 137 lb 9.1 oz (62.4 kg) (02/21 2309)   Intake/Output from previous day: 02/21 0701 - 02/22 0700 In: 2361 [I.V.:2361] Out: 740 [Urine:650; Emesis/NG output:60; Drains:30] Intake/Output this shift:     General appearance: alert, cooperative and mild distress Resp: clear to auscultation bilaterally GI: Slightly tender  Incision: Dressing dry, JP thin  Lab Results:   Recent Labs  06/16/12 1544 06/17/12 0700  WBC 8.1 15.6*  HGB 12.4 11.0*  HCT 36.4 32.4*  PLT 453* 413*   BMET  Recent Labs  06/16/12 1544 06/17/12 0700  NA 140 140  K 4.5 4.5  CL 105 107  CO2 26 23  GLUCOSE 67* 122*  BUN 10 8  CREATININE 1.15* 1.01  CALCIUM 9.8 9.1   PT/INR No results found for this basename: LABPROT, INR,  in the last 72 hours ABG No results found for this basename: PHART, PCO2, PO2, HCO3,  in the last 72 hours  MEDS, Scheduled . ampicillin-sulbactam (UNASYN) IV  3 g Intravenous Q6H  . heparin  5,000 Units Subcutaneous Q8H  . HYDROmorphone      . HYDROmorphone PCA 0.3 mg/mL   Intravenous Q4H  . pantoprazole (PROTONIX) IV  40 mg Intravenous  Q12H    Studies/Results: Ct Abdomen Pelvis W Contrast  06/16/2012  *RADIOLOGY REPORT*  Clinical Data: Abdominal pain and free air noted on acute abdominal series.  CT ABDOMEN AND PELVIS WITH CONTRAST  Technique:  Multidetector CT imaging of the abdomen and pelvis was performed following the standard protocol during bolus administration of intravenous contrast.  Contrast: OMNIPAQUE IOHEXOL 300 MG/ML  SOLN  Comparison: CT scan 06/05/2012  Findings: The lung bases are clear.  There is moderate free intraperitoneal air noted.  This appears to surround the stomach which is abnormal in appearance.  Suspect perforated gastric or duodenal ulcer.  No oral contrast was given which limits evaluation.  The small bowel and colon are grossly normal without oral contrast. No mesenteric or retroperitoneal mass or adenopathy.  The aorta is normal in caliber and the major branch vessels are normal.  The solid abdominal organs are unremarkable except for a right renal cyst and left-sided pyelonephritis.  The gallbladder is normal.  There is fluid around the gallbladder and fluid in the right pericolic gutter.  The uterus is enlarged but appears normal given recent childbirth. There is moderate free pelvic fluid.  The ovaries appear normal. The bladder is normal.  IMPRESSION:  1.  Free interperitoneal air most likely due to gastric or duodenal ulcer perforation. 2.  Left-sided pyelonephritis   Original Report Authenticated By: Rudie Meyer, M.D.  Dg Abd Acute W/chest  06/16/2012  *RADIOLOGY REPORT*  Clinical Data: Abdominal pain.  ACUTE ABDOMEN SERIES (ABDOMEN 2 VIEW & CHEST 1 VIEW)  Comparison: CT scan 06/05/2012.  Findings: There is free air under the right hemidiaphragm.  This suggests a bowel perforation.  The lungs are clear.  The abdominal bowel gas pattern is unremarkable.  The soft tissue shadows are grossly maintained.  No worrisome calcifications.  Density is noted near the umbilicus likely due to jewelery.   IMPRESSION:  1.  No acute pulmonary findings. 2.  Air under right hemidiaphragm suggesting a bowel perforation. Recommend CT scan. 3.  Unremarkable bowel gas pattern.   Original Report Authenticated By: Rudie Meyer, M.D.       LOS: 1 day     Currie Paris, MD, [facility], [place] [phone]   06/17/2012 11:30 AM

## 2012-06-18 DIAGNOSIS — K265 Chronic or unspecified duodenal ulcer with perforation: Secondary | ICD-10-CM | POA: Diagnosis present

## 2012-06-18 LAB — URINE CULTURE: Colony Count: 50000

## 2012-06-18 NOTE — Progress Notes (Signed)
2 Days Post-Op   Assessment: s/p Procedure(s): EXPLORATORY LAPAROTOMY REPAIR OF PERFORATED ULCER Patient Active Problem List  Diagnosis  . OVARIAN CYST, LEFT  . ECZEMA, ATOPIC DERMATITIS  . Abdominal pain, generalized  . Fever blister  . Seasonal allergies  . New onset of headaches  . Preterm labor  . Endometritis following delivery  . Normal delivery  . Anemia of mother, with delivery  . DU (perforated duodenal ulcer)    Stable post op  Plan: No changes today, leave N/G and keep NPO aoother day  Subjective: Feels better today, no nausea, less pain than yesterday, wants ng out and food  Objective: Vital signs in last 24 hours: Temp:  [97.9 F (36.6 C)-98.9 F (37.2 C)] 98.9 F (37.2 C) (02/23 0618) Pulse Rate:  [84-92] 85 (02/23 0618) Resp:  [10-18] 17 (02/23 0618) BP: (122-156)/(79-98) 122/87 mmHg (02/23 0618) SpO2:  [96 %-100 %] 99 % (02/23 0618) FiO2 (%):  [89 %] 89 % (02/23 0603)   Intake/Output from previous day: 02/22 0701 - 02/23 0700 In: 2326.7 [I.V.:2326.7] Out: 1160 [Emesis/NG output:1140; Drains:20] Intake/Output this shift:     General appearance: alert, cooperative and no distress Resp: clear to auscultation bilaterally Cardio: regular rate and rhythm, S1, S2 normal, no murmur, click, rub or gallop GI: Soft, less tender than yesterday.Occ bs  Incision: no significant drainage  Lab Results:   Recent Labs  06/16/12 1544 06/17/12 0700  WBC 8.1 15.6*  HGB 12.4 11.0*  HCT 36.4 32.4*  PLT 453* 413*   BMET  Recent Labs  06/16/12 1544 06/17/12 0700  NA 140 140  K 4.5 4.5  CL 105 107  CO2 26 23  GLUCOSE 67* 122*  BUN 10 8  CREATININE 1.15* 1.01  CALCIUM 9.8 9.1   PT/INR No results found for this basename: LABPROT, INR,  in the last 72 hours ABG No results found for this basename: PHART, PCO2, PO2, HCO3,  in the last 72 hours  MEDS, Scheduled . ampicillin-sulbactam (UNASYN) IV  3 g Intravenous Q6H  . antiseptic oral rinse   15 mL Mouth Rinse BID  . heparin  5,000 Units Subcutaneous Q8H  . HYDROmorphone PCA 0.3 mg/mL   Intravenous Q4H  . pantoprazole (PROTONIX) IV  40 mg Intravenous Q12H    Studies/Results: Ct Abdomen Pelvis W Contrast  06/16/2012  *RADIOLOGY REPORT*  Clinical Data: Abdominal pain and free air noted on acute abdominal series.  CT ABDOMEN AND PELVIS WITH CONTRAST  Technique:  Multidetector CT imaging of the abdomen and pelvis was performed following the standard protocol during bolus administration of intravenous contrast.  Contrast: OMNIPAQUE IOHEXOL 300 MG/ML  SOLN  Comparison: CT scan 06/05/2012  Findings: The lung bases are clear.  There is moderate free intraperitoneal air noted.  This appears to surround the stomach which is abnormal in appearance.  Suspect perforated gastric or duodenal ulcer.  No oral contrast was given which limits evaluation.  The small bowel and colon are grossly normal without oral contrast. No mesenteric or retroperitoneal mass or adenopathy.  The aorta is normal in caliber and the major branch vessels are normal.  The solid abdominal organs are unremarkable except for a right renal cyst and left-sided pyelonephritis.  The gallbladder is normal.  There is fluid around the gallbladder and fluid in the right pericolic gutter.  The uterus is enlarged but appears normal given recent childbirth. There is moderate free pelvic fluid.  The ovaries appear normal. The bladder is normal.  IMPRESSION:  1.  Free interperitoneal air most likely due to gastric or duodenal ulcer perforation. 2.  Left-sided pyelonephritis   Original Report Authenticated By: Rudie Meyer, M.D.    Dg Abd Acute W/chest  06/16/2012  *RADIOLOGY REPORT*  Clinical Data: Abdominal pain.  ACUTE ABDOMEN SERIES (ABDOMEN 2 VIEW & CHEST 1 VIEW)  Comparison: CT scan 06/05/2012.  Findings: There is free air under the right hemidiaphragm.  This suggests a bowel perforation.  The lungs are clear.  The abdominal bowel gas  pattern is unremarkable.  The soft tissue shadows are grossly maintained.  No worrisome calcifications.  Density is noted near the umbilicus likely due to jewelery.  IMPRESSION:  1.  No acute pulmonary findings. 2.  Air under right hemidiaphragm suggesting a bowel perforation. Recommend CT scan. 3.  Unremarkable bowel gas pattern.   Original Report Authenticated By: Rudie Meyer, M.D.       LOS: 2 days     Currie Paris, MD, [facility] Surgery, [place] [phone]   06/18/2012 9:38 AM

## 2012-06-19 ENCOUNTER — Encounter (HOSPITAL_COMMUNITY): Payer: Self-pay | Admitting: General Surgery

## 2012-06-19 LAB — CBC
MCH: 25.3 pg — ABNORMAL LOW (ref 26.0–34.0)
MCHC: 32.8 g/dL (ref 30.0–36.0)
RDW: 16.5 % — ABNORMAL HIGH (ref 11.5–15.5)

## 2012-06-19 LAB — BASIC METABOLIC PANEL
Calcium: 9.2 mg/dL (ref 8.4–10.5)
GFR calc Af Amer: 89 mL/min — ABNORMAL LOW (ref >90)
GFR calc non Af Amer: 77 mL/min — ABNORMAL LOW (ref >90)
Potassium: 3.8 mEq/L (ref 3.5–5.1)
Sodium: 145 mEq/L (ref 135–145)

## 2012-06-19 MED ORDER — HYDROMORPHONE HCL PF 1 MG/ML IJ SOLN
0.5000 mg | INTRAMUSCULAR | Status: DC | PRN
  Administered 2012-06-19 – 2012-06-20 (×3): 1 mg via INTRAVENOUS
  Filled 2012-06-19 (×3): qty 1

## 2012-06-19 NOTE — Progress Notes (Signed)
Patient's NG tube clamped at 11:30, no complaints of nausea and vomiting after 2 hrs gave her clear drink, still with no N&V, a total of more than 4 hrs, removed NGT and started patient on clear liquid diet, will continue to monitor.

## 2012-06-19 NOTE — Progress Notes (Signed)
3 Days Post-Op  Subjective: Pt feels good today, notes minimal abdominal pain, no N/V.  No subjective flatus, no BM yet.  Urinating okay.  Ambulating some.  Using IS.     Objective: Vital signs in last 24 hours: Temp:  [97.9 F (36.6 C)-99.1 F (37.3 C)] 97.9 F (36.6 C) (02/24 1021) Pulse Rate:  [79-106] 88 (02/24 1021) Resp:  [12-20] 20 (02/24 1021) BP: (131-145)/(76-103) 140/86 mmHg (02/24 1021) SpO2:  [96 %-100 %] 100 % (02/24 1021) Last BM Date: 06/16/12  Intake/Output from previous day: 02/23 0701 - 02/24 0700 In: 2113 [I.V.:2113] Out: 2750 [Urine:1125; Emesis/NG output:1550; Drains:75] Intake/Output this shift:    PE: Gen:  Alert, NAD, pleasant Abd: Soft, ND, mild tenderness in epigastrium, few BS, no HSM, incisions C/D/I, drain with minimal sanguinous drainage    Lab Results:   Recent Labs  06/17/12 0700 06/19/12 0700  WBC 15.6* 8.4  HGB 11.0* 10.1*  HCT 32.4* 30.8*  PLT 413* 372   BMET  Recent Labs  06/17/12 0700 06/19/12 0700  NA 140 145  K 4.5 3.8  CL 107 108  CO2 23 22  GLUCOSE 122* 62*  BUN 8 7  CREATININE 1.01 1.04  CALCIUM 9.1 9.2   PT/INR No results found for this basename: LABPROT, INR,  in the last 72 hours CMP     Component Value Date/Time   NA 145 06/19/2012 0700   K 3.8 06/19/2012 0700   CL 108 06/19/2012 0700   CO2 22 06/19/2012 0700   GLUCOSE 62* 06/19/2012 0700   BUN 7 06/19/2012 0700   CREATININE 1.04 06/19/2012 0700   CALCIUM 9.2 06/19/2012 0700   PROT 7.1 06/16/2012 1544   ALBUMIN 3.6 06/16/2012 1544   AST 22 06/16/2012 1544   ALT 19 06/16/2012 1544   ALKPHOS 94 06/16/2012 1544   BILITOT 0.4 06/16/2012 1544   GFRNONAA 77* 06/19/2012 0700   GFRAA 89* 06/19/2012 0700   Lipase     Component Value Date/Time   LIPASE 38 06/16/2012 1544       Studies/Results: No results found.  Anti-infectives: Anti-infectives   Start     Dose/Rate Route Frequency Ordered Stop   06/16/12 2359  Ampicillin-Sulbactam (UNASYN) 3 g in sodium  chloride 0.9 % 100 mL IVPB     3 g 100 mL/hr over 60 Minutes Intravenous Every 6 hours 06/16/12 2315     06/16/12 2000  [MAR Hold]  cefOXitin (MEFOXIN) 2 g in dextrose 5 % 50 mL IVPB     (On MAR Hold since 06/16/12 2023)   2 g 100 mL/hr over 30 Minutes Intravenous  Once 06/16/12 1923 06/16/12 2040       Assessment/Plan POD #3 EX Laparotomy with Cheree Ditto patch of perforated duodenal ulcer 1.  NPO, will clamp NG tube for 2 hours, then start sips for another 2 hours, if tolerates it then NG can be discontinued and she can be started on clear liquid diet 2.  Long discussion with patient about necessity of her to stay here for slow progression of diet and ensure surgery site is healing 3.  IVF, d/c PCA and start PRN dilaudid, antiemetics 4.  Ambulation and IS  ABL Anemia - at 10.1 today, continue to monitor Post-partum - endometritis after did not come to hospital 5 days after ROM, discharged on 06/03/12    LOS: 3 days    DORT, Atiba Kimberlin 06/19/2012, 10:35 AM Pager: [phone]

## 2012-06-19 NOTE — Progress Notes (Signed)
Doing well with clear liquids.  May be able to go home tomorrow.  Marta Lamas. Gae Bon, MD, FACS [phone] [facility] Surgery

## 2012-06-20 MED ORDER — CIPROFLOXACIN HCL 500 MG PO TABS: 500.0000 mg | ORAL_TABLET | Freq: Two times a day (BID) | ORAL | Status: DC

## 2012-06-20 MED ORDER — BLISTEX EX OINT
TOPICAL_OINTMENT | CUTANEOUS | Status: DC | PRN
  Filled 2012-06-20: qty 10

## 2012-06-20 MED ORDER — OMEPRAZOLE 40 MG PO CPDR: 40.0000 mg | DELAYED_RELEASE_CAPSULE | Freq: Every day | ORAL | Status: DC

## 2012-06-20 MED ORDER — HYDROCODONE-ACETAMINOPHEN 5-325 MG PO TABS: 0.5000 | ORAL_TABLET | ORAL | Status: DC | PRN

## 2012-06-20 MED ORDER — METRONIDAZOLE 500 MG PO TABS: 500.0000 mg | ORAL_TABLET | Freq: Three times a day (TID) | ORAL | Status: DC

## 2012-06-20 NOTE — Discharge Summary (Signed)
Physician Discharge Summary  Patient ID: [name] MRN: [id number] DOB/AGE: 07/01/1990 22 y.o.  Admit date: 06/16/2012 Discharge date: 06/20/2012  Admitting Diagnosis: Pneumoperitoneum Epigastric abdominal pain Anorexia S/p SVD 1 mo ago  Discharge Diagnosis Patient Active Problem List   Diagnosis Date Noted  . DU (perforated duodenal ulcer) 06/18/2012  . Normal delivery 06/03/2012  . Anemia of mother, with delivery 06/03/2012  . Endometritis following delivery 06/01/2012  . Preterm labor 03/27/2012  . Seasonal allergies 09/10/2010  . New onset of headaches 09/10/2010  . Fever blister 07/08/2010  . OVARIAN CYST, LEFT 10/29/2008  . Abdominal pain, generalized 05/08/2007  . ECZEMA, ATOPIC DERMATITIS 06/23/2006    Consultants None  Imaging: No results found.  Procedures Dr Dwain Sarna (06/16/12) Exploratory laparotomy with Cheree Ditto patch of perforated duodenal ulcer  Hospital Course:  22 y/o female who presented to [facility] after recently having SVD one month ago that was fairly uncomplicated. A lot of pain afterwards and has been on motrin, mobic and percocet fairly regularly. Her son is in NICU but is due to be released Sunday. She has had epigastric pain for about a week and has not really eaten much. She has been taking fluids. Today this became acutely worse causing her to have to pull over. She is unable to breath deeply and speaks in short sentences due to pain. Nothing is making it better. She points to epigastrium when asked where it hurts the most. Movement exacerbates this significantly.    Workup showed pneumoperitoneum and she was emergently taken to the operating room.  Patient was admitted and underwent an exploratory laparotomy with graham patch secondary to a perforated duodenal ulcer.  She tolerated procedure well and was transferred to the floor.  She was kept NPO and with NG tube until POD #3 her PCA was discontinued and she progressed through NG clamping  trials.  Her NG was removed and she was started on a clear liquid diet.  ON POD #4 she was advanced to fulls.  On POD #4, the patient was voiding well, tolerating diet, ambulating well, pain well controlled, vital signs stable, incisions c/d/i and felt stable for discharge home.  Patient will follow up in our office in 2 weeks for post-op check, and in 7-10 days for staple removal and knows to call with questions or concerns.  She will need to continue antibiotics for 3 more days.      Medication List    STOP taking these medications       meloxicam 7.5 MG tablet  Commonly known as:  MOBIC     oxyCODONE-acetaminophen 5-325 MG per tablet  Commonly known as:  PERCOCET      TAKE these medications       HYDROcodone-acetaminophen 5-325 MG per tablet  Commonly known as:  NORCO/VICODIN  Take 0.5-2 tablets by mouth every 4 (four) hours as needed.     omeprazole 40 MG capsule  Commonly known as:  PRILOSEC  Take 1 capsule (40 mg total) by mouth daily. Take 30-60 minutes before a breakfast.         Follow-up Information   Follow up with [facility], MD. Schedule an appointment as soon as possible for a visit in 2 weeks. (CALL TO VERIFY APPT TIME)    Contact information:   [address] [place] [address] [phone]       Schedule an appointment as soon as possible for a visit with [facility],MD, MD. (FOR STAPLE REMOVE IN 7-10 DAYS)  Contact information:   [address] [place] [address] [phone]       Signed: Candiss Norse [facility] Surgery [phone]  06/20/2012, 11:48 AM

## 2012-06-20 NOTE — Progress Notes (Signed)
4 Days Post-Op  Subjective: Pt feels good this am, denies abdominal pain, N/V, fever/chills.  Pt tolerating liquids well, and ambulating through halls.  Pt wants to go home to be with her newborn.    Objective: Vital signs in last 24 hours: Temp:  [97.9 F (36.6 C)-99.1 F (37.3 C)] 99.1 F (37.3 C) (02/25 0612) Pulse Rate:  [62-88] 70 (02/25 0612) Resp:  [16-20] 16 (02/25 0612) BP: (130-140)/(62-91) 139/91 mmHg (02/25 0612) SpO2:  [98 %-100 %] 99 % (02/25 0612) Last BM Date: 06/16/12  Intake/Output from previous day: 02/24 0701 - 02/25 0700 In: 2597.3 [P.O.:240; I.V.:2357.3] Out: 1805 [Urine:1500; Emesis/NG output:250; Drains:55] Intake/Output this shift:    PE: Gen:  Alert, NAD, pleasant Abd: Soft, NT/ND, +BS, no HSM, incisions C/D/I, drain with minimal sanguinous drainage    Lab Results:   Recent Labs  06/19/12 0700  WBC 8.4  HGB 10.1*  HCT 30.8*  PLT 372   BMET  Recent Labs  06/19/12 0700  NA 145  K 3.8  CL 108  CO2 22  GLUCOSE 62*  BUN 7  CREATININE 1.04  CALCIUM 9.2   PT/INR No results found for this basename: LABPROT, INR,  in the last 72 hours CMP     Component Value Date/Time   NA 145 06/19/2012 0700   K 3.8 06/19/2012 0700   CL 108 06/19/2012 0700   CO2 22 06/19/2012 0700   GLUCOSE 62* 06/19/2012 0700   BUN 7 06/19/2012 0700   CREATININE 1.04 06/19/2012 0700   CALCIUM 9.2 06/19/2012 0700   PROT 7.1 06/16/2012 1544   ALBUMIN 3.6 06/16/2012 1544   AST 22 06/16/2012 1544   ALT 19 06/16/2012 1544   ALKPHOS 94 06/16/2012 1544   BILITOT 0.4 06/16/2012 1544   GFRNONAA 77* 06/19/2012 0700   GFRAA 89* 06/19/2012 0700   Lipase     Component Value Date/Time   LIPASE 38 06/16/2012 1544       Studies/Results: No results found.  Anti-infectives: Anti-infectives   Start     Dose/Rate Route Frequency Ordered Stop   06/16/12 2359  Ampicillin-Sulbactam (UNASYN) 3 g in sodium chloride 0.9 % 100 mL IVPB     3 g 100 mL/hr over 60 Minutes Intravenous  Every 6 hours 06/16/12 2315     06/16/12 2000  [MAR Hold]  cefOXitin (MEFOXIN) 2 g in dextrose 5 % 50 mL IVPB     (On MAR Hold since 06/16/12 2023)   2 g 100 mL/hr over 30 Minutes Intravenous  Once 06/16/12 1923 06/16/12 2040       Assessment/Plan POD #4 EX Laparotomy with Cheree Ditto patch of perforated duodenal ulcer  1. Long discussion about do's and don'ts of ulcer disease 2. If goes home today will need to follow a GERD diet and go home on PPI 3. IVF, encourage oral pain meds, antiemetics  4. Ambulation and IS  5. Can likely go home today after lunch, will need sutures out at 10-14 days and f/u with Dr. Dwain Sarna in 2-3 weeks 6. Drain can likely come out today, since only 81mL/24 hours  ABL Anemia - stable Post-partum - endometritis after did not come to hospital 5 days after ROM, discharged on 06/03/12     LOS: 4 days    DORT, Dresden Ament 06/20/2012, 8:52 AM Pager: [phone]

## 2012-06-20 NOTE — Progress Notes (Signed)
Discharged to home

## 2012-06-21 ENCOUNTER — Telehealth (INDEPENDENT_AMBULATORY_CARE_PROVIDER_SITE_OTHER): Payer: Self-pay

## 2012-06-21 NOTE — Telephone Encounter (Signed)
I tried to call pt but the number she has is incorrect and we need a better number. The pt's father Mr [name] contact number is incorrect so we need better number. I am trying to reach pt to give her an appt for a nurse only on 3//3 arrive at 10:00. The pt also needs an appt to see Dr Dwain Sarna in about 3 wks from surgery so I made her an appt for 07/11/12 arrive at 11:15/11:45. I need for the pt to get these appt's.

## 2012-06-26 ENCOUNTER — Telehealth (INDEPENDENT_AMBULATORY_CARE_PROVIDER_SITE_OTHER): Payer: Self-pay

## 2012-06-26 ENCOUNTER — Encounter (INDEPENDENT_AMBULATORY_CARE_PROVIDER_SITE_OTHER): Payer: Medicaid Other

## 2012-06-26 NOTE — Telephone Encounter (Signed)
The patient called to schedule a one week follow up staple removal from last Friday.

## 2012-06-27 NOTE — Telephone Encounter (Signed)
Returned pt's message from yesterday. I offered an appt to come in today for a nurse only visit to have staples removed but she declined it for today she requested tomorrow. I made her an appt for 3/5 arrive at 10:00. I also notified her of the appt with Dr Dwain Sarna for 3/18 to arrive at 11:15/11:30. The pt understands.

## 2012-06-28 ENCOUNTER — Ambulatory Visit (INDEPENDENT_AMBULATORY_CARE_PROVIDER_SITE_OTHER): Payer: Medicaid Other | Admitting: General Surgery

## 2012-06-28 ENCOUNTER — Encounter (INDEPENDENT_AMBULATORY_CARE_PROVIDER_SITE_OTHER): Payer: Self-pay | Admitting: General Surgery

## 2012-06-28 VITALS — BP 120/84 | HR 93 | Temp 98.4°F | Resp 16 | Ht 63.0 in | Wt 130.6 lb

## 2012-06-28 DIAGNOSIS — Z4802 Encounter for removal of sutures: Secondary | ICD-10-CM

## 2012-06-28 NOTE — Progress Notes (Signed)
Patient came in today s/p exp lap by Dr.Wakefield on 2/21 for staple removal..patient's incision was tender to touch with zero redness so I had Dr.Rosenbower take a quick look at everything to make sure it was okay to proceed with taking the staples out..per verbal orders from Dr.Rosenbower everything looked fine and all staples and a stitch were removed intact..patient handled very well.. benzoin and 1/2 steri-strips were placed over incision...patient has follow up appt on 3-18 to see Dr.Wakefield

## 2012-07-04 ENCOUNTER — Encounter (HOSPITAL_COMMUNITY): Payer: Self-pay | Admitting: *Deleted

## 2012-07-04 ENCOUNTER — Inpatient Hospital Stay (HOSPITAL_COMMUNITY)
Admission: AD | Admit: 2012-07-04 | Discharge: 2012-07-05 | Disposition: A | Discharge status: Home / Self Care | Payer: 59 | Source: Ambulatory Visit | Attending: Obstetrics | Admitting: Obstetrics

## 2012-07-04 DIAGNOSIS — R109 Unspecified abdominal pain: Secondary | ICD-10-CM | POA: Insufficient documentation

## 2012-07-04 DIAGNOSIS — L7682 Other postprocedural complications of skin and subcutaneous tissue: Secondary | ICD-10-CM

## 2012-07-04 DIAGNOSIS — R209 Unspecified disturbances of skin sensation: Secondary | ICD-10-CM

## 2012-07-04 DIAGNOSIS — N943 Premenstrual tension syndrome: Secondary | ICD-10-CM | POA: Insufficient documentation

## 2012-07-04 LAB — CBC
Hemoglobin: 11.6 g/dL — ABNORMAL LOW (ref 12.0–15.0)
MCH: 25.5 pg — ABNORMAL LOW (ref 26.0–34.0)
MCHC: 33.6 g/dL (ref 30.0–36.0)
Platelets: 315 10*3/uL (ref 150–400)

## 2012-07-04 LAB — URINE MICROSCOPIC-ADD ON

## 2012-07-04 LAB — URINALYSIS, ROUTINE W REFLEX MICROSCOPIC
Bilirubin Urine: NEGATIVE
Hgb urine dipstick: NEGATIVE
Nitrite: NEGATIVE
Protein, ur: NEGATIVE mg/dL
Specific Gravity, Urine: 1.025 (ref 1.005–1.030)
Urobilinogen, UA: 0.2 mg/dL (ref 0.0–1.0)

## 2012-07-04 NOTE — MAU Note (Signed)
Pt had abd surgery 2 weeks ago for a perofrtated ulcer-states her pain had gotten better after suregery-but 3 days ago-her pain has gotten worse and worse-she had a vaginal del on Feb 2nd and stayed in the hosp for a week due to an infection in the placenta

## 2012-07-04 NOTE — MAU Provider Note (Signed)
History     CSN: [id number]  Arrival date and time: 07/04/12 2153   None     Chief Complaint  Patient presents with  . Abdominal Pain   HPI [name] is a 22 y.o. G1P1001 who presents to MAU today with lower abdominal discomfort and incisional pain. The patient states that this started 3 days ago. She had endometritis following delivery on 05/29/12 that was adequately treated with IV antibiotics. She had complete resolution of symptoms prior to discharge. The patient also had emergency surgery on 06/16/12 for a perforated duodenal ulcer. She denies fever, N/V today. She has not had a period since the delivery. She has resumed sexual intercourse recently with protection. She has not started birth control yet. She has PP appointment with Dr. [name] on 07/06/12 and plans to discuss IUD for birth control. The patient states that she has had a small bump on the right side of her scar from the abdominal surgery. She states mild increase in pain at the area of the bump. She is scheduled for follow-up with [facility] on 07/11/12.    OB History   Grav Para Term Preterm Abortions TAB SAB Ect Mult Living   1 1 1  0 0 0 0 0 0 1      Past Medical History  Diagnosis Date  . Hypoglycemia   . Ovarian cyst 2011  . Constipation   . Sickle cell trait     Past Surgical History  Procedure Laterality Date  . Laparoscopic ovarian cystectomy  2011  . Eye surgery      2004  . Laparotomy N/A 06/16/2012    Procedure: EXPLORATORY LAPAROTOMY;  Surgeon: Emelia Loron, MD;  Location: [facility] OR;  Service: General;  Laterality: N/A;  Repair of duodenal ulcer  . Repair of perforated ulcer N/A 06/16/2012    Procedure: REPAIR OF PERFORATED ULCER;  Surgeon: Emelia Loron, MD;  Location: [facility] OR;  Service: General;  Laterality: N/A;  duodenal    Family History  Problem Relation Age of Onset  . Anesthesia problems Neg Hx   . Hearing loss Neg Hx   . Cancer Paternal Grandmother     breast     History  Substance Use Topics  . Smoking status: Never Smoker   . Smokeless tobacco: Never Used  . Alcohol Use: No    Allergies:  Allergies  Allergen Reactions  . Nsaids     Diagnosed with a perforated duodenal ulcer due to NSAIDS    Prescriptions prior to admission  Medication Sig Dispense Refill  . HYDROcodone-acetaminophen (NORCO/VICODIN) 5-325 MG per tablet Take 0.5-2 tablets by mouth every 4 (four) hours as needed.  40 tablet  0  . omeprazole (PRILOSEC) 40 MG capsule Take 1 capsule (40 mg total) by mouth daily. Take 30-60 minutes before a breakfast.  60 capsule  1  . oxyCODONE-acetaminophen (PERCOCET/ROXICET) 5-325 MG per tablet Take 1 tablet by mouth every 4 (four) hours as needed for pain.        Review of Systems  Constitutional: Negative for fever, chills and malaise/fatigue.  Gastrointestinal: Positive for abdominal pain. Negative for nausea, vomiting, diarrhea and constipation.  Genitourinary: Negative for dysuria, urgency and frequency.       Neg - vaginal bleeding Neg -vaginal discharge  Musculoskeletal: Negative for back pain.  Neurological: Negative for dizziness and loss of consciousness.   Physical Exam   Blood pressure 135/92, pulse 75, temperature 98.1 F (36.7 C), temperature source Oral, resp. rate 20,  height 5\' 3"  (1.6 m), weight 132 lb (59.875 kg), SpO2 100.00%.  Physical Exam  Constitutional: She is oriented to person, place, and time. She appears well-developed and well-nourished. No distress.  HENT:  Head: Normocephalic and atraumatic.  Cardiovascular: Normal rate, regular rhythm and normal heart sounds.   Respiratory: Effort normal and breath sounds normal. No respiratory distress.  GI: Soft. Bowel sounds are normal. She exhibits no distension and no mass. There is tenderness (mild tednerness to palpation on the right of the abdominal incision). There is no rebound and no guarding.  Large midline vertical incision with steri-strips in place.  No surrounding erythema or active drainage. Small area of edema to the right of midline about 3 cm at the mid-point of the incision. Very mildly tender to palpation.   Neurological: She is alert and oriented to person, place, and time.  Skin: Skin is warm and dry. No erythema.  Psychiatric: She has a normal mood and affect.     Results for orders placed during the hospital encounter of 07/04/12 (from the past 24 hour(s))  URINALYSIS, ROUTINE W REFLEX MICROSCOPIC     Status: Abnormal   Collection Time    07/04/12 10:10 PM      Result Value Range   Color, Urine YELLOW  YELLOW   APPearance CLEAR  CLEAR   Specific Gravity, Urine 1.025  1.005 - 1.030   pH 5.5  5.0 - 8.0   Glucose, UA NEGATIVE  NEGATIVE mg/dL   Hgb urine dipstick NEGATIVE  NEGATIVE   Bilirubin Urine NEGATIVE  NEGATIVE   Ketones, ur NEGATIVE  NEGATIVE mg/dL   Protein, ur NEGATIVE  NEGATIVE mg/dL   Urobilinogen, UA 0.2  0.0 - 1.0 mg/dL   Nitrite NEGATIVE  NEGATIVE   Leukocytes, UA SMALL (*) NEGATIVE  URINE MICROSCOPIC-ADD ON     Status: Abnormal   Collection Time    07/04/12 10:10 PM      Result Value Range   Squamous Epithelial / LPF RARE  RARE   WBC, UA 7-10  <3 WBC/hpf   RBC / HPF 21-50  <3 RBC/hpf   Bacteria, UA FEW (*) RARE  CBC     Status: Abnormal   Collection Time    07/04/12 11:10 PM      Result Value Range   WBC 7.4  4.0 - 10.5 K/uL   RBC 4.55  3.87 - 5.11 MIL/uL   Hemoglobin 11.6 (*) 12.0 - 15.0 g/dL   HCT 40.9 (*) 81.1 - 91.4 %   MCV 75.8 (*) 78.0 - 100.0 fL   MCH 25.5 (*) 26.0 - 34.0 pg   MCHC 33.6  30.0 - 36.0 g/dL   RDW 78.2 (*) 95.6 - 21.3 %   Platelets 315  150 - 400 K/uL  POCT PREGNANCY, URINE     Status: None   Collection Time    07/05/12 12:03 AM      Result Value Range   Preg Test, Ur NEGATIVE  NEGATIVE    MAU Course  Procedures None  MDM Mild pain on exam, mostly incision from abdominal surgery. WBC - WNL, afebrile today. Patient should call [facility] for appointment to  follow-up sooner for evaluation.   Assessment and Plan  A: Incisional pain PMS  P: Discharge home Patient may continue to take previously prescribed Rx for pain PRN Patient encouraged to call [facility] tomorrow to follow-up for incision pain Patient scheduled to see Dr. [name] for [facility] visit Friday. Keep appointment.  Patient may return to MAU as needed or if her condition were to change or worsen  Freddi Starr, PA-C  07/05/2012, 12:10 AM

## 2012-07-05 LAB — URINE CULTURE
Colony Count: NO GROWTH
Culture: NO GROWTH

## 2012-07-05 LAB — POCT PREGNANCY, URINE: Preg Test, Ur: NEGATIVE

## 2012-07-10 ENCOUNTER — Inpatient Hospital Stay (HOSPITAL_COMMUNITY)
Admission: AD | Admit: 2012-07-10 | Discharge: 2012-07-10 | Disposition: A | Discharge status: Home / Self Care | Payer: Medicaid Other | Source: Ambulatory Visit | Attending: Obstetrics | Admitting: Obstetrics

## 2012-07-10 ENCOUNTER — Encounter (HOSPITAL_COMMUNITY): Payer: Self-pay | Admitting: *Deleted

## 2012-07-10 DIAGNOSIS — N9489 Other specified conditions associated with female genital organs and menstrual cycle: Secondary | ICD-10-CM | POA: Insufficient documentation

## 2012-07-10 DIAGNOSIS — L293 Anogenital pruritus, unspecified: Secondary | ICD-10-CM | POA: Insufficient documentation

## 2012-07-10 DIAGNOSIS — R110 Nausea: Secondary | ICD-10-CM | POA: Insufficient documentation

## 2012-07-10 LAB — URINALYSIS, ROUTINE W REFLEX MICROSCOPIC
Bilirubin Urine: NEGATIVE
Nitrite: NEGATIVE
Protein, ur: NEGATIVE mg/dL
Specific Gravity, Urine: 1.01 (ref 1.005–1.030)
Urobilinogen, UA: 0.2 mg/dL (ref 0.0–1.0)

## 2012-07-10 LAB — URINE MICROSCOPIC-ADD ON

## 2012-07-10 MED ORDER — ONDANSETRON HCL 4 MG PO TABS
8.0000 mg | ORAL_TABLET | Freq: Once | ORAL | Status: AC
  Administered 2012-07-10: 8 mg via ORAL
  Filled 2012-07-10: qty 2

## 2012-07-10 MED ORDER — HYDROCORTISONE 1 % EX OINT
TOPICAL_OINTMENT | Freq: Two times a day (BID) | CUTANEOUS | Status: DC
  Administered 2012-07-10: via TOPICAL
  Filled 2012-07-10: qty 28.35

## 2012-07-10 NOTE — MAU Note (Signed)
Pt states she has been feeling sick on her stomac all day but has not vomited

## 2012-07-10 NOTE — MAU Provider Note (Signed)
History     CSN: [id number]  Arrival date and time: 07/10/12 2229   None     Chief Complaint  Patient presents with  . Nausea   HPI This is a 22 y.o. female who presents with c/o nausea that started at noon today.  Has not tried Zofran (which she has at home) for this . No vomiting, fever or pain. No one else sick at home.   Also c/o labial itching that occurred once this evening. She scratched it then rubbed it with a washcloth vigorously then noted that it began to swell. No discharge. No current itching. Declines STD testing, said she had it done last week.   RN Note: Pt states she has been feeling sick on her stomac all day but has not vomited      OB History   Grav Para Term Preterm Abortions TAB SAB Ect Mult Living   1 1 1  0 0 0 0 0 0 1      Past Medical History  Diagnosis Date  . Hypoglycemia   . Ovarian cyst 2011  . Constipation   . Sickle cell trait     Past Surgical History  Procedure Laterality Date  . Laparoscopic ovarian cystectomy  2011  . Eye surgery      2004  . Laparotomy N/A 06/16/2012    Procedure: EXPLORATORY LAPAROTOMY;  Surgeon: Emelia Loron, MD;  Location: [facility] OR;  Service: General;  Laterality: N/A;  Repair of duodenal ulcer  . Repair of perforated ulcer N/A 06/16/2012    Procedure: REPAIR OF PERFORATED ULCER;  Surgeon: Emelia Loron, MD;  Location: [facility] OR;  Service: General;  Laterality: N/A;  duodenal    Family History  Problem Relation Age of Onset  . Anesthesia problems Neg Hx   . Hearing loss Neg Hx   . Cancer Paternal Grandmother     breast    History  Substance Use Topics  . Smoking status: Never Smoker   . Smokeless tobacco: Never Used  . Alcohol Use: No    Allergies:  Allergies  Allergen Reactions  . Nsaids     Diagnosed with a perforated duodenal ulcer due to NSAIDS    Prescriptions prior to admission  Medication Sig Dispense Refill  . HYDROcodone-acetaminophen (NORCO/VICODIN) 5-325 MG per tablet Take  0.5-2 tablets by mouth every 4 (four) hours as needed.  40 tablet  0  . omeprazole (PRILOSEC) 40 MG capsule Take 1 capsule (40 mg total) by mouth daily. Take 30-60 minutes before a breakfast.  60 capsule  1  . oxyCODONE-acetaminophen (PERCOCET/ROXICET) 5-325 MG per tablet Take 1 tablet by mouth every 4 (four) hours as needed for pain.        Review of Systems  Constitutional: Negative for fever, chills and malaise/fatigue.  Gastrointestinal: Positive for nausea. Negative for vomiting, abdominal pain, diarrhea and constipation.  Genitourinary: Negative for dysuria.       Swelling of small part of right labia  Skin: Positive for itching.  Neurological: Negative for dizziness, weakness and headaches.   Physical Exam   Blood pressure 134/88, pulse 80, temperature 97.9 F (36.6 C), temperature source Oral, resp. rate 20, height 5\' 3"  (1.6 m), weight 133 lb 4 oz (60.442 kg), last menstrual period 07/10/2012, not currently breastfeeding.  Physical Exam  Constitutional: She is oriented to person, place, and time. She appears well-developed and well-nourished. No distress.  Cardiovascular: Normal rate.   Respiratory: Effort normal.  GI: Soft. She exhibits no distension and no  mass. There is no tenderness. There is no rebound and no guarding.  Genitourinary: Vagina normal and uterus normal. No vaginal discharge found.  SLight erethema of labia and introitus. No discharge at all Right labia is edematous, confined to one part. There is a healed labial laceration in this area. No lesions  Musculoskeletal: Normal range of motion.  Neurological: She is alert and oriented to person, place, and time.  Skin: Skin is warm and dry.  Psychiatric: She has a normal mood and affect.    MAU Course  Procedures  MDM Applied Hydrocortisone 1% cream to area. Advised use for 48 hrs only . Followup with Dr [name] (has appt tomorrow for IUD). Also advise cold compresses and no more scratching.   Assessment  and Plan  A:  Nausea, possibly early gastroenteritis      Labial swelling, due to scratching and tissue trauma  P:  Discharge      Cortisone cream prn for 48 hrs      Zofran for nausea      Conservative care      Followup with Dr [name] tomorrow  [facility] 07/10/2012, 11:35 PM

## 2012-07-11 ENCOUNTER — Encounter (INDEPENDENT_AMBULATORY_CARE_PROVIDER_SITE_OTHER): Payer: Medicaid Other | Admitting: General Surgery

## 2012-07-12 ENCOUNTER — Ambulatory Visit: Payer: Self-pay | Admitting: Obstetrics & Gynecology

## 2012-07-14 ENCOUNTER — Encounter: Payer: Self-pay | Admitting: *Deleted

## 2012-07-18 ENCOUNTER — Encounter: Payer: Self-pay | Admitting: Obstetrics

## 2012-07-18 ENCOUNTER — Other Ambulatory Visit: Payer: Self-pay | Admitting: Obstetrics

## 2012-07-18 ENCOUNTER — Ambulatory Visit (INDEPENDENT_AMBULATORY_CARE_PROVIDER_SITE_OTHER): Payer: Medicaid Other | Admitting: Obstetrics

## 2012-07-18 VITALS — BP 131/90 | HR 75 | Temp 98.4°F | Ht 63.0 in | Wt 132.0 lb

## 2012-07-18 DIAGNOSIS — Z30430 Encounter for insertion of intrauterine contraceptive device: Secondary | ICD-10-CM

## 2012-07-18 DIAGNOSIS — Z3202 Encounter for pregnancy test, result negative: Secondary | ICD-10-CM

## 2012-07-18 DIAGNOSIS — N760 Acute vaginitis: Secondary | ICD-10-CM

## 2012-07-18 LAB — POCT URINE PREGNANCY: Preg Test, Ur: NEGATIVE

## 2012-07-18 MED ORDER — LEVONORGESTREL 20 MCG/24HR IU IUD: 1.0000 | INTRAUTERINE_SYSTEM | Freq: Once | INTRAUTERINE | Status: DC

## 2012-07-18 NOTE — Addendum Note (Signed)
Addended by: Julaine Hua on: 07/18/2012 05:15 PM   Modules accepted: Orders

## 2012-07-18 NOTE — Progress Notes (Signed)
IUD Insertion Procedure Note  Pre-operative Diagnosis: Desires Contraception  Post-operative Diagnosis: same  Indications: contraception  Procedure Details  Urine pregnancy test was done YES and result was NEGATIVE.  The risks (including infection, bleeding, pain, and uterine perforation) and benefits of the procedure were explained to the patient and Written informed consent was obtained.    Cervix cleansed with Betadine. Uterus sounded to 8 cm. IUD inserted without difficulty. String visible and trimmed. Patient tolerated procedure well.  IUD Information: Mirena.  Condition: Stable  Complications: None  Plan:  The patient was advised to call for any fever or for prolonged or severe pain or bleeding. She was advised to use Percocet as needed for mild to moderate pain.   Attending Physician Documentation: I was present for or participated in the entire procedure, including opening and closing.

## 2012-07-18 NOTE — Patient Instructions (Signed)
Routine

## 2012-07-18 NOTE — Progress Notes (Signed)
Pt states she last had intercourse two days ago using condoms for protection.

## 2012-07-19 LAB — WET PREP BY MOLECULAR PROBE
Candida species: NEGATIVE
Gardnerella vaginalis: POSITIVE — AB
Trichomonas vaginosis: NEGATIVE

## 2012-07-19 LAB — GC/CHLAMYDIA PROBE AMP
CT Probe RNA: NEGATIVE
GC Probe RNA: NEGATIVE

## 2012-07-24 ENCOUNTER — Encounter (INDEPENDENT_AMBULATORY_CARE_PROVIDER_SITE_OTHER): Payer: Self-pay | Admitting: General Surgery

## 2012-07-31 ENCOUNTER — Ambulatory Visit: Payer: Medicaid Other | Admitting: Obstetrics

## 2012-08-04 ENCOUNTER — Other Ambulatory Visit: Payer: Self-pay | Admitting: *Deleted

## 2012-08-04 DIAGNOSIS — N760 Acute vaginitis: Secondary | ICD-10-CM

## 2012-08-04 MED ORDER — METRONIDAZOLE 500 MG PO TABS: 500.0000 mg | ORAL_TABLET | Freq: Two times a day (BID) | ORAL | Status: DC

## 2012-08-31 ENCOUNTER — Ambulatory Visit: Payer: Medicaid Other | Admitting: Obstetrics

## 2012-09-03 ENCOUNTER — Emergency Department (HOSPITAL_COMMUNITY)
Admission: EM | Admit: 2012-09-03 | Discharge: 2012-09-03 | Disposition: A | Discharge status: Home / Self Care | Payer: Self-pay | Attending: Emergency Medicine | Admitting: Emergency Medicine

## 2012-09-03 ENCOUNTER — Emergency Department (HOSPITAL_COMMUNITY): Payer: Self-pay

## 2012-09-03 ENCOUNTER — Encounter (HOSPITAL_COMMUNITY): Payer: Self-pay | Admitting: *Deleted

## 2012-09-03 DIAGNOSIS — Z8639 Personal history of other endocrine, nutritional and metabolic disease: Secondary | ICD-10-CM | POA: Insufficient documentation

## 2012-09-03 DIAGNOSIS — R1013 Epigastric pain: Secondary | ICD-10-CM | POA: Insufficient documentation

## 2012-09-03 DIAGNOSIS — Z79899 Other long term (current) drug therapy: Secondary | ICD-10-CM | POA: Insufficient documentation

## 2012-09-03 DIAGNOSIS — Z862 Personal history of diseases of the blood and blood-forming organs and certain disorders involving the immune mechanism: Secondary | ICD-10-CM | POA: Insufficient documentation

## 2012-09-03 DIAGNOSIS — Z8719 Personal history of other diseases of the digestive system: Secondary | ICD-10-CM | POA: Insufficient documentation

## 2012-09-03 DIAGNOSIS — Z3202 Encounter for pregnancy test, result negative: Secondary | ICD-10-CM | POA: Insufficient documentation

## 2012-09-03 DIAGNOSIS — R109 Unspecified abdominal pain: Secondary | ICD-10-CM

## 2012-09-03 DIAGNOSIS — Z8742 Personal history of other diseases of the female genital tract: Secondary | ICD-10-CM | POA: Insufficient documentation

## 2012-09-03 LAB — COMPREHENSIVE METABOLIC PANEL
AST: 16 U/L (ref 0–37)
Albumin: 4.1 g/dL (ref 3.5–5.2)
Alkaline Phosphatase: 73 U/L (ref 39–117)
BUN: 7 mg/dL (ref 6–23)
Chloride: 106 mEq/L (ref 96–112)
Potassium: 3.6 mEq/L (ref 3.5–5.1)
Sodium: 140 mEq/L (ref 135–145)
Total Bilirubin: 0.4 mg/dL (ref 0.3–1.2)
Total Protein: 7.4 g/dL (ref 6.0–8.3)

## 2012-09-03 LAB — CBC WITH DIFFERENTIAL/PLATELET
Basophils Absolute: 0 10*3/uL (ref 0.0–0.1)
Basophils Relative: 1 % (ref 0–1)
Eosinophils Absolute: 0.1 10*3/uL (ref 0.0–0.7)
Hemoglobin: 13 g/dL (ref 12.0–15.0)
MCH: 26.2 pg (ref 26.0–34.0)
MCHC: 34.6 g/dL (ref 30.0–36.0)
Neutro Abs: 3.5 10*3/uL (ref 1.7–7.7)
Neutrophils Relative %: 67 % (ref 43–77)
Platelets: 259 10*3/uL (ref 150–400)
RDW: 14.7 % (ref 11.5–15.5)

## 2012-09-03 LAB — URINALYSIS, ROUTINE W REFLEX MICROSCOPIC
Bilirubin Urine: NEGATIVE
Glucose, UA: NEGATIVE mg/dL
Ketones, ur: NEGATIVE mg/dL
Nitrite: NEGATIVE
Specific Gravity, Urine: 1.015 (ref 1.005–1.030)
pH: 5.5 (ref 5.0–8.0)

## 2012-09-03 LAB — URINE MICROSCOPIC-ADD ON

## 2012-09-03 LAB — LIPASE, BLOOD: Lipase: 19 U/L (ref 11–59)

## 2012-09-03 MED ORDER — ONDANSETRON 4 MG PO TBDP: 4.0000 mg | ORAL_TABLET | Freq: Three times a day (TID) | ORAL | Status: DC | PRN

## 2012-09-03 MED ORDER — HYDROMORPHONE HCL PF 1 MG/ML IJ SOLN
1.0000 mg | Freq: Once | INTRAMUSCULAR | Status: AC
  Administered 2012-09-03: 1 mg via INTRAVENOUS
  Filled 2012-09-03: qty 1

## 2012-09-03 MED ORDER — IOHEXOL 300 MG/ML  SOLN
100.0000 mL | Freq: Once | INTRAMUSCULAR | Status: AC | PRN
  Administered 2012-09-03: 100 mL via INTRAVENOUS

## 2012-09-03 MED ORDER — HYDROCODONE-ACETAMINOPHEN 5-325 MG PO TABS: 2.0000 | ORAL_TABLET | ORAL | Status: DC | PRN

## 2012-09-03 MED ORDER — PROMETHAZINE HCL 25 MG/ML IJ SOLN
25.0000 mg | Freq: Once | INTRAMUSCULAR | Status: AC
  Administered 2012-09-03: 25 mg via INTRAVENOUS
  Filled 2012-09-03: qty 1

## 2012-09-03 MED ORDER — PANTOPRAZOLE SODIUM 40 MG IV SOLR
40.0000 mg | Freq: Once | INTRAVENOUS | Status: AC
  Administered 2012-09-03: 40 mg via INTRAVENOUS
  Filled 2012-09-03: qty 40

## 2012-09-03 MED ORDER — ONDANSETRON HCL 4 MG/2ML IJ SOLN
4.0000 mg | Freq: Once | INTRAMUSCULAR | Status: AC
  Administered 2012-09-03: 4 mg via INTRAVENOUS
  Filled 2012-09-03: qty 2

## 2012-09-03 MED ORDER — IOHEXOL 300 MG/ML  SOLN
50.0000 mL | Freq: Once | INTRAMUSCULAR | Status: AC | PRN
  Administered 2012-09-03: 50 mL via ORAL

## 2012-09-03 NOTE — ED Provider Notes (Signed)
History     CSN: [id number]  Arrival date & time 09/03/12  0904   First MD Initiated Contact with Patient 09/03/12 1112      Chief Complaint  Patient presents with  . Abdominal Pain    (Consider location/radiation/quality/duration/timing/severity/associated sxs/prior treatment) HPI Comments: Patient is a 22 year old female with a past medical history of perforated ulcer repair in February 2014 who presents with epigastric abdominal pain since yesterday. The pain is located in her epigastrium and does not radiate. The pain is described as sharp and severe. The pain started gradually and progressively worsened since the onset. No alleviating/aggravating factors. The patient has tried nothing for symptoms without relief. Associated symptoms include nothing. Patient denies fever, headache, NVD, chest pain, SOB, dysuria, constipation, abnormal vaginal bleeding/discharge. Patient is currently menstruating.      Past Medical History  Diagnosis Date  . Hypoglycemia   . Ovarian cyst 2011  . Constipation   . Sickle cell trait   . History of dysmenorrhea     Past Surgical History  Procedure Laterality Date  . Laparoscopic ovarian cystectomy  2011  . Eye surgery      2004  . Laparotomy N/A 06/16/2012    Procedure: EXPLORATORY LAPAROTOMY;  Surgeon: Emelia Loron, MD;  Location: [facility] OR;  Service: General;  Laterality: N/A;  Repair of duodenal ulcer  . Repair of perforated ulcer N/A 06/16/2012    Procedure: REPAIR OF PERFORATED ULCER;  Surgeon: Emelia Loron, MD;  Location: [facility] OR;  Service: General;  Laterality: N/A;  duodenal    Family History  Problem Relation Age of Onset  . Anesthesia problems Neg Hx   . Hearing loss Neg Hx   . Breast cancer Paternal Grandmother   . Brain cancer      History  Substance Use Topics  . Smoking status: Never Smoker   . Smokeless tobacco: Never Used  . Alcohol Use: No    OB History   Grav Para Term Preterm Abortions TAB SAB Ect Mult  Living   1 1 1  0 0 0 0 0 0 1      Review of Systems  Gastrointestinal: Positive for abdominal pain.  All other systems reviewed and are negative.    Allergies  Nsaids  Home Medications   Current Outpatient Rx  Name  Route  Sig  Dispense  Refill  . HYDROcodone-acetaminophen (NORCO/VICODIN) 5-325 MG per tablet   Oral   Take 0.5-2 tablets by mouth every 4 (four) hours as needed.   40 tablet   0   . levonorgestrel (MIRENA) 20 MCG/24HR IUD   Intrauterine   1 Intra Uterine Device (1 each total) by Intrauterine route once.   1 each   0   . metroNIDAZOLE (FLAGYL) 500 MG tablet   Oral   Take 1 tablet (500 mg total) by mouth 2 (two) times daily.   14 tablet   0   . omeprazole (PRILOSEC) 40 MG capsule   Oral   Take 1 capsule (40 mg total) by mouth daily. Take 30-60 minutes before a breakfast.   60 capsule   1   . oxyCODONE-acetaminophen (PERCOCET/ROXICET) 5-325 MG per tablet   Oral   Take 1 tablet by mouth every 4 (four) hours as needed for pain.           BP 122/78  Pulse 90  Temp(Src) 98.3 F (36.8 C) (Oral)  Resp 18  SpO2 98%  LMP 08/29/2012  Physical Exam  Nursing note and vitals  reviewed. Constitutional: She is oriented to person, place, and time. She appears well-developed and well-nourished. No distress.  HENT:  Head: Normocephalic and atraumatic.  Eyes: Conjunctivae and EOM are normal. Pupils are equal, round, and reactive to light.  Neck: Normal range of motion.  Cardiovascular: Normal rate and regular rhythm.  Exam reveals no gallop and no friction rub.   No murmur heard. Pulmonary/Chest: Effort normal and breath sounds normal. She has no wheezes. She has no rales. She exhibits no tenderness.  Abdominal: Soft. She exhibits no distension. There is tenderness. There is no rebound and no guarding.  4cm healed vertical incision of epigastric area of abdomen that is tender to palpation. No open wound, no drainage noted.   Musculoskeletal: Normal range  of motion.  Neurological: She is alert and oriented to person, place, and time. Coordination normal.  Speech is goal-oriented. Moves limbs without ataxia.   Skin: Skin is warm and dry.  Psychiatric: She has a normal mood and affect. Her behavior is normal.    ED Course  Procedures (including critical care time)  Labs Reviewed  CBC WITH DIFFERENTIAL - Abnormal; Notable for the following:    MCV 75.8 (*)    All other components within normal limits  URINALYSIS, ROUTINE W REFLEX MICROSCOPIC - Abnormal; Notable for the following:    Hgb urine dipstick LARGE (*)    Leukocytes, UA SMALL (*)    All other components within normal limits  URINE MICROSCOPIC-ADD ON - Abnormal; Notable for the following:    Squamous Epithelial / LPF MANY (*)    All other components within normal limits  COMPREHENSIVE METABOLIC PANEL  LIPASE, BLOOD  POCT PREGNANCY, URINE   Ct Abdomen Pelvis W Contrast  09/03/2012  *RADIOLOGY REPORT*  Clinical Data: 22 year old female with abdominal and pelvic pain. History of perforated peptic ulcer 3 months ago.  CT ABDOMEN AND PELVIS WITH CONTRAST  Technique:  Multidetector CT imaging of the abdomen and pelvis was performed following the standard protocol during bolus administration of intravenous contrast.  Contrast: OMNIPAQUE IOHEXOL 300 MG/ML  SOLN  Comparison: 06/16/2012 CT  Findings: The liver, spleen, pancreas, adrenal glands, gallbladder and kidneys are unremarkable except for a stable right renal cyst.  No free fluid, enlarged lymph nodes, biliary dilation or abdominal aortic aneurysm identified.  The bowel, appendix and bladder are unremarkable. An IUD is present. There is no evidence of oral contrast extravasation, bowel obstruction, abscess or pneumoperitoneum.  No bowel wall thickening is noted.  No acute or suspicious bony abnormalities are identified.  IMPRESSION: No evidence of acute or significant abnormality.   Original Report Authenticated By: Harmon Pier, M.D.       1. Abdominal pain       MDM  12:39 PM Labs unremarkable. Hemoglobin in patient's urine due to menstrual cycle. Patient afebrile with stable vitals at this time. Patient will have a CT abdomen and pelvis.  3:07 PM CT abdomen pelvis unremarkable for acute changes. Patient feeling better. Vitals stable and patient is afebrile. Patient will be discharged with instructions to follow up with her surgeon or PCP as needed. Patient instructed to return with worsening or concerning symptoms.       Emilia Beck, PA-C 09/03/12 1513

## 2012-09-03 NOTE — ED Notes (Signed)
Patient transported to CT 

## 2012-09-03 NOTE — ED Notes (Signed)
Pt had perforated ulcer in February 2014 and here with mid upper abdominal pain over incision.  Pt is feeling the same pain and denies vomiting or diarrhea.  Pt states it started hurting again yesterday

## 2012-09-05 NOTE — ED Provider Notes (Signed)
Medical screening examination/treatment/procedure(s) were performed by non-physician practitioner and as supervising physician I was immediately available for consultation/collaboration.   Suzi Roots, MD 09/05/12 [phone]

## 2012-09-14 ENCOUNTER — Emergency Department (HOSPITAL_COMMUNITY): Payer: Self-pay

## 2012-09-14 ENCOUNTER — Emergency Department (HOSPITAL_COMMUNITY)
Admission: EM | Admit: 2012-09-14 | Discharge: 2012-09-15 | Disposition: A | Discharge status: Home / Self Care | Payer: Self-pay | Attending: Emergency Medicine | Admitting: Emergency Medicine

## 2012-09-14 ENCOUNTER — Encounter (HOSPITAL_COMMUNITY): Payer: Self-pay | Admitting: *Deleted

## 2012-09-14 DIAGNOSIS — Z8719 Personal history of other diseases of the digestive system: Secondary | ICD-10-CM | POA: Insufficient documentation

## 2012-09-14 DIAGNOSIS — Y849 Medical procedure, unspecified as the cause of abnormal reaction of the patient, or of later complication, without mention of misadventure at the time of the procedure: Secondary | ICD-10-CM | POA: Insufficient documentation

## 2012-09-14 DIAGNOSIS — Z8742 Personal history of other diseases of the female genital tract: Secondary | ICD-10-CM | POA: Insufficient documentation

## 2012-09-14 DIAGNOSIS — Z3202 Encounter for pregnancy test, result negative: Secondary | ICD-10-CM | POA: Insufficient documentation

## 2012-09-14 DIAGNOSIS — Z862 Personal history of diseases of the blood and blood-forming organs and certain disorders involving the immune mechanism: Secondary | ICD-10-CM | POA: Insufficient documentation

## 2012-09-14 DIAGNOSIS — R109 Unspecified abdominal pain: Secondary | ICD-10-CM | POA: Insufficient documentation

## 2012-09-14 DIAGNOSIS — Z8639 Personal history of other endocrine, nutritional and metabolic disease: Secondary | ICD-10-CM | POA: Insufficient documentation

## 2012-09-14 DIAGNOSIS — R110 Nausea: Secondary | ICD-10-CM | POA: Insufficient documentation

## 2012-09-14 LAB — CBC
HCT: 35 % — ABNORMAL LOW (ref 36.0–46.0)
Hemoglobin: 12.3 g/dL (ref 12.0–15.0)
MCH: 26.6 pg (ref 26.0–34.0)
MCHC: 35.1 g/dL (ref 30.0–36.0)
RDW: 14.1 % (ref 11.5–15.5)

## 2012-09-14 LAB — COMPREHENSIVE METABOLIC PANEL
BUN: 10 mg/dL (ref 6–23)
Calcium: 9.8 mg/dL (ref 8.4–10.5)
GFR calc Af Amer: >90 mL/min (ref >90)
Glucose, Bld: 83 mg/dL (ref 70–99)
Total Protein: 7 g/dL (ref 6.0–8.3)

## 2012-09-14 LAB — CG4 I-STAT (LACTIC ACID): Lactic Acid, Venous: 0.88 mmol/L (ref 0.5–2.2)

## 2012-09-14 LAB — LACTIC ACID, PLASMA: Lactic Acid, Venous: 0.9 mmol/L (ref 0.5–2.2)

## 2012-09-14 LAB — POCT PREGNANCY, URINE: Preg Test, Ur: NEGATIVE

## 2012-09-14 LAB — LIPASE, BLOOD: Lipase: 22 U/L (ref 11–59)

## 2012-09-14 MED ORDER — HYDROMORPHONE HCL PF 1 MG/ML IJ SOLN
0.5000 mg | Freq: Once | INTRAMUSCULAR | Status: AC
  Administered 2012-09-14: 1 mg via INTRAVENOUS
  Filled 2012-09-14: qty 1

## 2012-09-14 MED ORDER — HYDROMORPHONE HCL PF 1 MG/ML IJ SOLN
0.5000 mg | Freq: Once | INTRAMUSCULAR | Status: AC
  Administered 2012-09-14: 0.5 mg via INTRAVENOUS
  Filled 2012-09-14: qty 1

## 2012-09-14 MED ORDER — SODIUM CHLORIDE 0.9 % IV BOLUS (SEPSIS)
1000.0000 mL | Freq: Once | INTRAVENOUS | Status: AC
  Administered 2012-09-14: 1000 mL via INTRAVENOUS

## 2012-09-14 MED ORDER — ONDANSETRON HCL 4 MG/2ML IJ SOLN
4.0000 mg | Freq: Once | INTRAMUSCULAR | Status: AC
  Administered 2012-09-14: 4 mg via INTRAVENOUS
  Filled 2012-09-14: qty 2

## 2012-09-14 NOTE — ED Provider Notes (Signed)
History     CSN: [id number]  Arrival date & time 09/14/12  7829   First MD Initiated Contact with Patient 09/14/12 1859      Chief Complaint  Patient presents with  . Post-op Problem    (Consider location/radiation/quality/duration/timing/severity/associated sxs/prior treatment) HPI Comments: 22 y.o. female who has a hx of perforated ulcer (2/14), presents to the Er w/ the cc of abdominal pain. Was evaluated for abdominal pain a few weeks ago, but states that the pain has returned. No fevers. No vomiting. Pain similar to prior.  Patient is a 22 y.o. female presenting with general illness. The history is provided by the patient.  Illness Location:  Abdomen Severity:  Mild Onset quality:  Gradual Timing:  Constant Progression:  Worsening Chronicity:  Recurrent Associated symptoms: abdominal pain and nausea   Associated symptoms: no chest pain, no cough, no diarrhea, no fatigue, no fever, no headaches, no rash, no vomiting and no wheezing     Past Medical History  Diagnosis Date  . Hypoglycemia   . Ovarian cyst 2011  . Constipation   . Sickle cell trait   . History of dysmenorrhea     Past Surgical History  Procedure Laterality Date  . Laparoscopic ovarian cystectomy  2011  . Eye surgery      2004  . Laparotomy N/A 06/16/2012    Procedure: EXPLORATORY LAPAROTOMY;  Surgeon: Emelia Loron, MD;  Location: [facility] OR;  Service: General;  Laterality: N/A;  Repair of duodenal ulcer  . Repair of perforated ulcer N/A 06/16/2012    Procedure: REPAIR OF PERFORATED ULCER;  Surgeon: Emelia Loron, MD;  Location: [facility] OR;  Service: General;  Laterality: N/A;  duodenal    Family History  Problem Relation Age of Onset  . Anesthesia problems Neg Hx   . Hearing loss Neg Hx   . Breast cancer Paternal Grandmother   . Brain cancer      History  Substance Use Topics  . Smoking status: Never Smoker   . Smokeless tobacco: Never Used  . Alcohol Use: No    OB History   Grav Para  Term Preterm Abortions TAB SAB Ect Mult Living   1 1 1  0 0 0 0 0 0 1      Review of Systems  Constitutional: Negative for fever, chills and fatigue.  HENT: Negative for facial swelling, drooling, neck pain and dental problem.   Eyes: Negative for pain, discharge and itching.  Respiratory: Negative for cough, choking, wheezing and stridor.   Cardiovascular: Negative for chest pain.  Gastrointestinal: Positive for nausea and abdominal pain. Negative for vomiting and diarrhea.  Endocrine: Negative for cold intolerance and heat intolerance.  Genitourinary: Negative for vaginal discharge, difficulty urinating and vaginal pain.  Skin: Negative for pallor and rash.  Neurological: Negative for dizziness, light-headedness and headaches.  Psychiatric/Behavioral: Negative for behavioral problems and agitation.    Allergies  Nsaids  Home Medications   Current Outpatient Rx  Name  Route  Sig  Dispense  Refill  . HYDROcodone-acetaminophen (NORCO/VICODIN) 5-325 MG per tablet   Oral   Take 2 tablets by mouth every 4 (four) hours as needed for pain.   6 tablet   0   . levonorgestrel (MIRENA) 20 MCG/24HR IUD   Intrauterine   1 Intra Uterine Device (1 each total) by Intrauterine route once.   1 each   0   . ondansetron (ZOFRAN ODT) 4 MG disintegrating tablet   Oral   Take 1 tablet (4 mg  total) by mouth every 8 (eight) hours as needed for nausea.   10 tablet   0     BP 125/79  Pulse 80  Temp(Src) 98.3 F (36.8 C)  Resp 20  SpO2 97%  LMP 08/29/2012  Physical Exam  Constitutional: She is oriented to person, place, and time. She appears well-developed. No distress.  HENT:  Head: Normocephalic and atraumatic.  Eyes: Pupils are equal, round, and reactive to light. Right eye exhibits no discharge. Left eye exhibits no discharge.  Neck: Neck supple. No tracheal deviation present.  Cardiovascular: Normal rate.  Exam reveals no gallop and no friction rub.   Pulmonary/Chest: No  stridor. No respiratory distress. She has no wheezes.  Abdominal: Soft. She exhibits no distension. There is tenderness (has mild ttp near surgical site, no fluctuance, no erythema. ). There is no rebound.  Healed scar in abdomen   Musculoskeletal: She exhibits no edema and no tenderness.  Neurological: She is alert and oriented to person, place, and time.  Skin: Skin is warm. She is not diaphoretic.    ED Course  Procedures (including critical care time)  Labs Reviewed  CBC - Abnormal; Notable for the following:    HCT 35.0 (*)    MCV 75.6 (*)    All other components within normal limits  COMPREHENSIVE METABOLIC PANEL - Abnormal; Notable for the following:    Total Bilirubin 0.2 (*)    GFR calc non Af Amer 79 (*)    All other components within normal limits  LIPASE, BLOOD  LACTIC ACID, PLASMA  URINALYSIS, ROUTINE W REFLEX MICROSCOPIC  POCT PREGNANCY, URINE  CG4 I-STAT (LACTIC ACID)   Dg Chest Portable 1 View  09/14/2012   *RADIOLOGY REPORT*  Clinical Data: Shortness of breath  PORTABLE CHEST - 1 VIEW  Comparison: Portable exam 1946 hours compared to 06/16/2012  Findings: Normal heart size, mediastinal contours, and pulmonary vascularity. Lungs clear. No pleural effusion or pneumothorax. No free air under the hemidiaphragms. Bones unremarkable.  IMPRESSION: No acute abnormalities.   Original Report Authenticated By: Ulyses Southward, M.D.   Dg Abd 2 Views  09/14/2012   *RADIOLOGY REPORT*  Clinical Data: Abdominal pain, nausea vomiting.  ABDOMEN - 2 VIEW  Comparison: CT scan 09/03/2012.  Findings: The lung bases are clear.  The bowel gas pattern is unremarkable.  Moderate stool throughout the colon.  No findings for small bowel obstruction or free air.  The soft tissue shadows are maintained.  An IUD is noted in the pelvis.  The bony structures are intact.  IMPRESSION: Unremarkable abdominal radiographs.   Original Report Authenticated By: Rudie Meyer, M.D.     MDM  Hx of perforated  ulcer, symptoms are similar to prior documentation -- does not have peritoneal signs on exam. She was evaluated by [place] within the past 2 weeks for similar symptoms -- she had CT scan done within the past two weeks that did not show acute pathology. AAS is done and it does not show acute pathology. Pt's lab work is unremarkable. With no peritoneal signs, pt's symptoms similar to prior, and recent CT imaging, and with normal vital signs -- pt does not need repeat CT imaging at this time. She has had 4 Ct scans done within the past 4 months. Her pain is under control, repeat abdominal exams are done throughout her visit in the Er -- similar, no worsening symptoms. Pt is told to f/u with her pcp tomorrow for re-evaluation, and that she needs to see her  surgeon for further evaluation and care.   1. Abdominal pain             Bernadene Person, MD 09/15/12 0045

## 2012-09-14 NOTE — ED Notes (Signed)
Family at bedside. Assisting with ambulation to BR

## 2012-09-14 NOTE — ED Provider Notes (Signed)
Patient seen/examined in the Emergency Department in conjunction with Resident Physician Provider [facility] Patient reports abdominal pain Exam : awake/alert, focal tenderness over well healed scar but no other focal abdominal tenderness, no rebound or guarding Plan: will treat pain, check labs and reassess   Sherry Gaskins, MD 09/14/12 2010

## 2012-09-14 NOTE — ED Notes (Signed)
Pt reports swelling at abdomen surgical site and SOB since this afternoon.  Pt reports pain at surgical site.  Slight swelling noted.  No redness or drainage noted.  Pt ambulatory, talking in complete sentences.

## 2012-09-14 NOTE — ED Notes (Signed)
Family at bedside. 

## 2012-09-15 LAB — URINALYSIS, ROUTINE W REFLEX MICROSCOPIC
Bilirubin Urine: NEGATIVE
Ketones, ur: NEGATIVE mg/dL
Nitrite: NEGATIVE
Protein, ur: NEGATIVE mg/dL
Urobilinogen, UA: 0.2 mg/dL (ref 0.0–1.0)
pH: 7 (ref 5.0–8.0)

## 2012-09-15 LAB — URINE MICROSCOPIC-ADD ON

## 2012-09-15 NOTE — ED Provider Notes (Signed)
Date: 09/14/2012  Rate: 93  Rhythm: normal sinus rhythm  QRS Axis: right  Intervals: normal  ST/T Wave abnormalities: nonspecific ST changes  Conduction Disutrbances:none  Narrative Interpretation:   Old EKG Reviewed: unchanged    Joya Gaskins, MD 09/15/12 [phone]

## 2012-09-15 NOTE — ED Notes (Signed)
Discharge instructions given to patient. Patient nonverbally upset and refuses to discuss discharge instructions. Pt not making eyecontact and making short terse movements when gathering belonings for D/C

## 2012-09-16 NOTE — ED Provider Notes (Signed)
I have personally seen and examined the patient.  I have discussed the plan of care with the resident.  I have reviewed the documentation on PMH/FH/Soc. History.  I have reviewed the documentation of the resident and agree.   Joya Gaskins, MD 09/16/12 [phone]

## 2012-10-06 ENCOUNTER — Encounter (HOSPITAL_COMMUNITY): Payer: Self-pay | Admitting: *Deleted

## 2012-10-06 ENCOUNTER — Emergency Department (HOSPITAL_COMMUNITY)
Admission: EM | Admit: 2012-10-06 | Discharge: 2012-10-06 | Disposition: A | Discharge status: Home / Self Care | Payer: Self-pay | Attending: Emergency Medicine | Admitting: Emergency Medicine

## 2012-10-06 DIAGNOSIS — Z8742 Personal history of other diseases of the female genital tract: Secondary | ICD-10-CM | POA: Insufficient documentation

## 2012-10-06 DIAGNOSIS — Z862 Personal history of diseases of the blood and blood-forming organs and certain disorders involving the immune mechanism: Secondary | ICD-10-CM | POA: Insufficient documentation

## 2012-10-06 DIAGNOSIS — Z8639 Personal history of other endocrine, nutritional and metabolic disease: Secondary | ICD-10-CM | POA: Insufficient documentation

## 2012-10-06 DIAGNOSIS — Z9889 Other specified postprocedural states: Secondary | ICD-10-CM | POA: Insufficient documentation

## 2012-10-06 DIAGNOSIS — R110 Nausea: Secondary | ICD-10-CM | POA: Insufficient documentation

## 2012-10-06 DIAGNOSIS — Z3202 Encounter for pregnancy test, result negative: Secondary | ICD-10-CM | POA: Insufficient documentation

## 2012-10-06 DIAGNOSIS — R109 Unspecified abdominal pain: Secondary | ICD-10-CM | POA: Insufficient documentation

## 2012-10-06 DIAGNOSIS — Z8719 Personal history of other diseases of the digestive system: Secondary | ICD-10-CM | POA: Insufficient documentation

## 2012-10-06 LAB — URINALYSIS, ROUTINE W REFLEX MICROSCOPIC
Glucose, UA: NEGATIVE mg/dL
Hgb urine dipstick: NEGATIVE
Protein, ur: NEGATIVE mg/dL
Specific Gravity, Urine: 1.015 (ref 1.005–1.030)
Urobilinogen, UA: 0.2 mg/dL (ref 0.0–1.0)

## 2012-10-06 LAB — POCT PREGNANCY, URINE: Preg Test, Ur: NEGATIVE

## 2012-10-06 LAB — URINE MICROSCOPIC-ADD ON

## 2012-10-06 MED ORDER — ONDANSETRON 4 MG PO TBDP
8.0000 mg | ORAL_TABLET | Freq: Once | ORAL | Status: AC
  Administered 2012-10-06: 8 mg via ORAL
  Filled 2012-10-06: qty 2

## 2012-10-06 MED ORDER — ACETAMINOPHEN 325 MG PO TABS
650.0000 mg | ORAL_TABLET | Freq: Once | ORAL | Status: AC
  Administered 2012-10-06: 650 mg via ORAL
  Filled 2012-10-06: qty 2

## 2012-10-06 NOTE — ED Provider Notes (Signed)
History     CSN: [id number]  Arrival date & time 10/06/12  0946   First MD Initiated Contact with Patient 10/06/12 [phone]      Chief Complaint  Patient presents with  . Abdominal Pain     Patient is a 22 y.o. female presenting with abdominal pain. The history is provided by the patient.  Abdominal Pain This is a chronic problem. The current episode started more than 1 week ago. The problem occurs daily. The problem has been gradually worsening. Associated symptoms include abdominal pain. Exacerbated by: palpation. Nothing relieves the symptoms. Treatments tried: oral morphine. The treatment provided no relief.  pt reports to have continued pain in her upper abdomen over her incision scar.  She reports she had surgery in February and continues to have pain She reports nausea but no vomiting No fever No cp/sob No vag bleeding/discharge She reports pain is similar to prior episodes  She admits taking her grandmother's oral morphine last night for pain and it did not provide much relief Denies any other drug use  Past Medical History  Diagnosis Date  . Hypoglycemia   . Ovarian cyst 2011  . Constipation   . Sickle cell trait   . History of dysmenorrhea     Past Surgical History  Procedure Laterality Date  . Laparoscopic ovarian cystectomy  2011  . Eye surgery      2004  . Laparotomy N/A 06/16/2012    Procedure: EXPLORATORY LAPAROTOMY;  Surgeon: Emelia Loron, MD;  Location: [facility] OR;  Service: General;  Laterality: N/A;  Repair of duodenal ulcer  . Repair of perforated ulcer N/A 06/16/2012    Procedure: REPAIR OF PERFORATED ULCER;  Surgeon: Emelia Loron, MD;  Location: [facility] OR;  Service: General;  Laterality: N/A;  duodenal    Family History  Problem Relation Age of Onset  . Anesthesia problems Neg Hx   . Hearing loss Neg Hx   . Breast cancer Paternal Grandmother   . Brain cancer      History  Substance Use Topics  . Smoking status: Never Smoker   . Smokeless  tobacco: Never Used  . Alcohol Use: No    OB History   Grav Para Term Preterm Abortions TAB SAB Ect Mult Living   1 1 1  0 0 0 0 0 0 1      Review of Systems  Constitutional: Negative for fever.  Gastrointestinal: Positive for abdominal pain.  Genitourinary: Negative for dysuria, vaginal bleeding and vaginal discharge.  All other systems reviewed and are negative.    Allergies  Nsaids  Home Medications   Current Outpatient Rx  Name  Route  Sig  Dispense  Refill  . levonorgestrel (MIRENA) 20 MCG/24HR IUD   Intrauterine   1 Intra Uterine Device (1 each total) by Intrauterine route once.   1 each   0   . morphine (MSIR) 15 MG tablet   Oral   Take 15 mg by mouth every 4 (four) hours as needed for pain.           BP 127/81  Pulse 93  Temp(Src) 98.4 F (36.9 C) (Oral)  Resp 18  SpO2 100%  LMP 09/03/2012  Physical Exam CONSTITUTIONAL: Well developed/well nourished HEAD: Normocephalic/atraumatic EYES: EOMI/PERRL ENMT: Mucous membranes moist NECK: supple no meningeal signs CV: S1/S2 noted, no murmurs/rubs/gallops noted LUNGS: Lungs are clear to auscultation bilaterally, no apparent distress ABDOMEN: soft, focally tender over abdominal incision, but no warmth/redness noted.  It is well healed.  No hernia noted, no rebound or guarding. No other focal tenderness.   GU:no cva tenderness NEURO: Pt is awake/alert, moves all extremitiesx4 EXTREMITIES: pulses normal, full ROM SKIN: warm, color normal PSYCH: no abnormalities of mood noted  ED Course  Procedures  Labs Reviewed  URINALYSIS, ROUTINE W REFLEX MICROSCOPIC  POCT PREGNANCY, URINE    11:45 AM Pt presents for continued pain in her incision from previous surgery The rest of her abdominal exam is unremarkable She is well appearing This appears to be a chronic process I advised her not to take other people's prescription I doubt an acute abdominal process at this time   MDM  Nursing notes including  past medical history and social history reviewed and considered in documentation Labs/vital reviewed and considered Previous records reviewed and considered         Joya Gaskins, MD 10/06/12 1147

## 2012-10-06 NOTE — ED Notes (Signed)
Pt requesting pain medication. MD notified. Pt updated on plan of care.

## 2012-10-06 NOTE — ED Notes (Signed)
Pt arrived by [facility] for abd pain, hx of abd surgery in feb. Reports upper abd pain x 2-3 days with n/v.

## 2012-10-07 LAB — URINE CULTURE: Colony Count: 40000

## 2012-10-22 ENCOUNTER — Emergency Department (HOSPITAL_COMMUNITY)
Admission: EM | Admit: 2012-10-22 | Discharge: 2012-10-22 | Disposition: A | Discharge status: Home / Self Care | Payer: Self-pay | Attending: Emergency Medicine | Admitting: Emergency Medicine

## 2012-10-22 ENCOUNTER — Emergency Department (HOSPITAL_COMMUNITY): Payer: Self-pay

## 2012-10-22 ENCOUNTER — Encounter (HOSPITAL_COMMUNITY): Payer: Self-pay | Admitting: Physical Medicine and Rehabilitation

## 2012-10-22 DIAGNOSIS — Z8639 Personal history of other endocrine, nutritional and metabolic disease: Secondary | ICD-10-CM | POA: Insufficient documentation

## 2012-10-22 DIAGNOSIS — Z8742 Personal history of other diseases of the female genital tract: Secondary | ICD-10-CM | POA: Insufficient documentation

## 2012-10-22 DIAGNOSIS — J029 Acute pharyngitis, unspecified: Secondary | ICD-10-CM | POA: Insufficient documentation

## 2012-10-22 DIAGNOSIS — Z862 Personal history of diseases of the blood and blood-forming organs and certain disorders involving the immune mechanism: Secondary | ICD-10-CM | POA: Insufficient documentation

## 2012-10-22 DIAGNOSIS — R52 Pain, unspecified: Secondary | ICD-10-CM | POA: Insufficient documentation

## 2012-10-22 DIAGNOSIS — R509 Fever, unspecified: Secondary | ICD-10-CM | POA: Insufficient documentation

## 2012-10-22 DIAGNOSIS — J3489 Other specified disorders of nose and nasal sinuses: Secondary | ICD-10-CM | POA: Insufficient documentation

## 2012-10-22 DIAGNOSIS — IMO0001 Reserved for inherently not codable concepts without codable children: Secondary | ICD-10-CM | POA: Insufficient documentation

## 2012-10-22 DIAGNOSIS — B349 Viral infection, unspecified: Secondary | ICD-10-CM

## 2012-10-22 DIAGNOSIS — D573 Sickle-cell trait: Secondary | ICD-10-CM | POA: Insufficient documentation

## 2012-10-22 DIAGNOSIS — B9789 Other viral agents as the cause of diseases classified elsewhere: Secondary | ICD-10-CM | POA: Insufficient documentation

## 2012-10-22 DIAGNOSIS — Z8719 Personal history of other diseases of the digestive system: Secondary | ICD-10-CM | POA: Insufficient documentation

## 2012-10-22 DIAGNOSIS — R51 Headache: Secondary | ICD-10-CM | POA: Insufficient documentation

## 2012-10-22 LAB — RAPID STREP SCREEN (MED CTR MEBANE ONLY): Streptococcus, Group A Screen (Direct): NEGATIVE

## 2012-10-22 MED ORDER — ACETAMINOPHEN 160 MG/5ML PO SOLN: 500.0000 mg | Freq: Once | ORAL | Status: DC

## 2012-10-22 MED ORDER — DEXAMETHASONE SODIUM PHOSPHATE 10 MG/ML IJ SOLN
10.0000 mg | Freq: Once | INTRAMUSCULAR | Status: AC
  Administered 2012-10-22: 10 mg via INTRAMUSCULAR
  Filled 2012-10-22: qty 1

## 2012-10-22 MED ORDER — AMOXICILLIN-POT CLAVULANATE 875-125 MG PO TABS: 1.0000 | ORAL_TABLET | Freq: Two times a day (BID) | ORAL | Status: DC

## 2012-10-22 MED ORDER — DIPHENHYDRAMINE HCL 50 MG/ML IJ SOLN
25.0000 mg | Freq: Once | INTRAMUSCULAR | Status: AC
  Administered 2012-10-22: 25 mg via INTRAMUSCULAR
  Filled 2012-10-22: qty 1

## 2012-10-22 NOTE — ED Provider Notes (Signed)
History    CSN: [id number] Arrival date & time 10/22/12  0272  First MD Initiated Contact with Patient 10/22/12 0701     Chief Complaint  Patient presents with  . Sore Throat  . Facial Pain   (Consider location/radiation/quality/duration/timing/severity/associated sxs/prior Treatment) The history is provided by the patient and a significant other. No language interpreter was used.  [name] is a 22 y/o F with PMHx of hypoglyecemia, ovarian cysts, sickle cell trait presenting to the ED with sore throat, fever, and body aches that started yesterday. Patient reported that the throat aches and experiences pain when swallowing. Patient reported that she took an Excedrin yesterday, but experienced minimal relief. Stated that her body aches all over, predominantly to the ears, eyes, shoulders, and ribs. Stated that nothing makes the discomfort better or worse. Reported that she is having a headache. Associated symptoms are nasal congestion and facial fullness. Denied chest pain, shortness of breath, difficulty breathing, chills, nausea, vomiting, diarrhea, tingling, numbness, melena, hematochezia, no sick contacts.   Past Medical History  Diagnosis Date  . Hypoglycemia   . Ovarian cyst 2011  . Constipation   . Sickle cell trait   . History of dysmenorrhea    Past Surgical History  Procedure Laterality Date  . Laparoscopic ovarian cystectomy  2011  . Eye surgery      2004  . Laparotomy N/A 06/16/2012    Procedure: EXPLORATORY LAPAROTOMY;  Surgeon: Emelia Loron, MD;  Location: [facility] OR;  Service: General;  Laterality: N/A;  Repair of duodenal ulcer  . Repair of perforated ulcer N/A 06/16/2012    Procedure: REPAIR OF PERFORATED ULCER;  Surgeon: Emelia Loron, MD;  Location: [facility] OR;  Service: General;  Laterality: N/A;  duodenal   Family History  Problem Relation Age of Onset  . Anesthesia problems Neg Hx   . Hearing loss Neg Hx   . Breast cancer Paternal Grandmother   .  Brain cancer     History  Substance Use Topics  . Smoking status: Never Smoker   . Smokeless tobacco: Never Used  . Alcohol Use: No   OB History   Grav Para Term Preterm Abortions TAB SAB Ect Mult Living   1 1 1  0 0 0 0 0 0 1     Review of Systems  Constitutional: Positive for fever. Negative for chills.  HENT: Positive for sore throat, rhinorrhea and sinus pressure. Negative for congestion and neck stiffness.   Eyes: Negative for visual disturbance.  Respiratory: Negative for cough, chest tightness and shortness of breath.   Cardiovascular: Negative for chest pain.  Gastrointestinal: Negative for nausea, vomiting, abdominal pain, diarrhea, constipation, blood in stool and anal bleeding.  Musculoskeletal: Positive for myalgias.  Skin: Negative for rash.  Neurological: Positive for headaches. Negative for dizziness, weakness, light-headedness and numbness.  All other systems reviewed and are negative.    Allergies  Nsaids  Home Medications   Current Outpatient Rx  Name  Route  Sig  Dispense  Refill  . amoxicillin-clavulanate (AUGMENTIN) 875-125 MG per tablet   Oral   Take 1 tablet by mouth 2 (two) times daily. One po bid x 7 days   14 tablet   0   . levonorgestrel (MIRENA) 20 MCG/24HR IUD   Intrauterine   1 Intra Uterine Device (1 each total) by Intrauterine route once.   1 each   0    BP 125/76  Pulse 94  Temp(Src) 100.5 F (38.1 C) (Oral)  Resp 18  SpO2 100% Physical Exam  Nursing note and vitals reviewed. Constitutional: She is oriented to person, place, and time. She appears well-developed and well-nourished. No distress.  HENT:  Head: Normocephalic and atraumatic.  Mouth/Throat: Oropharynx is clear and moist. No oropharyngeal exudate.  Mild erythema noted to the posterior oropharynx Negative exudate noted Pain upon palpation to the frontal sinuses, negative maxillary sinuses  Eyes: Conjunctivae and EOM are normal. Pupils are equal, round, and reactive  to light. Right eye exhibits no discharge. Left eye exhibits no discharge.  Negative pain noted with EOMs   Neck: Normal range of motion. Neck supple. No tracheal deviation present.  Full ROM of neck - negative neck stiffness, negative nuchal rigidity  Pain upon palpation to the neck, anterior and posterior Mild swollen tonsils - discomfort upon palpation   Cardiovascular: Normal rate, regular rhythm and normal heart sounds.   Pulses:      Radial pulses are 2+ on the right side, and 2+ on the left side.       Dorsalis pedis pulses are 2+ on the right side, and 2+ on the left side.  Pulmonary/Chest: Effort normal and breath sounds normal. No respiratory distress. She has no wheezes. She has no rales.  Musculoskeletal: Normal range of motion.  Strength 5+/5+ to upper and lower extremities with resistance  Neurological: She is alert and oriented to person, place, and time. No cranial nerve deficit. She exhibits normal muscle tone. Coordination normal.  Skin: Skin is warm and dry. No rash noted. She is not diaphoretic. No erythema.  Psychiatric: She has a normal mood and affect. Her behavior is normal. Thought content normal.    ED Course  Procedures (including critical care time)  Medications  diphenhydrAMINE (BENADRYL) injection 25 mg (25 mg Intramuscular Given 10/22/12 0916)  dexamethasone (DECADRON) injection 10 mg (10 mg Intramuscular Given 10/22/12 0916)    Labs Reviewed  RAPID STREP SCREEN  CULTURE, GROUP A STREP   Dg Chest 2 View  10/22/2012   *RADIOLOGY REPORT*  Clinical Data: Rhinorrhea.  Weakness.  Congestion.  Sore throat. Fever.  Facial pain.  CHEST - 2 VIEW  Comparison: 09/14/2012  Findings: Cardiac and mediastinal contours appear normal.  The lungs appear clear.  No pleural effusion is identified.  IMPRESSION:  No significant abnormality identified.   Original Report Authenticated By: Gaylyn Rong, M.D.   1. Viral illness   2. Sore throat   3. Sickle cell trait      MDM  Patient presenting to the ED with sore throat, mild fever, and bodyaches that started yesterday. Negative chest pain, shortness of breath. Mild erythema noted to posterior oropharynx, without exudate. Pain upon palpation to the tonsils - mild enlargement. Full ROM to extremities with good strength with resistance. Negative full ROM, negative neck stiffness. Negative meningeal signs. Negative swelling of oropharynx, doubt peritonsillar abscess. Rapid strep test negative. Chest xray negative.  Patient mild fever noted upon arrival to ED, 100.4 - Tylenol given. Patient reported headache to improve and stated that she is ready to go home - patient feeling better. Suspicion to be viral in nature. Patient low-grade fever, stable. Patient discharged. Referred to PCP for follow-up by the beginning of this week. Discharged patient with antibiotics to not use until cleared by physician and if symptoms are to worsen. Discussed use of Tylenol to control fever. Discussed rest and hydration. Discussed with patient to monitor symptoms and if symptoms are to worsen or change to report back to the ED - strict  return instructions given. Patient agreed to plan of care, understood, all questions answered.    Raymon Mutton, PA-C 10/22/12 1956

## 2012-10-22 NOTE — ED Notes (Signed)
Pt states sore throat, runny nose and congestion. Ongoing for several days. 5/10 pain at the time. Respirations unlabored. Pt is conscious alert and oriented x4.

## 2012-10-22 NOTE — ED Notes (Signed)
Pt presents to department for evaluation of sore throat, runny nose and sinus congestion. Ongoing for several days. Respirations unlabored. 8/10 pain at the time.

## 2012-10-22 NOTE — ED Notes (Signed)
Pt refused anything to drink.

## 2012-10-24 LAB — CULTURE, GROUP A STREP

## 2012-10-24 NOTE — ED Provider Notes (Signed)
Medical screening examination/treatment/procedure(s) were performed by non-physician practitioner and as supervising physician I was immediately available for consultation/collaboration.  Sanjuanita Condrey, MD 10/24/12 0054 

## 2013-01-14 ENCOUNTER — Encounter (HOSPITAL_COMMUNITY): Payer: Self-pay | Admitting: Emergency Medicine

## 2013-01-14 ENCOUNTER — Emergency Department (HOSPITAL_COMMUNITY)
Admission: EM | Admit: 2013-01-14 | Discharge: 2013-01-14 | Disposition: A | Discharge status: Home / Self Care | Payer: Medicaid Other | Attending: Emergency Medicine | Admitting: Emergency Medicine

## 2013-01-14 DIAGNOSIS — D573 Sickle-cell trait: Secondary | ICD-10-CM | POA: Insufficient documentation

## 2013-01-14 DIAGNOSIS — Z8742 Personal history of other diseases of the female genital tract: Secondary | ICD-10-CM | POA: Insufficient documentation

## 2013-01-14 DIAGNOSIS — Z888 Allergy status to other drugs, medicaments and biological substances status: Secondary | ICD-10-CM | POA: Insufficient documentation

## 2013-01-14 DIAGNOSIS — Z8719 Personal history of other diseases of the digestive system: Secondary | ICD-10-CM | POA: Insufficient documentation

## 2013-01-14 DIAGNOSIS — M940 Chondrocostal junction syndrome [Tietze]: Secondary | ICD-10-CM

## 2013-01-14 DIAGNOSIS — Z79899 Other long term (current) drug therapy: Secondary | ICD-10-CM | POA: Insufficient documentation

## 2013-01-14 HISTORY — DX: Gastric ulcer, unspecified as acute or chronic, without hemorrhage or perforation: K25.9

## 2013-01-14 MED ORDER — TRAMADOL HCL 50 MG PO TABS: 50.0000 mg | ORAL_TABLET | Freq: Four times a day (QID) | ORAL | Status: DC | PRN

## 2013-01-14 MED ORDER — ACETAMINOPHEN 325 MG PO TABS: 650.0000 mg | ORAL_TABLET | Freq: Four times a day (QID) | ORAL | Status: DC | PRN

## 2013-01-14 NOTE — ED Provider Notes (Signed)
Medical screening examination/treatment/procedure(s) were performed by non-physician practitioner and as supervising physician I was immediately available for consultation/collaboration.   Gavin Pound. Oletta Lamas, MD 01/14/13 [phone]

## 2013-01-14 NOTE — ED Notes (Signed)
Pt brought back to room from triage; pt getting undressed and into a gown at this time; family at bedside 

## 2013-01-14 NOTE — ED Notes (Signed)
Pt c/o mid sternal CP worse with inspiration and palpation x 2 days

## 2013-01-14 NOTE — ED Notes (Signed)
Patient discharged to home with family. NAD.  

## 2013-01-14 NOTE — ED Provider Notes (Signed)
CSN: [id number]     Arrival date & time 01/14/13  7829 History   First MD Initiated Contact with Patient 01/14/13 [phone]     Chief Complaint  Patient presents with  . Chest Pain   (Consider location/radiation/quality/duration/timing/severity/associated sxs/prior Treatment) HPI Comments: Patient with history of perforated gastric ulcer -- presents with chest pain in the middle of her chest described as sharp and a pressure for the past 2 days. Pain is made worse with movement and by pressing on the chest. No treatments prior to arrival. She does not have any difficulty breathing. The pain is worse when she takes a deep breath. She denies injury to this area. She has a 15 pound child at home which she lifts very frequently. She has not had any fever, upper respiratory tract infection symptoms, cough, nausea, vomiting, abdominal pain. She is not a smoker. She's not had any recent surgery or mobilizations. No recent long car or plane rides. She does not have a history of blood clots. She has a Mirena IUD. Onset of symptoms gradual. Course is constant.  Patient is a 22 y.o. female presenting with chest pain. The history is provided by the patient.  Chest Pain Associated symptoms: no abdominal pain, no back pain, no cough, no diaphoresis, no fever, no nausea, no palpitations, no shortness of breath and not vomiting     Past Medical History  Diagnosis Date  . Hypoglycemia   . Ovarian cyst 2011  . Constipation   . Sickle cell trait   . History of dysmenorrhea   . Gastric ulcer    Past Surgical History  Procedure Laterality Date  . Laparoscopic ovarian cystectomy  2011  . Eye surgery      2004  . Laparotomy N/A 06/16/2012    Procedure: EXPLORATORY LAPAROTOMY;  Surgeon: Emelia Loron, MD;  Location: [facility] OR;  Service: General;  Laterality: N/A;  Repair of duodenal ulcer  . Repair of perforated ulcer N/A 06/16/2012    Procedure: REPAIR OF PERFORATED ULCER;  Surgeon: Emelia Loron, MD;   Location: [facility] OR;  Service: General;  Laterality: N/A;  duodenal   Family History  Problem Relation Age of Onset  . Anesthesia problems Neg Hx   . Hearing loss Neg Hx   . Breast cancer Paternal Grandmother   . Brain cancer     History  Substance Use Topics  . Smoking status: Never Smoker   . Smokeless tobacco: Never Used  . Alcohol Use: No   OB History   Grav Para Term Preterm Abortions TAB SAB Ect Mult Living   1 1 1  0 0 0 0 0 0 1     Review of Systems  Constitutional: Negative for fever and diaphoresis.  HENT: Negative for neck pain.   Eyes: Negative for redness.  Respiratory: Negative for cough and shortness of breath.   Cardiovascular: Positive for chest pain. Negative for palpitations and leg swelling.  Gastrointestinal: Negative for nausea, vomiting and abdominal pain.  Genitourinary: Negative for dysuria.  Musculoskeletal: Negative for back pain.  Skin: Negative for rash.  Neurological: Negative for syncope and light-headedness.    Allergies  Nsaids  Home Medications   Current Outpatient Rx  Name  Route  Sig  Dispense  Refill  . levonorgestrel (MIRENA) 20 MCG/24HR IUD   Intrauterine   1 Intra Uterine Device (1 each total) by Intrauterine route once.   1 each   0   . acetaminophen (TYLENOL) 325 MG tablet   Oral   Take  2 tablets (650 mg total) by mouth every 6 (six) hours as needed for pain.   20 tablet   0   . traMADol (ULTRAM) 50 MG tablet   Oral   Take 1 tablet (50 mg total) by mouth every 6 (six) hours as needed for pain.   15 tablet   0    BP 108/69  Pulse 85  Temp(Src) 98.4 F (36.9 C) (Oral)  Resp 18  Ht 5\' 3"  (1.6 m)  Wt 132 lb (59.875 kg)  BMI 23.39 kg/m2  SpO2 96% Physical Exam  Nursing note and vitals reviewed. Constitutional: She appears well-developed and well-nourished.  HENT:  Head: Normocephalic and atraumatic.  Mouth/Throat: Mucous membranes are normal. Mucous membranes are not dry.  Eyes: Conjunctivae are normal.  Neck:  Trachea normal and normal range of motion. Neck supple. Normal carotid pulses and no JVD present. No muscular tenderness present. Carotid bruit is not present. No tracheal deviation present.  Cardiovascular: Normal rate, regular rhythm, S1 normal, S2 normal, normal heart sounds and intact distal pulses.  Exam reveals no decreased pulses.   No murmur heard. Pulmonary/Chest: Effort normal. No respiratory distress. She has no wheezes. She exhibits tenderness (over sternum).    Abdominal: Soft. Normal aorta and bowel sounds are normal. There is no tenderness. There is no rebound and no guarding.  Musculoskeletal: Normal range of motion.  Neurological: She is alert.  Skin: Skin is warm and dry. She is not diaphoretic. No cyanosis. No pallor.  Psychiatric: She has a normal mood and affect.    ED Course  Procedures (including critical care time) Labs Review Labs Reviewed - No data to display Imaging Review No results found.  9:42 AM Patient seen and examined.   Vital signs reviewed and are as follows: Filed Vitals:   01/14/13 0903  BP: 108/69  Pulse: 85  Temp: 98.4 F (36.9 C)  Resp: 18    Date: 01/14/2013  Rate: 76  Rhythm: normal sinus rhythm  QRS Axis: normal  Intervals: normal  ST/T Wave abnormalities: nonspecific T wave changes  Conduction Disutrbances:none  Narrative Interpretation: inverted t-waves inferiorly unchanged  Old EKG Reviewed: unchanged from 09/14/12  Patient was counseled to return with severe chest pain, especially if the pain is crushing or pressure-like and spreads to the arms, back, neck, or jaw, or if they have sweating, nausea, or shortness of breath with the pain. They were encouraged to call 911 with these symptoms.   They were also told to return if their chest pain gets worse and does not go away with rest, they have an attack of chest pain lasting longer than usual despite rest and treatment with the medications their caregiver has prescribed, if they  wake from sleep with chest pain or shortness of breath, if they feel dizzy or faint, if they have chest pain not typical of their usual pain, or if they have any other emergent concerns regarding their health.  The patient verbalized understanding and agreed.     MDM   1. Costochondritis, acute    Patient with reproducible chest pain with palpation over sternum. Feel this is most likely costochondritis. Patient lifts her small child multiple times a day and this has likely led to her current pain. Patient has a Mirena IUD, however no other risk factors for PE. I feel this patient is very low risk for PE do not feel that the risk of sending a d-dimer is worth the benefit. No suspicion for ACS. Patient appears  well, in no respiratory distress.    Renne Crigler, PA-C 01/14/13 [phone]

## 2013-01-27 ENCOUNTER — Inpatient Hospital Stay (HOSPITAL_COMMUNITY): Payer: Medicaid Other

## 2013-01-27 ENCOUNTER — Inpatient Hospital Stay (HOSPITAL_COMMUNITY)
Admission: AD | Admit: 2013-01-27 | Discharge: 2013-01-28 | Disposition: A | Discharge status: Home / Self Care | Payer: Medicaid Other | Source: Ambulatory Visit | Attending: Obstetrics & Gynecology | Admitting: Obstetrics & Gynecology

## 2013-01-27 ENCOUNTER — Encounter (HOSPITAL_COMMUNITY): Payer: Self-pay | Admitting: *Deleted

## 2013-01-27 DIAGNOSIS — N83209 Unspecified ovarian cyst, unspecified side: Secondary | ICD-10-CM | POA: Insufficient documentation

## 2013-01-27 DIAGNOSIS — R1032 Left lower quadrant pain: Secondary | ICD-10-CM | POA: Insufficient documentation

## 2013-01-27 DIAGNOSIS — N83202 Unspecified ovarian cyst, left side: Secondary | ICD-10-CM

## 2013-01-27 DIAGNOSIS — Z30431 Encounter for routine checking of intrauterine contraceptive device: Secondary | ICD-10-CM | POA: Insufficient documentation

## 2013-01-27 LAB — CBC
HCT: 36.5 % (ref 36.0–46.0)
Hemoglobin: 12.9 g/dL (ref 12.0–15.0)
MCH: 26.9 pg (ref 26.0–34.0)
RBC: 4.8 MIL/uL (ref 3.87–5.11)

## 2013-01-27 LAB — URINALYSIS, ROUTINE W REFLEX MICROSCOPIC
Bilirubin Urine: NEGATIVE
Glucose, UA: NEGATIVE mg/dL
Hgb urine dipstick: NEGATIVE
Ketones, ur: NEGATIVE mg/dL
Nitrite: NEGATIVE
Specific Gravity, Urine: 1.025 (ref 1.005–1.030)
pH: 6 (ref 5.0–8.0)

## 2013-01-27 LAB — URINE MICROSCOPIC-ADD ON

## 2013-01-27 MED ORDER — HYDROMORPHONE HCL PF 1 MG/ML IJ SOLN
0.5000 mg | Freq: Once | INTRAMUSCULAR | Status: AC
  Administered 2013-01-27: 0.5 mg via INTRAMUSCULAR
  Filled 2013-01-27: qty 1

## 2013-01-27 NOTE — MAU Note (Signed)
Pt reports she has a marena in (had baby 8 months ago). Started feeling nauseated like she did when she got pregnant last time. Took HPT and it was positive. Reports having some cramping .

## 2013-01-27 NOTE — MAU Provider Note (Signed)
History     CSN: [id number]  Arrival date and time: 01/27/13 2030   None     Chief Complaint  Patient presents with  . Possible Pregnancy   HPI This is a 22 y.o. female who presents with c/o nausea and cramping. States UPT at home was positive. Has Mirena IUD in place x 8 mos   Periods are regular and light. Is worried it is not working. States had one negative pregnancy test at home and one where she saw two lines.  States has some intermittent sharp pains in abdomen. Points to all over abdomen. States has had sharp LLQ pain for over a year.    History is remarkable for surgery to repair perforated ulcer in February, only a month after having her baby.    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 Started feeling nauseated like she did when she got pregnant last time. Took HPT and it was positive. Reports having some cramping .    OB History   Grav Para Term Preterm Abortions TAB SAB Ect Mult Living   1 1 1  0 0 0 0 0 0 1      Past Medical History  Diagnosis Date  . Hypoglycemia   . Ovarian cyst 2011  . Constipation   . Sickle cell trait   . History of dysmenorrhea   . Gastric ulcer     Past Surgical History  Procedure Laterality Date  . Laparoscopic ovarian cystectomy  2011  . Eye surgery      2004  . Laparotomy N/A 06/16/2012    Procedure: EXPLORATORY LAPAROTOMY;  Surgeon: Emelia Loron, MD;  [facility] OR;  Service: General;  Laterality: N/A;  Repair of duodenal ulcer  . Repair of perforated ulcer N/A 06/16/2012    Procedure: REPAIR OF PERFORATED ULCER;  Surgeon: Emelia Loron, MD;  Location: [facility] OR;  Service: General;  Laterality: N/A;  duodenal    Family History  Problem Relation Age of Onset  . Anesthesia problems Neg Hx   . Hearing loss Neg Hx   . Breast cancer Paternal Grandmother   . Brain cancer      History  Substance Use Topics  . Smoking status: Never Smoker   . Smokeless tobacco: Never Used  . Alcohol  Use: No    Allergies:  Allergies  Allergen Reactions  . Nsaids     Diagnosed with a perforated duodenal ulcer due to NSAIDS    Prescriptions prior to admission  Medication Sig Dispense Refill  . acetaminophen (TYLENOL) 325 MG tablet Take 2 tablets (650 mg total) by mouth every 6 (six) hours as needed for pain.  20 tablet  0  . traMADol (ULTRAM) 50 MG tablet Take 1 tablet (50 mg total) by mouth every 6 (six) hours as needed for pain.  15 tablet  0  . levonorgestrel (MIRENA) 20 MCG/24HR IUD 1 Intra Uterine Device (1 each total) by Intrauterine route once.  1 each  0    Review of Systems  Constitutional: Negative for fever and chills.  Gastrointestinal: Positive for nausea, vomiting and abdominal pain. Negative for diarrhea and constipation.  Genitourinary: Negative for dysuria.  Neurological: Negative for dizziness.   Physical Exam   Blood pressure 134/69, pulse 89, resp. rate 89, height 5\' 3"  (1.6 m), weight 60.238 kg (132 lb 12.8 oz), last menstrual period 12/26/2012.  Physical Exam  Constitutional: She is oriented to person, place, and  time. She appears well-developed and well-nourished. No distress.  HENT:  Head: Normocephalic.  Cardiovascular: Normal rate.   Respiratory: Effort normal.  GI: Soft. She exhibits no distension and no mass. There is no tenderness (very tender LLQ. No rebound.  States LLQ has been painful for over a year.). There is no rebound and no guarding.  Genitourinary:  Declines STD testing  Musculoskeletal: Normal range of motion.  Neurological: She is alert and oriented to person, place, and time.  Skin: Skin is warm and dry.  Psychiatric: She has a normal mood and affect.    MAU Course  Procedures  MDM Will check CBC and quant.  Will check [place] to rule out cyst. Results for orders placed during the hospital encounter of 01/27/13 (from the past 24 hour(s))  URINALYSIS, ROUTINE W REFLEX MICROSCOPIC     Status: Abnormal   Collection Time     01/27/13  8:53 PM      Result Value Range   Color, Urine YELLOW  YELLOW   APPearance CLEAR  CLEAR   Specific Gravity, Urine 1.025  1.005 - 1.030   pH 6.0  5.0 - 8.0   Glucose, UA NEGATIVE  NEGATIVE mg/dL   Hgb urine dipstick NEGATIVE  NEGATIVE   Bilirubin Urine NEGATIVE  NEGATIVE   Ketones, ur NEGATIVE  NEGATIVE mg/dL   Protein, ur NEGATIVE  NEGATIVE mg/dL   Urobilinogen, UA 0.2  0.0 - 1.0 mg/dL   Nitrite NEGATIVE  NEGATIVE   Leukocytes, UA SMALL (*) NEGATIVE  URINE MICROSCOPIC-ADD ON     Status: Abnormal   Collection Time    01/27/13  8:53 PM      Result Value Range   Squamous Epithelial / LPF FEW (*) RARE   WBC, UA 11-20  <3 WBC/hpf   RBC / HPF 3-6  <3 RBC/hpf   Bacteria, UA FEW (*) RARE  HCG, QUANTITATIVE, PREGNANCY     Status: None   Collection Time    01/27/13  9:13 PM      Result Value Range   hCG, Beta Chain, Quant, S <1  <5 mIU/mL  CBC     Status: Abnormal   Collection Time    01/27/13  9:13 PM      Result Value Range   WBC 8.8  4.0 - 10.5 K/uL   RBC 4.80  3.87 - 5.11 MIL/uL   Hemoglobin 12.9  12.0 - 15.0 g/dL   HCT 40.9  81.1 - 91.4 %   MCV 76.0 (*) 78.0 - 100.0 fL   MCH 26.9  26.0 - 34.0 pg   MCHC 35.3  30.0 - 36.0 g/dL   RDW 78.2  95.6 - 21.3 %   Platelets 246  150 - 400 K/uL   US Pelvis Complete  01/28/2013   CLINICAL DATA:  Left lower quadrant pelvic pain  EXAM: TRANSABDOMINAL AND TRANSVAGINAL ULTRASOUND OF PELVIS  TECHNIQUE: Both transabdominal and transvaginal ultrasound examinations of the pelvis were performed. Transabdominal technique was performed for global imaging of the pelvis including uterus, ovaries, adnexal regions, and pelvic cul-de-sac. It was necessary to proceed with endovaginal exam following the transabdominal exam to visualize the endometrium and ovaries.  COMPARISON:  None  FINDINGS: Uterus  Measurements: 8.7 x 5.6 x 4.6 cm. No focal abnormality.  Endometrium  Thickness: 5 mm. Uniformly echogenic. Trace fluid within the endometrial canal.  IUD is inappropriately low within the lower uterine segment/cervical endometrial canal. No focal endometrial abnormality.  Right ovary  Measurements: 2.8 x 2.5  x 1.7 cm. Normal. Normal appearance/no adnexal mass.  Left ovary  Measurements: 4.9 x 3.3 x 2.1 cm. Probable collapsing physiologic cyst measuring 3.2 x 2.5 x 2.0 cm.  Other findings  Trace free fluid in the cul-de-sac.    IMPRESSION: Probable collapsing left ovarian physiologic cyst which could account for the history of left lower quadrant pain.  IUD inappropriately located within the lower uterine segment/ cervical endometrial canal. This is of unknown clinical significance but could correlate with decreased contraception reliability.     Electronically Signed   By: Christiana Pellant M.D.   On: 01/28/2013 00:08    Assessment and Plan  A:  Not pregnant      LLQ pain, probably due to collapsing left ovarian cyst      No leukocytosis      Displaced IUD  P:  Discharge home       Encouraged her to make appt with Dr [name] (she owes them money) or [facility] to [facility]UD       Tramadol for pain, limited Rx #20      Note routed to Dr [name] also, incase patient decides to go back there  [facility] 01/27/2013, 9:14 PM

## 2013-01-28 DIAGNOSIS — N83209 Unspecified ovarian cyst, unspecified side: Secondary | ICD-10-CM

## 2013-01-28 MED ORDER — TRAMADOL HCL 50 MG PO TABS: 50.0000 mg | ORAL_TABLET | Freq: Four times a day (QID) | ORAL | Status: DC | PRN

## 2013-01-29 LAB — URINE CULTURE

## 2013-03-05 ENCOUNTER — Encounter (HOSPITAL_COMMUNITY): Payer: Self-pay | Admitting: Emergency Medicine

## 2013-03-05 ENCOUNTER — Emergency Department (HOSPITAL_COMMUNITY)
Admission: EM | Admit: 2013-03-05 | Discharge: 2013-03-05 | Disposition: A | Discharge status: Home / Self Care | Payer: Medicaid Other | Attending: Emergency Medicine | Admitting: Emergency Medicine

## 2013-03-05 DIAGNOSIS — Z8719 Personal history of other diseases of the digestive system: Secondary | ICD-10-CM | POA: Insufficient documentation

## 2013-03-05 DIAGNOSIS — S90569A Insect bite (nonvenomous), unspecified ankle, initial encounter: Secondary | ICD-10-CM | POA: Insufficient documentation

## 2013-03-05 DIAGNOSIS — Y929 Unspecified place or not applicable: Secondary | ICD-10-CM | POA: Insufficient documentation

## 2013-03-05 DIAGNOSIS — W57XXXA Bitten or stung by nonvenomous insect and other nonvenomous arthropods, initial encounter: Secondary | ICD-10-CM

## 2013-03-05 DIAGNOSIS — Y939 Activity, unspecified: Secondary | ICD-10-CM | POA: Insufficient documentation

## 2013-03-05 DIAGNOSIS — Z862 Personal history of diseases of the blood and blood-forming organs and certain disorders involving the immune mechanism: Secondary | ICD-10-CM | POA: Insufficient documentation

## 2013-03-05 DIAGNOSIS — Z8742 Personal history of other diseases of the female genital tract: Secondary | ICD-10-CM | POA: Insufficient documentation

## 2013-03-05 DIAGNOSIS — J3489 Other specified disorders of nose and nasal sinuses: Secondary | ICD-10-CM | POA: Insufficient documentation

## 2013-03-05 DIAGNOSIS — Z8639 Personal history of other endocrine, nutritional and metabolic disease: Secondary | ICD-10-CM | POA: Insufficient documentation

## 2013-03-05 MED ORDER — DOXYCYCLINE HYCLATE 100 MG PO CAPS: 100.0000 mg | ORAL_CAPSULE | Freq: Two times a day (BID) | ORAL | Status: DC

## 2013-03-05 NOTE — ED Notes (Signed)
Pt c/o possible insect bite to right ankle; no obvious mark noted but pt sts some drainage from site

## 2013-03-05 NOTE — ED Provider Notes (Signed)
CSN: [id number]     Arrival date & time 03/05/13  1037 History  This chart was scribed for non-physician practitioner Roxy Horseman, PA-C working with Flint Melter, MD by Leone Payor, ED Scribe. This patient was seen in room TR09C/TR09C and the patient's care was started at 1037.    Chief Complaint  Patient presents with  . Insect Bite    The history is provided by the patient. No language interpreter was used.    HPI Comments: [name] is a 22 y.o. female who presents to the Emergency Department complaining of a spider bite to the right ankle that occurred 3 days ago. Pt states she witnessed the bite occur and slapped the spider away. She states it was a small, black spider. Pt states she noticed the area has increased in size and began to have mild discharge from the area today. Pt reports having some rhinorrhea. She denies cough, sore throat. Pt is not breast-feeding at this time.   Past Medical History  Diagnosis Date  . Hypoglycemia   . Ovarian cyst 2011  . Constipation   . Sickle cell trait   . History of dysmenorrhea   . Gastric ulcer    Past Surgical History  Procedure Laterality Date  . Laparoscopic ovarian cystectomy  2011  . Eye surgery      2004  . Laparotomy N/A 06/16/2012    Procedure: EXPLORATORY LAPAROTOMY;  Surgeon: Emelia Loron, MD;  Location: [facility] OR;  Service: General;  Laterality: N/A;  Repair of duodenal ulcer  . Repair of perforated ulcer N/A 06/16/2012    Procedure: REPAIR OF PERFORATED ULCER;  Surgeon: Emelia Loron, MD;  Location: [facility] OR;  Service: General;  Laterality: N/A;  duodenal   Family History  Problem Relation Age of Onset  . Anesthesia problems Neg Hx   . Hearing loss Neg Hx   . Breast cancer Paternal Grandmother   . Brain cancer     History  Substance Use Topics  . Smoking status: Never Smoker   . Smokeless tobacco: Never Used  . Alcohol Use: No   OB History   Grav Para Term Preterm Abortions TAB SAB Ect Mult  Living   1 1 1  0 0 0 0 0 0 1     Review of Systems A complete 10 system review of systems was obtained and all systems are negative except as noted in the HPI and PMH.   Allergies  Nsaids  Home Medications   Current Outpatient Rx  Name  Route  Sig  Dispense  Refill  . traMADol (ULTRAM) 50 MG tablet   Oral   Take 50 mg by mouth every 6 (six) hours as needed for moderate pain.         Marland Kitchen levonorgestrel (MIRENA) 20 MCG/24HR IUD   Intrauterine   1 Intra Uterine Device (1 each total) by Intrauterine route once.   1 each   0    BP 127/81  Pulse 88  Temp(Src) 98.4 F (36.9 C) (Oral)  Resp 16  SpO2 100% Physical Exam  Nursing note and vitals reviewed. Constitutional: She is oriented to person, place, and time. She appears well-developed and well-nourished.  HENT:  Head: Normocephalic and atraumatic.  Cardiovascular: Normal rate.   Intact distal pulses with brisk cap refill.   Pulmonary/Chest: Effort normal.  Abdominal: She exhibits no distension.  Musculoskeletal:  Ankle ROM and strength 5/5.   Neurological: She is alert and oriented to person, place, and time.  Skin:  Skin is warm and dry.  Right anterior ankle remarkable for a 1.5 cm diameter area of very subtle erythema. Mildly fluctuant. No discharge.   Psychiatric: She has a normal mood and affect.    ED Course  Procedures   DIAGNOSTIC STUDIES: Oxygen Saturation is 100% on RA, normal by my interpretation.    COORDINATION OF CARE: 11:27 AM Discussed treatment plan with pt at bedside and pt agreed to plan. Pt requests have the area drained today. Will [place] to ensure there is fluid to be drained.   11:32 AM Did not find significant fluid on [place]. Pt understands that it will not be appropriate to I&D at this time. Will prescribe antibiotics at this time. Given return precautions.   Labs Review Labs Reviewed - No data to display Imaging Review No results found.  EKG Interpretation   None       MDM   1.  Insect bite    Patient with insect bite. Will give doxycycline. No fevers, no signs of abscess. Plan primary care followup, or return for worsening symptoms.  I personally performed the services described in this documentation, which was scribed in my presence. The recorded information has been reviewed and is accurate.    Roxy Horseman, PA-C 03/05/13 1147

## 2013-03-05 NOTE — ED Provider Notes (Signed)
Medical screening examination/treatment/procedure(s) were performed by non-physician practitioner and as supervising physician I was immediately available for consultation/collaboration.  Sigismund Cross L Verda Mehta, MD 03/05/13 1622 

## 2013-04-11 ENCOUNTER — Inpatient Hospital Stay (HOSPITAL_COMMUNITY)
Admission: AD | Admit: 2013-04-11 | Discharge: 2013-04-11 | Disposition: A | Discharge status: Home / Self Care | Payer: Self-pay | Source: Ambulatory Visit | Attending: Obstetrics & Gynecology | Admitting: Obstetrics & Gynecology

## 2013-04-11 ENCOUNTER — Encounter (HOSPITAL_COMMUNITY): Payer: Self-pay | Admitting: Family

## 2013-04-11 DIAGNOSIS — R102 Pelvic and perineal pain: Secondary | ICD-10-CM

## 2013-04-11 DIAGNOSIS — Z30432 Encounter for removal of intrauterine contraceptive device: Secondary | ICD-10-CM | POA: Insufficient documentation

## 2013-04-11 DIAGNOSIS — R109 Unspecified abdominal pain: Secondary | ICD-10-CM | POA: Insufficient documentation

## 2013-04-11 DIAGNOSIS — N949 Unspecified condition associated with female genital organs and menstrual cycle: Secondary | ICD-10-CM | POA: Insufficient documentation

## 2013-04-11 LAB — URINALYSIS, ROUTINE W REFLEX MICROSCOPIC
Bilirubin Urine: NEGATIVE
Hgb urine dipstick: NEGATIVE
Leukocytes, UA: NEGATIVE
Nitrite: NEGATIVE
Specific Gravity, Urine: 1.02 (ref 1.005–1.030)
pH: 6 (ref 5.0–8.0)

## 2013-04-11 MED ORDER — MEDROXYPROGESTERONE ACETATE 150 MG/ML IM SUSP
150.0000 mg | Freq: Once | INTRAMUSCULAR | Status: AC
  Administered 2013-04-11: 150 mg via INTRAMUSCULAR
  Filled 2013-04-11: qty 1

## 2013-04-11 MED ORDER — KETOROLAC TROMETHAMINE 60 MG/2ML IM SOLN
60.0000 mg | Freq: Once | INTRAMUSCULAR | Status: AC
  Administered 2013-04-11: 60 mg via INTRAMUSCULAR
  Filled 2013-04-11: qty 2

## 2013-04-11 NOTE — MAU Note (Addendum)
23 yo, G1P1 with Mirena x 10 months, presents to MAU with c/o lower abdominal cramping. Reports sensation of pins and needles in pelvis. Reports nausea x several weeks, emesis x 3 yesterday at work. Denies sick contacts.  Denies vaginal discharge changes. Reports she can no longer feel IUD strings.

## 2013-04-11 NOTE — MAU Provider Note (Signed)
  History     CSN: [id number]  Arrival date and time: 04/11/13 1245   First Provider Initiated Contact with Patient 04/11/13 1331      Chief Complaint  Patient presents with  . Abdominal Pain   HPI Comments: [name] 22 y.o. G1P1001 presents to MAU for pelvic pain ongoing since yesterday. She was seen on 01/27/13 for similar complaint and had pelvic ultrasound which showed the IUD in the lower uterine segment/ cervical canal.  She is willing to have IUD pulled today, take depo injection and follow up at [facility] for reinsertion of IUD.   Abdominal Pain      Past Medical History  Diagnosis Date  . Hypoglycemia   . Ovarian cyst 2011  . Constipation   . Sickle cell trait   . History of dysmenorrhea   . Gastric ulcer     Past Surgical History  Procedure Laterality Date  . Laparoscopic ovarian cystectomy  2011  . Eye surgery      2004  . Laparotomy N/A 06/16/2012    Procedure: EXPLORATORY LAPAROTOMY;  Surgeon: Emelia Loron, MD;  Location: [facility] OR;  Service: General;  Laterality: N/A;  Repair of duodenal ulcer  . Repair of perforated ulcer N/A 06/16/2012    Procedure: REPAIR OF PERFORATED ULCER;  Surgeon: Emelia Loron, MD;  Location: [facility] OR;  Service: General;  Laterality: N/A;  duodenal    Family History  Problem Relation Age of Onset  . Anesthesia problems Neg Hx   . Hearing loss Neg Hx   . Breast cancer Paternal Grandmother   . Brain cancer      History  Substance Use Topics  . Smoking status: Never Smoker   . Smokeless tobacco: Never Used  . Alcohol Use: No    Allergies:  Allergies  Allergen Reactions  . Nsaids     Diagnosed with a perforated duodenal ulcer due to NSAIDS    No prescriptions prior to admission    Review of Systems  Constitutional: Negative.   HENT: Negative.   Eyes: Negative.   Respiratory: Negative.   Cardiovascular: Negative.   Gastrointestinal: Positive for abdominal pain.  Genitourinary: Negative.   Skin:  Negative.   Neurological: Negative.   Psychiatric/Behavioral: Negative.    Physical Exam   Blood pressure 123/78, pulse 81, temperature 97.9 F (36.6 C), temperature source Oral, resp. rate 16, last menstrual period 03/26/2013.  Physical Exam  Constitutional: She is oriented to person, place, and time. She appears well-developed and well-nourished.  HENT:  Head: Normocephalic and atraumatic.  Eyes: Pupils are equal, round, and reactive to light.  Neck: Normal range of motion.  GI: Soft. There is tenderness.  Genitourinary:  Genitalia: External: negative Vaginal: negative Cervix: IUD strings seen/ IUD removed without difficulty   Neurological: She is alert and oriented to person, place, and time.  Skin: Skin is warm and dry.  Psychiatric: She has a normal mood and affect. Her behavior is normal. Judgment and thought content normal.    MAU Course  Procedures  MDM  Toradol 60 mg IM now Depo Provera 150 mg IM now  Assessment and Plan   A: Pelvic pain/ related to improper placement of IUD  P: Toradol now for pain IUD pulled Depo Provera to prevent pregnancy until new IUD inserted Follow up with [facility] for reinsertion Mirenia IUD  Carolynn Serve 04/11/2013, 1:38 PM

## 2013-05-24 ENCOUNTER — Emergency Department (HOSPITAL_COMMUNITY)
Admission: EM | Admit: 2013-05-24 | Discharge: 2013-05-24 | Disposition: A | Discharge status: Home / Self Care | Payer: Medicaid Other | Attending: Emergency Medicine | Admitting: Emergency Medicine

## 2013-05-24 ENCOUNTER — Encounter (HOSPITAL_COMMUNITY): Payer: Self-pay | Admitting: Emergency Medicine

## 2013-05-24 DIAGNOSIS — Z8639 Personal history of other endocrine, nutritional and metabolic disease: Secondary | ICD-10-CM | POA: Insufficient documentation

## 2013-05-24 DIAGNOSIS — R220 Localized swelling, mass and lump, head: Secondary | ICD-10-CM | POA: Insufficient documentation

## 2013-05-24 DIAGNOSIS — Z862 Personal history of diseases of the blood and blood-forming organs and certain disorders involving the immune mechanism: Secondary | ICD-10-CM | POA: Insufficient documentation

## 2013-05-24 DIAGNOSIS — J029 Acute pharyngitis, unspecified: Secondary | ICD-10-CM | POA: Insufficient documentation

## 2013-05-24 DIAGNOSIS — Z8742 Personal history of other diseases of the female genital tract: Secondary | ICD-10-CM | POA: Insufficient documentation

## 2013-05-24 DIAGNOSIS — R109 Unspecified abdominal pain: Secondary | ICD-10-CM | POA: Insufficient documentation

## 2013-05-24 DIAGNOSIS — Z8719 Personal history of other diseases of the digestive system: Secondary | ICD-10-CM | POA: Insufficient documentation

## 2013-05-24 DIAGNOSIS — Z8711 Personal history of peptic ulcer disease: Secondary | ICD-10-CM | POA: Insufficient documentation

## 2013-05-24 DIAGNOSIS — L230 Allergic contact dermatitis due to metals: Secondary | ICD-10-CM | POA: Insufficient documentation

## 2013-05-24 DIAGNOSIS — R221 Localized swelling, mass and lump, neck: Secondary | ICD-10-CM

## 2013-05-24 DIAGNOSIS — L258 Unspecified contact dermatitis due to other agents: Secondary | ICD-10-CM

## 2013-05-24 LAB — RAPID STREP SCREEN (MED CTR MEBANE ONLY): Streptococcus, Group A Screen (Direct): NEGATIVE

## 2013-05-24 MED ORDER — DEXAMETHASONE 6 MG PO TABS
10.0000 mg | ORAL_TABLET | Freq: Once | ORAL | Status: AC
  Administered 2013-05-24: 16:00:00 10 mg via ORAL
  Filled 2013-05-24: qty 1

## 2013-05-24 MED ORDER — OXYCODONE-ACETAMINOPHEN 5-325 MG PO TABS: 1.0000 | ORAL_TABLET | ORAL | Status: DC | PRN

## 2013-05-24 MED ORDER — OXYCODONE-ACETAMINOPHEN 5-325 MG PO TABS
1.0000 | ORAL_TABLET | Freq: Once | ORAL | Status: AC
  Administered 2013-05-24: 1 via ORAL
  Filled 2013-05-24: qty 1

## 2013-05-24 NOTE — ED Notes (Signed)
Pt discharged.Vital signs stable and GCS 15.Pt reassured that pain will decrease as the pain medication starts working.

## 2013-05-24 NOTE — ED Provider Notes (Signed)
Medical screening examination/treatment/procedure(s) were performed by non-physician practitioner and as supervising physician I was immediately available for consultation/collaboration.  EKG Interpretation   None         Charles B. Bernette MayersSheldon, MD 05/24/13 1459

## 2013-05-24 NOTE — ED Provider Notes (Signed)
CSN: [id number]     Arrival date & time 05/24/13  1349 History   First MD Initiated Contact with Patient 05/24/13 1350     Chief Complaint  Patient presents with  . Sore Throat  . Abdominal Pain   (Consider location/radiation/quality/duration/timing/severity/associated sxs/prior Treatment) HPI  23 year old female with sore throat. Woke up with it this morning. Pain is constant and worse with swallowing. She is also complaining of a painful lump in the back of her neck. This has waxed and waned in size over the past month. No fevers or chills. No cough. No abdominal pain. No urinary complaints. No interventions prior to arrival.  Past Medical History  Diagnosis Date  . Hypoglycemia   . Ovarian cyst 2011  . Constipation   . Sickle cell trait   . History of dysmenorrhea   . Gastric ulcer    Past Surgical History  Procedure Laterality Date  . Laparoscopic ovarian cystectomy  2011  . Eye surgery      2004  . Laparotomy N/A 06/16/2012    Procedure: EXPLORATORY LAPAROTOMY;  Surgeon: Emelia LoronMatthew Wakefield, MD;  [facility];  Service: General;  Laterality: N/A;  Repair of duodenal ulcer  . Repair of perforated ulcer N/A 06/16/2012    Procedure: REPAIR OF PERFORATED ULCER;  Surgeon: Emelia LoronMatthew Wakefield, MD;  [facility];  Service: General;  Laterality: N/A;  duodenal   Family History  Problem Relation Age of Onset  . Anesthesia problems Neg Hx   . Hearing loss Neg Hx   . Breast cancer Paternal Grandmother   . Brain cancer     History  Substance Use Topics  . Smoking status: Never Smoker   . Smokeless tobacco: Never Used  . Alcohol Use: No   OB History   Grav Para Term Preterm Abortions TAB SAB Ect Mult Living   1 1 1  0 0 0 0 0 0 1     Review of Systems  All systems reviewed and negative, other than as noted in HPI.   Allergies  Nsaids  Home Medications   Current Outpatient Rx  Name  Route  Sig  Dispense  Refill  . Ibuprofen-Diphenhydramine Cit (ADVIL PM PO)    Oral   Take 1-2 tablets by mouth at bedtime as needed (for pain/sleep).         Marland Kitchen. oxyCODONE-acetaminophen (PERCOCET/ROXICET) 5-325 MG per tablet   Oral   Take 1 tablet by mouth every 4 (four) hours as needed for severe pain.   8 tablet   0    BP 129/75  Pulse 86  Temp(Src) 98.6 F (37 C) (Oral)  Resp 16  Ht 5\' 3"  (1.6 m)  Wt 136 lb (61.689 kg)  BMI 24.10 kg/m2  SpO2 100%  Breastfeeding? No Physical Exam  Nursing note and vitals reviewed. Constitutional: She appears well-developed and well-nourished. No distress.  HENT:  Head: Normocephalic and atraumatic.  Patient with a patches of dry scaly skin and some mild erythema along the back of her neck in the distribution of her overlying necklace. She had a tender mobile mass left posterior neck at the level of approximately C6. Overlying skin changes. No fluctuance.  Eyes: Conjunctivae are normal. Right eye exhibits no discharge. Left eye exhibits no discharge.  Neck: Neck supple.  Cardiovascular: Normal rate, regular rhythm and normal heart sounds.  Exam reveals no gallop and no friction rub.   No murmur heard. Pulmonary/Chest: Effort normal and breath sounds normal. No respiratory distress.  Abdominal: Soft. She  exhibits no distension. There is no tenderness.  Musculoskeletal: She exhibits no edema and no tenderness.  Neurological: She is alert.  Skin: Skin is warm and dry.  Psychiatric: She has a normal mood and affect. Her behavior is normal. Thought content normal.    ED Course  Procedures (including critical care time) Labs Review Labs Reviewed  RAPID STREP SCREEN  CULTURE, GROUP A STREP   Imaging Review No results found.  EKG Interpretation   None       MDM   1. Pharyngitis, acute   2. Contact dermatitis due to jewelry      Likely viral pahryngitis. No evidence of SBI or airway compromise. Symptomatic tx. Pt wearing a large amount of jewelry. Rash to back of neck in pattern of how a necklace would lay.  Suspect contact dermatitis.   Raeford Razor, MD 05/28/13 [phone]

## 2013-05-24 NOTE — ED Notes (Signed)
Pt reports waking this am with a sore throat and painful swallowing. Pt also reports a "lump" on the back of her neck for 1 month

## 2013-05-24 NOTE — ED Provider Notes (Signed)
MSE was initiated and I personally evaluated the patient and placed orders (if any) at  2:18 PM on May 24, 2013.  Medications - No data to display  Results for orders placed during the hospital encounter of 04/11/13  URINALYSIS, ROUTINE W REFLEX MICROSCOPIC      Result Value Range   Color, Urine YELLOW  YELLOW   APPearance CLEAR  CLEAR   Specific Gravity, Urine 1.020  1.005 - 1.030   pH 6.0  5.0 - 8.0   Glucose, UA NEGATIVE  NEGATIVE mg/dL   Hgb urine dipstick NEGATIVE  NEGATIVE   Bilirubin Urine NEGATIVE  NEGATIVE   Ketones, ur NEGATIVE  NEGATIVE mg/dL   Protein, ur NEGATIVE  NEGATIVE mg/dL   Urobilinogen, UA 0.2  0.0 - 1.0 mg/dL   Nitrite NEGATIVE  NEGATIVE   Leukocytes, UA NEGATIVE  NEGATIVE  POCT PREGNANCY, URINE      Result Value Range   Preg Test, Ur NEGATIVE  NEGATIVE   No results found.  Patient complaining of lower abdominal pain with copious non-bloody diarrhea since today along with sore throat. Patient denies any vomiting, but endorse nausea. Denies any fevers. Abdomen is soft, but tender in RLQ, Suprapubic, and LLQ area. Rapid strep has been collected and is running for initial complaint of sore throat. Patient will require further evaluation outside of fast track.   The patient appears stable so that the remainder of the MSE may be completed by another provider.  Lise AuerJennifer L Naiya Corral, PA-C 05/24/13 1440

## 2013-05-24 NOTE — Discharge Instructions (Signed)
Contact Dermatitis °Contact dermatitis is a reaction to certain substances that touch the skin. Contact dermatitis can be either irritant contact dermatitis or allergic contact dermatitis. Irritant contact dermatitis does not require previous exposure to the substance for a reaction to occur. Allergic contact dermatitis only occurs if you have been exposed to the substance before. Upon a repeat exposure, your body reacts to the substance.  °CAUSES  °Many substances can cause contact dermatitis. Irritant dermatitis is most commonly caused by repeated exposure to mildly irritating substances, such as: °· Makeup. °· Soaps. °· Detergents. °· Bleaches. °· Acids. °· Metal salts, such as nickel. °Allergic contact dermatitis is most commonly caused by exposure to: °· Poisonous plants. °· Chemicals (deodorants, shampoos). °· Jewelry. °· Latex. °· Neomycin in triple antibiotic cream. °· Preservatives in products, including clothing. °SYMPTOMS  °The area of skin that is exposed may develop: °· Dryness or flaking. °· Redness. °· Cracks. °· Itching. °· Pain or a burning sensation. °· Blisters. °With allergic contact dermatitis, there may also be swelling in areas such as the eyelids, mouth, or genitals.  °DIAGNOSIS  °Your caregiver can usually tell what the problem is by doing a physical exam. In cases where the cause is uncertain and an allergic contact dermatitis is suspected, a patch skin test may be performed to help determine the cause of your dermatitis. °TREATMENT °Treatment includes protecting the skin from further contact with the irritating substance by avoiding that substance if possible. Barrier creams, powders, and gloves may be helpful. Your caregiver may also recommend: °· Steroid creams or ointments applied 2 times daily. For best results, soak the rash area in cool water for 20 minutes. Then apply the medicine. Cover the area with a plastic wrap. You can store the steroid cream in the refrigerator for a "chilly"  effect on your rash. That may decrease itching. Oral steroid medicines may be needed in more severe cases. °· Antibiotics or antibacterial ointments if a skin infection is present. °· Antihistamine lotion or an antihistamine taken by mouth to ease itching. °· Lubricants to keep moisture in your skin. °· Burow's solution to reduce redness and soreness or to dry a weeping rash. Mix one packet or tablet of solution in 2 cups cool water. Dip a clean washcloth in the mixture, wring it out a bit, and put it on the affected area. Leave the cloth in place for 30 minutes. Do this as often as possible throughout the day. °· Taking several cornstarch or baking soda baths daily if the area is too large to cover with a washcloth. °Harsh chemicals, such as alkalis or acids, can cause skin damage that is like a burn. You should flush your skin for 15 to 20 minutes with cold water after such an exposure. You should also seek immediate medical care after exposure. Bandages (dressings), antibiotics, and pain medicine may be needed for severely irritated skin.  °HOME CARE INSTRUCTIONS °· Avoid the substance that caused your reaction. °· Keep the area of skin that is affected away from hot water, soap, sunlight, chemicals, acidic substances, or anything else that would irritate your skin. °· Do not scratch the rash. Scratching may cause the rash to become infected. °· You may take cool baths to help stop the itching. °· Only take over-the-counter or prescription medicines as directed by your caregiver. °· See your caregiver for follow-up care as directed to make sure your skin is healing properly. °SEEK MEDICAL CARE IF:  °· Your condition is not better after 3   days of treatment.  You seem to be getting worse.  You see signs of infection such as swelling, tenderness, redness, soreness, or warmth in the affected area.  You have any problems related to your medicines. Document Released: 04/09/2000 Document Revised: 07/05/2011  Document Reviewed: 09/15/2010 [facility] Patient Information 2014 [place], [place].  Pharyngitis Pharyngitis is redness, pain, and swelling (inflammation) of your pharynx.  CAUSES  Pharyngitis is usually caused by infection. Most of the time, these infections are from viruses (viral) and are part of a cold. However, sometimes pharyngitis is caused by bacteria (bacterial). Pharyngitis can also be caused by allergies. Viral pharyngitis may be spread from person to person by coughing, sneezing, and personal items or utensils (cups, forks, spoons, toothbrushes). Bacterial pharyngitis may be spread from person to person by more intimate contact, such as kissing.  SIGNS AND SYMPTOMS  Symptoms of pharyngitis include:   Sore throat.   Tiredness (fatigue).   Low-grade fever.   Headache.  Joint pain and muscle aches.  Skin rashes.  Swollen lymph nodes.  Plaque-like film on throat or tonsils (often seen with bacterial pharyngitis). DIAGNOSIS  Your health care provider will ask you questions about your illness and your symptoms. Your medical history, along with a physical exam, is often all that is needed to diagnose pharyngitis. Sometimes, a rapid strep test is done. Other lab tests may also be done, depending on the suspected cause.  TREATMENT  Viral pharyngitis will usually get better in 3 4 days without the use of medicine. Bacterial pharyngitis is treated with medicines that kill germs (antibiotics).  HOME CARE INSTRUCTIONS   Drink enough water and fluids to keep your urine clear or pale yellow.   Only take over-the-counter or prescription medicines as directed by your health care provider:   If you are prescribed antibiotics, make sure you finish them even if you start to feel better.   Do not take aspirin.   Get lots of rest.   Gargle with 8 oz of salt water ( tsp of salt per 1 qt of water) as often as every 1 2 hours to soothe your throat.   Throat lozenges (if you are  not at risk for choking) or sprays may be used to soothe your throat. SEEK MEDICAL CARE IF:   You have large, tender lumps in your neck.  You have a rash.  You cough up green, yellow-brown, or bloody spit. SEEK IMMEDIATE MEDICAL CARE IF:   Your neck becomes stiff.  You drool or are unable to swallow liquids.  You vomit or are unable to keep medicines or liquids down.  You have severe pain that does not go away with the use of recommended medicines.  You have trouble breathing (not caused by a stuffy nose). MAKE SURE YOU:   Understand these instructions.  Will watch your condition.  Will get help right away if you are not doing well or get worse. Document Released: 04/12/2005 Document Revised: 01/31/2013 Document Reviewed: 12/18/2012 [facility] Patient Information 2014 [place], [place].

## 2013-05-24 NOTE — ED Notes (Signed)
Patient refused to put on a gown, she stated she just here for strep throat, Nurse Irving Burtonmily was informed.

## 2013-05-26 LAB — CULTURE, GROUP A STREP

## 2013-08-01 IMAGING — US US TRANSVAGINAL NON-OB
1 series · 14 of 25 positions shown · non-contrast
Comparison: Ultrasound 08/31/2011

CLINICAL DATA: Acute right pelvic pain.  Evaluate for possible
torsion.  LMP 08/31/2011.  Urine pregnancy test today is negative.

TRANSVAGINAL ULTRASOUND OF PELVIS
DOPPLER ULTRASOUND OF OVARIES
TECHNIQUE: Transvaginal ultrasound examination of the pelvis was
performed including evaluation of the uterus, ovaries, adnexal
regions, and pelvic cul-de-sac. Color and duplex Doppler ultrasound
was utilized to evaluate blood flow to the ovaries.

[Series 1: us transvaginal non-ob · 14 of 51 slices shown]
[im 1/51]
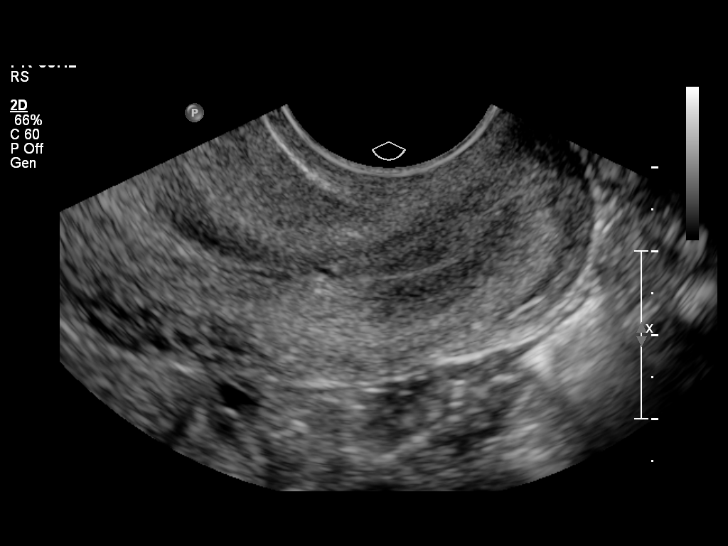
[im 5/51]
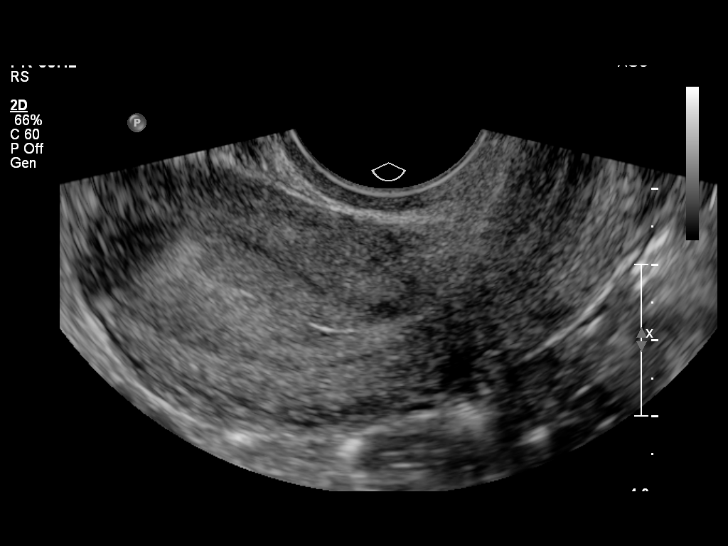
[im 9/51]
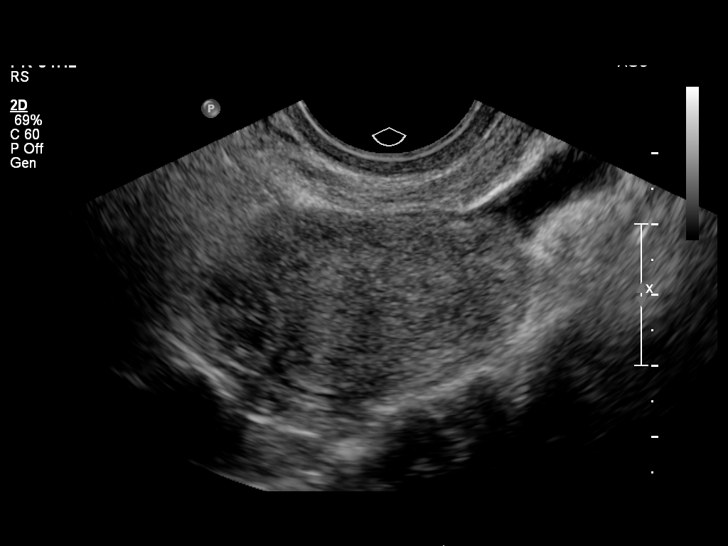
[im 13/51]
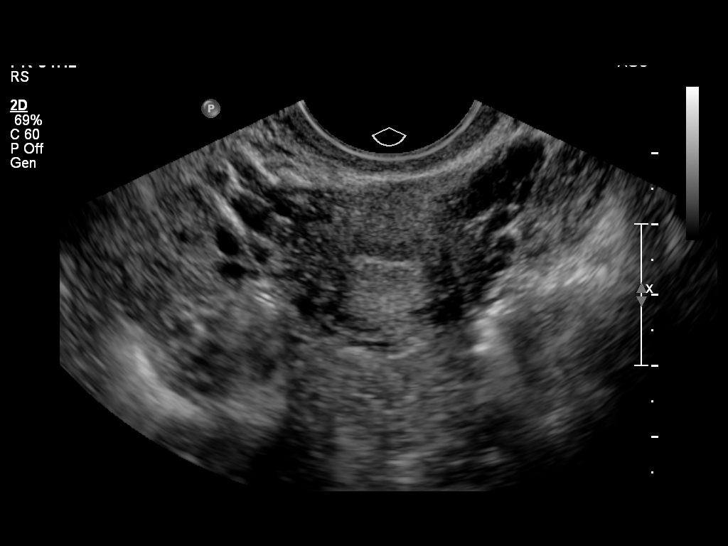
[im 17/51]
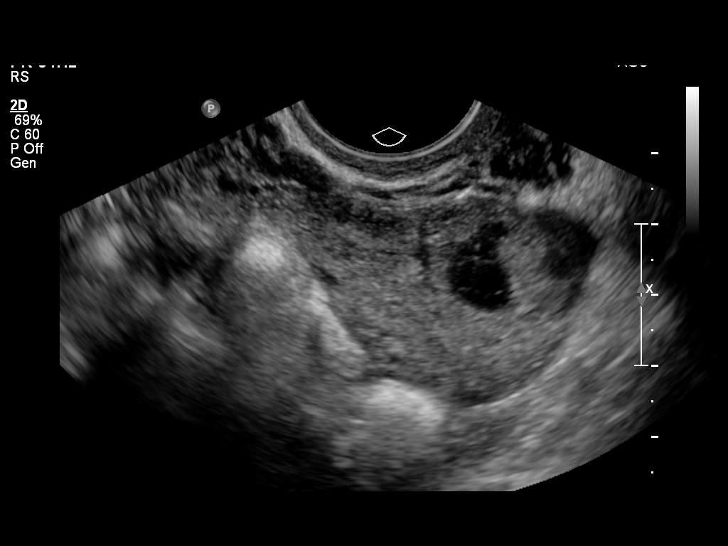
[im 19/51]
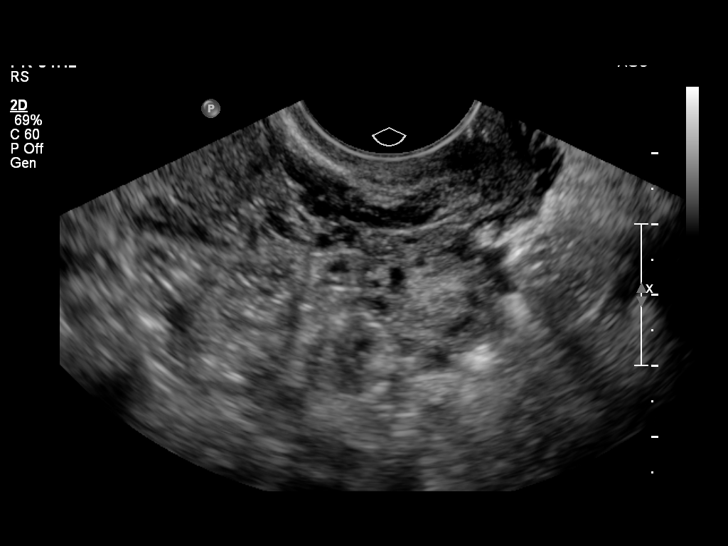
[im 23/51]
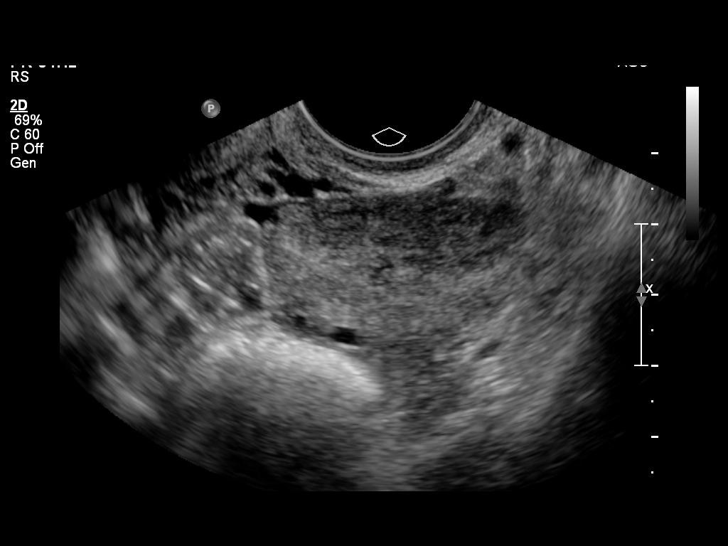
[im 28/51]
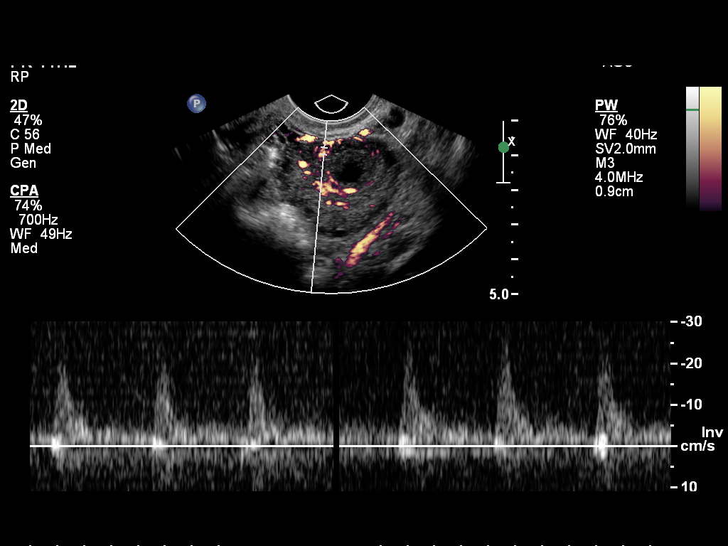
[im 32/51]
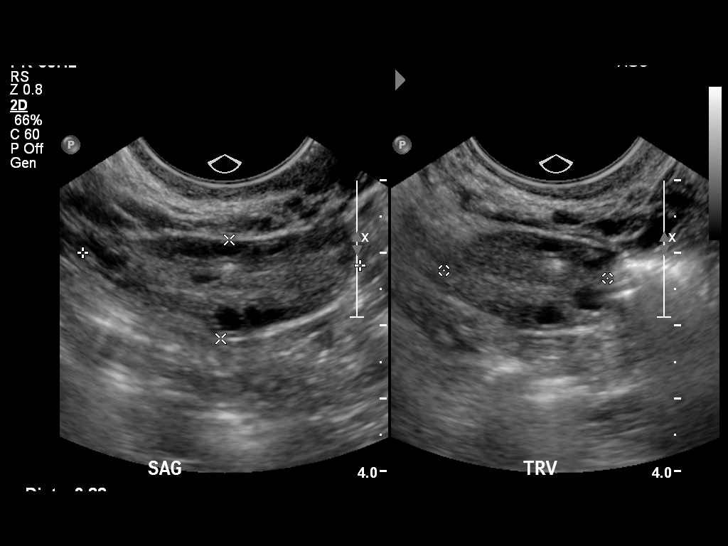
[im 34/51]
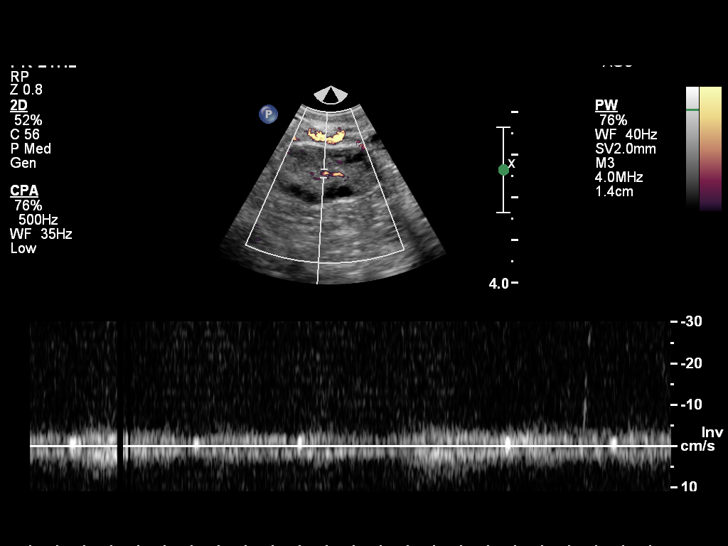
[im 38/51]
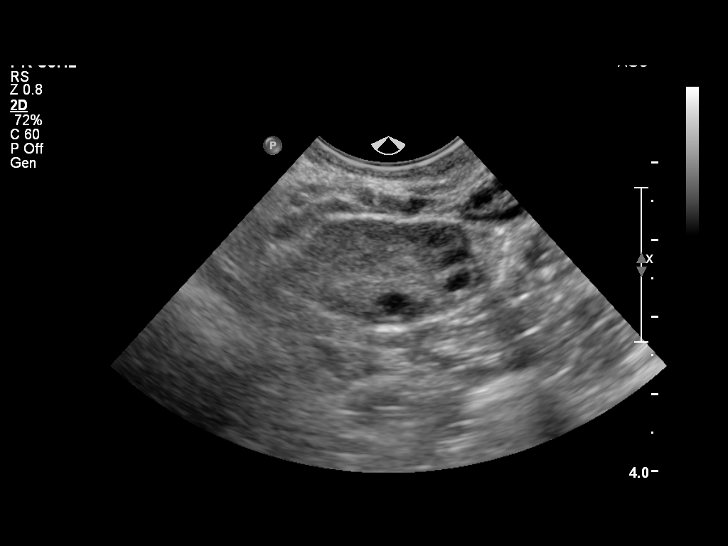
[im 42/51]
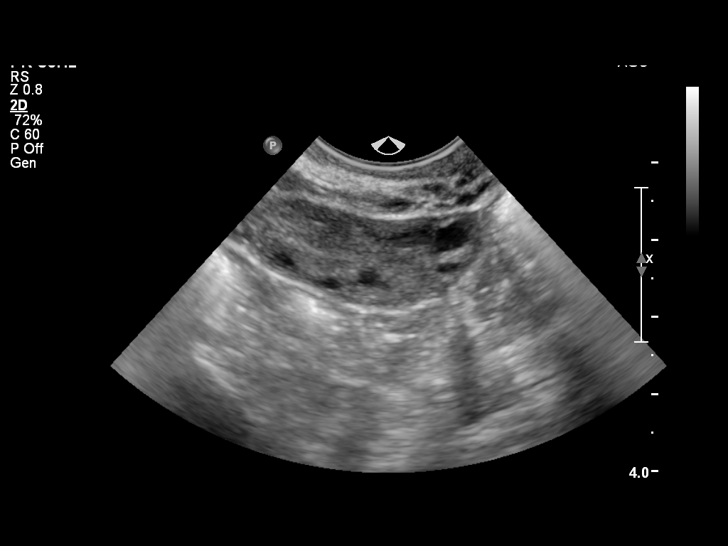
[im 46/51]
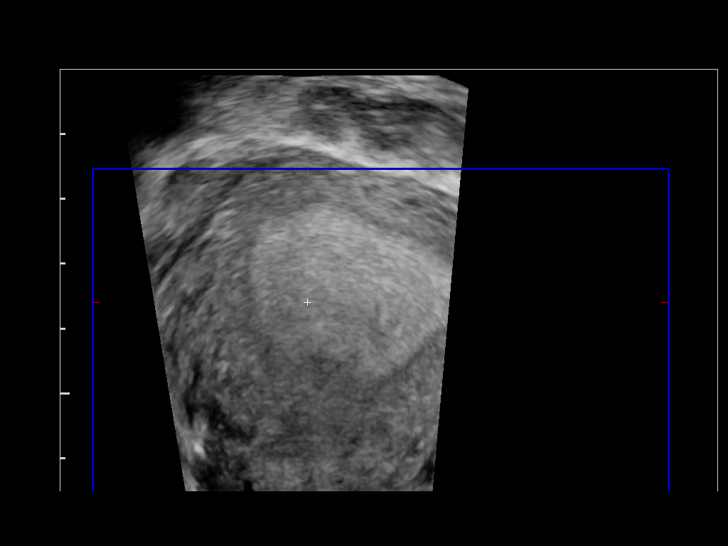
[im 51/51]
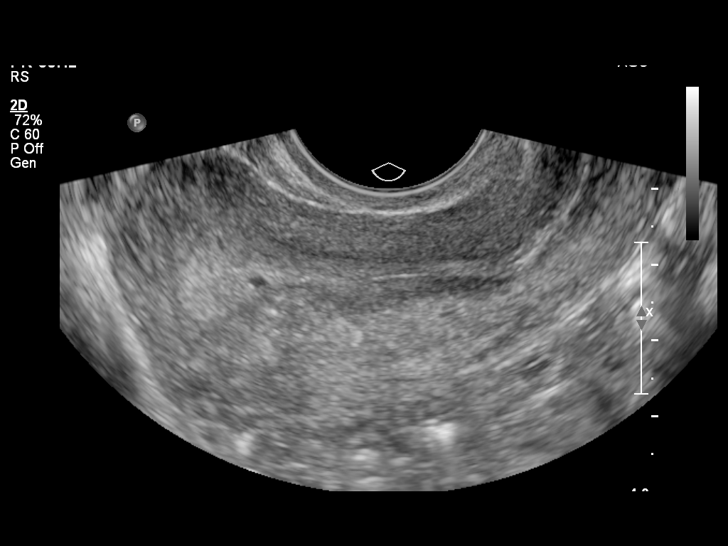

[14 of 25 positions shown; findings below may reference images not displayed]

FINDINGS: Uterus:  The uterus is 7.4 x 3.4 x 5.4 cm.  No uterine mass
identified.

Endometrium: The endometrium is mildly heterogeneous, 9.5 mm in
diameter. Small fluid collection within the endometrium is noted.
Urine pregnancy test is negative.

Right ovary: Right ovary is 3.8 x 1.4 x 2.3 cm.

Left ovary: Left ovary is 4.5 x 2.9 x 3.4 cm.

Other Findings:  There is a trace of free pelvic fluid.

Pulsed Doppler evaluation demonstrates Normal study. No evidence of
pelvic mass or other significant abnormality.
IMPRESSION: Normal study. No evidence of pelvic mass or other significant
abnormality.

No sonographic evidence for ovarian torsion.

## 2013-08-11 IMAGING — US US OB TRANSVAGINAL
1 series · 13 of 28 positions shown · non-contrast
Comparison: none

[Series 1: us ob transvaginal · 13 of 30 slices shown]
[im 2/30]
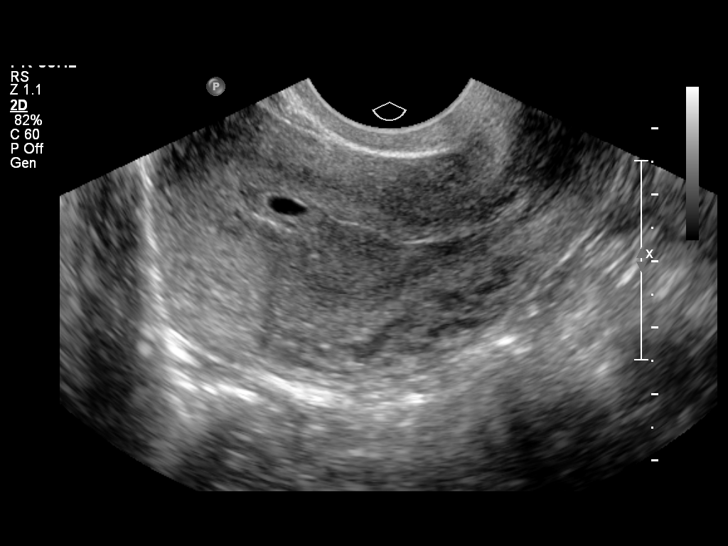
[im 4/30]
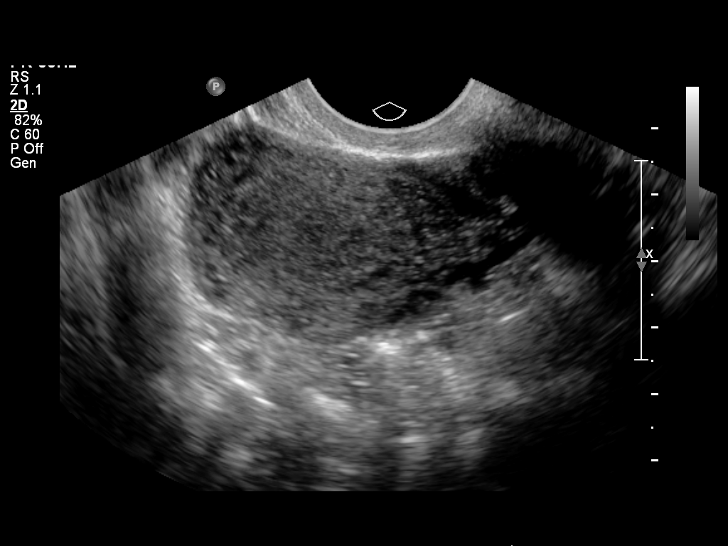
[im 6/30]
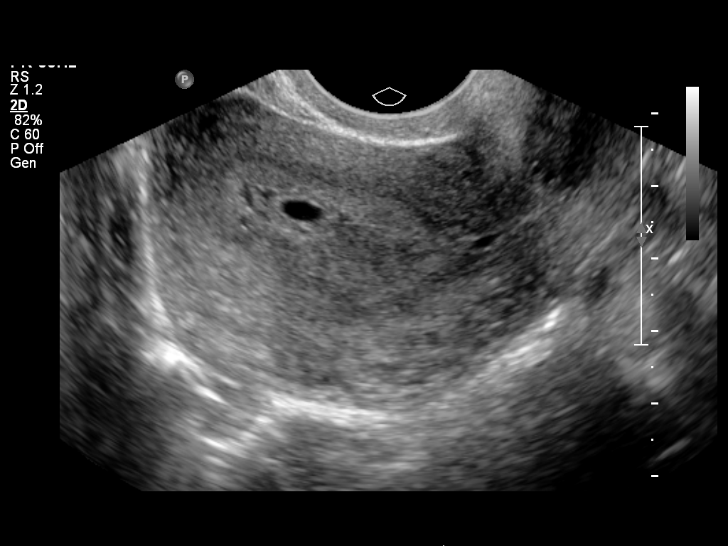
[im 8/30]
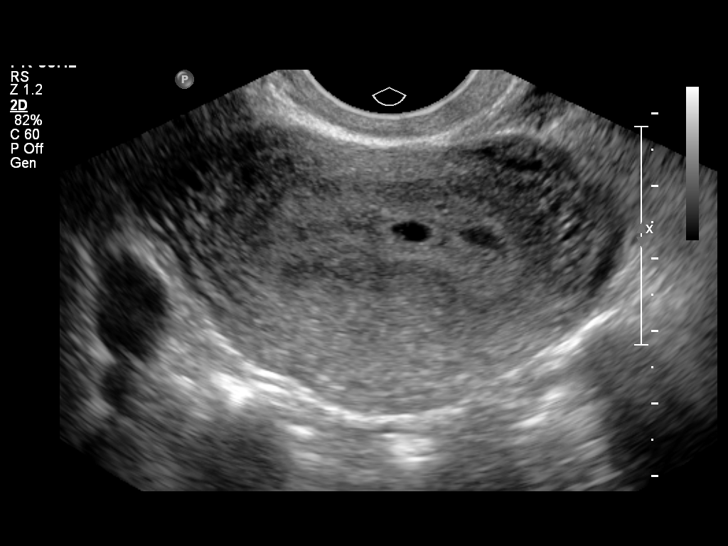
[im 10/30]
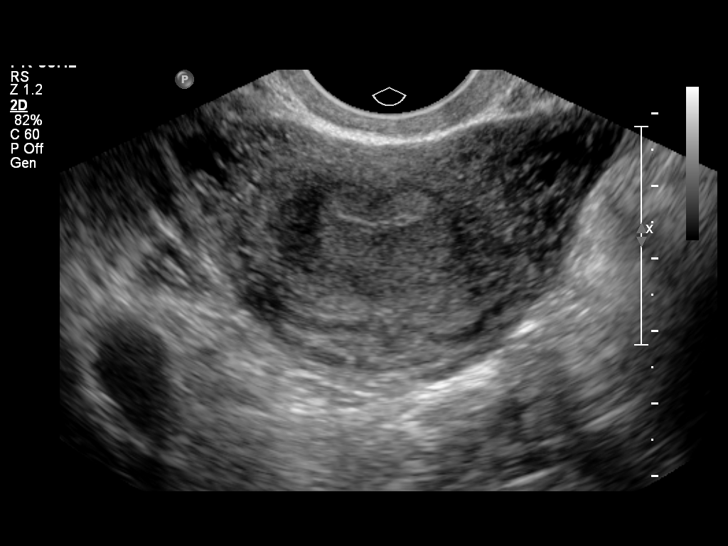
[im 12/30]
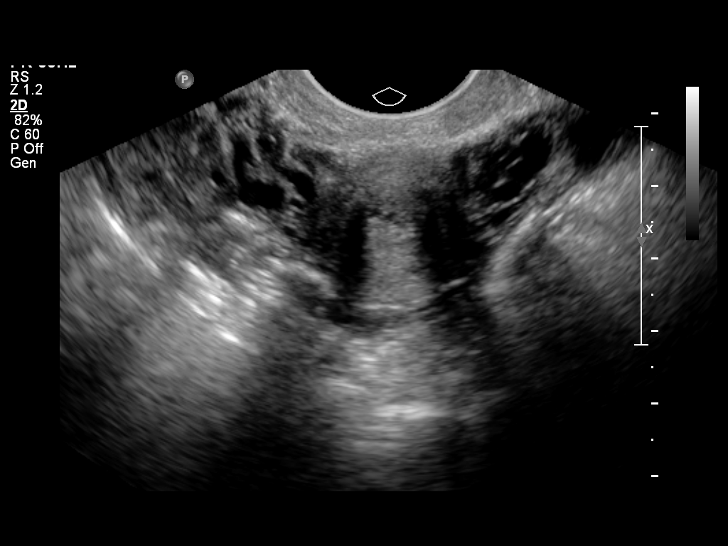
[im 16/30]
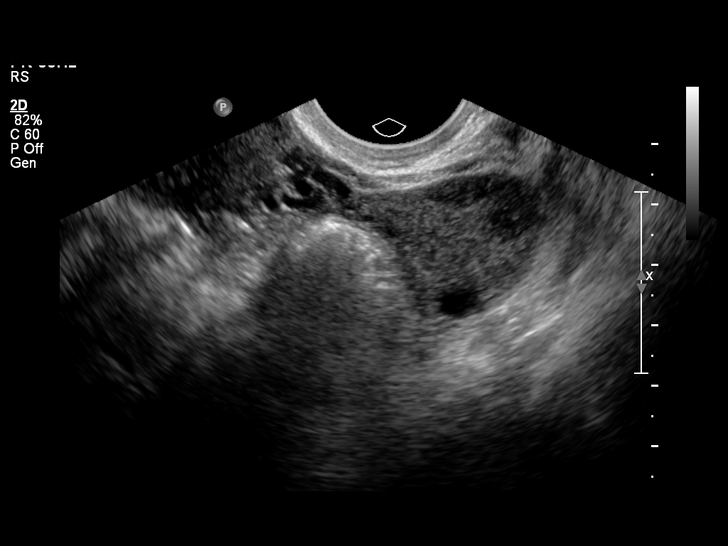
[im 18/30]
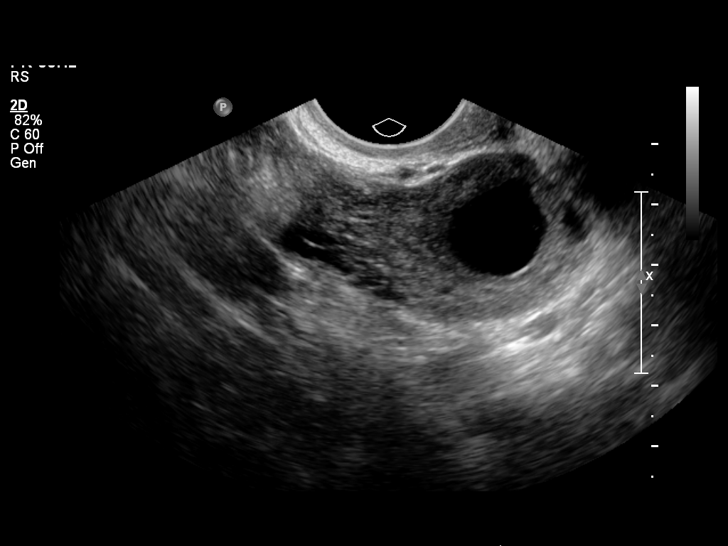
[im 20/30]
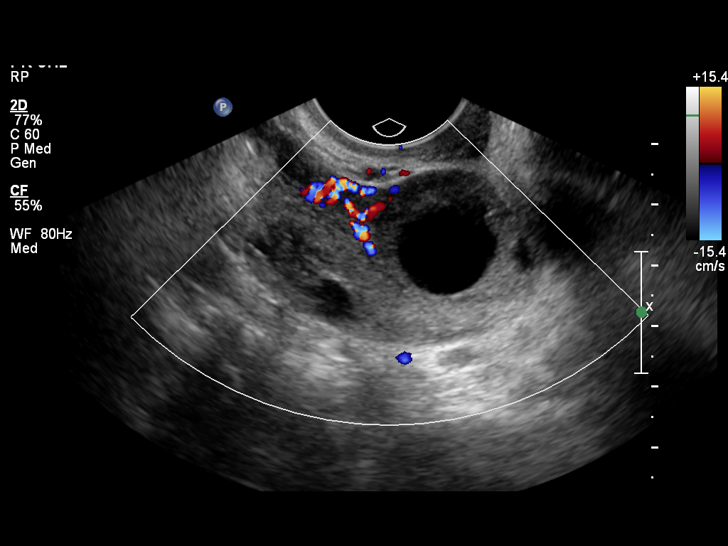
[im 22/30]
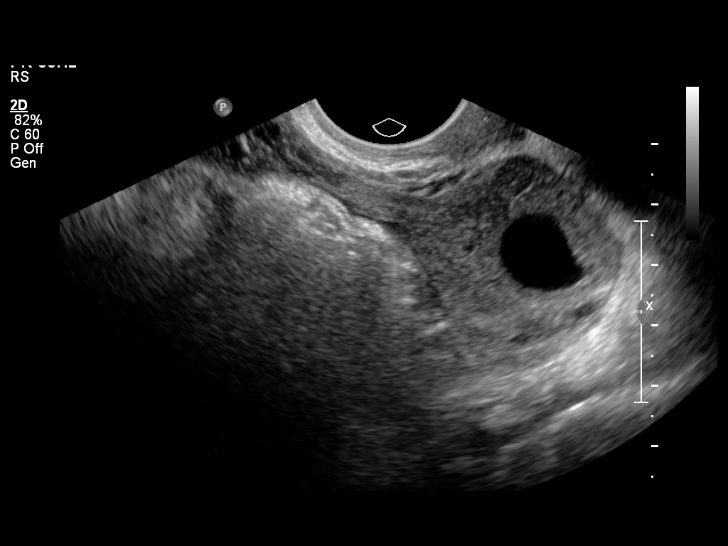
[im 24/30]
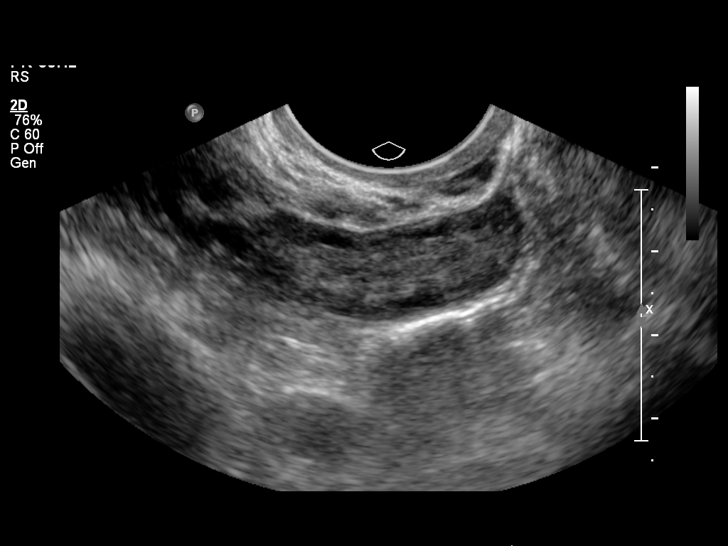
[im 26/30]
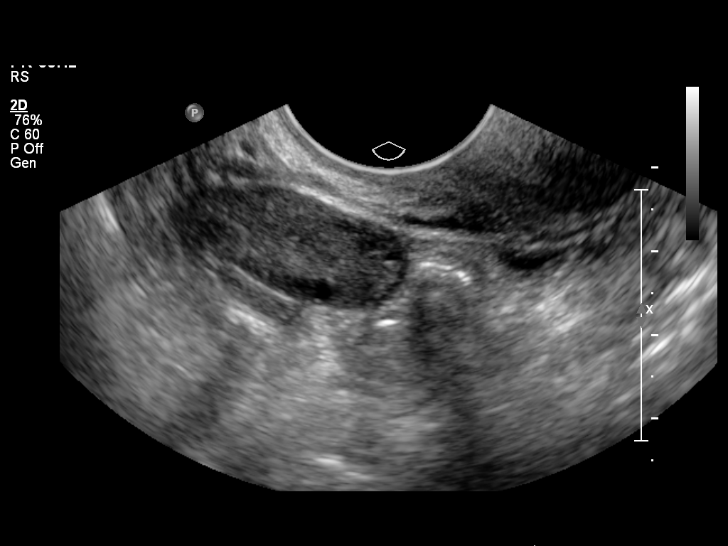
[im 28/30]
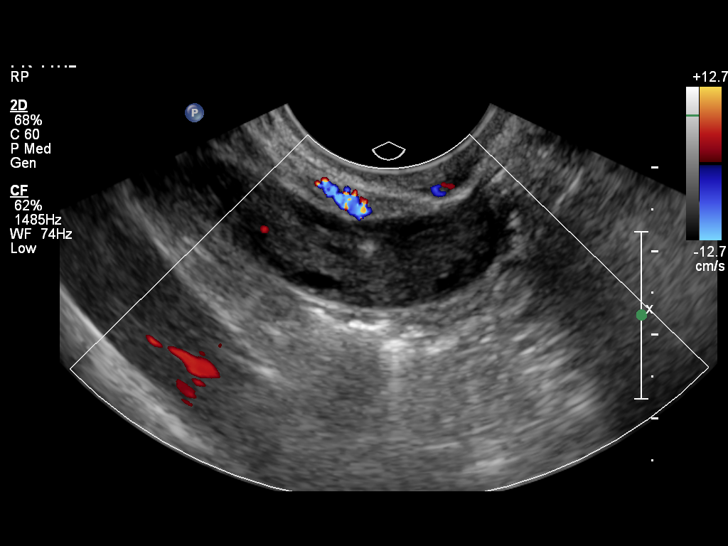

[13 of 28 positions shown; findings below may reference images not displayed]

OBSTETRICS REPORT
                      (Signed Final 09/23/2011 [DATE])

Procedures

 US OB TRANSVAGINAL                                    76817.0
Indications

 Pain - Abdominal/Pelvic
 Pregnancy with inconclusive fetal viability
Fetal Evaluation

 Preg. Location:    Intrauterine
 Gest. Sac:         Intrauterine, small
                    subchorionic bleed
 Yolk Sac:          Not visualized
 Fetal Pole:        Not visualized
 Cardiac Activity:  No embryo visualized
Biometry

 GS:       4.6  mm    G. Age:   4w 6d                  EDD:   05/26/12
Cervix Uterus Adnexa

 Cervix:       Closed.
 Uterus:       Subchorionic collection, see comments.
 Cul De Sac:   No free fluid seen.

 Left Ovary:   Within normal limits measuring 2.5 x 4.7 x 3.0 cm.
               Small corpus luteum noted.
 Right Ovary:  Within normal limits measuring 2.4 x 3.3 x 1.4 cm.
 Adnexa:     No abnormality visualized.
Impression

 SIngle intrauterine gestational sac with double decidual sign
 present. No fetal pole or yolk sac is seen, but not expected at
 today's MSD of 4.6 mm. With normal growth rates we would
 typically expect to see developement of a yolk sac in 1 week
 and fetal pole in 2 weeks. Correlation with serial bHCG and
 follow up ultrasound as indicated can be performed to assess
 for appropriate progression of gestation.

 Normal ovaries.

## 2013-09-04 ENCOUNTER — Encounter (HOSPITAL_COMMUNITY): Payer: Self-pay | Admitting: *Deleted

## 2013-09-04 ENCOUNTER — Inpatient Hospital Stay (HOSPITAL_COMMUNITY)
Admission: AD | Admit: 2013-09-04 | Discharge: 2013-09-04 | Disposition: A | Discharge status: Home / Self Care | Payer: Medicaid Other | Source: Ambulatory Visit | Attending: Obstetrics & Gynecology | Admitting: Obstetrics & Gynecology

## 2013-09-04 DIAGNOSIS — R109 Unspecified abdominal pain: Secondary | ICD-10-CM | POA: Insufficient documentation

## 2013-09-04 LAB — CBC
HEMATOCRIT: 40.2 % (ref 36.0–46.0)
Hemoglobin: 14.1 g/dL (ref 12.0–15.0)
MCH: 27.7 pg (ref 26.0–34.0)
MCHC: 35.1 g/dL (ref 30.0–36.0)
MCV: 79 fL (ref 78.0–100.0)
PLATELETS: 266 10*3/uL (ref 150–400)
RBC: 5.09 MIL/uL (ref 3.87–5.11)
RDW: 13.5 % (ref 11.5–15.5)
WBC: 9.4 10*3/uL (ref 4.0–10.5)

## 2013-09-04 LAB — URINALYSIS, ROUTINE W REFLEX MICROSCOPIC
Bilirubin Urine: NEGATIVE
Glucose, UA: NEGATIVE mg/dL
Hgb urine dipstick: NEGATIVE
Ketones, ur: NEGATIVE mg/dL
Nitrite: NEGATIVE
Protein, ur: NEGATIVE mg/dL
SPECIFIC GRAVITY, URINE: 1.02 (ref 1.005–1.030)
UROBILINOGEN UA: 0.2 mg/dL (ref 0.0–1.0)
pH: 6 (ref 5.0–8.0)

## 2013-09-04 LAB — URINE MICROSCOPIC-ADD ON

## 2013-09-04 LAB — POCT PREGNANCY, URINE: PREG TEST UR: NEGATIVE

## 2013-09-04 MED ORDER — ESOMEPRAZOLE MAGNESIUM 40 MG PO CPDR: 40.0000 mg | DELAYED_RELEASE_CAPSULE | Freq: Every day | ORAL | Status: DC

## 2013-09-04 MED ORDER — OXYCODONE-ACETAMINOPHEN 5-325 MG PO TABS
2.0000 | ORAL_TABLET | Freq: Once | ORAL | Status: AC
  Administered 2013-09-04: 2 via ORAL
  Filled 2013-09-04: qty 2

## 2013-09-04 MED ORDER — GI COCKTAIL ~~LOC~~
30.0000 mL | Freq: Once | ORAL | Status: AC
  Administered 2013-09-04: 30 mL via ORAL
  Filled 2013-09-04: qty 30

## 2013-09-04 NOTE — MAU Provider Note (Signed)
History     CSN: [id number]  Arrival date and time: 09/04/13 1816   First Provider Initiated Contact with Patient 09/04/13 2021      Chief Complaint  Patient presents with  . Abdominal Pain  . Nausea   HPIPt is not pregnant and present with abd pain.Pt took Ibuprofen 2 pills for headache on thurs 5/7.  After she took the She has been having abd pain coming and going since Thurs night like someone pinching in stomach. Pt has hx of perforated ulcer in Feb 2014 but this does not feel like that.  Pt denies dark or blood stools. Pt denies any GYN problems- Pt is [facility]     RN note: Patient states she had abdominal pain since 5-7. States she is having a little spotting, nausea no vomiting. Had a perforated ulcer in 2-14 and states the pain is similar.     Past Medical History  Diagnosis Date  . Hypoglycemia   . Ovarian cyst 2011  . Constipation   . Sickle cell trait   . History of dysmenorrhea   . Gastric ulcer     Past Surgical History  Procedure Laterality Date  . Laparoscopic ovarian cystectomy  2011  . Eye surgery      2004  . Laparotomy N/A 06/16/2012    Procedure: EXPLORATORY LAPAROTOMY;  Surgeon: Emelia LoronMatthew Wakefield, MD;  [facility];  Service: General;  Laterality: N/A;  Repair of duodenal ulcer  . Repair of perforated ulcer N/A 06/16/2012    Procedure: REPAIR OF PERFORATED ULCER;  Surgeon: Emelia LoronMatthew Wakefield, MD;  [facility];  Service: General;  Laterality: N/A;  duodenal    Family History  Problem Relation Age of Onset  . Anesthesia problems Neg Hx   . Hearing loss Neg Hx   . Breast cancer Paternal Grandmother   . Brain cancer      History  Substance Use Topics  . Smoking status: Never Smoker   . Smokeless tobacco: Never Used  . Alcohol Use: No    Allergies:  Allergies  Allergen Reactions  . Nsaids     Diagnosed with a perforated duodenal ulcer due to NSAIDS    Prescriptions prior to admission  Medication Sig  Dispense Refill  . acetaminophen (TYLENOL) 325 MG tablet Take 650 mg by mouth every 6 (six) hours as needed for moderate pain.        Review of Systems  Constitutional: Negative for fever and chills.  Gastrointestinal: Positive for abdominal pain. Negative for nausea, vomiting, diarrhea, constipation and blood in stool.  Genitourinary: Negative for dysuria and urgency.  Neurological: Negative for dizziness.   Physical Exam   Blood pressure 118/83, pulse 91, temperature 98.8 F (37.1 C), resp. rate 16, height 5\' 4"  (1.626 m), weight 67.223 kg (148 lb 3.2 oz), SpO2 100.00%, not currently breastfeeding.  Physical Exam  Vitals reviewed. Constitutional: She appears well-developed and well-nourished. No distress.  HENT:  Head: Normocephalic.  Eyes: Pupils are equal, round, and reactive to light.  Neck: Normal range of motion.  Cardiovascular: Normal rate.   Respiratory: Effort normal.  GI: Soft. Bowel sounds are normal. She exhibits no distension. There is tenderness.  Superficial tenderness left of umbilicus- no rebound  Musculoskeletal: Normal range of motion.  Neurological: She is alert.  Skin: Skin is warm and dry.  Psychiatric: She has a normal mood and affect.    MAU Course  Procedures  CBC Results for orders placed during the hospital  encounter of 09/04/13 (from the past 24 hour(s))  URINALYSIS, ROUTINE W REFLEX MICROSCOPIC     Status: Abnormal   Collection Time    09/04/13  6:50 PM      Result Value Ref Range   Color, Urine YELLOW  YELLOW   APPearance CLEAR  CLEAR   Specific Gravity, Urine 1.020  1.005 - 1.030   pH 6.0  5.0 - 8.0   Glucose, UA NEGATIVE  NEGATIVE mg/dL   Hgb urine dipstick NEGATIVE  NEGATIVE   Bilirubin Urine NEGATIVE  NEGATIVE   Ketones, ur NEGATIVE  NEGATIVE mg/dL   Protein, ur NEGATIVE  NEGATIVE mg/dL   Urobilinogen, UA 0.2  0.0 - 1.0 mg/dL   Nitrite NEGATIVE  NEGATIVE   Leukocytes, UA SMALL (*) NEGATIVE  URINE MICROSCOPIC-ADD ON     Status:  None   Collection Time    09/04/13  6:50 PM      Result Value Ref Range   Squamous Epithelial / LPF RARE  RARE   WBC, UA 0-2  <3 WBC/hpf   RBC / HPF 0-2  <3 RBC/hpf   Bacteria, UA RARE  RARE  POCT PREGNANCY, URINE     Status: None   Collection Time    09/04/13  7:44 PM      Result Value Ref Range   Preg Test, Ur NEGATIVE  NEGATIVE  CBC     Status: None   Collection Time    09/04/13  8:45 PM      Result Value Ref Range   WBC 9.4  4.0 - 10.5 K/uL   RBC 5.09  3.87 - 5.11 MIL/uL   Hemoglobin 14.1  12.0 - 15.0 g/dL   HCT 21.340.2  08.636.0 - 57.846.0 %   MCV 79.0  78.0 - 100.0 fL   MCH 27.7  26.0 - 34.0 pg   MCHC 35.1  30.0 - 36.0 g/dL   RDW 46.913.5  62.911.5 - 52.815.5 %   Platelets 266  150 - 400 K/uL   GI cocktail D[facility] with pt [facility] or f/u with surgeon Discussed at length about need for further evaluation- [facility] if increase pain or fever F/u with surgeon or PCP 2 Percocet given  Assessment and Plan  Abdominal [facility], PCP orsurgeon if sx worsen  Jean RosenthalSusan P Jeffrey Voth 09/04/2013, 8:21 PM

## 2013-09-04 NOTE — MAU Note (Signed)
Patient states she had abdominal pain since 5-7. States she is having a little spotting, nausea no vomiting. Had a perforated ulcer in 2-14 and states the pain is similar.

## 2013-09-04 NOTE — MAU Note (Addendum)
Pt reports pain in the left side of abdomen since Thursday. Pt took some ibuprofen on Thursday and the pain started afterwards.

## 2013-09-06 NOTE — MAU Provider Note (Signed)

## 2013-10-26 ENCOUNTER — Emergency Department (HOSPITAL_COMMUNITY)
Admission: EM | Admit: 2013-10-26 | Discharge: 2013-10-26 | Disposition: A | Discharge status: Home / Self Care | Payer: Medicaid Other | Attending: Emergency Medicine | Admitting: Emergency Medicine

## 2013-10-26 ENCOUNTER — Emergency Department (HOSPITAL_COMMUNITY): Payer: Medicaid Other

## 2013-10-26 ENCOUNTER — Encounter (HOSPITAL_COMMUNITY): Payer: Self-pay | Admitting: Emergency Medicine

## 2013-10-26 DIAGNOSIS — Z79899 Other long term (current) drug therapy: Secondary | ICD-10-CM | POA: Insufficient documentation

## 2013-10-26 DIAGNOSIS — Z3202 Encounter for pregnancy test, result negative: Secondary | ICD-10-CM | POA: Insufficient documentation

## 2013-10-26 DIAGNOSIS — Z862 Personal history of diseases of the blood and blood-forming organs and certain disorders involving the immune mechanism: Secondary | ICD-10-CM | POA: Insufficient documentation

## 2013-10-26 DIAGNOSIS — R1013 Epigastric pain: Secondary | ICD-10-CM | POA: Insufficient documentation

## 2013-10-26 DIAGNOSIS — Z8719 Personal history of other diseases of the digestive system: Secondary | ICD-10-CM | POA: Insufficient documentation

## 2013-10-26 DIAGNOSIS — Z8639 Personal history of other endocrine, nutritional and metabolic disease: Secondary | ICD-10-CM | POA: Insufficient documentation

## 2013-10-26 DIAGNOSIS — Z8742 Personal history of other diseases of the female genital tract: Secondary | ICD-10-CM | POA: Insufficient documentation

## 2013-10-26 LAB — CBC WITH DIFFERENTIAL/PLATELET
Basophils Absolute: 0 10*3/uL (ref 0.0–0.1)
Basophils Relative: 0 % (ref 0–1)
EOS ABS: 0 10*3/uL (ref 0.0–0.7)
Eosinophils Relative: 0 % (ref 0–5)
HCT: 39.8 % (ref 36.0–46.0)
Hemoglobin: 14.3 g/dL (ref 12.0–15.0)
LYMPHS ABS: 0.5 10*3/uL — AB (ref 0.7–4.0)
Lymphocytes Relative: 5 % — ABNORMAL LOW (ref 12–46)
MCH: 28.4 pg (ref 26.0–34.0)
MCHC: 35.9 g/dL (ref 30.0–36.0)
MCV: 79 fL (ref 78.0–100.0)
Monocytes Absolute: 0.5 10*3/uL (ref 0.1–1.0)
Monocytes Relative: 6 % (ref 3–12)
Neutro Abs: 8.3 10*3/uL — ABNORMAL HIGH (ref 1.7–7.7)
Neutrophils Relative %: 89 % — ABNORMAL HIGH (ref 43–77)
PLATELETS: 272 10*3/uL (ref 150–400)
RBC: 5.04 MIL/uL (ref 3.87–5.11)
RDW: 12.8 % (ref 11.5–15.5)
WBC: 9.4 10*3/uL (ref 4.0–10.5)

## 2013-10-26 LAB — COMPREHENSIVE METABOLIC PANEL
ALBUMIN: 4.2 g/dL (ref 3.5–5.2)
ALT: 14 U/L (ref 0–35)
ANION GAP: 15 (ref 5–15)
AST: 21 U/L (ref 0–37)
Alkaline Phosphatase: 66 U/L (ref 39–117)
BUN: 10 mg/dL (ref 6–23)
CALCIUM: 9.3 mg/dL (ref 8.4–10.5)
CO2: 19 mEq/L (ref 19–32)
CREATININE: 0.87 mg/dL (ref 0.50–1.10)
Chloride: 106 mEq/L (ref 96–112)
GFR calc Af Amer: >90 mL/min (ref >90)
GFR calc non Af Amer: >90 mL/min (ref >90)
Glucose, Bld: 86 mg/dL (ref 70–99)
Potassium: 4.2 mEq/L (ref 3.7–5.3)
Sodium: 140 mEq/L (ref 137–147)
Total Bilirubin: 0.7 mg/dL (ref 0.3–1.2)
Total Protein: 7.6 g/dL (ref 6.0–8.3)

## 2013-10-26 LAB — I-STAT TROPONIN, ED: TROPONIN I, POC: 0 ng/mL (ref 0.00–0.08)

## 2013-10-26 LAB — URINE MICROSCOPIC-ADD ON

## 2013-10-26 LAB — POC URINE PREG, ED: PREG TEST UR: NEGATIVE

## 2013-10-26 LAB — URINALYSIS, ROUTINE W REFLEX MICROSCOPIC
Bilirubin Urine: NEGATIVE
Glucose, UA: NEGATIVE mg/dL
HGB URINE DIPSTICK: NEGATIVE
Ketones, ur: NEGATIVE mg/dL
NITRITE: NEGATIVE
PROTEIN: NEGATIVE mg/dL
SPECIFIC GRAVITY, URINE: 1.017 (ref 1.005–1.030)
Urobilinogen, UA: 0.2 mg/dL (ref 0.0–1.0)
pH: 5 (ref 5.0–8.0)

## 2013-10-26 LAB — LIPASE, BLOOD: LIPASE: 17 U/L (ref 11–59)

## 2013-10-26 MED ORDER — IOHEXOL 300 MG/ML  SOLN
25.0000 mL | Freq: Once | INTRAMUSCULAR | Status: AC | PRN
  Administered 2013-10-26: 25 mL via ORAL

## 2013-10-26 MED ORDER — MORPHINE SULFATE 4 MG/ML IJ SOLN
8.0000 mg | Freq: Once | INTRAMUSCULAR | Status: AC
  Administered 2013-10-26: 8 mg via INTRAVENOUS
  Filled 2013-10-26: qty 2

## 2013-10-26 MED ORDER — OMEPRAZOLE 20 MG PO CPDR: 20.0000 mg | DELAYED_RELEASE_CAPSULE | Freq: Every day | ORAL | Status: DC

## 2013-10-26 MED ORDER — SODIUM CHLORIDE 0.9 % IV BOLUS (SEPSIS)
1000.0000 mL | Freq: Once | INTRAVENOUS | Status: AC
  Administered 2013-10-26: 1000 mL via INTRAVENOUS

## 2013-10-26 MED ORDER — ONDANSETRON HCL 4 MG PO TABS
4.0000 mg | ORAL_TABLET | Freq: Once | ORAL | Status: AC
  Administered 2013-10-26: 4 mg via ORAL
  Filled 2013-10-26: qty 1

## 2013-10-26 MED ORDER — PANTOPRAZOLE SODIUM 40 MG IV SOLR
40.0000 mg | Freq: Once | INTRAVENOUS | Status: AC
  Administered 2013-10-26: 40 mg via INTRAVENOUS
  Filled 2013-10-26: qty 40

## 2013-10-26 MED ORDER — PROMETHAZINE HCL 25 MG PO TABS
25.0000 mg | ORAL_TABLET | Freq: Once | ORAL | Status: AC
  Administered 2013-10-26: 25 mg via ORAL
  Filled 2013-10-26 (×2): qty 1

## 2013-10-26 MED ORDER — OXYCODONE-ACETAMINOPHEN 5-325 MG PO TABS
1.0000 | ORAL_TABLET | Freq: Once | ORAL | Status: AC
  Administered 2013-10-26: 1 via ORAL
  Filled 2013-10-26: qty 1

## 2013-10-26 MED ORDER — IOHEXOL 300 MG/ML  SOLN
100.0000 mL | Freq: Once | INTRAMUSCULAR | Status: AC | PRN
Start: 2013-10-26 — End: 2013-10-26
  Administered 2013-10-26: 100 mL via INTRAVENOUS

## 2013-10-26 MED ORDER — PROMETHAZINE HCL 25 MG PO TABS: 25.0000 mg | ORAL_TABLET | Freq: Four times a day (QID) | ORAL | Status: DC | PRN

## 2013-10-26 NOTE — ED Provider Notes (Signed)
23 year old female with history of a perforated ulcer approximately 16 months ago was awakened at 2:30 this morning with severe epigastric pain with some radiation to the back. She developed severe diarrhea and has also vomited once. Pain is improved by emesis or diarrhea. Nothing makes it better nothing makes it worse. Pain is similar to what she had with her perforated ulcer. She admits to not being compliant with her antiulcer regimen. On exam, she appears uncomfortable. There is marked tenderness in the epigastric area and bowel sounds are decreased. Because of history of a perforated viscus, she will be sent for CT. She is given initial dose of pantoprazole and is given IV fluids as well as morphine for pain and ondansetron for nausea.  I saw and evaluated the patient, reviewed the resident's note and I agree with the findings and plan.   Dione Boozeavid Marsh Heckler, MD 10/26/13 [phone]

## 2013-10-26 NOTE — ED Notes (Signed)
Resident at bedside.  

## 2013-10-26 NOTE — ED Notes (Signed)
Pt c/o abd cramping and diarrhea since 230 am this morning. sts she ate a normal meal last night, denies ETOH use. Denies others around her being sick. Denies nausea, sts she did vomit x 1 today. Denies blood in stools, sts "they are just very watery". Nad, skin warm and dry, resp e/u.

## 2013-10-26 NOTE — Discharge Instructions (Signed)
Abdominal Pain, Women °Abdominal (stomach, pelvic, or belly) pain can be caused by many things. It is important to tell your doctor: °· The location of the pain. °· Does it come and go or is it present all the time? °· Are there things that start the pain (eating certain foods, exercise)? °· Are there other symptoms associated with the pain (fever, nausea, vomiting, diarrhea)? °All of this is helpful to know when trying to find the cause of the pain. °CAUSES  °· Stomach: virus or bacteria infection, or ulcer. °· Intestine: appendicitis (inflamed appendix), regional ileitis (Crohn's disease), ulcerative colitis (inflamed colon), irritable bowel syndrome, diverticulitis (inflamed diverticulum of the colon), or cancer of the stomach or intestine. °· Gallbladder disease or stones in the gallbladder. °· Kidney disease, kidney stones, or infection. °· Pancreas infection or cancer. °· Fibromyalgia (pain disorder). °· Diseases of the female organs: °¨ Uterus: fibroid (non-cancerous) tumors or infection. °¨ Fallopian tubes: infection or tubal pregnancy. °¨ Ovary: cysts or tumors. °¨ Pelvic adhesions (scar tissue). °¨ Endometriosis (uterus lining tissue growing in the pelvis and on the pelvic organs). °¨ Pelvic congestion syndrome (female organs filling up with blood just before the menstrual period). °¨ Pain with the menstrual period. °¨ Pain with ovulation (producing an egg). °¨ Pain with an IUD (intrauterine device, birth control) in the uterus. °¨ Cancer of the female organs. °· Functional pain (pain not caused by a disease, may improve without treatment). °· Psychological pain. °· Depression. °DIAGNOSIS  °Your doctor will decide the seriousness of your pain by doing an examination. °· Blood tests. °· X-rays. °· Ultrasound. °· CT scan (computed tomography, special type of X-ray). °· MRI (magnetic resonance imaging). °· Cultures, for infection. °· Barium enema (dye inserted in the large intestine, to better view it with  X-rays). °· Colonoscopy (looking in intestine with a lighted tube). °· Laparoscopy (minor surgery, looking in abdomen with a lighted tube). °· Major abdominal exploratory surgery (looking in abdomen with a large incision). °TREATMENT  °The treatment will depend on the cause of the pain.  °· Many cases can be observed and treated at home. °· Over-the-counter medicines recommended by your caregiver. °· Prescription medicine. °· Antibiotics, for infection. °· Birth control pills, for painful periods or for ovulation pain. °· Hormone treatment, for endometriosis. °· Nerve blocking injections. °· Physical therapy. °· Antidepressants. °· Counseling with a psychologist or psychiatrist. °· Minor or major surgery. °HOME CARE INSTRUCTIONS  °· Do not take laxatives, unless directed by your caregiver. °· Take over-the-counter pain medicine only if ordered by your caregiver. Do not take aspirin because it can cause an upset stomach or bleeding. °· Try a clear liquid diet (broth or water) as ordered by your caregiver. Slowly move to a bland diet, as tolerated, if the pain is related to the stomach or intestine. °· Have a thermometer and take your temperature several times a day, and record it. °· Bed rest and sleep, if it helps the pain. °· Avoid sexual intercourse, if it causes pain. °· Avoid stressful situations. °· Keep your follow-up appointments and tests, as your caregiver orders. °· If the pain does not go away with medicine or surgery, you may try: °¨ Acupuncture. °¨ Relaxation exercises (yoga, meditation). °¨ Group therapy. °¨ Counseling. °SEEK MEDICAL CARE IF:  °· You notice certain foods cause stomach pain. °· Your home care treatment is not helping your pain. °· You need stronger pain medicine. °· You want your IUD removed. °· You feel faint or   lightheaded. °· You develop nausea and vomiting. °· You develop a rash. °· You are having side effects or an allergy to your medicine. °SEEK IMMEDIATE MEDICAL CARE IF:  °· Your  pain does not go away or gets worse. °· You have a fever. °· Your pain is felt only in portions of the abdomen. The right side could possibly be appendicitis. The left lower portion of the abdomen could be colitis or diverticulitis. °· You are passing blood in your stools (bright red or black tarry stools, with or without vomiting). °· You have blood in your urine. °· You develop chills, with or without a fever. °· You pass out. °MAKE SURE YOU:  °· Understand these instructions. °· Will watch your condition. °· Will get help right away if you are not doing well or get worse. °Document Released: 02/07/2007 Document Revised: 07/05/2011 Document Reviewed: 02/27/2009 °[employer]® Patient Information ©2015 [employer]. This information is not intended to replace advice given to you by your health care provider. Make sure you discuss any questions you have with your health care provider. ° °

## 2013-10-26 NOTE — ED Notes (Signed)
Pt reports that she started having severe abdominal pain and diarrhea at 3 am this morning. Prior to this time patient had no symptoms. Pt currently Ax4, NAD. Pt is tachycardic.

## 2013-10-26 NOTE — ED Notes (Signed)
Pt finished drinking oral CT contrast. CT made aware. 

## 2013-10-26 NOTE — ED Notes (Signed)
Pt reports burning sensation in her chest. Reports she has had nausea but no vomiting.

## 2013-10-26 NOTE — ED Notes (Signed)
Pt returned from radiology.

## 2013-10-27 NOTE — ED Provider Notes (Signed)
CSN: [id number]     Arrival date & time 10/26/13  1530 History   First MD Initiated Contact with Patient 10/26/13 1739     Chief Complaint  Patient presents with  . Abdominal Pain  . Diarrhea   23 y/o female with past medical history of perforated ulcer 16 months prior that presents with severe epigastric pain with sudden onset and burning. Patient states that after her surgery for the perforated ulcer that she has not been taking her prescribed medication because it made her feel drowsy.Patient states that the pain is worsened with PO intake. Patient has had associated severe non-bloody diarrhea and nausea with 1 episode of vomiting that was non-bloody non-bilious. Denies fever/chills, vaginal discharge, urinary symptoms.    (Consider location/radiation/quality/duration/timing/severity/associated sxs/prior Treatment) Patient is a 23 y.o. female presenting with abdominal pain.  Abdominal Pain Pain location:  Epigastric Pain quality: burning   Pain radiates to:  Back Pain severity:  Severe Onset quality:  Sudden Timing:  Constant Associated symptoms: nausea and vomiting   Associated symptoms: no chest pain, no constipation, no cough, no diarrhea, no dysuria, no shortness of breath and no vaginal discharge     Past Medical History  Diagnosis Date  . Hypoglycemia   . Ovarian cyst 2011  . Constipation   . Sickle cell trait   . History of dysmenorrhea   . Gastric ulcer    Past Surgical History  Procedure Laterality Date  . Laparoscopic ovarian cystectomy  2011  . Eye surgery      2004  . Laparotomy N/A 06/16/2012    Procedure: EXPLORATORY LAPAROTOMY;  Surgeon: Emelia LoronMatthew Wakefield, MD;  [facility];  Service: General;  Laterality: N/A;  Repair of duodenal ulcer  . Repair of perforated ulcer N/A 06/16/2012    Procedure: REPAIR OF PERFORATED ULCER;  Surgeon: Emelia LoronMatthew Wakefield, MD;  [facility];  Service: General;  Laterality: N/A;  duodenal   Family History  Problem Relation  Age of Onset  . Anesthesia problems Neg Hx   . Hearing loss Neg Hx   . Breast cancer Paternal Grandmother   . Brain cancer     History  Substance Use Topics  . Smoking status: Never Smoker   . Smokeless tobacco: Never Used  . Alcohol Use: No   OB History   Grav Para Term Preterm Abortions TAB SAB Ect Mult Living   1 1 1  0 0 0 0 0 0 1     Review of Systems  Constitutional: Negative for activity change.  HENT: Negative for congestion.   Respiratory: Negative for cough and shortness of breath.   Cardiovascular: Negative for chest pain and leg swelling.  Gastrointestinal: Positive for nausea, vomiting and abdominal pain. Negative for diarrhea, constipation, blood in stool and abdominal distention.  Genitourinary: Negative for dysuria, flank pain and vaginal discharge.  Musculoskeletal: Negative for back pain.  Skin: Negative for color change.  Neurological: Negative for syncope and headaches.  Psychiatric/Behavioral: Negative for agitation.  All other systems reviewed and are negative.     Allergies  Nsaids  Home Medications   Prior to Admission medications   Medication Sig Start Date End Date Taking? Authorizing Provider  acetaminophen (TYLENOL) 325 MG tablet Take 650 mg by mouth every 6 (six) hours as needed for moderate pain.   Yes Historical Provider, MD  medroxyPROGESTERone (DEPO-PROVERA) 150 MG/ML injection Inject 150 mg into the muscle every 3 (three) months.   Yes Historical Provider, MD  omeprazole (PRILOSEC) 20 MG capsule Take  1 capsule (20 mg total) by mouth daily. 10/26/13   Clement SayresStaci Delfin Squillace, MD  promethazine (PHENERGAN) 25 MG tablet Take 1 tablet (25 mg total) by mouth every 6 (six) hours as needed for nausea or vomiting. 10/26/13   Clement SayresStaci Sojourner Behringer, MD   BP 105/59  Pulse 105  Temp(Src) 98.6 F (37 C) (Oral)  Resp 28  Ht 5\' 3"  (1.6 m)  Wt 160 lb (72.576 kg)  BMI 28.35 kg/m2  SpO2 99% Physical Exam  Constitutional: She is oriented to person, place, and time. She  appears well-developed.  HENT:  Head: Normocephalic.  Eyes: Pupils are equal, round, and reactive to light.  Neck: Neck supple.  Cardiovascular: Normal rate.  Exam reveals no gallop and no friction rub.   No murmur heard. Pulmonary/Chest: Effort normal and breath sounds normal. No respiratory distress.  Abdominal: Soft. She exhibits no distension. There is tenderness in the epigastric area. There is guarding. There is no rigidity, no rebound, no CVA tenderness, no tenderness at McBurney's point and negative Murphy's sign.  Musculoskeletal: She exhibits no edema.  Neurological: She is alert and oriented to person, place, and time.  Skin: Skin is warm.  Psychiatric: She has a normal mood and affect.    ED Course  Procedures (including critical care time) Labs Review Labs Reviewed  CBC WITH DIFFERENTIAL - Abnormal; Notable for the following:    Neutrophils Relative % 89 (*)    Neutro Abs 8.3 (*)    Lymphocytes Relative 5 (*)    Lymphs Abs 0.5 (*)    All other components within normal limits  URINALYSIS, ROUTINE W REFLEX MICROSCOPIC - Abnormal; Notable for the following:    APPearance CLOUDY (*)    Leukocytes, UA LARGE (*)    All other components within normal limits  URINE MICROSCOPIC-ADD ON - Abnormal; Notable for the following:    Squamous Epithelial / LPF MANY (*)    Bacteria, UA MANY (*)    All other components within normal limits  COMPREHENSIVE METABOLIC PANEL  LIPASE, BLOOD  POC URINE PREG, ED  I-STAT TROPOININ, ED    Imaging Review Ct Abdomen Pelvis W Contrast  10/26/2013   CLINICAL DATA:  Epigastric abdominal pain. Prior history of perforated ulcer repair in February, 2014. Surgical history also includes ovarian cyst resection in 2011.  EXAM: CT ABDOMEN AND PELVIS WITH CONTRAST  TECHNIQUE: Multidetector CT imaging of the abdomen and pelvis was performed using the standard protocol following bolus administration of intravenous contrast.  CONTRAST:  100mL OMNIPAQUE IOHEXOL  300 MG/ML IV. Oral contrast was also administered.  COMPARISON:  09/03/2012, 06/16/2012, 06/05/2012, 11/18/2010.  FINDINGS: Surgical suture material along the greater curvature of the gastric antrum related to prior ulcer repair. No acute inflammatory changes involving the upper abdomen to suggest active peptic ulcer disease currently. Stomach mildly distended with oral contrast material. Duodenum decompressed. Wall thickening involving a segment of small bowel in the right upper quadrant, likely a distal jejunal or proximal ileal loop. Remainder of the small bowel normal in appearance. Entire colon decompressed and unremarkable. Normal gas-filled appendix in the right upper pelvis. No ascites. No free intraperitoneal air.  Normal-appearing liver with anatomic variant in that the left lobe extends well across the midline into the left upper quadrant. Normal-appearing spleen, pancreas, adrenal glands, and left kidney. Approximate 3.3 x 2.3 x 2.4 cm simple cyst arising from the upper pole of the otherwise normal-appearing right kidney. Normal vascular structures. No significant lymphadenopathy.  Normal appearing uterus. Cyst in the  left lower pelvis which may be separate from the ovary and measures approximately 3.5 x 3.0 x 2.7 cm. No right adnexal cysts or masses. Urinary bladder unremarkable.  Bone window images unremarkable. Visualized lung bases clear. Heart size normal.  IMPRESSION: 1. Wall thickening involving a several cm loop of small bowel in the right upper quadrant, likely distal jejunum or proximal ileum, consistent with enteritis. 2. No evidence of active peptic ulcer disease currently. 3. Approximate 3.5 cm cyst in the left lower pelvis which may be separate from the left ovary and therefore represent a paraovarian cyst.   Electronically Signed   By: Hulan Saas M.D.   On: 10/26/2013 20:38     EKG Interpretation None      MDM   Final diagnoses:  Epigastric pain   23 y/o female with past  history of perforated ulcer presents with severe epigastric pain after not being compliant with medications. Has associated diarrhea and vomiting that does not relieve the pain.   Patient evaluated with CMP, CBC, lipase, u preg and U/A. Patient only found to have large leuks on urine but with large epithelial cells likely a dirty catch.   Patient further evaluated with CT abd/pelvis secondary to past history of perforated ulcer. The exam noted signs of enteritis but not signs of repeat perforation. Patient treated for pain the department and given protonix. Patient further instructed to start a course of omeprazole and instructed about the importance of staying on controller medications. Patient discharged home with instructions to follow-up with PCP.      Clement Sayres, MD 10/27/13 1250

## 2013-12-11 ENCOUNTER — Inpatient Hospital Stay (HOSPITAL_COMMUNITY): Payer: Medicaid Other

## 2013-12-11 ENCOUNTER — Inpatient Hospital Stay (HOSPITAL_COMMUNITY)
Admission: AD | Admit: 2013-12-11 | Discharge: 2013-12-11 | Disposition: A | Discharge status: Home / Self Care | Payer: Medicaid Other | Source: Ambulatory Visit | Attending: Obstetrics | Admitting: Obstetrics

## 2013-12-11 ENCOUNTER — Encounter (HOSPITAL_COMMUNITY): Payer: Self-pay | Admitting: *Deleted

## 2013-12-11 DIAGNOSIS — N949 Unspecified condition associated with female genital organs and menstrual cycle: Secondary | ICD-10-CM | POA: Insufficient documentation

## 2013-12-11 DIAGNOSIS — N3000 Acute cystitis without hematuria: Secondary | ICD-10-CM

## 2013-12-11 DIAGNOSIS — N938 Other specified abnormal uterine and vaginal bleeding: Secondary | ICD-10-CM | POA: Insufficient documentation

## 2013-12-11 DIAGNOSIS — N390 Urinary tract infection, site not specified: Secondary | ICD-10-CM | POA: Insufficient documentation

## 2013-12-11 LAB — URINALYSIS, ROUTINE W REFLEX MICROSCOPIC
Bilirubin Urine: NEGATIVE
Glucose, UA: NEGATIVE mg/dL
Ketones, ur: NEGATIVE mg/dL
Nitrite: POSITIVE — AB
PH: 6 (ref 5.0–8.0)
Protein, ur: 30 mg/dL — AB
Specific Gravity, Urine: 1.015 (ref 1.005–1.030)
Urobilinogen, UA: 0.2 mg/dL (ref 0.0–1.0)

## 2013-12-11 LAB — CBC
HCT: 39.9 % (ref 36.0–46.0)
Hemoglobin: 14 g/dL (ref 12.0–15.0)
MCH: 28.2 pg (ref 26.0–34.0)
MCHC: 35.1 g/dL (ref 30.0–36.0)
MCV: 80.3 fL (ref 78.0–100.0)
Platelets: 276 10*3/uL (ref 150–400)
RBC: 4.97 MIL/uL (ref 3.87–5.11)
RDW: 13.1 % (ref 11.5–15.5)
WBC: 8.9 10*3/uL (ref 4.0–10.5)

## 2013-12-11 LAB — WET PREP, GENITAL
TRICH WET PREP: NONE SEEN
YEAST WET PREP: NONE SEEN

## 2013-12-11 LAB — URINE MICROSCOPIC-ADD ON

## 2013-12-11 LAB — POCT PREGNANCY, URINE: Preg Test, Ur: NEGATIVE

## 2013-12-11 MED ORDER — ONDANSETRON HCL 4 MG PO TABS: 4.0000 mg | ORAL_TABLET | Freq: Four times a day (QID) | ORAL | Status: DC

## 2013-12-11 MED ORDER — CIPROFLOXACIN HCL 250 MG PO TABS: 250.0000 mg | ORAL_TABLET | Freq: Two times a day (BID) | ORAL | Status: DC

## 2013-12-11 MED ORDER — PHENAZOPYRIDINE HCL 200 MG PO TABS: 200.0000 mg | ORAL_TABLET | Freq: Three times a day (TID) | ORAL | Status: DC

## 2013-12-11 MED ORDER — ONDANSETRON 8 MG PO TBDP
8.0000 mg | ORAL_TABLET | Freq: Once | ORAL | Status: AC
  Administered 2013-12-11: 8 mg via ORAL
  Filled 2013-12-11: qty 1

## 2013-12-11 MED ORDER — OXYCODONE-ACETAMINOPHEN 5-325 MG PO TABS
2.0000 | ORAL_TABLET | Freq: Once | ORAL | Status: AC
  Administered 2013-12-11: 2 via ORAL
  Filled 2013-12-11: qty 2

## 2013-12-11 NOTE — MAU Note (Signed)
Vaginal bleeding for the past 3 weeks.  Having dime size clots.  Having pelvic pain coming and going but today unbearable since 6 pm.

## 2013-12-11 NOTE — MAU Provider Note (Signed)
History     CSN: [id number]  Arrival date and time: 12/11/13 [id number]   First Provider Initiated Contact with Patient 12/11/13 2104      Chief Complaint  Patient presents with  . Vaginal Bleeding   HPI 23 y.o. G1P1001 with vaginal bleeding and pelvic pain x 3 weeks, pain became "unbearable" tonight around 6 PM. States she took some Tylenol around 5:40 PM with no relief. Reports vaginal bleeding is heavy with dime sized clots and has been increasing over the last 3 weeks. Uses DMPA for birth control, last injection in July, usually has no bleeding.   Past Medical History  Diagnosis Date  . Hypoglycemia   . Ovarian cyst 2011  . Constipation   . Sickle cell trait   . History of dysmenorrhea   . Gastric ulcer     Past Surgical History  Procedure Laterality Date  . Laparoscopic ovarian cystectomy  2011  . Eye surgery      2004  . Laparotomy N/A 06/16/2012    Procedure: EXPLORATORY LAPAROTOMY;  Surgeon: Emelia LoronMatthew Wakefield, MD;  [facility];  Service: General;  Laterality: N/A;  Repair of duodenal ulcer  . Repair of perforated ulcer N/A 06/16/2012    Procedure: REPAIR OF PERFORATED ULCER;  Surgeon: Emelia LoronMatthew Wakefield, MD;  [facility];  Service: General;  Laterality: N/A;  duodenal    Family History  Problem Relation Age of Onset  . Anesthesia problems Neg Hx   . Hearing loss Neg Hx   . Breast cancer Paternal Grandmother   . Brain cancer      History  Substance Use Topics  . Smoking status: Never Smoker   . Smokeless tobacco: Never Used  . Alcohol Use: No    Allergies:  Allergies  Allergen Reactions  . Nsaids     Diagnosed with a perforated duodenal ulcer due to NSAIDS    Prescriptions prior to admission  Medication Sig Dispense Refill  . acetaminophen (TYLENOL) 325 MG tablet Take 650 mg by mouth every 6 (six) hours as needed for moderate pain.      . medroxyPROGESTERone (DEPO-PROVERA) 150 MG/ML injection Inject 150 mg into the muscle every 3 (three) months.       Marland Kitchen. omeprazole (PRILOSEC) 20 MG capsule Take 1 capsule (20 mg total) by mouth daily.  30 capsule  0  . promethazine (PHENERGAN) 25 MG tablet Take 1 tablet (25 mg total) by mouth every 6 (six) hours as needed for nausea or vomiting.  30 tablet  0    Review of Systems  Constitutional: Negative.   Respiratory: Negative.   Cardiovascular: Negative.   Gastrointestinal: Positive for nausea and abdominal pain. Negative for vomiting, diarrhea and constipation.  Genitourinary: Negative for dysuria, urgency, frequency, hematuria and flank pain.       Negative for vaginal discharge, Positive for pelvic pain, vaginal bleeding  Musculoskeletal: Negative.   Neurological: Negative.   Psychiatric/Behavioral: Negative.    Physical Exam   Blood pressure 129/83, pulse 99, temperature 98.3 F (36.8 C), temperature source Oral, resp. rate 18, height 5\' 3"  (1.6 m), weight 155 lb 3 oz (70.393 kg), last menstrual period 03/26/2013, SpO2 100.00%, not currently breastfeeding.  Physical Exam  Nursing note and vitals reviewed. Constitutional: She is oriented to person, place, and time. She appears well-developed and well-nourished. No distress (uncomfortable and anxious appearing).  Cardiovascular: Normal rate.   Respiratory: Effort normal.  GI: Soft. There is no tenderness.  Genitourinary: There is no rash, tenderness or lesion on  the right labia. There is no rash, tenderness or lesion on the left labia. Uterus is not enlarged and not tender. Cervix exhibits no motion tenderness and no discharge. Right adnexum displays tenderness and fullness. Right adnexum displays no mass. Left adnexum displays no mass, no tenderness and no fullness. There is bleeding (moderate) around the vagina. No erythema or tenderness around the vagina.  Musculoskeletal: Normal range of motion.  Neurological: She is alert and oriented to person, place, and time.  Skin: Skin is warm and dry.  Psychiatric: She has a normal mood and affect.     MAU Course  Procedures  Results for orders placed during the hospital encounter of 12/11/13 (from the past 24 hour(s))  CBC     Status: None   Collection Time    12/11/13  8:55 PM      Result Value Ref Range   WBC 8.9  4.0 - 10.5 K/uL   RBC 4.97  3.87 - 5.11 MIL/uL   Hemoglobin 14.0  12.0 - 15.0 g/dL   HCT 57.8  46.9 - 62.9 %   MCV 80.3  78.0 - 100.0 fL   MCH 28.2  26.0 - 34.0 pg   MCHC 35.1  30.0 - 36.0 g/dL   RDW 52.8  41.3 - 24.4 %   Platelets 276  150 - 400 K/uL  URINALYSIS, ROUTINE W REFLEX MICROSCOPIC     Status: Abnormal   Collection Time    12/11/13  9:03 PM      Result Value Ref Range   Color, Urine RED (*) YELLOW   APPearance CLOUDY (*) CLEAR   Specific Gravity, Urine 1.015  1.005 - 1.030   pH 6.0  5.0 - 8.0   Glucose, UA NEGATIVE  NEGATIVE mg/dL   Hgb urine dipstick LARGE (*) NEGATIVE   Bilirubin Urine NEGATIVE  NEGATIVE   Ketones, ur NEGATIVE  NEGATIVE mg/dL   Protein, ur 30 (*) NEGATIVE mg/dL   Urobilinogen, UA 0.2  0.0 - 1.0 mg/dL   Nitrite POSITIVE (*) NEGATIVE   Leukocytes, UA TRACE (*) NEGATIVE  URINE MICROSCOPIC-ADD ON     Status: None   Collection Time    12/11/13  9:03 PM      Result Value Ref Range   Squamous Epithelial / LPF RARE  RARE   WBC, UA 0-2  <3 WBC/hpf   RBC / HPF TOO NUMEROUS TO COUNT  <3 RBC/hpf   Bacteria, UA RARE  RARE  POCT PREGNANCY, URINE     Status: None   Collection Time    12/11/13  9:06 PM      Result Value Ref Range   Preg Test, Ur NEGATIVE  NEGATIVE  WET PREP, GENITAL     Status: Abnormal   Collection Time    12/11/13  9:08 PM      Result Value Ref Range   Yeast Wet Prep HPF POC NONE SEEN  NONE SEEN   Trich, Wet Prep NONE SEEN  NONE SEEN   Clue Cells Wet Prep HPF POC FEW (*) NONE SEEN   WBC, Wet Prep HPF POC FEW (*) NONE SEEN   Zofran ODT 8 mg for nausea Percocet 5/325, 2 tabs PO x 1 for pain  Pelvic u/s pending  Assessment and Plan  Care assumed by Joseph Berkshire, PA-C  [phone] - Care assumed from Georges Mouse, CNM. [place] results pending  US Transvaginal Non-ob  12/11/2013   CLINICAL DATA:  Pelvic pain.  Abnormal uterine bleeding.  EXAM: TRANSABDOMINAL  AND TRANSVAGINAL ULTRASOUND OF PELVIS  TECHNIQUE: Both transabdominal and transvaginal ultrasound examinations of the pelvis were performed. Transabdominal technique was performed for global imaging of the pelvis including uterus, ovaries, adnexal regions, and pelvic cul-de-sac. It was necessary to proceed with endovaginal exam following the transabdominal exam to visualize the ovaries and endometrium.  COMPARISON:  None  FINDINGS: Uterus  Measurements: 8.5 x 4.4 x 4.8 cm. No fibroids or other mass visualized.  Endometrium  Thickness: 5.0 mm.  No focal abnormality visualized.  Right ovary  Measurements: 3.3 x 2.3 x 2.4 cm. Normal appearance/no adnexal mass.  Left ovary  Measurements: 3.0 x 1.6 x 2.3 cm. Normal appearance/no adnexal mass.  Other findings  No free pelvic fluid collections.  IMPRESSION: Normal pelvic ultrasound examination.   Electronically Signed   By: Loralie Champagne M.D.   On: 12/11/2013 22:40   US Pelvis Complete  12/11/2013   CLINICAL DATA:  Pelvic pain.  Abnormal uterine bleeding.  EXAM: TRANSABDOMINAL AND TRANSVAGINAL ULTRASOUND OF PELVIS  TECHNIQUE: Both transabdominal and transvaginal ultrasound examinations of the pelvis were performed. Transabdominal technique was performed for global imaging of the pelvis including uterus, ovaries, adnexal regions, and pelvic cul-de-sac. It was necessary to proceed with endovaginal exam following the transabdominal exam to visualize the ovaries and endometrium.  COMPARISON:  None  FINDINGS: Uterus  Measurements: 8.5 x 4.4 x 4.8 cm. No fibroids or other mass visualized.  Endometrium  Thickness: 5.0 mm.  No focal abnormality visualized.  Right ovary  Measurements: 3.3 x 2.3 x 2.4 cm. Normal appearance/no adnexal mass.  Left ovary  Measurements: 3.0 x 1.6 x 2.3 cm. Normal appearance/no adnexal mass.  Other  findings  No free pelvic fluid collections.  IMPRESSION: Normal pelvic ultrasound examination.   Electronically Signed   By: Loralie Champagne M.D.   On: 12/11/2013 22:40   MDM Patient reports resolution of nausea and improvement in pain   A: UTI Irregular bleeding with Depo Provera  P: Discharge home Bleeding precautions discussed Rx for Cipro, Pyridium and Zofran given to patient Warning signs for pyelonephritis discussed Patient advised to follow-up with [facility] to discuss change or birth control if unhappy with bleeding profile Patient may return to MAU as needed or if her condition were to change or worsen  Freddi Starr, PA-C  12/11/2013, 10:56 PM

## 2013-12-11 NOTE — Discharge Instructions (Signed)
Urinary Tract Infection A urinary tract infection (UTI) can occur any place along the urinary tract. The tract includes the kidneys, ureters, bladder, and urethra. A type of germ called bacteria often causes a UTI. UTIs are often helped with antibiotic medicine.  HOME CARE   If given, take antibiotics as told by your doctor. Finish them even if you start to feel better.  Drink enough fluids to keep your pee (urine) clear or pale yellow.  Avoid tea, drinks with caffeine, and bubbly (carbonated) drinks.  Pee often. Avoid holding your pee in for a long time.  Pee before and after having sex (intercourse).  Wipe from front to back after you poop (bowel movement) if you are a woman. Use each tissue only once. GET HELP RIGHT AWAY IF:   You have back pain.  You have lower belly (abdominal) pain.  You have chills.  You feel sick to your stomach (nauseous).  You throw up (vomit).  Your burning or discomfort with peeing does not go away.  You have a fever.  Your symptoms are not better in 3 days. MAKE SURE YOU:   Understand these instructions.  Will watch your condition.  Will get help right away if you are not doing well or get worse. Document Released: 09/29/2007 Document Revised: 01/05/2012 Document Reviewed: 11/11/2011 [facility] Patient Information 2015 [place]. This information is not intended to replace advice given to you by your health care provider. Make sure you discuss any questions you have with your health care provider. Contraception Choices Birth control (contraception) is the use of any methods or devices to stop pregnancy from happening. Below are some methods to help avoid pregnancy. HORMONAL BIRTH CONTROL  A small tube put under the skin of the upper arm (implant). The tube can stay in place for 3 years. The implant must be taken out after 3 years.  Shots given every 3 months.  Pills taken every day.  Patches that are changed once a week.  A ring  put into the vagina (vaginal ring). The ring is left in place for 3 weeks and removed for 1 week. Then, a new ring is put in the vagina.  Emergency birth control pills taken after unprotected sex (intercourse). BARRIER BIRTH CONTROL   A thin covering worn on the penis (female condom) during sex.  A soft, loose covering put into the vagina (female condom) before sex.  A rubber bowl that sits over the cervix (diaphragm). The bowl must be made for you. The bowl is put into the vagina before sex. The bowl is left in place for 6 to 8 hours after sex.  A small, soft cup that fits over the cervix (cervical cap). The cup must be made for you. The cup can be left in place for 48 hours after sex.  A sponge that is put into the vagina before sex.  A chemical that kills or stops sperm from getting into the cervix and uterus (spermicide). The chemical may be a cream, jelly, foam, or pill. INTRAUTERINE (IUD) BIRTH CONTROL   IUD birth control is a small, T-shaped piece of plastic. The plastic is put inside the uterus. There are 2 types of IUD:  Copper IUD. The IUD is covered in copper wire. The copper makes a fluid that kills sperm. It can stay in place for 10 years.  Hormone IUD. The hormone stops pregnancy from happening. It can stay in place for 5 years. PERMANENT METHODS  When the woman has her fallopian  tubes sealed, tied, or blocked during surgery. This stops the egg from traveling to the uterus.  The doctor places a small coil or insert into each fallopian tube. This causes scar tissue to form and blocks the fallopian tubes.  When the female has the tubes that carry sperm tied off (vasectomy). NATURAL FAMILY PLANNING BIRTH CONTROL   Natural family planning means not having sex or using barrier birth control on the days the woman could become pregnant.  Use a calendar to keep track of the length of each period and know the days she can get pregnant.  Avoid sex during ovulation.  Use a  thermometer to measure body temperature. Also watch for symptoms of ovulation.  Time sex to be after the woman has ovulated. Use condoms to help protect yourself against sexually transmitted infections (STIs). Do this no matter what type of birth control you use. Talk to your doctor about which type of birth control is best for you. Document Released: 02/07/2009 Document Revised: 04/17/2013 Document Reviewed: 11/01/2012 [facility] Patient Information 2015 [place], [place]. This information is not intended to replace advice given to you by your health care provider. Make sure you discuss any questions you have with your health care provider.

## 2013-12-12 ENCOUNTER — Other Ambulatory Visit: Payer: Self-pay | Admitting: Obstetrics

## 2013-12-12 LAB — GC/CHLAMYDIA PROBE AMP
CT PROBE, AMP APTIMA: NEGATIVE
GC PROBE AMP APTIMA: NEGATIVE

## 2014-02-08 ENCOUNTER — Encounter (HOSPITAL_COMMUNITY): Payer: Self-pay | Admitting: Emergency Medicine

## 2014-02-08 ENCOUNTER — Emergency Department (HOSPITAL_COMMUNITY)
Admission: EM | Admit: 2014-02-08 | Discharge: 2014-02-08 | Disposition: A | Discharge status: Home / Self Care | Payer: Medicaid Other | Attending: Emergency Medicine | Admitting: Emergency Medicine

## 2014-02-08 DIAGNOSIS — Z862 Personal history of diseases of the blood and blood-forming organs and certain disorders involving the immune mechanism: Secondary | ICD-10-CM | POA: Insufficient documentation

## 2014-02-08 DIAGNOSIS — Z8639 Personal history of other endocrine, nutritional and metabolic disease: Secondary | ICD-10-CM | POA: Insufficient documentation

## 2014-02-08 DIAGNOSIS — Z79899 Other long term (current) drug therapy: Secondary | ICD-10-CM | POA: Insufficient documentation

## 2014-02-08 DIAGNOSIS — Z8742 Personal history of other diseases of the female genital tract: Secondary | ICD-10-CM | POA: Insufficient documentation

## 2014-02-08 DIAGNOSIS — R590 Localized enlarged lymph nodes: Secondary | ICD-10-CM | POA: Insufficient documentation

## 2014-02-08 DIAGNOSIS — B009 Herpesviral infection, unspecified: Secondary | ICD-10-CM | POA: Insufficient documentation

## 2014-02-08 DIAGNOSIS — R05 Cough: Secondary | ICD-10-CM | POA: Insufficient documentation

## 2014-02-08 DIAGNOSIS — Z8719 Personal history of other diseases of the digestive system: Secondary | ICD-10-CM | POA: Insufficient documentation

## 2014-02-08 MED ORDER — ACYCLOVIR 400 MG PO TABS: 400.0000 mg | ORAL_TABLET | Freq: Three times a day (TID) | ORAL | Status: DC

## 2014-02-08 NOTE — ED Provider Notes (Signed)
CSN: [id number]     Arrival date & time 02/08/14  1925 History  This chart was scribed for non-physician practitioner, Roxy Horsemanobert Jaelin Devincentis, PA-C working with Gilda Creasehristopher J. Pollina, * by Angelene GiovanniEmmanuella Mensah, ED Scribe. The patient was seen in room TR10C/TR10C and the patient's care was started at 8:43 PM    Chief Complaint  Patient presents with  . Neck Pain   The history is provided by the patient. No language interpreter was used.   HPI Comments: [name] is a 23 y.o. female who presents to the Emergency Department complaining of a painful swollen lymph nodes on the back of right neck onset 3 days ago. There is associated acute blisters on top of mouth and cough onset yesterday. She has a history of canker sores. Patient states that symptoms are similar to previous lymph nodes to the right side of the back of neck. The patient denies sore throat and blistering of the mouth. No alleviating factors noted. Patient is currently on depo.     Past Medical History  Diagnosis Date  . Hypoglycemia   . Ovarian cyst 2011  . Constipation   . Sickle cell trait   . History of dysmenorrhea   . Gastric ulcer    Past Surgical History  Procedure Laterality Date  . Laparoscopic ovarian cystectomy  2011  . Eye surgery      2004  . Laparotomy N/A 06/16/2012    Procedure: EXPLORATORY LAPAROTOMY;  Surgeon: Emelia LoronMatthew Wakefield, MD;  [facility];  Service: General;  Laterality: N/A;  Repair of duodenal ulcer  . Repair of perforated ulcer N/A 06/16/2012    Procedure: REPAIR OF PERFORATED ULCER;  Surgeon: Emelia LoronMatthew Wakefield, MD;  [facility];  Service: General;  Laterality: N/A;  duodenal   Family History  Problem Relation Age of Onset  . Anesthesia problems Neg Hx   . Hearing loss Neg Hx   . Breast cancer Paternal Grandmother   . Sherry cancer     History  Substance Use Topics  . Smoking status: Never Smoker   . Smokeless tobacco: Never Used  . Alcohol Use: No   OB History   Grav Para  Term Preterm Abortions TAB SAB Ect Mult Living   1 1 1  0 0 0 0 0 0 1     Review of Systems  Constitutional: Negative for chills and fatigue.  HENT: Positive for mouth sores. Negative for sore throat.   Respiratory: Positive for cough.       Allergies  Nsaids  Home Medications   Prior to Admission medications   Medication Sig Start Date End Date Taking? Authorizing Provider  acetaminophen (TYLENOL) 325 MG tablet Take 650 mg by mouth every 6 (six) hours as needed for moderate pain.    Historical Provider, MD  ciprofloxacin (CIPRO) 250 MG tablet Take 1 tablet (250 mg total) by mouth every 12 (twelve) hours. 12/11/13   Marny LowensteinJulie N Wenzel, PA-C  medroxyPROGESTERone (DEPO-PROVERA) 150 MG/ML injection Inject 150 mg into the muscle every 3 (three) months.    Historical Provider, MD  omeprazole (PRILOSEC) 20 MG capsule Take 1 capsule (20 mg total) by mouth daily. 10/26/13   Clement SayresStaci Shepard, MD  ondansetron (ZOFRAN) 4 MG tablet Take 1 tablet (4 mg total) by mouth every 6 (six) hours. 12/11/13   Marny LowensteinJulie N Wenzel, PA-C  phenazopyridine (PYRIDIUM) 200 MG tablet Take 1 tablet (200 mg total) by mouth 3 (three) times daily. 12/11/13   Marny LowensteinJulie N Wenzel, PA-C  promethazine (PHENERGAN) 25  MG tablet Take 1 tablet (25 mg total) by mouth every 6 (six) hours as needed for nausea or vomiting. 10/26/13   Clement SayresStaci Shepard, MD   BP 122/70  Pulse 109  Temp(Src) 98.5 F (36.9 C)  Resp 18  SpO2 100% Physical Exam  Nursing note and vitals reviewed. Constitutional: She is oriented to person, place, and time. She appears well-developed and well-nourished. No distress.  HENT:  Head: Normocephalic and atraumatic.  Several small vesicular legions on erythematous base located on roof of mouth characteristic of herpes.  Mild posterior cervical lymphadenopathy  No evidence of cellulitis, abscess, or other infection.    Eyes: Conjunctivae and EOM are normal.  Neck: Neck supple. No tracheal deviation present.  Cardiovascular: Normal  rate.   Pulmonary/Chest: Effort normal. No respiratory distress.  Musculoskeletal: Normal range of motion.  Neurological: She is alert and oriented to person, place, and time.  Skin: Skin is warm and dry.  Psychiatric: She has a normal mood and affect. Her behavior is normal.    ED Course  Procedures (including critical care time) DIAGNOSTIC STUDIES: Oxygen Saturation is 100% on RA, normal by my interpretation.    COORDINATION OF CARE: 8:52 PM- Pt advised of plan for treatment and pt agrees.  Labs Review Labs Reviewed - No data to display  Imaging Review No results found.   EKG Interpretation None      MDM   Final diagnoses:  Herpes    Patient with herpes on her her mouth, appeared yesterday, will treat with acyclovir. Recommend PCP followup. Nontoxic-appearing. Patient is stable and ready for discharge.  I personally performed the services described in this documentation, which was scribed in my presence. The recorded information has been reviewed and is accurate.     Roxy Horsemanobert Michella Detjen, PA-C 02/08/14 2059

## 2014-02-08 NOTE — ED Notes (Signed)
The pt is c/o  Swollen lymph nodes  Around her neck and  Jaw since Tuesday and the roof of her mouth is raw

## 2014-02-08 NOTE — Discharge Instructions (Signed)
Herpes Simplex Herpes simplex is generally classified as Type 1 or Type 2. Type 1 is generally the type that is responsible for cold sores. Type 2 is generally associated with sexually transmitted diseases. We now know that most of the thoughts on these viruses are inaccurate. We find that HSV1 is also present genitally and HSV2 can be present orally, but this will vary in different locations of the world. Herpes simplex is usually detected by doing a culture. Blood tests are also available for this virus; however, the accuracy is often not as good.  PREPARATION FOR TEST No preparation or fasting is necessary. NORMAL FINDINGS  No virus present  No HSV antigens or antibodies present Ranges for normal findings may vary among different laboratories and hospitals. You should always check with your doctor after having lab work or other tests done to discuss the meaning of your test results and whether your values are considered within normal limits. MEANING OF TEST  Your caregiver will go over the test results with you and discuss the importance and meaning of your results, as well as treatment options and the need for additional tests if necessary. OBTAINING THE TEST RESULTS  It is your responsibility to obtain your test results. Ask the lab or department performing the test when and how you will get your results. Document Released: 05/15/2004 Document Revised: 07/05/2011 Document Reviewed: 03/23/2008 [employer] Patient Information 2015 [employer], [employer]. This information is not intended to replace advice given to you by your health care provider. Make sure you discuss any questions you have with your health care provider.  

## 2014-02-08 NOTE — ED Notes (Signed)
Pt presents with a "swollen lymph nodes" starting Tuesday. Pt seen here 2 months ago for one to her Left posterior neck but states it subsided, returned Tuesday with another one behind her Right ear, coughing up yellow phlegm, and a raw spot to the roof of her mouth.

## 2014-02-09 NOTE — ED Provider Notes (Signed)
Medical screening examination/treatment/procedure(s) were performed by non-physician practitioner and as supervising physician I was immediately available for consultation/collaboration.  Christopher J. Pollina, MD 02/09/14 1702 

## 2014-02-25 ENCOUNTER — Encounter (HOSPITAL_COMMUNITY): Payer: Self-pay | Admitting: Emergency Medicine

## 2014-05-12 ENCOUNTER — Encounter (HOSPITAL_COMMUNITY): Payer: Self-pay | Admitting: Emergency Medicine

## 2014-05-12 ENCOUNTER — Emergency Department (HOSPITAL_COMMUNITY): Payer: Medicaid Other

## 2014-05-12 ENCOUNTER — Emergency Department (HOSPITAL_COMMUNITY)
Admission: EM | Admit: 2014-05-12 | Discharge: 2014-05-12 | Disposition: A | Discharge status: Home / Self Care | Payer: Medicaid Other | Attending: Emergency Medicine | Admitting: Emergency Medicine

## 2014-05-12 DIAGNOSIS — R791 Abnormal coagulation profile: Secondary | ICD-10-CM | POA: Insufficient documentation

## 2014-05-12 DIAGNOSIS — Z8719 Personal history of other diseases of the digestive system: Secondary | ICD-10-CM | POA: Insufficient documentation

## 2014-05-12 DIAGNOSIS — Z8639 Personal history of other endocrine, nutritional and metabolic disease: Secondary | ICD-10-CM | POA: Insufficient documentation

## 2014-05-12 DIAGNOSIS — Z862 Personal history of diseases of the blood and blood-forming organs and certain disorders involving the immune mechanism: Secondary | ICD-10-CM | POA: Insufficient documentation

## 2014-05-12 DIAGNOSIS — F419 Anxiety disorder, unspecified: Secondary | ICD-10-CM | POA: Insufficient documentation

## 2014-05-12 DIAGNOSIS — Z8742 Personal history of other diseases of the female genital tract: Secondary | ICD-10-CM | POA: Insufficient documentation

## 2014-05-12 DIAGNOSIS — R0789 Other chest pain: Secondary | ICD-10-CM | POA: Insufficient documentation

## 2014-05-12 DIAGNOSIS — R7989 Other specified abnormal findings of blood chemistry: Secondary | ICD-10-CM

## 2014-05-12 LAB — COMPREHENSIVE METABOLIC PANEL
ALK PHOS: 68 U/L (ref 39–117)
ALT: 14 U/L (ref 0–35)
AST: 19 U/L (ref 0–37)
Albumin: 4 g/dL (ref 3.5–5.2)
Anion gap: 6 (ref 5–15)
BUN: 9 mg/dL (ref 6–23)
CALCIUM: 9.5 mg/dL (ref 8.4–10.5)
CHLORIDE: 108 meq/L (ref 96–112)
CO2: 24 mmol/L (ref 19–32)
Creatinine, Ser: 0.96 mg/dL (ref 0.50–1.10)
GFR calc Af Amer: >90 mL/min (ref >90)
GFR, EST NON AFRICAN AMERICAN: 83 mL/min — AB (ref >90)
Glucose, Bld: 102 mg/dL — ABNORMAL HIGH (ref 70–99)
Potassium: 3.8 mmol/L (ref 3.5–5.1)
Sodium: 138 mmol/L (ref 135–145)
Total Bilirubin: 0.4 mg/dL (ref 0.3–1.2)
Total Protein: 7 g/dL (ref 6.0–8.3)

## 2014-05-12 LAB — CBC
HCT: 38.9 % (ref 36.0–46.0)
Hemoglobin: 13.9 g/dL (ref 12.0–15.0)
MCH: 28.2 pg (ref 26.0–34.0)
MCHC: 35.7 g/dL (ref 30.0–36.0)
MCV: 78.9 fL (ref 78.0–100.0)
Platelets: 276 10*3/uL (ref 150–400)
RBC: 4.93 MIL/uL (ref 3.87–5.11)
RDW: 13 % (ref 11.5–15.5)
WBC: 9.2 10*3/uL (ref 4.0–10.5)

## 2014-05-12 LAB — I-STAT TROPONIN, ED: TROPONIN I, POC: 0 ng/mL (ref 0.00–0.08)

## 2014-05-12 LAB — BRAIN NATRIURETIC PEPTIDE: B Natriuretic Peptide: 8.5 pg/mL (ref 0.0–100.0)

## 2014-05-12 LAB — D-DIMER, QUANTITATIVE (NOT AT ARMC): D DIMER QUANT: 0.66 ug{FEU}/mL — AB (ref 0.00–0.48)

## 2014-05-12 MED ORDER — NITROGLYCERIN 0.4 MG SL SUBL
0.4000 mg | SUBLINGUAL_TABLET | SUBLINGUAL | Status: DC | PRN
  Administered 2014-05-12: 0.4 mg via SUBLINGUAL
  Filled 2014-05-12: qty 1

## 2014-05-12 MED ORDER — IOHEXOL 350 MG/ML SOLN
80.0000 mL | Freq: Once | INTRAVENOUS | Status: AC | PRN
  Administered 2014-05-12: 80 mL via INTRAVENOUS

## 2014-05-12 MED ORDER — HYDROCODONE-ACETAMINOPHEN 5-325 MG PO TABS
1.0000 | ORAL_TABLET | Freq: Once | ORAL | Status: AC
Start: 2014-05-12 — End: 2014-05-12
  Administered 2014-05-12: 1 via ORAL
  Filled 2014-05-12: qty 1

## 2014-05-12 MED ORDER — HYDROCODONE-ACETAMINOPHEN 5-325 MG PO TABS: 1.0000 | ORAL_TABLET | ORAL | Status: DC | PRN

## 2014-05-12 MED ORDER — HYDROCODONE-ACETAMINOPHEN 5-325 MG PO TABS: 1.0000 | ORAL_TABLET | Freq: Once | ORAL | Status: DC

## 2014-05-12 NOTE — Discharge Instructions (Signed)

## 2014-05-12 NOTE — ED Notes (Signed)
MD at bedside. 

## 2014-05-12 NOTE — ED Notes (Signed)
Pt reports SOB that started two hours ago, pt reports chest pain as well. Pt is A&O X4.

## 2014-05-12 NOTE — ED Provider Notes (Signed)
CSN: [id number]     Arrival date & time 05/12/14  0223 History  This chart was scribed for Loren Racer, MD by Annye Asa, ED Scribe. This patient was seen in room D33C/D33C and the patient's care was started at 2:46 AM.     Chief Complaint  Patient presents with  . Shortness of Breath   Patient is a 24 y.o. female presenting with shortness of breath. The history is provided by the patient. No language interpreter was used.  Shortness of Breath Associated symptoms: chest pain   Associated symptoms: no abdominal pain, no cough, no fever, no headaches, no neck pain, no rash and no vomiting      HPI Comments: Aleaha A Rolena Infante is a 24 y.o. female who presents to the Emergency Department complaining of 2 hours of waxing and waning central chest pains, beginning at rest tonight. She describes her pain as "sharp" and "pressure," worse with applied pressure and the release of applied pressure. She also notes associated mild SOB. She denies abdominal pain. She denies prior experience with similar symptoms or recent extended period of immobility.   Patient has a history of gastric ulcers; her symptoms at this time do not remind her of previous ulcers.  Past Medical History  Diagnosis Date  . Hypoglycemia   . Ovarian cyst 2011  . Constipation   . Sickle cell trait   . History of dysmenorrhea   . Gastric ulcer    Past Surgical History  Procedure Laterality Date  . Laparoscopic ovarian cystectomy  2011  . Eye surgery      2004  . Laparotomy N/A 06/16/2012    Procedure: EXPLORATORY LAPAROTOMY;  Surgeon: Emelia Loron, MD;  Location: [facility] OR;  Service: General;  Laterality: N/A;  Repair of duodenal ulcer  . Repair of perforated ulcer N/A 06/16/2012    Procedure: REPAIR OF PERFORATED ULCER;  Surgeon: Emelia Loron, MD;  Location: [facility] OR;  Service: General;  Laterality: N/A;  duodenal   Family History  Problem Relation Age of Onset  . Anesthesia problems Neg Hx   . Hearing loss Neg Hx    . Breast cancer Paternal Grandmother   . Brain cancer     History  Substance Use Topics  . Smoking status: Never Smoker   . Smokeless tobacco: Never Used  . Alcohol Use: No   OB History    Gravida Para Term Preterm AB TAB SAB Ectopic Multiple Living   0 0 0 0 0 0 1     Review of Systems  Constitutional: Negative for fever and chills.  Respiratory: Positive for shortness of breath. Negative for cough.   Cardiovascular: Positive for chest pain. Negative for palpitations and leg swelling.  Gastrointestinal: Negative for nausea, vomiting, abdominal pain, diarrhea and constipation.  Musculoskeletal: Negative for back pain, neck pain and neck stiffness.  Skin: Negative for rash and wound.  Neurological: Negative for dizziness, weakness, light-headedness, numbness and headaches.  All other systems reviewed and are negative.   Allergies  Nsaids  Home Medications   Prior to Admission medications   Medication Sig Start Date End Date Taking? Authorizing Provider  acetaminophen (TYLENOL) 325 MG tablet Take 650 mg by mouth every 6 (six) hours as needed for moderate pain.   Yes Historical Provider, MD  acyclovir (ZOVIRAX) 400 MG tablet Take 1 tablet (400 mg total) by mouth 3 (three) times daily. Patient not taking: Reported on 05/12/2014 02/08/14   Roxy Horseman, PA-C  ciprofloxacin (CIPRO) 250 MG  tablet Take 1 tablet (250 mg total) by mouth every 12 (twelve) hours. Patient not taking: Reported on 05/12/2014 12/11/13   Marny LowensteinJulie N Wenzel, PA-C  HYDROcodone-acetaminophen (NORCO/VICODIN) 5-325 MG per tablet Take 1 tablet by mouth once. 05/12/14   Loren Raceravid Zamar Odwyer, MD  medroxyPROGESTERone (DEPO-PROVERA) 150 MG/ML injection Inject 150 mg into the muscle every 3 (three) months.    Historical Provider, MD  omeprazole (PRILOSEC) 20 MG capsule Take 1 capsule (20 mg total) by mouth daily. Patient not taking: Reported on 05/12/2014 10/26/13   Clement SayresStaci Shepard, MD  ondansetron (ZOFRAN) 4 MG tablet Take 1  tablet (4 mg total) by mouth every 6 (six) hours. Patient not taking: Reported on 05/12/2014 12/11/13   Marny LowensteinJulie N Wenzel, PA-C  phenazopyridine (PYRIDIUM) 200 MG tablet Take 1 tablet (200 mg total) by mouth 3 (three) times daily. Patient not taking: Reported on 05/12/2014 12/11/13   Marny LowensteinJulie N Wenzel, PA-C  promethazine (PHENERGAN) 25 MG tablet Take 1 tablet (25 mg total) by mouth every 6 (six) hours as needed for nausea or vomiting. Patient not taking: Reported on 05/12/2014 10/26/13   Clement SayresStaci Shepard, MD   BP 108/58 mmHg  Pulse 88  Temp(Src) 98.5 F (36.9 C) (Oral)  Resp 15  Ht 5\' 3"  (1.6 m)  Wt 150 lb (68.04 kg)  BMI 26.58 kg/m2  SpO2 96% Physical Exam  Constitutional: She is oriented to person, place, and time. She appears well-developed and well-nourished. No distress.  Anxious appearing  HENT:  Head: Normocephalic and atraumatic.  Mouth/Throat: Oropharynx is clear and moist.  Eyes: EOM are normal. Pupils are equal, round, and reactive to light.  Neck: Normal range of motion. Neck supple.  Cardiovascular: Normal rate and regular rhythm.   Pulmonary/Chest: Effort normal and breath sounds normal. No respiratory distress. She has no wheezes. She has no rales. She exhibits tenderness (right parasternal tenderness with palpation.).  Abdominal: Soft. Bowel sounds are normal. She exhibits no distension and no mass. There is no tenderness. There is no rebound and no guarding.  Musculoskeletal: Normal range of motion. She exhibits no edema or tenderness.  No lower extremity swelling or tenderness.  Neurological: She is alert and oriented to person, place, and time.  Skin: Skin is warm and dry. No rash noted. No erythema.  Nursing note and vitals reviewed.   ED Course  Procedures   DIAGNOSTIC STUDIES: Oxygen Saturation is 99% on RA, normal by my interpretation.    COORDINATION OF CARE: 2:50 AM Discussed treatment plan with pt at bedside and pt agreed to plan.   Labs Review Labs Reviewed   COMPREHENSIVE METABOLIC PANEL - Abnormal; Notable for the following:    Glucose, Bld 102 (*)    GFR calc non Af Amer 83 (*)    All other components within normal limits  D-DIMER, QUANTITATIVE - Abnormal; Notable for the following:    D-Dimer, Quant 0.66 (*)    All other components within normal limits  CBC  BRAIN NATRIURETIC PEPTIDE  I-STAT TROPOININ, ED    Imaging Review Dg Chest 2 View  05/12/2014   CLINICAL DATA:  Intermittent central chest pain and mild shortness of breath, acute onset. Initial encounter.  EXAM: CHEST  2 VIEW  COMPARISON:  Chest radiograph performed 10/22/2012  FINDINGS: The lungs are well-aerated and clear. There is no evidence of focal opacification, pleural effusion or pneumothorax.  The heart is normal in size; the mediastinal contour is within normal limits. No acute osseous abnormalities are seen.  IMPRESSION: No acute cardiopulmonary  process seen.   Electronically Signed   By: Roanna Raider M.D.   On: 05/12/2014 03:34   Ct Angio Chest Pe W/cm &/or Wo Cm  05/12/2014   CLINICAL DATA:  Acute onset of generalized chest pain. Elevated D-dimer. Initial encounter.  EXAM: CT ANGIOGRAPHY CHEST WITH CONTRAST  TECHNIQUE: Multidetector CT imaging of the chest was performed using the standard protocol during bolus administration of intravenous contrast. Multiplanar CT image reconstructions and MIPs were obtained to evaluate the vascular anatomy.  CONTRAST:  80mL OMNIPAQUE IOHEXOL 350 MG/ML SOLN  COMPARISON:  Chest radiograph performed earlier today at 3:07 a.m.  FINDINGS: There is no evidence of pulmonary embolus.  The lungs are essentially clear bilaterally. There is no evidence of significant focal consolidation, pleural effusion or pneumothorax. No masses are identified; no abnormal focal contrast enhancement is seen.  The mediastinum is grossly unremarkable in appearance. No mediastinal lymphadenopathy is seen. The great vessels are grossly unremarkable. No pericardial effusion  is identified. No axillary lymphadenopathy is seen. The visualized portions of the thyroid gland are unremarkable in appearance.  The visualized portions of the liver and spleen are unremarkable. The visualized portions of the pancreas, stomach, adrenal glands and left kidney are within normal limits. A 1.6 cm cyst is partially imaged at the anterior aspect of the right kidney.  No acute osseous abnormalities are seen.  Review of the MIP images confirms the above findings.  IMPRESSION: 1. No evidence of pulmonary embolus. 2. Lungs clear bilaterally. 3. Small right renal cyst noted.   Electronically Signed   By: Roanna Raider M.D.   On: 05/12/2014 06:48     EKG Interpretation   Date/Time:  Sunday May 12 2014 02:33:50 EST Ventricular Rate:  112 PR Interval:  128 QRS Duration: 75 QT Interval:  322 QTC Calculation: 439 R Axis:   72 Text Interpretation:  Sinus tachycardia Probable left atrial enlargement  Borderline repolarization abnormality Baseline wander in lead(s) V3  Confirmed by Ranae Palms  MD, Alwaleed Obeso ([id number]) on 05/12/2014 2:58:54 AM      MDM   Final diagnoses:  Positive D dimer  Atypical chest pain    I personally performed the services described in this documentation, which was scribed in my presence. The recorded information has been reviewed and considered.   She is resting comfortably. Vital signs remained stable throughout. Negative cardiac workup. Mildly elevated d-dimer. CT imaging of the chest without any evidence of PE. The patient's symptoms. Atypical for coronary artery disease. Have very low suspicion. Chest pain seems to be reproducible with palpation. Suspect versus costochondritis versus muscle strain. She is advised to follow-up with her primary doctor. She's been given return precautions and is voiced understanding.  Loren Racer, MD 05/12/14 [id number]

## 2014-05-12 NOTE — ED Notes (Addendum)
RN notified EDP of patient's headache and chest pain that did not improve following NTG.

## 2014-05-12 NOTE — ED Notes (Signed)
Patient transported to CT 

## 2014-05-12 NOTE — ED Notes (Signed)
Pt updated on plan of care

## 2014-09-08 ENCOUNTER — Emergency Department (HOSPITAL_COMMUNITY): Payer: Medicaid Other

## 2014-09-08 ENCOUNTER — Encounter (HOSPITAL_COMMUNITY): Payer: Self-pay | Admitting: Adult Health

## 2014-09-08 ENCOUNTER — Emergency Department (HOSPITAL_COMMUNITY)
Admission: EM | Admit: 2014-09-08 | Discharge: 2014-09-08 | Disposition: A | Discharge status: Home / Self Care | Payer: Medicaid Other | Attending: Emergency Medicine | Admitting: Emergency Medicine

## 2014-09-08 DIAGNOSIS — Z3202 Encounter for pregnancy test, result negative: Secondary | ICD-10-CM | POA: Diagnosis not present

## 2014-09-08 DIAGNOSIS — Z862 Personal history of diseases of the blood and blood-forming organs and certain disorders involving the immune mechanism: Secondary | ICD-10-CM | POA: Diagnosis not present

## 2014-09-08 DIAGNOSIS — R000 Tachycardia, unspecified: Secondary | ICD-10-CM | POA: Diagnosis not present

## 2014-09-08 DIAGNOSIS — Z8742 Personal history of other diseases of the female genital tract: Secondary | ICD-10-CM | POA: Diagnosis not present

## 2014-09-08 DIAGNOSIS — R7989 Other specified abnormal findings of blood chemistry: Secondary | ICD-10-CM | POA: Insufficient documentation

## 2014-09-08 DIAGNOSIS — Z8639 Personal history of other endocrine, nutritional and metabolic disease: Secondary | ICD-10-CM | POA: Diagnosis not present

## 2014-09-08 DIAGNOSIS — M25561 Pain in right knee: Secondary | ICD-10-CM | POA: Diagnosis present

## 2014-09-08 DIAGNOSIS — R0602 Shortness of breath: Secondary | ICD-10-CM | POA: Diagnosis not present

## 2014-09-08 DIAGNOSIS — Z8719 Personal history of other diseases of the digestive system: Secondary | ICD-10-CM | POA: Insufficient documentation

## 2014-09-08 LAB — CBC WITH DIFFERENTIAL/PLATELET
BASOS PCT: 0 % (ref 0–1)
Basophils Absolute: 0 10*3/uL (ref 0.0–0.1)
Eosinophils Absolute: 0 10*3/uL (ref 0.0–0.7)
Eosinophils Relative: 0 % (ref 0–5)
HEMATOCRIT: 40.7 % (ref 36.0–46.0)
HEMOGLOBIN: 14 g/dL (ref 12.0–15.0)
Lymphocytes Relative: 20 % (ref 12–46)
Lymphs Abs: 1.9 10*3/uL (ref 0.7–4.0)
MCH: 27.2 pg (ref 26.0–34.0)
MCHC: 34.4 g/dL (ref 30.0–36.0)
MCV: 79 fL (ref 78.0–100.0)
MONOS PCT: 6 % (ref 3–12)
Monocytes Absolute: 0.6 10*3/uL (ref 0.1–1.0)
Neutro Abs: 7.2 10*3/uL (ref 1.7–7.7)
Neutrophils Relative %: 74 % (ref 43–77)
Platelets: 279 10*3/uL (ref 150–400)
RBC: 5.15 MIL/uL — ABNORMAL HIGH (ref 3.87–5.11)
RDW: 13.3 % (ref 11.5–15.5)
WBC: 9.7 10*3/uL (ref 4.0–10.5)

## 2014-09-08 LAB — BASIC METABOLIC PANEL
ANION GAP: 10 (ref 5–15)
BUN: 6 mg/dL (ref 6–20)
CO2: 23 mmol/L (ref 22–32)
Calcium: 9.6 mg/dL (ref 8.9–10.3)
Chloride: 104 mmol/L (ref 101–111)
Creatinine, Ser: 0.91 mg/dL (ref 0.44–1.00)
GLUCOSE: 116 mg/dL — AB (ref 65–99)
POTASSIUM: 3.4 mmol/L — AB (ref 3.5–5.1)
SODIUM: 137 mmol/L (ref 135–145)

## 2014-09-08 LAB — D-DIMER, QUANTITATIVE (NOT AT ARMC): D DIMER QUANT: 0.77 ug{FEU}/mL — AB (ref 0.00–0.48)

## 2014-09-08 LAB — POC URINE PREG, ED: PREG TEST UR: NEGATIVE

## 2014-09-08 MED ORDER — OXYCODONE-ACETAMINOPHEN 5-325 MG PO TABS: 1.0000 | ORAL_TABLET | ORAL | Status: DC | PRN

## 2014-09-08 MED ORDER — IOHEXOL 350 MG/ML SOLN
100.0000 mL | Freq: Once | INTRAVENOUS | Status: AC | PRN
  Administered 2014-09-08: 100 mL via INTRAVENOUS

## 2014-09-08 NOTE — ED Notes (Signed)
Presents with right leg pain on inner kinee area began today and is very painful-worse with standing-denies injury. Endorses SOB-she is tachycardic HR 115-120. SOB began today. Denies recent travel denies smoking history-

## 2014-09-08 NOTE — Discharge Instructions (Signed)
Use crutches, immobilizer as needed.   Arthralgia Your caregiver has diagnosed you as suffering from an arthralgia. Arthralgia means there is pain in a joint. This can come from many reasons including:  Bruising the joint which causes soreness (inflammation) in the joint.  Wear and tear on the joints which occur as we grow older (osteoarthritis).  Overusing the joint.  Various forms of arthritis.  Infections of the joint. Regardless of the cause of pain in your joint, most of these different pains respond to anti-inflammatory drugs and rest. The exception to this is when a joint is infected, and these cases are treated with antibiotics, if it is a bacterial infection. HOME CARE INSTRUCTIONS   Rest the injured area for as long as directed by your caregiver. Then slowly start using the joint as directed by your caregiver and as the pain allows. Crutches as directed may be useful if the ankles, knees or hips are involved. If the knee was splinted or casted, continue use and care as directed. If an stretchy or elastic wrapping bandage has been applied today, it should be removed and re-applied every 3 to 4 hours. It should not be applied tightly, but firmly enough to keep swelling down. Watch toes and feet for swelling, bluish discoloration, coldness, numbness or excessive pain. If any of these problems (symptoms) occur, remove the ace bandage and re-apply more loosely. If these symptoms persist, contact your caregiver or return to this location.  For the first 24 hours, keep the injured extremity elevated on pillows while lying down.  Apply ice for 15-20 minutes to the sore joint every couple hours while awake for the first half day. Then 03-04 times per day for the first 48 hours. Put the ice in a plastic bag and place a towel between the bag of ice and your skin.  Wear any splinting, casting, elastic bandage applications, or slings as instructed.  Only take over-the-counter or prescription  medicines for pain, discomfort, or fever as directed by your caregiver. Do not use aspirin immediately after the injury unless instructed by your physician. Aspirin can cause increased bleeding and bruising of the tissues.  If you were given crutches, continue to use them as instructed and do not resume weight bearing on the sore joint until instructed. Persistent pain and inability to use the sore joint as directed for more than 2 to 3 days are warning signs indicating that you should see a caregiver for a follow-up visit as soon as possible. Initially, a hairline fracture (break in bone) may not be evident on X-rays. Persistent pain and swelling indicate that further evaluation, non-weight bearing or use of the joint (use of crutches or slings as instructed), or further X-rays are indicated. X-rays may sometimes not show a small fracture until a week or 10 days later. Make a follow-up appointment with your own caregiver or one to whom we have referred you. A radiologist (specialist in reading X-rays) may read your X-rays. Make sure you know how you are to obtain your X-ray results. Do not assume everything is normal if you do not hear from us. SEEK MEDICAL CARE IF: Bruising, swelling, or pain increases. SEEK IMMEDIATE MEDICAL CARE IF:   Your fingers or toes are numb or blue.  The pain is not responding to medications and continues to stay the same or get worse.  The pain in your joint becomes severe.  You develop a fever over 102 F (38.9 C).  It becomes impossible to move or  use the joint. MAKE SURE YOU:   Understand these instructions.  Will watch your condition.  Will get help right away if you are not doing well or get worse. Document Released: 04/12/2005 Document Revised: 07/05/2011 Document Reviewed: 11/29/2007 [facility] Patient Information 2015 [place], [place]. This information is not intended to replace advice given to you by your health care provider. Make sure you discuss any  questions you have with your health care provider.  Acetaminophen; Oxycodone tablets What is this medicine? ACETAMINOPHEN; OXYCODONE (a set a MEE noe fen; ox i KOE done) is a pain reliever. It is used to treat mild to moderate pain. This medicine may be used for other purposes; ask your health care provider or pharmacist if you have questions. COMMON BRAND NAME(S): Endocet, Magnacet, Narvox, Percocet, Perloxx, Primalev, Primlev, Roxicet, Xolox What should I tell my health care provider before I take this medicine? They need to know if you have any of these conditions: -brain tumor -Crohn's disease, inflammatory bowel disease, or ulcerative colitis -drug abuse or addiction -head injury -heart or circulation problems -if you often drink alcohol -kidney disease or problems going to the bathroom -liver disease -lung disease, asthma, or breathing problems -an unusual or allergic reaction to acetaminophen, oxycodone, other opioid analgesics, other medicines, foods, dyes, or preservatives -pregnant or trying to get pregnant -breast-feeding How should I use this medicine? Take this medicine by mouth with a full glass of water. Follow the directions on the prescription label. Take your medicine at regular intervals. Do not take your medicine more often than directed. Talk to your pediatrician regarding the use of this medicine in children. Special care may be needed. Patients over 236 years old may have a stronger reaction and need a smaller dose. Overdosage: If you think you have taken too much of this medicine contact a poison control center or emergency room at once. NOTE: This medicine is only for you. Do not share this medicine with others. What if I miss a dose? If you miss a dose, take it as soon as you can. If it is almost time for your next dose, take only that dose. Do not take double or extra doses. What may interact with this medicine? -alcohol -antihistamines -barbiturates like  amobarbital, butalbital, butabarbital, methohexital, pentobarbital, phenobarbital, thiopental, and secobarbital -benztropine -drugs for bladder problems like solifenacin, trospium, oxybutynin, tolterodine, hyoscyamine, and methscopolamine -drugs for breathing problems like ipratropium and tiotropium -drugs for certain stomach or intestine problems like propantheline, homatropine methylbromide, glycopyrrolate, atropine, belladonna, and dicyclomine -general anesthetics like etomidate, ketamine, nitrous oxide, propofol, desflurane, enflurane, halothane, isoflurane, and sevoflurane -medicines for depression, anxiety, or psychotic disturbances -medicines for sleep -muscle relaxants -naltrexone -narcotic medicines (opiates) for pain -phenothiazines like perphenazine, thioridazine, chlorpromazine, mesoridazine, fluphenazine, prochlorperazine, promazine, and trifluoperazine -scopolamine -tramadol -trihexyphenidyl This list may not describe all possible interactions. Give your health care provider a list of all the medicines, herbs, non-prescription drugs, or dietary supplements you use. Also tell them if you smoke, drink alcohol, or use illegal drugs. Some items may interact with your medicine. What should I watch for while using this medicine? Tell your doctor or health care professional if your pain does not go away, if it gets worse, or if you have new or a different type of pain. You may develop tolerance to the medicine. Tolerance means that you will need a higher dose of the medication for pain relief. Tolerance is normal and is expected if you take this medicine for a long time. Do not  suddenly stop taking your medicine because you may develop a severe reaction. Your body becomes used to the medicine. This does NOT mean you are addicted. Addiction is a behavior related to getting and using a drug for a non-medical reason. If you have pain, you have a medical reason to take pain medicine. Your doctor  will tell you how much medicine to take. If your doctor wants you to stop the medicine, the dose will be slowly lowered over time to avoid any side effects. You may get drowsy or dizzy. Do not drive, use machinery, or do anything that needs mental alertness until you know how this medicine affects you. Do not stand or sit up quickly, especially if you are an older patient. This reduces the risk of dizzy or fainting spells. Alcohol may interfere with the effect of this medicine. Avoid alcoholic drinks. There are different types of narcotic medicines (opiates) for pain. If you take more than one type at the same time, you may have more side effects. Give your health care provider a list of all medicines you use. Your doctor will tell you how much medicine to take. Do not take more medicine than directed. Call emergency for help if you have problems breathing. The medicine will cause constipation. Try to have a bowel movement at least every 2 to 3 days. If you do not have a bowel movement for 3 days, call your doctor or health care professional. Do not take Tylenol (acetaminophen) or medicines that have acetaminophen with this medicine. Too much acetaminophen can be very dangerous. Many nonprescription medicines contain acetaminophen. Always read the labels carefully to avoid taking more acetaminophen. What side effects may I notice from receiving this medicine? Side effects that you should report to your doctor or health care professional as soon as possible: -allergic reactions like skin rash, itching or hives, swelling of the face, lips, or tongue -breathing difficulties, wheezing -confusion -light headedness or fainting spells -severe stomach pain -unusually weak or tired -yellowing of the skin or the whites of the eyes Side effects that usually do not require medical attention (report to your doctor or health care professional if they continue or are  bothersome): -dizziness -drowsiness -nausea -vomiting This list may not describe all possible side effects. Call your doctor for medical advice about side effects. You may report side effects to FDA at 1-800-FDA-1088. Where should I keep my medicine? Keep out of the reach of children. This medicine can be abused. Keep your medicine in a safe place to protect it from theft. Do not share this medicine with anyone. Selling or giving away this medicine is dangerous and against the law. Store at room temperature between 20 and 25 degrees C (68 and 77 degrees F). Keep container tightly closed. Protect from light. This medicine may cause accidental overdose and death if it is taken by other adults, children, or pets. Flush any unused medicine down the toilet to reduce the chance of harm. Do not use the medicine after the expiration date. NOTE: This sheet is a summary. It may not cover all possible information. If you have questions about this medicine, talk to your doctor, pharmacist, or health care provider.  2015, [employer]. (2012-12-04 13:17:35)

## 2014-09-08 NOTE — ED Provider Notes (Signed)
CSN: [id number]     Arrival date & time 09/08/14  0058 History  This chart was scribed for Dione Boozeavid Mcadoo Muzquiz, MD by Phillis HaggisGabriella Gaje, ED Scribe. This patient was seen in room B14C/B14C and patient care was started at 2:25 AM.   Chief Complaint  Patient presents with  . Leg Pain  . Shortness of Breath   The history is provided by the patient. No language interpreter was used.    HPI Comments: [name] is a 24 y.o. female who presents to the Emergency Department complaining of right leg pain onset 14 hours ago. She reports that the pain is in the medial aspect of her right knee. She reports the pain 10/10 with standing, but not as severe with sitting down. She reports that she has always experienced tenderness and sensitivity to touch in her legs. She denies taking anything for the pain. She denies any recent injury, such as twisting the knee. She reports SOB onset 5 hours ago. She denies chest discomfort.  She denies history of smoking, recent long distance travel, history of blood clots, family history of blood clots, or use of BC. She states that Dr. Paulina FusiHess is her PCP. She states that she does not go to the family practice office often.   Past Medical History  Diagnosis Date  . Hypoglycemia   . Ovarian cyst 2011  . Constipation   . Sickle cell trait   . History of dysmenorrhea   . Gastric ulcer    Past Surgical History  Procedure Laterality Date  . Laparoscopic ovarian cystectomy  2011  . Eye surgery      2004  . Laparotomy N/A 06/16/2012    Procedure: EXPLORATORY LAPAROTOMY;  Surgeon: Emelia LoronMatthew Wakefield, MD;  [facility];  Service: General;  Laterality: N/A;  Repair of duodenal ulcer  . Repair of perforated ulcer N/A 06/16/2012    Procedure: REPAIR OF PERFORATED ULCER;  Surgeon: Emelia LoronMatthew Wakefield, MD;  [facility];  Service: General;  Laterality: N/A;  duodenal   Family History  Problem Relation Age of Onset  . Anesthesia problems Neg Hx   . Hearing loss Neg Hx   . Breast  cancer Paternal Grandmother   . Sherry cancer     History  Substance Use Topics  . Smoking status: Never Smoker   . Smokeless tobacco: Never Used  . Alcohol Use: No   OB History    Gravida Para Term Preterm AB TAB SAB Ectopic Multiple Living   1 1 1  0 0 0 0 0 0 1     Review of Systems  Respiratory: Positive for shortness of breath.   Musculoskeletal: Positive for arthralgias.  All other systems reviewed and are negative.  Allergies  Nsaids  Home Medications   Prior to Admission medications   Medication Sig Start Date End Date Taking? Authorizing Provider  acetaminophen (TYLENOL) 325 MG tablet Take 650 mg by mouth every 6 (six) hours as needed for moderate pain.    Historical Provider, MD  acyclovir (ZOVIRAX) 400 MG tablet Take 1 tablet (400 mg total) by mouth 3 (three) times daily. Patient not taking: Reported on 05/12/2014 02/08/14   Roxy Horsemanobert Browning, PA-C  ciprofloxacin (CIPRO) 250 MG tablet Take 1 tablet (250 mg total) by mouth every 12 (twelve) hours. Patient not taking: Reported on 05/12/2014 12/11/13   Marny LowensteinJulie N Wenzel, PA-C  HYDROcodone-acetaminophen (NORCO/VICODIN) 5-325 MG per tablet Take 1 tablet by mouth every 4 (four) hours as needed for moderate pain. 05/12/14  Loren Racer, MD  medroxyPROGESTERone (DEPO-PROVERA) 150 MG/ML injection Inject 150 mg into the muscle every 3 (three) months.    Historical Provider, MD  omeprazole (PRILOSEC) 20 MG capsule Take 1 capsule (20 mg total) by mouth daily. Patient not taking: Reported on 05/12/2014 10/26/13   Clement Sayres, MD  ondansetron (ZOFRAN) 4 MG tablet Take 1 tablet (4 mg total) by mouth every 6 (six) hours. Patient not taking: Reported on 05/12/2014 12/11/13   Marny Lowenstein, PA-C  phenazopyridine (PYRIDIUM) 200 MG tablet Take 1 tablet (200 mg total) by mouth 3 (three) times daily. Patient not taking: Reported on 05/12/2014 12/11/13   Marny Lowenstein, PA-C  promethazine (PHENERGAN) 25 MG tablet Take 1 tablet (25 mg total) by mouth  every 6 (six) hours as needed for nausea or vomiting. Patient not taking: Reported on 05/12/2014 10/26/13   Clement Sayres, MD   BP 133/88 mmHg  Pulse 108  Temp(Src) 98.9 F (37.2 C) (Oral)  Resp 16  SpO2 96%  LMP 05/10/2012 (Approximate)   Physical Exam  Constitutional: She is oriented to person, place, and time. She appears well-developed and well-nourished.  HENT:  Head: Normocephalic and atraumatic.  Eyes: EOM are normal. Pupils are equal, round, and reactive to light.  Neck: Normal range of motion. Neck supple. No JVD present.  Cardiovascular: Normal rate, regular rhythm and normal heart sounds.   No murmur heard. Pulmonary/Chest: Effort normal and breath sounds normal. She has no wheezes. She has no rales. She exhibits no tenderness.  Abdominal: Soft. Bowel sounds are normal. She exhibits no distension and no mass. There is no tenderness.  Musculoskeletal: Normal range of motion. She exhibits no edema.  No swelling or tenderness of the right knee, no effusion, no instability, calf circumference is equal  Lymphadenopathy:    She has no cervical adenopathy.  Neurological: She is alert and oriented to person, place, and time. No cranial nerve deficit. She exhibits normal muscle tone. Coordination normal.  Skin: Skin is warm and dry. No rash noted.  Psychiatric: She has a normal mood and affect. Her behavior is normal. Judgment and thought content normal.  Nursing note and vitals reviewed.   ED Course  Procedures (including critical care time) DIAGNOSTIC STUDIES: Oxygen Saturation is 96% on room air, normal by my interpretation.    COORDINATION OF CARE: 2:29 AM-Discussed treatment plan which includes D-Dimer labs and referral to orthopedic doctor if necessary with pt at bedside and pt agreed to plan.   Labs Review Results for orders placed or performed during the hospital encounter of 09/08/14  CBC with Differential  Result Value Ref Range   WBC 9.7 4.0 - 10.5 K/uL   RBC  5.15 (H) 3.87 - 5.11 MIL/uL   Hemoglobin 14.0 12.0 - 15.0 g/dL   HCT 16.1 09.6 - 04.5 %   MCV 79.0 78.0 - 100.0 fL   MCH 27.2 26.0 - 34.0 pg   MCHC 34.4 30.0 - 36.0 g/dL   RDW 40.9 81.1 - 91.4 %   Platelets 279 150 - 400 K/uL   Neutrophils Relative % 74 43 - 77 %   Neutro Abs 7.2 1.7 - 7.7 K/uL   Lymphocytes Relative 20 12 - 46 %   Lymphs Abs 1.9 0.7 - 4.0 K/uL   Monocytes Relative 6 3 - 12 %   Monocytes Absolute 0.6 0.1 - 1.0 K/uL   Eosinophils Relative 0 0 - 5 %   Eosinophils Absolute 0.0 0.0 - 0.7 K/uL   Basophils  Relative 0 0 - 1 %   Basophils Absolute 0.0 0.0 - 0.1 K/uL  Basic metabolic panel  Result Value Ref Range   Sodium 137 135 - 145 mmol/L   Potassium 3.4 (L) 3.5 - 5.1 mmol/L   Chloride 104 101 - 111 mmol/L   CO2 23 22 - 32 mmol/L   Glucose, Bld 116 (H) 65 - 99 mg/dL   BUN 6 6 - 20 mg/dL   Creatinine, Ser 1.61 0.44 - 1.00 mg/dL   Calcium 9.6 8.9 - 09.6 mg/dL   GFR calc non Af Amer >60 >60 mL/min   GFR calc Af Amer >60 >60 mL/min   Anion gap 10 5 - 15  D-dimer, quantitative  Result Value Ref Range   D-Dimer, Quant 0.77 (H) 0.00 - 0.48 ug/mL-FEU  POC urine preg, ED (not at [facility])  Result Value Ref Range   Preg Test, Ur NEGATIVE NEGATIVE   Imaging Review Dg Chest 2 View  09/08/2014   CLINICAL DATA:  Right medial knee pain. No injury. Shortness of breath.  EXAM: CHEST  2 VIEW  COMPARISON:  05/12/2014  FINDINGS: The heart size and mediastinal contours are within normal limits. Both lungs are clear. The visualized skeletal structures are unremarkable.  IMPRESSION: No active cardiopulmonary disease.   Electronically Signed   By: Burman Nieves M.D.   On: 09/08/2014 02:15   Ct Angio Chest Pe W/cm &/or Wo Cm  09/08/2014   CLINICAL DATA:  Shortness of breath. Right leg pain worse with standing.  EXAM: CT ANGIOGRAPHY CHEST WITH CONTRAST  TECHNIQUE: Multidetector CT imaging of the chest was performed using the standard protocol during bolus administration of intravenous  contrast. Multiplanar CT image reconstructions and MIPs were obtained to evaluate the vascular anatomy.  CONTRAST:  OMNIPAQUE IOHEXOL 350 MG/ML SOLN  COMPARISON:  05/12/2014  FINDINGS: Technically adequate study with good opacification of the central and segmental pulmonary arteries. No focal filling defects demonstrated. No evidence of significant pulmonary embolus.  Normal heart size. Normal caliber thoracic aorta. No aortic dissection. Great vessel origins are patent. Increased density in the anterior mediastinum likely represents residual thymus. Esophagus is decompressed. No significant lymphadenopathy in the chest.  Motion artifact limits evaluation of the lungs. No focal airspace disease or consolidation is suggested. No interstitial infiltration. No pleural effusions. No pneumothorax.  Included portions of the upper abdominal organs are grossly unremarkable. No destructive bone lesions.  Review of the MIP images confirms the above findings.  IMPRESSION: No evidence of significant pulmonary embolus. No evidence of active pulmonary disease.   Electronically Signed   By: Burman Nieves M.D.   On: 09/08/2014 04:23     EKG Interpretation   Date/Time:  Sunday Sep 08 2014 01:07:53 EDT Ventricular Rate:  119 PR Interval:  118 QRS Duration: 70 QT Interval:  308 QTC Calculation: 433 R Axis:   85 Text Interpretation:  Sinus tachycardia Right atrial enlargement T wave  abnormality, consider inferior ischemia T wave abnormality, consider  anterior ischemia Abnormal ECG When compared with ECG of 05/12/2014, No  significant change was found Confirmed by [facility]  MD, Doneen Ollinger ([id number]) on  09/08/2014 1:10:21 AM      MDM   Final diagnoses:  Pain in right knee    Right knee pain of uncertain cause. No physical findings to suggest musculoskeletal pain or DVT. However, she is noted to be tachycardic and tachypnea so d-dimer is obtained which has come back elevated. She is sent for CT angiogram which  is  negative for pulmonary embolism. Old records reviewed in she also had an elevated d-dimer with negative CT angiogram in January of this year. It is not clear what is causing her knee pain. She is referred to orthopedics for follow-up and is discharged with prescription for oxycodone have acetaminophen for pain.  I personally performed the services described in this documentation, which was scribed in my presence. The recorded information has been reviewed and is accurate.     Dione Boozeavid Steffan Caniglia, MD 09/08/14 [phone]

## 2014-09-08 NOTE — ED Notes (Signed)
Patient transported to CT 

## 2014-10-25 ENCOUNTER — Encounter (HOSPITAL_COMMUNITY): Payer: Self-pay | Admitting: *Deleted

## 2014-10-25 ENCOUNTER — Inpatient Hospital Stay (HOSPITAL_COMMUNITY)
Admission: AD | Admit: 2014-10-25 | Discharge: 2014-10-26 | Disposition: A | Discharge status: Home / Self Care | Payer: Medicaid Other | Source: Ambulatory Visit | Attending: Obstetrics | Admitting: Obstetrics

## 2014-10-25 DIAGNOSIS — R109 Unspecified abdominal pain: Secondary | ICD-10-CM | POA: Insufficient documentation

## 2014-10-25 DIAGNOSIS — G44209 Tension-type headache, unspecified, not intractable: Secondary | ICD-10-CM | POA: Insufficient documentation

## 2014-10-25 DIAGNOSIS — Z3202 Encounter for pregnancy test, result negative: Secondary | ICD-10-CM

## 2014-10-25 MED ORDER — BUTALBITAL-APAP-CAFFEINE 50-325-40 MG PO TABS
2.0000 | ORAL_TABLET | Freq: Once | ORAL | Status: AC
  Administered 2014-10-26: 2 via ORAL
  Filled 2014-10-25: qty 2

## 2014-10-25 NOTE — MAU Provider Note (Signed)
History     CSN: [id number]  Arrival date and time: 10/25/14 2326   None     No chief complaint on file.  HPI   [name] is a 24 y.o. female G1P1001 presenting to MAU with multiple complaints including, HA, back pain, and abdominal pain.  Symptoms started 3 weeks ago. Her biggest concern is pregnancy and she feels her symptoms are related to her possibly being pregnant.   The patient does not have a history of migraine HA's, this headache has been present on and off for 3 weeks, she has not tried anything for the pain. She is allergic to NSAIDS; said she cannot take them because she had an ulcer in the past. The headache is band like; without light sensitivity.   Back pain is located on both sides of her lower back; symptoms have been present for 3 weeks. She has not tried any tylenol for the pain.   The patient see's Dr[facility] with [facility].    Patient declines pelvic exam for STI check.   OB History    Gravida Para Term Preterm AB TAB SAB Ectopic Multiple Living   1 1 1  0 0 0 0 0 0 1      Past Medical History  Diagnosis Date  . Hypoglycemia   . Ovarian cyst 2011  . Constipation   . Sickle cell trait   . History of dysmenorrhea   . Gastric ulcer     Past Surgical History  Procedure Laterality Date  . Laparoscopic ovarian cystectomy  2011  . Eye surgery      2004  . Laparotomy N/A 06/16/2012    Procedure: EXPLORATORY LAPAROTOMY;  Surgeon: Emelia Loron, MD;  Location: [facility] OR;  Service: General;  Laterality: N/A;  Repair of duodenal ulcer  . Repair of perforated ulcer N/A 06/16/2012    Procedure: REPAIR OF PERFORATED ULCER;  Surgeon: Emelia Loron, MD;  Location: [facility] OR;  Service: General;  Laterality: N/A;  duodenal    Family History  Problem Relation Age of Onset  . Anesthesia problems Neg Hx   . Hearing loss Neg Hx   . Breast cancer Paternal Grandmother   . Brain cancer      History  Substance Use Topics  . Smoking  status: Never Smoker   . Smokeless tobacco: Never Used  . Alcohol Use: No    Allergies:  Allergies  Allergen Reactions  . Nsaids     Diagnosed with a perforated duodenal ulcer due to NSAIDS    Prescriptions prior to admission  Medication Sig Dispense Refill Last Dose  . acetaminophen (TYLENOL) 325 MG tablet Take 650 mg by mouth every 6 (six) hours as needed for moderate pain.   unknown  . acyclovir (ZOVIRAX) 400 MG tablet Take 1 tablet (400 mg total) by mouth 3 (three) times daily. (Patient not taking: Reported on 05/12/2014) 21 tablet 0   . ciprofloxacin (CIPRO) 250 MG tablet Take 1 tablet (250 mg total) by mouth every 12 (twelve) hours. (Patient not taking: Reported on 05/12/2014) 10 tablet 0   . HYDROcodone-acetaminophen (NORCO/VICODIN) 5-325 MG per tablet Take 1 tablet by mouth every 4 (four) hours as needed for moderate pain. 10 tablet 0   . medroxyPROGESTERone (DEPO-PROVERA) 150 MG/ML injection Inject 150 mg into the muscle every 3 (three) months.   03/2014  . omeprazole (PRILOSEC) 20 MG capsule Take 1 capsule (20 mg total) by mouth daily. (Patient not taking: Reported on 05/12/2014) 30 capsule 0   .  ondansetron (ZOFRAN) 4 MG tablet Take 1 tablet (4 mg total) by mouth every 6 (six) hours. (Patient not taking: Reported on 05/12/2014) 12 tablet 0   . oxyCODONE-acetaminophen (PERCOCET) 5-325 MG per tablet Take 1 tablet by mouth every 4 (four) hours as needed for moderate pain. 10 tablet 0   . phenazopyridine (PYRIDIUM) 200 MG tablet Take 1 tablet (200 mg total) by mouth 3 (three) times daily. (Patient not taking: Reported on 05/12/2014) 6 tablet 0   . promethazine (PHENERGAN) 25 MG tablet Take 1 tablet (25 mg total) by mouth every 6 (six) hours as needed for nausea or vomiting. (Patient not taking: Reported on 05/12/2014) 30 tablet 0    Results for orders placed or performed during the hospital encounter of 10/25/14 (from the past 48 hour(s))  Urinalysis, Routine w reflex microscopic ([facility])     Status: Abnormal   Collection Time: 10/26/14 12:35 AM  Result Value Ref Range   Color, Urine YELLOW YELLOW   APPearance CLEAR CLEAR   Specific Gravity, Urine 1.010 1.005 - 1.030   pH 5.5 5.0 - 8.0   Glucose, UA NEGATIVE NEGATIVE mg/dL   Hgb urine dipstick TRACE (A) NEGATIVE   Bilirubin Urine NEGATIVE NEGATIVE   Ketones, ur NEGATIVE NEGATIVE mg/dL   Protein, ur NEGATIVE NEGATIVE mg/dL   Urobilinogen, UA 1.0 0.0 - 1.0 mg/dL   Nitrite NEGATIVE NEGATIVE   Leukocytes, UA TRACE (A) NEGATIVE  Urine microscopic-add on     Status: None   Collection Time: 10/26/14 12:35 AM  Result Value Ref Range   Squamous Epithelial / LPF RARE RARE   WBC, UA 0-2 <3 WBC/hpf   RBC / HPF 0-2 <3 RBC/hpf   Bacteria, UA RARE RARE  Pregnancy, urine POC     Status: None   Collection Time: 10/26/14 12:53 AM  Result Value Ref Range   Preg Test, Ur NEGATIVE NEGATIVE    Comment:        THE SENSITIVITY OF THIS METHODOLOGY IS >24 mIU/mL     Review of Systems  Constitutional: Negative for fever and chills.  Gastrointestinal: Positive for nausea and abdominal pain (Bilateral lower abdomininal pain. ). Negative for vomiting, diarrhea and constipation.  Genitourinary: Negative for dysuria and urgency.       Denies abnormal vaginal discharge.    Physical Exam   Blood pressure 132/78, pulse 93, temperature 98.6 F (37 C), temperature source Oral, resp. rate 16, height 5\' 3"  (1.6 m), weight 80.196 kg (176 lb 12.8 oz), last menstrual period 09/29/2014.  Physical Exam  Constitutional: She is oriented to person, place, and time. She appears well-developed and well-nourished. No distress.  HENT:  Head: Normocephalic.  Eyes: Pupils are equal, round, and reactive to light.  Neck: Neck supple.  Respiratory: Effort normal.  GI: Soft. Normal appearance. There is generalized tenderness.  Musculoskeletal: Normal range of motion.  Neurological: She is alert and oriented to person, place, and time. She has normal  strength. No sensory deficit. GCS eye subscore is 4. GCS verbal subscore is 5. GCS motor subscore is 6.  Skin: Skin is warm. She is not diaphoretic.  Psychiatric: Her behavior is normal.    MAU Course  Procedures  None  MDM Fioricet 2 tabs given; patient requested to leave prior to evaluating the effectiveness of the Fioricet.   Assessment and Plan    A:  1. Abdominal cramping   2. Encounter for pregnancy test with result negative   3. Tension-type headache, not intractable,  unspecified chronicity pattern     P:  Discharge home in stable condition RX: Fioricet #6 no refill Follow up with primary dr.  Susa Day always  Duane Lope, NP 10/26/2014 2:13 AM

## 2014-10-26 LAB — URINALYSIS, ROUTINE W REFLEX MICROSCOPIC
BILIRUBIN URINE: NEGATIVE
Glucose, UA: NEGATIVE mg/dL
Ketones, ur: NEGATIVE mg/dL
NITRITE: NEGATIVE
PH: 5.5 (ref 5.0–8.0)
PROTEIN: NEGATIVE mg/dL
SPECIFIC GRAVITY, URINE: 1.01 (ref 1.005–1.030)
Urobilinogen, UA: 1 mg/dL (ref 0.0–1.0)

## 2014-10-26 LAB — POCT PREGNANCY, URINE: Preg Test, Ur: NEGATIVE

## 2014-10-26 LAB — URINE MICROSCOPIC-ADD ON

## 2014-10-26 MED ORDER — BUTALBITAL-APAP-CAFFEINE 50-325-40 MG PO TABS: 1.0000 | ORAL_TABLET | Freq: Four times a day (QID) | ORAL | Status: DC | PRN

## 2014-10-26 NOTE — Discharge Instructions (Signed)
Abdominal Pain, Women °Abdominal (stomach, pelvic, or belly) pain can be caused by many things. It is important to tell your doctor: °· The location of the pain. °· Does it come and go or is it present all the time? °· Are there things that start the pain (eating certain foods, exercise)? °· Are there other symptoms associated with the pain (fever, nausea, vomiting, diarrhea)? °All of this is helpful to know when trying to find the cause of the pain. °CAUSES  °· Stomach: virus or bacteria infection, or ulcer. °· Intestine: appendicitis (inflamed appendix), regional ileitis (Crohn's disease), ulcerative colitis (inflamed colon), irritable bowel syndrome, diverticulitis (inflamed diverticulum of the colon), or cancer of the stomach or intestine. °· Gallbladder disease or stones in the gallbladder. °· Kidney disease, kidney stones, or infection. °· Pancreas infection or cancer. °· Fibromyalgia (pain disorder). °· Diseases of the female organs: °¨ Uterus: fibroid (non-cancerous) tumors or infection. °¨ Fallopian tubes: infection or tubal pregnancy. °¨ Ovary: cysts or tumors. °¨ Pelvic adhesions (scar tissue). °¨ Endometriosis (uterus lining tissue growing in the pelvis and on the pelvic organs). °¨ Pelvic congestion syndrome (female organs filling up with blood just before the menstrual period). °¨ Pain with the menstrual period. °¨ Pain with ovulation (producing an egg). °¨ Pain with an IUD (intrauterine device, birth control) in the uterus. °¨ Cancer of the female organs. °· Functional pain (pain not caused by a disease, may improve without treatment). °· Psychological pain. °· Depression. °DIAGNOSIS  °Your doctor will decide the seriousness of your pain by doing an examination. °· Blood tests. °· X-rays. °· Ultrasound. °· CT scan (computed tomography, special type of X-ray). °· MRI (magnetic resonance imaging). °· Cultures, for infection. °· Barium enema (dye inserted in the large intestine, to better view it with  X-rays). °· Colonoscopy (looking in intestine with a lighted tube). °· Laparoscopy (minor surgery, looking in abdomen with a lighted tube). °· Major abdominal exploratory surgery (looking in abdomen with a large incision). °TREATMENT  °The treatment will depend on the cause of the pain.  °· Many cases can be observed and treated at home. °· Over-the-counter medicines recommended by your caregiver. °· Prescription medicine. °· Antibiotics, for infection. °· Birth control pills, for painful periods or for ovulation pain. °· Hormone treatment, for endometriosis. °· Nerve blocking injections. °· Physical therapy. °· Antidepressants. °· Counseling with a psychologist or psychiatrist. °· Minor or major surgery. °HOME CARE INSTRUCTIONS  °· Do not take laxatives, unless directed by your caregiver. °· Take over-the-counter pain medicine only if ordered by your caregiver. Do not take aspirin because it can cause an upset stomach or bleeding. °· Try a clear liquid diet (broth or water) as ordered by your caregiver. Slowly move to a bland diet, as tolerated, if the pain is related to the stomach or intestine. °· Have a thermometer and take your temperature several times a day, and record it. °· Bed rest and sleep, if it helps the pain. °· Avoid sexual intercourse, if it causes pain. °· Avoid stressful situations. °· Keep your follow-up appointments and tests, as your caregiver orders. °· If the pain does not go away with medicine or surgery, you may try: °¨ Acupuncture. °¨ Relaxation exercises (yoga, meditation). °¨ Group therapy. °¨ Counseling. °SEEK MEDICAL CARE IF:  °· You notice certain foods cause stomach pain. °· Your home care treatment is not helping your pain. °· You need stronger pain medicine. °· You want your IUD removed. °· You feel faint or   lightheaded. °· You develop nausea and vomiting. °· You develop a rash. °· You are having side effects or an allergy to your medicine. °SEEK IMMEDIATE MEDICAL CARE IF:  °· Your  pain does not go away or gets worse. °· You have a fever. °· Your pain is felt only in portions of the abdomen. The right side could possibly be appendicitis. The left lower portion of the abdomen could be colitis or diverticulitis. °· You are passing blood in your stools (bright red or black tarry stools, with or without vomiting). °· You have blood in your urine. °· You develop chills, with or without a fever. °· You pass out. °MAKE SURE YOU:  °· Understand these instructions. °· Will watch your condition. °· Will get help right away if you are not doing well or get worse. °Document Released: 02/07/2007 Document Revised: 08/27/2013 Document Reviewed: 02/27/2009 °ExitCare® Patient Information ©2015 [employer]. This information is not intended to replace advice given to you by your health care provider. Make sure you discuss any questions you have with your health care provider. ° °

## 2014-12-30 ENCOUNTER — Encounter (HOSPITAL_COMMUNITY): Payer: Self-pay | Admitting: Vascular Surgery

## 2014-12-30 ENCOUNTER — Emergency Department (HOSPITAL_COMMUNITY)
Admission: EM | Admit: 2014-12-30 | Discharge: 2014-12-30 | Disposition: A | Discharge status: Home / Self Care | Payer: Medicaid Other | Attending: Emergency Medicine | Admitting: Emergency Medicine

## 2014-12-30 DIAGNOSIS — R109 Unspecified abdominal pain: Secondary | ICD-10-CM | POA: Diagnosis present

## 2014-12-30 DIAGNOSIS — Z862 Personal history of diseases of the blood and blood-forming organs and certain disorders involving the immune mechanism: Secondary | ICD-10-CM | POA: Diagnosis not present

## 2014-12-30 DIAGNOSIS — Z3202 Encounter for pregnancy test, result negative: Secondary | ICD-10-CM | POA: Insufficient documentation

## 2014-12-30 DIAGNOSIS — R1013 Epigastric pain: Secondary | ICD-10-CM

## 2014-12-30 DIAGNOSIS — Z8742 Personal history of other diseases of the female genital tract: Secondary | ICD-10-CM | POA: Insufficient documentation

## 2014-12-30 LAB — URINALYSIS, ROUTINE W REFLEX MICROSCOPIC
Bilirubin Urine: NEGATIVE
Glucose, UA: NEGATIVE mg/dL
Hgb urine dipstick: NEGATIVE
Ketones, ur: NEGATIVE mg/dL
NITRITE: NEGATIVE
Protein, ur: NEGATIVE mg/dL
SPECIFIC GRAVITY, URINE: 1.013 (ref 1.005–1.030)
Urobilinogen, UA: 0.2 mg/dL (ref 0.0–1.0)
pH: 6.5 (ref 5.0–8.0)

## 2014-12-30 LAB — COMPREHENSIVE METABOLIC PANEL
ALK PHOS: 61 U/L (ref 38–126)
ALT: 16 U/L (ref 14–54)
ANION GAP: 5 (ref 5–15)
AST: 15 U/L (ref 15–41)
Albumin: 3.7 g/dL (ref 3.5–5.0)
BILIRUBIN TOTAL: 0.6 mg/dL (ref 0.3–1.2)
BUN: 5 mg/dL — ABNORMAL LOW (ref 6–20)
CALCIUM: 9.3 mg/dL (ref 8.9–10.3)
CO2: 25 mmol/L (ref 22–32)
Chloride: 109 mmol/L (ref 101–111)
Creatinine, Ser: 0.97 mg/dL (ref 0.44–1.00)
GFR calc Af Amer: >60 mL/min (ref >60)
GFR calc non Af Amer: >60 mL/min (ref >60)
Glucose, Bld: 99 mg/dL (ref 65–99)
Potassium: 4 mmol/L (ref 3.5–5.1)
Sodium: 139 mmol/L (ref 135–145)
TOTAL PROTEIN: 6.4 g/dL — AB (ref 6.5–8.1)

## 2014-12-30 LAB — URINE MICROSCOPIC-ADD ON

## 2014-12-30 LAB — CBC
HEMATOCRIT: 42.2 % (ref 36.0–46.0)
HEMOGLOBIN: 14.5 g/dL (ref 12.0–15.0)
MCH: 27.4 pg (ref 26.0–34.0)
MCHC: 34.4 g/dL (ref 30.0–36.0)
MCV: 79.8 fL (ref 78.0–100.0)
Platelets: 262 10*3/uL (ref 150–400)
RBC: 5.29 MIL/uL — ABNORMAL HIGH (ref 3.87–5.11)
RDW: 12.9 % (ref 11.5–15.5)
WBC: 5.6 10*3/uL (ref 4.0–10.5)

## 2014-12-30 LAB — LIPASE, BLOOD: Lipase: 18 U/L — ABNORMAL LOW (ref 22–51)

## 2014-12-30 LAB — PREGNANCY, URINE: PREG TEST UR: NEGATIVE

## 2014-12-30 MED ORDER — PANTOPRAZOLE SODIUM 20 MG PO TBEC: 20.0000 mg | DELAYED_RELEASE_TABLET | Freq: Every day | ORAL | Status: DC

## 2014-12-30 MED ORDER — GI COCKTAIL ~~LOC~~
30.0000 mL | Freq: Once | ORAL | Status: AC
  Administered 2014-12-30: 30 mL via ORAL
  Filled 2014-12-30: qty 30

## 2014-12-30 MED ORDER — OXYCODONE-ACETAMINOPHEN 5-325 MG PO TABS: 1.0000 | ORAL_TABLET | Freq: Once | ORAL | Status: DC

## 2014-12-30 MED ORDER — PROMETHAZINE HCL 25 MG PO TABS: 25.0000 mg | ORAL_TABLET | Freq: Four times a day (QID) | ORAL | Status: DC | PRN

## 2014-12-30 NOTE — ED Provider Notes (Signed)
CSN: [id number]     Arrival date & time 12/30/14  1250 History   First MD Initiated Contact with Patient 12/30/14 1401     Chief Complaint  Patient presents with  . Abdominal Pain     Patient is a 24 y.o. female presenting with abdominal pain. The history is provided by the patient. No language interpreter was used.  Abdominal Pain  [name] presents for evaluation of abdominal pain.  She reports five days of epigastric abdominal pain.  Pain is worse with eating and is associated with SOB and nausea.  She took a home pregnancy test and it was negative.  She has some mild diarrhea at home.  No melena or hematochezia, no leg swelling, no chest pain.  She has a hx/o perforated peptic ulcer 2 years ago due to mobic use, she no longer uses nsaids.    Past Medical History  Diagnosis Date  . Hypoglycemia   . Ovarian cyst 2011  . Constipation   . Sickle cell trait   . History of dysmenorrhea   . Gastric ulcer    Past Surgical History  Procedure Laterality Date  . Laparoscopic ovarian cystectomy  2011  . Eye surgery      2004  . Laparotomy N/A 06/16/2012    Procedure: EXPLORATORY LAPAROTOMY;  Surgeon: Emelia Loron, MD;  Location: [facility] OR;  Service: General;  Laterality: N/A;  Repair of duodenal ulcer  . Repair of perforated ulcer N/A 06/16/2012    Procedure: REPAIR OF PERFORATED ULCER;  Surgeon: Emelia Loron, MD;  Location: [facility] OR;  Service: General;  Laterality: N/A;  duodenal   Family History  Problem Relation Age of Onset  . Anesthesia problems Neg Hx   . Hearing loss Neg Hx   . Breast cancer Paternal Grandmother   . Brain cancer     Social History  Substance Use Topics  . Smoking status: Never Smoker   . Smokeless tobacco: Never Used  . Alcohol Use: No   OB History    Gravida Para Term Preterm AB TAB SAB Ectopic Multiple Living   1 1 1  0 0 0 0 0 0 1     Review of Systems  Gastrointestinal: Positive for abdominal pain.  All other systems reviewed and are  negative.     Allergies  Nsaids  Home Medications   Prior to Admission medications   Medication Sig Start Date End Date Taking? Authorizing Provider  pantoprazole (PROTONIX) 20 MG tablet Take 1 tablet (20 mg total) by mouth daily. 12/30/14   Tilden Fossa, MD  promethazine (PHENERGAN) 25 MG tablet Take 1 tablet (25 mg total) by mouth every 6 (six) hours as needed for nausea or vomiting. 12/30/14   Tilden Fossa, MD   BP 108/62 mmHg  Pulse 75  Temp(Src) 98.1 F (36.7 C) (Oral)  Resp 16  Wt 173 lb 14.4 oz (78.881 kg)  SpO2 100% Physical Exam  Constitutional: She is oriented to person, place, and time. She appears well-developed and well-nourished.  HENT:  Head: Normocephalic and atraumatic.  Cardiovascular: Normal rate and regular rhythm.   No murmur heard. Pulmonary/Chest: Effort normal and breath sounds normal. No respiratory distress.  Abdominal: Soft. There is no rebound and no guarding.  Mild epigastric tenderness  Musculoskeletal: She exhibits no edema or tenderness.  Neurological: She is alert and oriented to person, place, and time.  Skin: Skin is warm and dry.  Psychiatric: She has a normal mood and affect. Her behavior is normal.  Nursing  note and vitals reviewed.   ED Course  Procedures (including critical care time) Labs Review Labs Reviewed  LIPASE, BLOOD - Abnormal; Notable for the following:    Lipase 18 (*)    All other components within normal limits  COMPREHENSIVE METABOLIC PANEL - Abnormal; Notable for the following:    BUN 5 (*)    Total Protein 6.4 (*)    All other components within normal limits  CBC - Abnormal; Notable for the following:    RBC 5.29 (*)    All other components within normal limits  URINALYSIS, ROUTINE W REFLEX MICROSCOPIC (NOT AT [facility]) - Abnormal; Notable for the following:    Leukocytes, UA SMALL (*)    All other components within normal limits  URINE MICROSCOPIC-ADD ON - Abnormal; Notable for the following:    Squamous  Epithelial / LPF FEW (*)    Bacteria, UA FEW (*)    All other components within normal limits  PREGNANCY, URINE    Imaging Review No results found. I have personally reviewed and evaluated these images and lab results as part of my medical decision-making.   EKG Interpretation None      MDM   Final diagnoses:  Epigastric abdominal pain    Pt with hx/o PUD here with epigastric pain with meals.  Presentation is not c/w cholecystitis, perforated peptic ulcer, bowel obstruction.  Sxs were relieved in the ED after GI cocktail.  Discussed with pt home care for gastritis vs ulcer with protonix, PCP follow up, return precautions.     Tilden Fossa, MD 12/30/14 [phone]

## 2014-12-30 NOTE — Discharge Instructions (Signed)

## 2014-12-30 NOTE — ED Notes (Signed)
Pt reports to the ED for eval of abd pain, nausea, and SOB since Wednesday. She reports she has had these symptoms last time and she had a perforated ulcer. Pt also reports diarrhea. Denies any blood in her stool or emesis. Pt reports chills but unsure of fevers and increased urination. Denies any dysuria. Has not been taking NSAIDs but has been under a lot of stress lately. Pt A&Ox4, resp e/u, and skin warm and dry.

## 2015-01-23 ENCOUNTER — Encounter (HOSPITAL_COMMUNITY): Payer: Self-pay | Admitting: *Deleted

## 2015-01-23 ENCOUNTER — Inpatient Hospital Stay (HOSPITAL_COMMUNITY)
Admission: AD | Admit: 2015-01-23 | Discharge: 2015-01-23 | Disposition: A | Discharge status: Home / Self Care | Payer: Medicaid Other | Source: Ambulatory Visit | Attending: Obstetrics | Admitting: Obstetrics

## 2015-01-23 DIAGNOSIS — Z3200 Encounter for pregnancy test, result unknown: Secondary | ICD-10-CM | POA: Diagnosis present

## 2015-01-23 DIAGNOSIS — Z3202 Encounter for pregnancy test, result negative: Secondary | ICD-10-CM | POA: Diagnosis not present

## 2015-01-23 DIAGNOSIS — N923 Ovulation bleeding: Secondary | ICD-10-CM

## 2015-01-23 DIAGNOSIS — N921 Excessive and frequent menstruation with irregular cycle: Secondary | ICD-10-CM

## 2015-01-23 LAB — URINALYSIS, ROUTINE W REFLEX MICROSCOPIC
BILIRUBIN URINE: NEGATIVE
Glucose, UA: NEGATIVE mg/dL
HGB URINE DIPSTICK: NEGATIVE
Ketones, ur: NEGATIVE mg/dL
Nitrite: NEGATIVE
PH: 5.5 (ref 5.0–8.0)
Protein, ur: NEGATIVE mg/dL
Urobilinogen, UA: 0.2 mg/dL (ref 0.0–1.0)

## 2015-01-23 LAB — URINE MICROSCOPIC-ADD ON

## 2015-01-23 LAB — POCT PREGNANCY, URINE: Preg Test, Ur: NEGATIVE

## 2015-01-23 NOTE — MAU Note (Signed)
Pt reports cramping, nausea, vomiting, lower back pain, and spotting.

## 2015-01-23 NOTE — Discharge Instructions (Signed)
° ° °  OB/GYN Providers [facility]    [facility]  & Infertility  [phone]     Phone: [phone]          [facility]                      [facility]   [place]     Phone: [phone]  Phone: [phone]         [facility]     Phone: [phone]  Phone: [phone]           [facility] [place]                hone: [phone]  Phone: [phone]         [facility] Dr. Francoise Ceo      Phone: [phone]  Phone: [phone]         [facility] Dept.                Phone: [phone]  [facility]   [address] [place])          Phone: [phone] [facility]   Phone: [phone]

## 2015-01-23 NOTE — MAU Provider Note (Signed)
History     CSN: [id number]  Arrival date and time: 01/23/15 2001   First Provider Initiated Contact with Patient 01/23/15 2034      No chief complaint on file.  HPI Comments: [name] is a 24 y.o. G1P1001 who presents today for a pregnancy test. She states that her last period was in the middle of July. She states that she has not done a pregnancy test at home. She denies any abdominal pain, nausea, vomiting. She states that she has had some spotting, but has been on and off depo-provera. So her periods are generally irregular.  Vaginal Bleeding The patient's primary symptoms include vaginal bleeding. This is a new problem. The current episode started in the past 7 days. The problem occurs intermittently. The problem has been unchanged. The patient is experiencing no pain. She is not pregnant. Pertinent negatives include no abdominal pain, constipation, diarrhea, dysuria, fever, frequency, nausea, urgency or vomiting. The vaginal bleeding is spotting. She has not been passing clots. She has not been passing tissue. Nothing aggravates the symptoms. She has tried nothing for the symptoms. She uses nothing for contraception. Her menstrual history has been irregular.     Past Medical History  Diagnosis Date  . Hypoglycemia   . Ovarian cyst 2011  . Constipation   . Sickle cell trait   . History of dysmenorrhea   . Gastric ulcer     Past Surgical History  Procedure Laterality Date  . Laparoscopic ovarian cystectomy  2011  . Eye surgery      2004  . Laparotomy N/A 06/16/2012    Procedure: EXPLORATORY LAPAROTOMY;  Surgeon: Emelia Loron, MD;  Location: [facility] OR;  Service: General;  Laterality: N/A;  Repair of duodenal ulcer  . Repair of perforated ulcer N/A 06/16/2012    Procedure: REPAIR OF PERFORATED ULCER;  Surgeon: Emelia Loron, MD;  Location: [facility] OR;  Service: General;  Laterality: N/A;  duodenal    Family History  Problem Relation Age of Onset  . Anesthesia  problems Neg Hx   . Hearing loss Neg Hx   . Breast cancer Paternal Grandmother   . Brain cancer      Social History  Substance Use Topics  . Smoking status: Never Smoker   . Smokeless tobacco: Never Used  . Alcohol Use: No    Allergies:  Allergies  Allergen Reactions  . Nsaids     Diagnosed with a perforated duodenal ulcer due to NSAIDS    Prescriptions prior to admission  Medication Sig Dispense Refill Last Dose  . pantoprazole (PROTONIX) 20 MG tablet Take 1 tablet (20 mg total) by mouth daily. 30 tablet 0   . promethazine (PHENERGAN) 25 MG tablet Take 1 tablet (25 mg total) by mouth every 6 (six) hours as needed for nausea or vomiting. 10 tablet 0     Review of Systems  Constitutional: Negative for fever.  Gastrointestinal: Negative for nausea, vomiting, abdominal pain, diarrhea and constipation.  Genitourinary: Positive for vaginal bleeding. Negative for dysuria, urgency and frequency.   Physical Exam   Blood pressure 132/85, pulse 91, resp. rate 16, height  (1.6 m), weight 80.287 kg (177 lb), last menstrual period 11/08/2014, SpO2 98 %.  Physical Exam  Nursing note and vitals reviewed. Constitutional: She is oriented to person, place, and time. She appears well-developed and well-nourished. No distress.  HENT:  Head: Normocephalic.  Cardiovascular: Normal rate.   Respiratory: Effort normal.  Musculoskeletal: Normal range of motion.  Neurological: She is  alert and oriented to person, place, and time.  Skin: Skin is warm and dry.  Psychiatric: She has a normal mood and affect.   Results for orders placed or performed during the hospital encounter of 01/23/15 (from the past 24 hour(s))  Urinalysis, Routine w reflex microscopic (not at [facility])     Status: Abnormal   Collection Time: 01/23/15  8:09 PM  Result Value Ref Range   Color, Urine YELLOW YELLOW   APPearance CLEAR CLEAR   Specific Gravity, Urine >1.030 (H) 1.005 - 1.030   pH 5.5 5.0 - 8.0   Glucose, UA  NEGATIVE NEGATIVE mg/dL   Hgb urine dipstick NEGATIVE NEGATIVE   Bilirubin Urine NEGATIVE NEGATIVE   Ketones, ur NEGATIVE NEGATIVE mg/dL   Protein, ur NEGATIVE NEGATIVE mg/dL   Urobilinogen, UA 0.2 0.0 - 1.0 mg/dL   Nitrite NEGATIVE NEGATIVE   Leukocytes, UA SMALL (A) NEGATIVE  Urine microscopic-add on     Status: Abnormal   Collection Time: 01/23/15  8:09 PM  Result Value Ref Range   Squamous Epithelial / LPF RARE RARE   WBC, UA 3-6 <3 WBC/hpf   RBC / HPF 0-2 <3 RBC/hpf   Bacteria, UA FEW (A) RARE  Pregnancy, urine POC     Status: None   Collection Time: 01/23/15  8:21 PM  Result Value Ref Range   Preg Test, Ur NEGATIVE NEGATIVE    MAU Course  Procedures  MDM   Assessment and Plan   1. Encounter for pregnancy test with result negative   2. Spotting between menses    DC home Comfort measures reviewed  Menstrual calendar instructions  RX: none  Return to MAU as needed   Follow-up Information    Follow up with [facility] [place].   Why:  As needed   Contact information:   [address] [facility] [address] [phone]        Tawnya Crook 01/23/2015, 8:35 PM

## 2015-03-08 ENCOUNTER — Encounter (HOSPITAL_COMMUNITY): Payer: Self-pay | Admitting: *Deleted

## 2015-03-08 ENCOUNTER — Inpatient Hospital Stay (HOSPITAL_COMMUNITY)
Admission: AD | Admit: 2015-03-08 | Discharge: 2015-03-08 | Disposition: A | Discharge status: Home / Self Care | Payer: Self-pay | Source: Ambulatory Visit | Attending: Obstetrics | Admitting: Obstetrics

## 2015-03-08 DIAGNOSIS — R112 Nausea with vomiting, unspecified: Secondary | ICD-10-CM

## 2015-03-08 DIAGNOSIS — D573 Sickle-cell trait: Secondary | ICD-10-CM | POA: Insufficient documentation

## 2015-03-08 DIAGNOSIS — A084 Viral intestinal infection, unspecified: Secondary | ICD-10-CM | POA: Insufficient documentation

## 2015-03-08 LAB — URINALYSIS, ROUTINE W REFLEX MICROSCOPIC
Bilirubin Urine: NEGATIVE
Glucose, UA: NEGATIVE mg/dL
Hgb urine dipstick: NEGATIVE
Ketones, ur: NEGATIVE mg/dL
LEUKOCYTES UA: NEGATIVE
NITRITE: NEGATIVE
PH: 5.5 (ref 5.0–8.0)
Protein, ur: NEGATIVE mg/dL
SPECIFIC GRAVITY, URINE: 1.025 (ref 1.005–1.030)
Urobilinogen, UA: 0.2 mg/dL (ref 0.0–1.0)

## 2015-03-08 LAB — CBC
HCT: 40 % (ref 36.0–46.0)
Hemoglobin: 13.9 g/dL (ref 12.0–15.0)
MCH: 27.5 pg (ref 26.0–34.0)
MCHC: 34.8 g/dL (ref 30.0–36.0)
MCV: 79.2 fL (ref 78.0–100.0)
Platelets: 243 10*3/uL (ref 150–400)
RBC: 5.05 MIL/uL (ref 3.87–5.11)
RDW: 13.1 % (ref 11.5–15.5)
WBC: 7 10*3/uL (ref 4.0–10.5)

## 2015-03-08 MED ORDER — SODIUM CHLORIDE 0.9 % IV SOLN
8.0000 mg | Freq: Once | INTRAVENOUS | Status: DC
  Filled 2015-03-08: qty 4

## 2015-03-08 MED ORDER — ONDANSETRON HCL 8 MG PO TABS: 8.0000 mg | ORAL_TABLET | Freq: Three times a day (TID) | ORAL | Status: DC | PRN

## 2015-03-08 MED ORDER — METOCLOPRAMIDE HCL 10 MG PO TABS: 10.0000 mg | ORAL_TABLET | Freq: Two times a day (BID) | ORAL | Status: DC

## 2015-03-08 MED ORDER — ONDANSETRON HCL 4 MG PO TABS
8.0000 mg | ORAL_TABLET | Freq: Once | ORAL | Status: AC
  Administered 2015-03-08: 8 mg via ORAL
  Filled 2015-03-08: qty 2

## 2015-03-08 MED ORDER — METOCLOPRAMIDE HCL 10 MG PO TABS
10.0000 mg | ORAL_TABLET | Freq: Once | ORAL | Status: AC
  Administered 2015-03-08: 10 mg via ORAL
  Filled 2015-03-08: qty 1

## 2015-03-08 MED ORDER — LACTATED RINGERS IV SOLN: Freq: Once | INTRAVENOUS | Status: DC

## 2015-03-08 NOTE — MAU Note (Signed)
Having n/v for a week. Has increased to 3-5 times per day.  Has softer stools in am.  Not used to daily BM but has being doing daily,

## 2015-03-08 NOTE — Progress Notes (Signed)
Pt stuck x3 for IV fluids.  Pt requests medication by mouth

## 2015-03-08 NOTE — MAU Note (Signed)
Having lower belly intermittant cramping.  Last depo shot 04/2014 no other methods.  No new sex partner for past 5 year. Denies vaginal bleeding, discharge

## 2015-03-08 NOTE — Discharge Instructions (Signed)

## 2015-03-08 NOTE — MAU Provider Note (Signed)
History     CSN: [id number]  Arrival date and time: 03/08/15 1223   First Provider Initiated Contact with Patient 03/08/15 1257      Chief Complaint  Patient presents with  . Nausea  . Emesis  . Diarrhea   HPI  [name] 23 y.o. G1P1001 presents with lower abdominal pain , diarrhea, vomiting 3 x day and nausea x 1 week  Past Medical History  Diagnosis Date  . Hypoglycemia   . Ovarian cyst 2011  . Constipation   . Sickle cell trait (HCC)   . History of dysmenorrhea   . Gastric ulcer     Past Surgical History  Procedure Laterality Date  . Laparoscopic ovarian cystectomy  2011  . Eye surgery      2004  . Laparotomy N/A 06/16/2012    Procedure: EXPLORATORY LAPAROTOMY;  Surgeon: Emelia Loron, MD;  Location: [facility] OR;  Service: General;  Laterality: N/A;  Repair of duodenal ulcer  . Repair of perforated ulcer N/A 06/16/2012    Procedure: REPAIR OF PERFORATED ULCER;  Surgeon: Emelia Loron, MD;  Location: [facility] OR;  Service: General;  Laterality: N/A;  duodenal    Family History  Problem Relation Age of Onset  . Anesthesia problems Neg Hx   . Hearing loss Neg Hx   . Breast cancer Paternal Grandmother   . Brain cancer      Social History  Substance Use Topics  . Smoking status: Never Smoker   . Smokeless tobacco: Never Used  . Alcohol Use: No    Allergies:  Allergies  Allergen Reactions  . Nsaids     Diagnosed with a perforated duodenal ulcer due to NSAIDS    Prescriptions prior to admission  Medication Sig Dispense Refill Last Dose  . pantoprazole (PROTONIX) 20 MG tablet Take 1 tablet (20 mg total) by mouth daily. (Patient not taking: Reported on 03/08/2015) 30 tablet 0 Not Taking at Unknown time  . promethazine (PHENERGAN) 25 MG tablet Take 1 tablet (25 mg total) by mouth every 6 (six) hours as needed for nausea or vomiting. (Patient not taking: Reported on 03/08/2015) 10 tablet 0 Not Taking at Unknown time    Review of Systems   Constitutional: Negative for fever.  Gastrointestinal: Positive for nausea, vomiting, abdominal pain and diarrhea.  All other systems reviewed and are negative.  Physical Exam   Blood pressure 131/87, pulse 87, temperature 98.2 F (36.8 C), temperature source Oral, resp. rate 18, height  (1.6 m), weight 77.111 kg (170 lb).  Physical Exam  Nursing note and vitals reviewed. Constitutional: She is oriented to person, place, and time. She appears well-developed and well-nourished. No distress.  HENT:  Head: Normocephalic and atraumatic.  Neck: Normal range of motion.  Cardiovascular: Normal rate.   Respiratory: Effort normal and breath sounds normal.  GI: Soft. She exhibits no distension. There is no tenderness.  Musculoskeletal: Normal range of motion.  Neurological: She is alert and oriented to person, place, and time.  Skin: Skin is warm and dry.  Psychiatric: She has a normal mood and affect. Her behavior is normal. Judgment and thought content normal.    MAU Course  Procedures  MDM After unsuccessful attempts at starting an IV; Pt states she wants medicine by mouth. Antiemtic given and will discharge to home Results for orders placed or performed during the hospital encounter of 03/08/15 (from the past 24 hour(s))  Urinalysis, Routine w reflex microscopic (not at [facility])     Status: None  Collection Time: 03/08/15 12:33 PM  Result Value Ref Range   Color, Urine YELLOW YELLOW   APPearance CLEAR CLEAR   Specific Gravity, Urine 1.025 1.005 - 1.030   pH 5.5 5.0 - 8.0   Glucose, UA NEGATIVE NEGATIVE mg/dL   Hgb urine dipstick NEGATIVE NEGATIVE   Bilirubin Urine NEGATIVE NEGATIVE   Ketones, ur NEGATIVE NEGATIVE mg/dL   Protein, ur NEGATIVE NEGATIVE mg/dL   Urobilinogen, UA 0.2 0.0 - 1.0 mg/dL   Nitrite NEGATIVE NEGATIVE   Leukocytes, UA NEGATIVE NEGATIVE  CBC     Status: None   Collection Time: 03/08/15  1:20 PM  Result Value Ref Range   WBC 7.0 4.0 - 10.5 K/uL   RBC  5.05 3.87 - 5.11 MIL/uL   Hemoglobin 13.9 12.0 - 15.0 g/dL   HCT 62.640.0 94.836.0 - 54.646.0 %   MCV 79.2 78.0 - 100.0 fL   MCH 27.5 26.0 - 34.0 pg   MCHC 34.8 30.0 - 36.0 g/dL   RDW 27.013.1 35.011.5 - 09.315.5 %   Platelets 243 150 - 400 K/uL    Assessment and Plan  Viral gastroenteritis RX: Reglan; Zofran Discharge to home   Mer[facility] Medical CenterClemmons,Lori Grissett 03/08/2015, 2:52 PM

## 2015-03-27 ENCOUNTER — Encounter (HOSPITAL_COMMUNITY): Payer: Self-pay

## 2015-03-27 ENCOUNTER — Emergency Department (HOSPITAL_COMMUNITY)
Admission: EM | Admit: 2015-03-27 | Discharge: 2015-03-27 | Disposition: A | Discharge status: Home / Self Care | Payer: Medicaid Other | Attending: Emergency Medicine | Admitting: Emergency Medicine

## 2015-03-27 DIAGNOSIS — Z79899 Other long term (current) drug therapy: Secondary | ICD-10-CM | POA: Insufficient documentation

## 2015-03-27 DIAGNOSIS — Z862 Personal history of diseases of the blood and blood-forming organs and certain disorders involving the immune mechanism: Secondary | ICD-10-CM | POA: Insufficient documentation

## 2015-03-27 DIAGNOSIS — R52 Pain, unspecified: Secondary | ICD-10-CM

## 2015-03-27 DIAGNOSIS — H9209 Otalgia, unspecified ear: Secondary | ICD-10-CM | POA: Insufficient documentation

## 2015-03-27 DIAGNOSIS — Z8742 Personal history of other diseases of the female genital tract: Secondary | ICD-10-CM | POA: Insufficient documentation

## 2015-03-27 DIAGNOSIS — Z8639 Personal history of other endocrine, nutritional and metabolic disease: Secondary | ICD-10-CM | POA: Insufficient documentation

## 2015-03-27 DIAGNOSIS — R110 Nausea: Secondary | ICD-10-CM | POA: Insufficient documentation

## 2015-03-27 DIAGNOSIS — J029 Acute pharyngitis, unspecified: Secondary | ICD-10-CM | POA: Insufficient documentation

## 2015-03-27 DIAGNOSIS — Z8544 Personal history of malignant neoplasm of other female genital organs: Secondary | ICD-10-CM | POA: Insufficient documentation

## 2015-03-27 MED ORDER — ONDANSETRON 4 MG PO TBDP: 4.0000 mg | ORAL_TABLET | Freq: Three times a day (TID) | ORAL | Status: DC | PRN

## 2015-03-27 NOTE — ED Notes (Signed)
Pt placed in room, given a warm blanket.

## 2015-03-27 NOTE — ED Provider Notes (Signed)
CSN: [id number]     Arrival date & time 03/27/15  1731 History  By signing my name below, I, [name], attest that this documentation has been prepared under the direction and in the presence of non-physician practitioner, Sharilyn SitesLisa Manville Rico, PA-C. Electronically Signed: Freida Busmaniana Omoyeni, Scribe. 03/27/2015. 6:29 PM.   Chief Complaint  Patient presents with  . Sore Throat   The history is provided by the patient. No language interpreter was used.    HPI Comments:  [name] is a 24 y.o. female who presents to the Emergency Department complaining of  sore throat for a few days. She has been able to swallow her secretions but reports increased pain when doing so. She reports associated ear pain, body aches,  Nausea, and cough. Positive sick contacts- her manager who was sick with the flu. No alleviating factors noted. At this time pt rates her pain 10/10.   Has been taking OTC theraflu without relief.  VSS.  Past Medical History  Diagnosis Date  . Hypoglycemia   . Ovarian cyst 2011  . Constipation   . Sickle cell trait (HCC)   . History of dysmenorrhea   . Gastric ulcer    Past Surgical History  Procedure Laterality Date  . Laparoscopic ovarian cystectomy  2011  . Eye surgery      2004  . Laparotomy N/A 06/16/2012    Procedure: EXPLORATORY LAPAROTOMY;  Surgeon: Emelia LoronMatthew Wakefield, MD;  [facility];  Service: General;  Laterality: N/A;  Repair of duodenal ulcer  . Repair of perforated ulcer N/A 06/16/2012    Procedure: REPAIR OF PERFORATED ULCER;  Surgeon: Emelia LoronMatthew Wakefield, MD;  [facility];  Service: General;  Laterality: N/A;  duodenal   Family History  Problem Relation Age of Onset  . Anesthesia problems Neg Hx   . Hearing loss Neg Hx   . Breast cancer Paternal Grandmother   . Sherry cancer     Social History  Substance Use Topics  . Smoking status: Never Smoker   . Smokeless tobacco: Never Used  . Alcohol Use: No   OB History    Gravida Para Term Preterm AB TAB  SAB Ectopic Multiple Living   1 1 1  0 0 0 0 0 0 1     Review of Systems  Constitutional: Negative for fever and chills.  HENT: Positive for ear pain and sore throat. Negative for trouble swallowing.   Respiratory: Positive for cough. Negative for shortness of breath.   Cardiovascular: Negative for chest pain.  Gastrointestinal: Positive for nausea.  Musculoskeletal: Positive for myalgias (generalized).  All other systems reviewed and are negative.   Allergies  Nsaids  Home Medications   Prior to Admission medications   Medication Sig Start Date End Date Taking? Authorizing Provider  metoCLOPramide (REGLAN) 10 MG tablet Take 1 tablet (10 mg total) by mouth 2 (two) times daily at 8 am and 10 pm. 03/08/15 03/07/16  Lori A Clemmons, CNM  ondansetron (ZOFRAN ODT) 4 MG disintegrating tablet Take 1 tablet (4 mg total) by mouth every 8 (eight) hours as needed for nausea. 03/27/15   Garlon HatchetLisa M Jezabel Lecker, PA-C  pantoprazole (PROTONIX) 20 MG tablet Take 1 tablet (20 mg total) by mouth daily. Patient not taking: Reported on 03/08/2015 12/30/14   Tilden FossaElizabeth Rees, MD  promethazine (PHENERGAN) 25 MG tablet Take 1 tablet (25 mg total) by mouth every 6 (six) hours as needed for nausea or vomiting. Patient not taking: Reported on 03/08/2015 12/30/14   Tilden FossaElizabeth Rees,  MD   BP 125/87 mmHg  Pulse 91  Temp(Src) 98.9 F (37.2 C) (Oral)  Resp 16  Ht  (1.626 m)  Wt 170 lb (77.111 kg)  BMI 29.17 kg/m2  SpO2 99%  LMP  (LMP Unknown)   Physical Exam  Constitutional: She is oriented to person, place, and time. She appears well-developed and well-nourished. No distress.  HENT:  Head: Normocephalic and atraumatic.  Right Ear: Tympanic membrane and ear canal normal.  Left Ear: Tympanic membrane and ear canal normal.  Nose: Nose normal.  Mouth/Throat: Uvula is midline and mucous membranes are normal. Posterior oropharyngeal erythema (mild) present. No oropharyngeal exudate, posterior oropharyngeal edema or  tonsillar abscesses.  Eyes: Conjunctivae and EOM are normal. Pupils are equal, round, and reactive to light.  Neck: Normal range of motion. Neck supple.  Cardiovascular: Normal rate, regular rhythm and normal heart sounds.   Pulmonary/Chest: Effort normal and breath sounds normal. No respiratory distress. She has no wheezes.  Abdominal: Soft. Bowel sounds are normal. There is no tenderness. There is no guarding.  Musculoskeletal: Normal range of motion.  Neurological: She is alert and oriented to person, place, and time.  Skin: Skin is warm and dry. She is not diaphoretic.  Psychiatric: She has a normal mood and affect.  Nursing note and vitals reviewed.   ED Course  Procedures   DIAGNOSTIC STUDIES:  Oxygen Saturation is 99% on RA, normal by my interpretation.    COORDINATION OF CARE:  6:27 PM Discussed treatment plan with pt at bedside and pt agreed to plan.   MDM   Final diagnoses:  Sore throat  Body aches  Nausea   24 year old female here with flulike symptoms. Complains of earache, sore throat, nasal congestion nausea, and generalized body aches. Patient is afebrile, nontoxic. Her exam is overall noninfectious. No clinical signs of strep throat. Lungs are clear bilaterally without wheezes or rhonchi to suggest pneumonia.  Patient's symptoms are likely viral in nature, possibly influenza given sick contacts with the same. Discussed supportive care including over-the-counter antitussives, Zofran, fluids, and rest. Patient will follow-up with her PCP.  Discussed plan with patient, he/she acknowledged understanding and agreed with plan of care.  Return precautions given for new or worsening symptoms.  I personally performed the services described in this documentation, which was scribed in my presence. The recorded information has been reviewed and is accurate.  Garlon Hatchet, PA-C 03/27/15 1856  Doug Sou, MD 03/28/15 0130

## 2015-03-27 NOTE — Discharge Instructions (Signed)
Take the prescribed medication as directed.  This should help with nausea. May continue taking over the count cough/cold medications as needed.  Chloraseptic spray may help with the throat pain. Try to eat and drink fluids regularly, especially while taking medications. Follow-up with your primary care physician. Return to the ED for new or worsening symptoms.

## 2015-03-27 NOTE — ED Notes (Signed)
Pt here for "flu like symptoms" with c/o of bilateral earaches, sore throat, nasal congestion, nausea and all over body aches and pains "everything just hurts." Pt reports coughing up clear mucus with cough.

## 2015-03-29 ENCOUNTER — Encounter (HOSPITAL_COMMUNITY): Payer: Self-pay | Admitting: Emergency Medicine

## 2015-03-29 ENCOUNTER — Emergency Department (HOSPITAL_COMMUNITY): Payer: Medicaid Other

## 2015-03-29 ENCOUNTER — Emergency Department (HOSPITAL_COMMUNITY)
Admission: EM | Admit: 2015-03-29 | Discharge: 2015-03-29 | Disposition: A | Discharge status: Home / Self Care | Payer: Medicaid Other | Attending: Emergency Medicine | Admitting: Emergency Medicine

## 2015-03-29 DIAGNOSIS — R1110 Vomiting, unspecified: Secondary | ICD-10-CM | POA: Insufficient documentation

## 2015-03-29 DIAGNOSIS — Z8639 Personal history of other endocrine, nutritional and metabolic disease: Secondary | ICD-10-CM | POA: Insufficient documentation

## 2015-03-29 DIAGNOSIS — J020 Streptococcal pharyngitis: Secondary | ICD-10-CM | POA: Insufficient documentation

## 2015-03-29 DIAGNOSIS — Z8742 Personal history of other diseases of the female genital tract: Secondary | ICD-10-CM | POA: Insufficient documentation

## 2015-03-29 DIAGNOSIS — Z8719 Personal history of other diseases of the digestive system: Secondary | ICD-10-CM | POA: Insufficient documentation

## 2015-03-29 DIAGNOSIS — R5383 Other fatigue: Secondary | ICD-10-CM | POA: Insufficient documentation

## 2015-03-29 DIAGNOSIS — Z862 Personal history of diseases of the blood and blood-forming organs and certain disorders involving the immune mechanism: Secondary | ICD-10-CM | POA: Insufficient documentation

## 2015-03-29 LAB — RAPID STREP SCREEN (MED CTR MEBANE ONLY): Streptococcus, Group A Screen (Direct): POSITIVE — AB

## 2015-03-29 MED ORDER — ACETAMINOPHEN 500 MG PO TABS: 500.0000 mg | ORAL_TABLET | Freq: Four times a day (QID) | ORAL | Status: DC | PRN

## 2015-03-29 MED ORDER — PENICILLIN V POTASSIUM 500 MG PO TABS: 500.0000 mg | ORAL_TABLET | Freq: Two times a day (BID) | ORAL | Status: DC

## 2015-03-29 MED ORDER — PREDNISONE 20 MG PO TABS
60.0000 mg | ORAL_TABLET | Freq: Once | ORAL | Status: AC
  Administered 2015-03-29: 60 mg via ORAL
  Filled 2015-03-29: qty 3

## 2015-03-29 MED ORDER — ACETAMINOPHEN 325 MG PO TABS
650.0000 mg | ORAL_TABLET | Freq: Once | ORAL | Status: AC
  Administered 2015-03-29: 650 mg via ORAL
  Filled 2015-03-29: qty 2

## 2015-03-29 MED ORDER — PREDNISONE 20 MG PO TABS: 40.0000 mg | ORAL_TABLET | Freq: Every day | ORAL | Status: DC

## 2015-03-29 NOTE — ED Notes (Addendum)
Patient transported to X-ray 

## 2015-03-29 NOTE — ED Notes (Signed)
Bed: WA26 Expected date:  Expected time:  Means of arrival:  Comments: 

## 2015-03-29 NOTE — Discharge Instructions (Signed)
1. Medications: penicillin, prednisone, tylenol usual home medications 2. Treatment: rest, drink plenty of fluids; you can try warm honey, tea, throat lozenges for your symptoms 3. Follow Up: please followup with your primary doctor for discussion of your diagnoses and further evaluation after today's visit; if you do not have a primary care doctor use the resource guide provided to find one; please return to the ER for high fever, severe pain, difficulty swallowing or handling your secretions, shortness of breath   Strep Throat Strep throat is a bacterial infection of the throat. Your health care provider may call the infection tonsillitis or pharyngitis, depending on whether there is swelling in the tonsils or at the back of the throat. Strep throat is most common during the cold months of the year in children who are 595-23 years of age, but it can happen during any season in people of any age. This infection is spread from person to person (contagious) through coughing, sneezing, or close contact. CAUSES Strep throat is caused by the bacteria called Streptococcus pyogenes. RISK FACTORS This condition is more likely to develop in:  People who spend time in crowded places where the infection can spread easily.  People who have close contact with someone who has strep throat. SYMPTOMS Symptoms of this condition include:  Fever or chills.   Redness, swelling, or pain in the tonsils or throat.  Pain or difficulty when swallowing.  White or yellow spots on the tonsils or throat.  Swollen, tender glands in the neck or under the jaw.  Red rash all over the body (rare). DIAGNOSIS This condition is diagnosed by performing a rapid strep test or by taking a swab of your throat (throat culture test). Results from a rapid strep test are usually ready in a few minutes, but throat culture test results are available after one or two days. TREATMENT This condition is treated with antibiotic  medicine. HOME CARE INSTRUCTIONS Medicines  Take over-the-counter and prescription medicines only as told by your health care provider.  Take your antibiotic as told by your health care provider. Do not stop taking the antibiotic even if you start to feel better.  Have family members who also have a sore throat or fever tested for strep throat. They may need antibiotics if they have the strep infection. Eating and Drinking  Do not share food, drinking cups, or personal items that could cause the infection to spread to other people.  If swallowing is difficult, try eating soft foods until your sore throat feels better.  Drink enough fluid to keep your urine clear or pale yellow. General Instructions  Gargle with a salt-water mixture 3-4 times per day or as needed. To make a salt-water mixture, completely dissolve -1 tsp of salt in 1 cup of warm water.  Make sure that all household members wash their hands well.  Get plenty of rest.  Stay home from school or work until you have been taking antibiotics for 24 hours.  Keep all follow-up visits as told by your health care provider. This is important. SEEK MEDICAL CARE IF:  The glands in your neck continue to get bigger.  You develop a rash, cough, or earache.  You cough up a thick liquid that is green, yellow-brown, or bloody.  You have pain or discomfort that does not get better with medicine.  Your problems seem to be getting worse rather than better.  You have a fever. SEEK IMMEDIATE MEDICAL CARE IF:  You have new symptoms, such  as vomiting, severe headache, stiff or painful neck, chest pain, or shortness of breath.  You have severe throat pain, drooling, or changes in your voice.  You have swelling of the neck, or the skin on the neck becomes red and tender.  You have signs of dehydration, such as fatigue, dry mouth, and decreased urination.  You become increasingly sleepy, or you cannot wake up completely.  Your  joints become red or painful.   This information is not intended to replace advice given to you by your health care provider. Make sure you discuss any questions you have with your health care provider.   Document Released: 04/09/2000 Document Revised: 01/01/2015 Document Reviewed: 08/05/2014 Elsevier Interactive Patient Education 2016 [employer].    Emergency Department Resource Guide 1) Find a Doctor and Pay Out of Pocket Although you won't have to find out who is covered by your insurance plan, it is a good idea to ask around and get recommendations. You will then need to call the office and see if the doctor you have chosen will accept you as a new patient and what types of options they offer for patients who are self-pay. Some doctors offer discounts or will set up payment plans for their patients who do not have insurance, but you will need to ask so you aren't surprised when you get to your appointment.  2) Contact Your Local Health Department Not all health departments have doctors that can see patients for sick visits, but many do, so it is worth a call to see if yours does. If you don't know where your local health department is, you can check in your phone book. The CDC also has a tool to help you locate your state's health department, and many state websites also have listings of all of their local health departments.  3) Find a Walk-in Clinic If your illness is not likely to be very severe or complicated, you may want to try a walk in clinic. These are popping up all over the country in pharmacies, drugstores, and shopping centers. They're usually staffed by nurse practitioners or physician assistants that have been trained to treat common illnesses and complaints. They're usually fairly quick and inexpensive. However, if you have serious medical issues or chronic medical problems, these are probably not your best option.  No Primary Care Doctor: - Call Health Connect at  [phone]  - they can help you locate a primary care doctor that  accepts your insurance, provides certain services, etc. - Physician Referral Service- [phone]  Chronic Pain Problems: Organization         Address  Phone   Notes  [facility]  [phone] Patients need to be referred by their primary care doctor.   Medication Assistance: Organization         Address  Phone   Notes  [facility] Medication [facility] [address] [place] [place], [place] [address] [phone] --Must be a resident of [facility] -- Must have NO insurance coverage whatsoever (no Medicaid/ Medicare, etc.) -- The pt. MUST have a primary care doctor that directs their care regularly and follows them in the community   [facility]ist  [phone]   [employer]  [phone]    Agencies that provide inexpensive medical care: Organization         Address  Phone   Notes  [facility]  [phone]   [facility]    (  [phone]) [phone]   [facility] [address] [place], [place] [address] [phone]) [phone]   [facility] [address], [place] [phone]   Planned Parenthood    [phone](336) [phone]   [facility]    [phone]   [facility]  [address], [place] Phone:  [phone](336) [phone], Fax:  [phone] Hours of Operation:  9 am - 6 pm, M-F.  Also accepts Medicaid/Medicare and self-pay.  [facility]  [address], [place] Phone: [phone](336) [phone], Fax: [phone](336) [phone]. Hours of Operation:  8:30 am - 5:30 pm, M-F.  Also accepts Medicaid and self-pay.  [facility] [place] [address], [place] Phone: [phone](336) [phone]   [facility] [address], [place], [place] [phone], Ext. [phone] Mondays & Thursdays: 7-9 AM.  First 15 patients are seen on a first come, first serve basis.     Medicaid-accepting [facility] Providers:  Organization         Address  Phone   Notes  [facility] [address], [place] [phone] Also accepts self-pay patients.  [facility] [address], [place]  [phone]) [phone]   [facility] [address], [place] [phone](336) [phone]   [facility] [address] [place] [address], [place] [phone](336) [phone]   Renaye RakersVeita Bland [address], [place]   [phone]) [phone] Only accepts [place] Access [place] patients after they have their name applied to their card.   Self-Pay (no insurance) in [facility]:  Organization         Address  Phone   Notes  Sickle Cell Patients, [facility] [address], [place] [phone]) [phone]   [facility] [address], [place] [phone]   [facility]  [address], [place] [phone](336) [phone]   [facility]/Dr. Osei-Bonsu  [address] [place] [address], [place] or [phone], [place] [phone](336) [phone] Phone number for both [place] and [place] locations is the same.  [facility] [address], [place] [phone]   [facility] [place] [address] [place] [address], [place] or [address] [phone]) [phone]   [facility] [address], [place] [phone](336) [phone], phone; [phone]) [phone], fax Sees patients 1st and 3rd Saturday of every month.  Must not qualify for public or private insurance (i.e. Medicaid, Medicare, [place] Health Choice, Veterans' Benefits)  Household income should be no more than 200% of the poverty level The clinic cannot treat you if you are pregnant or think you are pregnant  Sexually transmitted diseases are not treated at the clinic.    Dental Care: Organization         Address  Phone  Notes  [facility]  Department of [facility] [address], [place] [phone]) [phone] Accepts children up to age 23 who are enrolled in [place] or  Health Choice; pregnant women with a Medicaid card; and children who have applied for Medicaid or  Health Choice, but were declined, whose parents can pay a reduced fee at time of service.  [facility] Department of [facility] [place]  [address], [place] [phone]) [phone] Accepts children up  to age 5 who are enrolled in Medicaid or [place] Health Choice; pregnant women with a Medicaid card; and children who have applied for Medicaid or [place] Health Choice, but were declined, whose parents can pay a reduced fee at time of service.  [facility]  [address] [place], [place] [phone] Patients are seen by appointment only. Walk-ins are not accepted. [facility] will see patients 96 years of age and older. Monday - Tuesday (8am-5pm) Most Wednesdays (8:30-5pm) $30 per visit, cash only  [facility] Adult Dental Access PROGRAM  [address], [facility] [phone] Patients are seen by appointment only. Walk-ins are not accepted. [facility] will see patients 85 years of age and older. One Wednesday Evening (Monthly: Volunteer Based).  $30 per visit, cash only  [employer]  [phone] for adults; Children under age 86, call Graduate Pediatric Dentistry at [phone]. Children aged 67-14, please call [phone] to request a pediatric application.  Dental services are provided in all areas of dental care including fillings, crowns and bridges, complete and partial dentures, implants, gum treatment, root canals, and extractions. Preventive care is also provided. Treatment is provided to both adults and children. Patients are selected via a lottery and there is often a waiting list.   [facility] [address] [place]  [phone]  [email or web address]   [facility] [address] [place], [place] [phone], Ext. [phone] Second and Fourth Thursday of each month, opens at 6:30 AM; Clinic ends at 9 AM.  Patients are seen on a first-come first-served basis, and a limited number are seen during each clinic.   [facility]  [address] Ether Griffins [place], [place] [phone]   Eligibility Requirements You must have lived in [place], [place], or [place] counties for at least the last three months.   You cannot be eligible for state or federal sponsored [place], including [employer], [place], or [employer].   You generally cannot be eligible for healthcare insurance through your employer.    How to apply: Eligibility screenings are held every Tuesday and Wednesday afternoon from 1:00 pm until 4:00 pm. You do not need an appointment for the interview!  [facility] [address], [place], [place] [phone]   [facility] Health Department  [phone]   [facility] Health Department  [phone]   [facility] Health Department  [phone]    Behavioral Health Resources in the Community: Intensive Outpatient Programs Organization         Address  Phone  Notes  [facility] [address] [place], [place] [phone]   [facility] [address], [place], [place] [phone]   [facility] [address], [place], [place]  [phone]   [facility] Mental [facility] [address]  [place], [place] [phone] or [phone]   Substance Abuse Resources Organization         Address  Phone  Notes  [facility] Drug Services  [phone]   [facility]  [phone]   The [place]  [phone]   Floydene Flock  [phone]   Residential & Outpatient Substance Abuse Program  [phone]   Psychological Services Organization          Address  Phone  Notes  [facility] Behavioral Health  [phone]   [place] Services  [phone]   [facility]  [address], [place] [phone] or 6198792645    Mobile Crisis Teams Organization         Address  Phone  Notes  [facility], Mobile Crisis Care Unit  [phone]   [facility]  [address] [place], [place] [phone]   Doristine Locks [address] [place] [phone]    Self-Help/Support Groups Organization         Address  Phone             Notes  [facility] [place] - variety of support groups  336- I7437963 Call for more information  Narcotics Anonymous (NA), [facility] [address], [employer] [place]  2 meetings at this location   Statistician         Address  Phone  Notes  [facility] [address],    [place]  [phone]   [facility]  [address], [place] [address], [place], [place] [phone]   [facility] [address] [place], [place] [phone] Admissions: 8am-3pm M-F  [facility] [address]    [place], [place] [phone]   [facility] [address] [employer], [place], [place] [phone]   [facility] [address]  [place], [place] [phone]   [facility] - Intensive Outpatient [address], [place], [place] [phone]   [facility] ([facility] [address]  [place], [place] [phone] or [phone]   [facility] ([facility]) [address] [place], [place] [phone] Accepts Medicaid  [facility] [place] [address],  [place] [phone] Substance Abuse/Addiction Treatment   [facility] Organization         Address  Phone  Notes  [facility]  [phone]   Angie Fava, PhD [address] [place], [place]   [phone] or [phone]   [facility] Behavioral   [address] [place], [place] [phone]   [facility] [address] [place], [place] [phone] Insurance/Medicaid/sponsorship through [facility] and Families [address]                                    [place], [place] [phone] Therapy/tele-psych/case  [facility] [address], [place] [phone]    Dr. Lolly Mustache  [phone]   [facility]  [employer] [facility] Dept. 1) [address] [place] 2) [address] [place] 3)  [address], [place] [phone]  [phone]   [facility] Child Abuse Hotline [phone])[phone] or [phone] (After Hours)

## 2015-03-29 NOTE — ED Notes (Signed)
Per pt, states cold symptoms since that Wednesday-states body aches, head, nasal congestion-symptoms not getting better-states she wants something for pain

## 2015-03-29 NOTE — ED Provider Notes (Signed)
CSN: [id number]     Arrival date & time 03/29/15  4098 History   First MD Initiated Contact with Patient 03/29/15 1020     Chief Complaint  Patient presents with  . URI    HPI   [name] is a 24 y.o. female with a PMH of sick cell trait and gastric ulcer who presents to the ED with nasal congestion, cough productive of yellow sputum, and sore throat, which she states started earlier this week and has progressively worsened since that time. She reports she was evaluated in the ED 12/1, was told she likely had a viral illness, and was subsequently discharged. She states swallowing exacerbates her pain. She has tried taking tylenol at home with no significant symptom relief (states she has been taking 1 tylenol daily). She reports associated fever to 103 as well as vomiting when she tries to eat, however denies nausea or abdominal pain.    Past Medical History  Diagnosis Date  . Hypoglycemia   . Ovarian cyst 2011  . Constipation   . Sickle cell trait (HCC)   . History of dysmenorrhea   . Gastric ulcer    Past Surgical History  Procedure Laterality Date  . Laparoscopic ovarian cystectomy  2011  . Eye surgery      2004  . Laparotomy N/A 06/16/2012    Procedure: EXPLORATORY LAPAROTOMY;  Surgeon: Emelia Loron, MD;  Location: [facility] OR;  Service: General;  Laterality: N/A;  Repair of duodenal ulcer  . Repair of perforated ulcer N/A 06/16/2012    Procedure: REPAIR OF PERFORATED ULCER;  Surgeon: Emelia Loron, MD;  Location: [facility] OR;  Service: General;  Laterality: N/A;  duodenal   Family History  Problem Relation Age of Onset  . Anesthesia problems Neg Hx   . Hearing loss Neg Hx   . Breast cancer Paternal Grandmother   . Brain cancer     Social History  Substance Use Topics  . Smoking status: Never Smoker   . Smokeless tobacco: Never Used  . Alcohol Use: No   OB History    Gravida Para Term Preterm AB TAB SAB Ectopic Multiple Living   0 0 0 0 0 0 1       Review of Systems  Constitutional: Positive for fever and fatigue. Negative for chills.  HENT: Positive for congestion and sore throat. Negative for trouble swallowing.   Respiratory: Negative for shortness of breath.   Cardiovascular: Negative for chest pain.  Gastrointestinal: Positive for vomiting. Negative for nausea and abdominal pain.      Allergies  Nsaids  Home Medications   Prior to Admission medications   Medication Sig Start Date End Date Taking? Authorizing Provider  acetaminophen (TYLENOL) 500 MG tablet Take 1 tablet (500 mg total) by mouth every 6 (six) hours as needed. 03/29/15   Mady Gemma, PA-C  metoCLOPramide (REGLAN) 10 MG tablet Take 1 tablet (10 mg total) by mouth 2 (two) times daily at 8 am and 10 pm. Patient not taking: Reported on 03/29/2015 03/08/15 03/07/16  Lawson Fiscal A Clemmons, CNM  ondansetron (ZOFRAN ODT) 4 MG disintegrating tablet Take 1 tablet (4 mg total) by mouth every 8 (eight) hours as needed for nausea. Patient not taking: Reported on 03/29/2015 03/27/15   Garlon Hatchet, PA-C  penicillin v potassium (VEETID) 500 MG tablet Take 1 tablet (500 mg total) by mouth 2 (two) times daily. 03/29/15   Mady Gemma, PA-C  predniSONE (DELTASONE) 20 MG tablet  Take 2 tablets (40 mg total) by mouth daily. 03/29/15   Mady Gemma, PA-C  promethazine (PHENERGAN) 25 MG tablet Take 1 tablet (25 mg total) by mouth every 6 (six) hours as needed for nausea or vomiting. Patient not taking: Reported on 03/08/2015 12/30/14   Tilden Fossa, MD    BP 138/98 mmHg  Pulse 101  Temp(Src) 98.5 F (36.9 C) (Oral)  Resp 16  SpO2 98%  LMP  (LMP Unknown) Physical Exam  Constitutional: She is oriented to person, place, and time. She appears well-developed and well-nourished. No distress.  HENT:  Head: Normocephalic and atraumatic.  Right Ear: Hearing, tympanic membrane, external ear and ear canal normal.  Left Ear: Hearing, tympanic membrane, external ear  and ear canal normal.  Nose: Nose normal. Right sinus exhibits no maxillary sinus tenderness and no frontal sinus tenderness. Left sinus exhibits no maxillary sinus tenderness and no frontal sinus tenderness.  Mouth/Throat: Uvula is midline and mucous membranes are normal. Posterior oropharyngeal edema and posterior oropharyngeal erythema present. No oropharyngeal exudate or tonsillar abscesses.  Mild bilateral tonsillar hypertrophy with associated erythema. No tonsillar abscess. 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  Eyes: Conjunctivae and EOM are normal. Pupils are equal, round, and reactive to light. Right eye exhibits no discharge. Left eye exhibits no discharge. No scleral icterus.  Neck: Normal range of motion. Neck supple.  Cardiovascular: Normal rate, regular rhythm, normal heart sounds and intact distal pulses.   Pulmonary/Chest: Effort normal and breath sounds normal. No respiratory distress. She has no wheezes. She has no rales.  Abdominal: Soft. Bowel sounds are normal. She exhibits no distension and no mass. There is no tenderness. There is no rebound and no guarding.  Musculoskeletal: Normal range of motion. She exhibits no edema or tenderness.  Lymphadenopathy:    She has cervical adenopathy.  Neurological: She is alert and oriented to person, place, and time.  Skin: Skin is warm and dry. She is not diaphoretic.  Psychiatric: She has a normal mood and affect. Her behavior is normal.  Nursing note and vitals reviewed.   ED Course  Procedures (including critical care time)  Labs Review Labs Reviewed  RAPID STREP SCREEN (NOT AT [facility]) - Abnormal; Notable for the following:    Streptococcus, Group A Screen (Direct) POSITIVE (*)    All other components within normal limits    Imaging Review Dg Chest 2 View  03/29/2015  CLINICAL DATA:  Cough, congestion and fever for 5 days. EXAM: CHEST  2 VIEW COMPARISON:  CT and  radiographs 09/08/2014. FINDINGS: The heart size and mediastinal contours are normal. The lungs are clear. There is no pleural effusion or pneumothorax. No acute osseous findings are identified. IMPRESSION: Stable chest.  No active cardiopulmonary process. Electronically Signed   By: Carey Bullocks M.D.   On: 03/29/2015 11:18     I have personally reviewed and evaluated these images and lab results as part of my medical decision-making.   EKG Interpretation None      MDM   Final diagnoses:  Strep throat    24 year old female presents with nasal congestion, productive cough, and sore throat, which started earlier this week. Reports fever to 103 at home as well as vomiting. Denies nausea, abdominal pain. Patient is afebrile. Mildly tachycardic. On exam, patient has bilateral tonsillar hypertrophy with associated erythema and bilateral cervical lymphadenopathy. Heart regular rhythm. Lungs clear to auscultation bilaterally. Abdomen soft, non-tender, non-distended.  Will obtain rapid strep and CXR. Patient given tylenol and prednisone in the ED.  Rapid strep positive. CXR negative for active cardiopulmonary disease.  Patient is non-toxic appearing, HR improved, feel she is stable for discharge at this time. Will treat strep with PCN and give short course of steroids for inflammation. Patient to follow-up with PCP. Return precautions discussed. Patient verbalizes her understanding and is in agreement with plan.  BP 138/98 mmHg  Pulse 101  Temp(Src) 98.5 F (36.9 C) (Oral)  Resp 16  SpO2 98%  LMP  (LMP Unknown)    Mady Gemmalizabeth C Westfall, PA-C 03/29/15 1159  Richardean Canalavid H Yao, MD 03/29/15 [phone]

## 2015-05-15 ENCOUNTER — Encounter (HOSPITAL_COMMUNITY): Payer: Self-pay | Admitting: *Deleted

## 2015-05-15 ENCOUNTER — Emergency Department (HOSPITAL_COMMUNITY): Payer: Medicaid Other

## 2015-05-15 ENCOUNTER — Emergency Department (HOSPITAL_COMMUNITY)
Admission: EM | Admit: 2015-05-15 | Discharge: 2015-05-15 | Disposition: A | Discharge status: Home / Self Care | Payer: Self-pay | Attending: Emergency Medicine | Admitting: Emergency Medicine

## 2015-05-15 DIAGNOSIS — Z8719 Personal history of other diseases of the digestive system: Secondary | ICD-10-CM | POA: Insufficient documentation

## 2015-05-15 DIAGNOSIS — Z8639 Personal history of other endocrine, nutritional and metabolic disease: Secondary | ICD-10-CM | POA: Insufficient documentation

## 2015-05-15 DIAGNOSIS — R51 Headache: Secondary | ICD-10-CM | POA: Insufficient documentation

## 2015-05-15 DIAGNOSIS — Z792 Long term (current) use of antibiotics: Secondary | ICD-10-CM | POA: Insufficient documentation

## 2015-05-15 DIAGNOSIS — H53149 Visual discomfort, unspecified: Secondary | ICD-10-CM | POA: Insufficient documentation

## 2015-05-15 DIAGNOSIS — Z862 Personal history of diseases of the blood and blood-forming organs and certain disorders involving the immune mechanism: Secondary | ICD-10-CM | POA: Insufficient documentation

## 2015-05-15 DIAGNOSIS — H9209 Otalgia, unspecified ear: Secondary | ICD-10-CM | POA: Insufficient documentation

## 2015-05-15 DIAGNOSIS — Z7952 Long term (current) use of systemic steroids: Secondary | ICD-10-CM | POA: Insufficient documentation

## 2015-05-15 DIAGNOSIS — R519 Headache, unspecified: Secondary | ICD-10-CM

## 2015-05-15 DIAGNOSIS — Z8742 Personal history of other diseases of the female genital tract: Secondary | ICD-10-CM | POA: Insufficient documentation

## 2015-05-15 MED ORDER — SODIUM CHLORIDE 0.9 % IV BOLUS (SEPSIS)
1000.0000 mL | Freq: Once | INTRAVENOUS | Status: AC
  Administered 2015-05-15: 1000 mL via INTRAVENOUS

## 2015-05-15 MED ORDER — ONDANSETRON HCL 4 MG PO TABS: 4.0000 mg | ORAL_TABLET | Freq: Four times a day (QID) | ORAL | Status: DC

## 2015-05-15 MED ORDER — DIPHENHYDRAMINE HCL 50 MG/ML IJ SOLN
25.0000 mg | Freq: Once | INTRAMUSCULAR | Status: AC
  Administered 2015-05-15: 25 mg via INTRAVENOUS
  Filled 2015-05-15: qty 1

## 2015-05-15 MED ORDER — DEXAMETHASONE SODIUM PHOSPHATE 10 MG/ML IJ SOLN
10.0000 mg | Freq: Once | INTRAMUSCULAR | Status: AC
  Administered 2015-05-15: 10 mg via INTRAVENOUS
  Filled 2015-05-15: qty 1

## 2015-05-15 MED ORDER — METOCLOPRAMIDE HCL 5 MG/ML IJ SOLN
10.0000 mg | Freq: Once | INTRAMUSCULAR | Status: AC
  Administered 2015-05-15: 10 mg via INTRAVENOUS
  Filled 2015-05-15: qty 2

## 2015-05-15 NOTE — ED Notes (Signed)
Pt stable, ambulatory, states understanding of discharge instructions 

## 2015-05-15 NOTE — ED Notes (Signed)
Patient presents earful with c/o pain to the back of her head.  She is able to pinpoint the area.  States she does not have a history of headaches

## 2015-05-15 NOTE — Discharge Instructions (Signed)
Keep your scheduled follow up appointment with your PCP.   General Headache Without Cause A headache is pain or discomfort felt around the head or neck area. The specific cause of a headache may not be found. There are many causes and types of headaches. A few common ones are:  Tension headaches.  Migraine headaches.  Cluster headaches.  Chronic daily headaches. HOME CARE INSTRUCTIONS  Watch your condition for any changes. Take these steps to help with your condition: Managing Pain  Take over-the-counter and prescription medicines only as told by your health care provider.  Lie down in a dark, quiet room when you have a headache.  If directed, apply ice to the head and neck area:  Put ice in a plastic bag.  Place a towel between your skin and the bag.  Leave the ice on for 20 minutes, 2-3 times per day.  Use a heating pad or hot shower to apply heat to the head and neck area as told by your health care provider.  Keep lights dim if bright lights bother you or make your headaches worse. Eating and Drinking  Eat meals on a regular schedule.  Limit alcohol use.  Decrease the amount of caffeine you drink, or stop drinking caffeine. General Instructions  Keep all follow-up visits as told by your health care provider. This is important.  Keep a headache journal to help find out what may trigger your headaches. For example, write down:  What you eat and drink.  How much sleep you get.  Any change to your diet or medicines.  Try massage or other relaxation techniques.  Limit stress.  Sit up straight, and do not tense your muscles.  Do not use tobacco products, including cigarettes, chewing tobacco, or e-cigarettes. If you need help quitting, ask your health care provider.  Exercise regularly as told by your health care provider.  Sleep on a regular schedule. Get 7-9 hours of sleep, or the amount recommended by your health care provider. SEEK MEDICAL CARE IF:    Your symptoms are not helped by medicine.  You have a headache that is different from the usual headache.  You have nausea or you vomit.  You have a fever. SEEK IMMEDIATE MEDICAL CARE IF:   Your headache becomes severe.  You have repeated vomiting.  You have a stiff neck.  You have a loss of vision.  You have problems with speech.  You have pain in the eye or ear.  You have muscular weakness or loss of muscle control.  You lose your balance or have trouble walking.  You feel faint or pass out.  You have confusion.   This information is not intended to replace advice given to you by your health care provider. Make sure you discuss any questions you have with your health care provider.   Document Released: 04/12/2005 Document Revised: 01/01/2015 Document Reviewed: 08/05/2014 Elsevier Interactive Patient Education [employer]

## 2015-05-15 NOTE — ED Provider Notes (Signed)
History  By signing my name below, I, Karle Plumber, attest that this documentation has been prepared under the direction and in the presence of Raelle Chambers, PA-C. Electronically Signed: Karle Plumber, ED Scribe. 05/15/2015. 9:55 PM  Chief Complaint  Patient presents with  . Headache   The history is provided by the patient and medical records. No language interpreter was used.    HPI Comments:  [name] is a 25 y.o. female who presents to the Emergency Department complaining of sudden onset posterior head pain that began 3 days ago while washing dishes. She states the pain radiates to her right ear. She reports associated blurred vision and photophobia. She describes the pain as throbbing, sharp and pressure in the one spot of her posterior head. She has been taking Tylenol with no significant relief of the pain. The pain has been constant since onset. Touching the area, walking and movements intensify the pain. She denies alleviating factors. She denies LOC, neck pain, neck stiffness, nausea, vomiting, sore throat, rhinorrhea, numbness, tingling or weakness of any extremity, difficulty ambulating, fever or chills. She denies personal or family h/o migraines. She denies any trauma, injury or fall.  Past Medical History  Diagnosis Date  . Hypoglycemia   . Ovarian cyst 2011  . Constipation   . Sickle cell trait (HCC)   . History of dysmenorrhea   . Gastric ulcer    Past Surgical History  Procedure Laterality Date  . Laparoscopic ovarian cystectomy  2011  . Eye surgery      2004  . Laparotomy N/A 06/16/2012    Procedure: EXPLORATORY LAPAROTOMY;  Surgeon: Emelia Loron, MD;  Location: [facility] OR;  Service: General;  Laterality: N/A;  Repair of duodenal ulcer  . Repair of perforated ulcer N/A 06/16/2012    Procedure: REPAIR OF PERFORATED ULCER;  Surgeon: Emelia Loron, MD;  Location: [facility] OR;  Service: General;  Laterality: N/A;  duodenal   Family History  Problem  Relation Age of Onset  . Anesthesia problems Neg Hx   . Hearing loss Neg Hx   . Breast cancer Paternal Grandmother   . Brain cancer     Social History  Substance Use Topics  . Smoking status: Never Smoker   . Smokeless tobacco: Never Used  . Alcohol Use: No   OB History    Gravida Para Term Preterm AB TAB SAB Ectopic Multiple Living   0 0 0 0 0 0 1     Review of Systems  Constitutional: Negative for fever and chills.  HENT: Positive for ear pain. Negative for ear discharge.   Eyes: Positive for photophobia and visual disturbance.  Respiratory: Negative for cough and shortness of breath.   Cardiovascular: Negative for chest pain.  Gastrointestinal: Negative for nausea, vomiting and abdominal pain.  Musculoskeletal: Negative for gait problem, neck pain and neck stiffness.  Skin: Negative for rash.  Neurological: Positive for headaches. Negative for dizziness, syncope, facial asymmetry, speech difficulty, weakness and numbness.  All other systems reviewed and are negative.   Allergies  Nsaids  Home Medications   Prior to Admission medications   Medication Sig Start Date End Date Taking? Authorizing Provider  acetaminophen (TYLENOL) 500 MG tablet Take 1 tablet (500 mg total) by mouth every 6 (six) hours as needed. 03/29/15   Mady Gemma, PA-C  metoCLOPramide (REGLAN) 10 MG tablet Take 1 tablet (10 mg total) by mouth 2 (two) times daily at 8 am and 10 pm. Patient not taking:  Reported on 03/29/2015 03/08/15 03/07/16  Lawson Fiscal A Clemmons, CNM  ondansetron (ZOFRAN ODT) 4 MG disintegrating tablet Take 1 tablet (4 mg total) by mouth every 8 (eight) hours as needed for nausea. Patient not taking: Reported on 03/29/2015 03/27/15   Garlon Hatchet, PA-C  ondansetron (ZOFRAN) 4 MG tablet Take 1 tablet (4 mg total) by mouth every 6 (six) hours. 05/15/15   Sunya Humbarger, PA-C  penicillin v potassium (VEETID) 500 MG tablet Take 1 tablet (500 mg total) by mouth 2 (two) times daily.  03/29/15   Mady Gemma, PA-C  predniSONE (DELTASONE) 20 MG tablet Take 2 tablets (40 mg total) by mouth daily. 03/29/15   Mady Gemma, PA-C  promethazine (PHENERGAN) 25 MG tablet Take 1 tablet (25 mg total) by mouth every 6 (six) hours as needed for nausea or vomiting. Patient not taking: Reported on 03/08/2015 12/30/14   Tilden Fossa, MD   Triage Vitals: BP 127/81 mmHg  Pulse 92  Temp(Src) 97.4 F (36.3 C) (Oral)  Resp 18  Ht  (1.6 m)  Wt 167 lb (75.751 kg)  BMI 29.59 kg/m2  SpO2 98%  LMP 05/09/2015 Physical Exam  Constitutional: She is oriented to person, place, and time. She appears well-developed and well-nourished. No distress.  HENT:  Head: Normocephalic and atraumatic.    Right Ear: Tympanic membrane, external ear and ear canal normal.  Left Ear: Tympanic membrane, external ear and ear canal normal.  Mouth/Throat: Oropharynx is clear and moist. No oropharyngeal exudate.  Pain worsened by palpation of left occiput. No swelling, erythema, bony deformities, fluctuance, induration, skin breakdown or other skin changes noted  Eyes: Conjunctivae and EOM are normal. Pupils are equal, round, and reactive to light. Right eye exhibits no discharge. Left eye exhibits no discharge. No scleral icterus.  Neck: Normal range of motion. Neck supple.  Cardiovascular: Normal rate, regular rhythm and normal heart sounds.   Pulmonary/Chest: Effort normal and breath sounds normal. No respiratory distress. She has no wheezes. She has no rales.  Abdominal: Soft. There is no tenderness.  Musculoskeletal: Normal range of motion.  Moves all extremities spontaneously  Neurological: She is alert and oriented to person, place, and time. No cranial nerve deficit. Coordination normal.  Cranial nerves 3-12 intact. Major muscle groups with 5/5 motor strength. Sensation to light touch intact. Coordinated finger to nose. Walks with a steady gait.   Skin: Skin is warm and dry.  Psychiatric:  She has a normal mood and affect. Her behavior is normal.  Nursing note and vitals reviewed.   ED Course  Procedures (including critical care time) DIAGNOSTIC STUDIES: Oxygen Saturation is 98% on RA, normal by my interpretation.   COORDINATION OF CARE: 7:41 PM- Will order CT scan, Decadron, Reglan and Benadryl. Pt verbalizes understanding and agrees to plan.  Medications  sodium chloride 0.9 % bolus 1,000 mL (0 mLs Intravenous Stopped 05/15/15 2143)  dexamethasone (DECADRON) injection 10 mg (10 mg Intravenous Given 05/15/15 2014)  metoCLOPramide (REGLAN) injection 10 mg (10 mg Intravenous Given 05/15/15 2015)  diphenhydrAMINE (BENADRYL) injection 25 mg (25 mg Intravenous Given 05/15/15 2015)   Imaging Review Ct Head Wo Contrast  05/15/2015  CLINICAL DATA:  25 year old female with posterior headache for the past 3 days. EXAM: CT HEAD WITHOUT CONTRAST TECHNIQUE: Contiguous axial images were obtained from the base of the skull through the vertex without intravenous contrast. COMPARISON:  Head CTA 01/17/2008. FINDINGS: No acute intracranial abnormalities. Specifically, no evidence of acute intracranial hemorrhage, no definite findings  of acute/subacute cerebral ischemia, no mass, mass effect, hydrocephalus or abnormal intra or extra-axial fluid collections. Visualized paranasal sinuses and mastoids are well pneumatized. No acute displaced skull fractures are identified. IMPRESSION: *No acute intracranial abnormalities. *The appearance of the brain is normal. Electronically Signed   By: Trudie Reed M.D.   On: 05/15/2015 21:36   I have personally reviewed and evaluated these images and lab results as part of my medical decision-making.   Visual Acuity  Right Eye Distance: 20/25 Left Eye Distance: 20/25 Bilateral Distance: 20/25   MDM   Final diagnoses:  Headache, unspecified headache type   25 year old female presenting with headache 3 days. Associated symptoms include ear pain,  blurred vision and photophobia. No headache history. Vital signs stable. Nonfocal neuro exam. Pain is reproducible by palpation to the left occiput. No skin changes noted. Treated with IV fluids, Reglan, Benadryl and Decadron in the emergency department. CT head negative for acute findings. She reports full resolution of headache after receiving her medications. Presentation migraine versus tension headache. Patient has a scheduled appointment with her PCP in 6 days. Encouraged patient to discuss her headaches with her PCP.  At this time there does not appear to be any evidence of an acute emergency medical condition and the patient appears stable for discharge with appropriate outpatient follow up. Diagnosis was discussed with patient who verbalizes understanding and is agreeable to discharge. Return precautions given in discharge paperwork and discussed with pt at bedside. Pt is stable for discharge.   I personally performed the services described in this documentation, which was scribed in my presence. The recorded information has been reviewed and is accurate.    Rolm Gala Guilianna Mckoy, PA-C 05/15/15 2231  Azalia Bilis, MD 05/16/15 [phone]

## 2015-06-26 ENCOUNTER — Encounter (HOSPITAL_COMMUNITY): Payer: Self-pay | Admitting: *Deleted

## 2015-06-26 ENCOUNTER — Inpatient Hospital Stay (HOSPITAL_COMMUNITY)
Admission: AD | Admit: 2015-06-26 | Discharge: 2015-06-26 | Disposition: A | Discharge status: Home / Self Care | Payer: Medicaid Other | Source: Ambulatory Visit | Attending: Obstetrics & Gynecology | Admitting: Obstetrics & Gynecology

## 2015-06-26 DIAGNOSIS — Z886 Allergy status to analgesic agent status: Secondary | ICD-10-CM | POA: Insufficient documentation

## 2015-06-26 DIAGNOSIS — B9689 Other specified bacterial agents as the cause of diseases classified elsewhere: Secondary | ICD-10-CM

## 2015-06-26 DIAGNOSIS — R102 Pelvic and perineal pain: Secondary | ICD-10-CM

## 2015-06-26 DIAGNOSIS — A499 Bacterial infection, unspecified: Secondary | ICD-10-CM

## 2015-06-26 DIAGNOSIS — N760 Acute vaginitis: Secondary | ICD-10-CM

## 2015-06-26 DIAGNOSIS — Z3202 Encounter for pregnancy test, result negative: Secondary | ICD-10-CM | POA: Insufficient documentation

## 2015-06-26 LAB — URINALYSIS, ROUTINE W REFLEX MICROSCOPIC
BILIRUBIN URINE: NEGATIVE
Glucose, UA: NEGATIVE mg/dL
Hgb urine dipstick: NEGATIVE
KETONES UR: NEGATIVE mg/dL
NITRITE: NEGATIVE
PH: 5.5 (ref 5.0–8.0)
Protein, ur: NEGATIVE mg/dL
SPECIFIC GRAVITY, URINE: 1.02 (ref 1.005–1.030)

## 2015-06-26 LAB — CBC
HCT: 37.9 % (ref 36.0–46.0)
Hemoglobin: 13.1 g/dL (ref 12.0–15.0)
MCH: 27.6 pg (ref 26.0–34.0)
MCHC: 34.6 g/dL (ref 30.0–36.0)
MCV: 80 fL (ref 78.0–100.0)
Platelets: 250 10*3/uL (ref 150–400)
RBC: 4.74 MIL/uL (ref 3.87–5.11)
RDW: 13.2 % (ref 11.5–15.5)
WBC: 7.8 10*3/uL (ref 4.0–10.5)

## 2015-06-26 LAB — WET PREP, GENITAL
Sperm: NONE SEEN
Trich, Wet Prep: NONE SEEN
Yeast Wet Prep HPF POC: NONE SEEN

## 2015-06-26 LAB — POCT PREGNANCY, URINE: PREG TEST UR: NEGATIVE

## 2015-06-26 LAB — URINE MICROSCOPIC-ADD ON

## 2015-06-26 MED ORDER — METRONIDAZOLE 500 MG PO TABS: 500.0000 mg | ORAL_TABLET | Freq: Two times a day (BID) | ORAL | Status: DC

## 2015-06-26 MED ORDER — OXYCODONE-ACETAMINOPHEN 5-325 MG PO TABS: 1.0000 | ORAL_TABLET | ORAL | Status: DC | PRN

## 2015-06-26 NOTE — Discharge Instructions (Signed)

## 2015-06-26 NOTE — MAU Note (Signed)
Pt C/O lower abd pain, worse on R x 2 weeks.  Has metal taste in her mouth.  Denies vag bleeding.

## 2015-06-26 NOTE — MAU Provider Note (Signed)
History     CSN: [id number]  Arrival date and time: 06/26/15 1505   First Provider Initiated Contact with Patient 06/26/15 1628      Chief Complaint  Patient presents with  . Abdominal Pain   HPI  [name] 25 y.o. G1P1001 presents with lower abdominal pain x 2 weeks. Denies abnormal bleeding and new sexual partner. Denies vaginal discharge. States pain is worse when she walks. Denies n/v, diarrhea.  Past Medical History  Diagnosis Date  . Hypoglycemia   . Ovarian cyst 2011  . Constipation   . Sickle cell trait (HCC)   . History of dysmenorrhea   . Gastric ulcer     Past Surgical History  Procedure Laterality Date  . Laparoscopic ovarian cystectomy  2011  . Eye surgery      2004  . Laparotomy N/A 06/16/2012    Procedure: EXPLORATORY LAPAROTOMY;  Surgeon: Emelia Loron, MD;  Location: [facility] OR;  Service: General;  Laterality: N/A;  Repair of duodenal ulcer  . Repair of perforated ulcer N/A 06/16/2012    Procedure: REPAIR OF PERFORATED ULCER;  Surgeon: Emelia Loron, MD;  Location: [facility] OR;  Service: General;  Laterality: N/A;  duodenal    Family History  Problem Relation Age of Onset  . Anesthesia problems Neg Hx   . Hearing loss Neg Hx   . Breast cancer Paternal Grandmother   . Brain cancer      Social History  Substance Use Topics  . Smoking status: Never Smoker   . Smokeless tobacco: Never Used  . Alcohol Use: No    Allergies:  Allergies  Allergen Reactions  . Nsaids     Diagnosed with a perforated duodenal ulcer due to NSAIDS    Prescriptions prior to admission  Medication Sig Dispense Refill Last Dose  . acetaminophen (TYLENOL) 500 MG tablet Take 1 tablet (500 mg total) by mouth every 6 (six) hours as needed. 30 tablet 0   . metoCLOPramide (REGLAN) 10 MG tablet Take 1 tablet (10 mg total) by mouth 2 (two) times daily at 8 am and 10 pm. (Patient not taking: Reported on 03/29/2015) 90 tablet 1   . ondansetron (ZOFRAN ODT) 4 MG disintegrating  tablet Take 1 tablet (4 mg total) by mouth every 8 (eight) hours as needed for nausea. (Patient not taking: Reported on 03/29/2015) 10 tablet 0   . ondansetron (ZOFRAN) 4 MG tablet Take 1 tablet (4 mg total) by mouth every 6 (six) hours. 12 tablet 0   . penicillin v potassium (VEETID) 500 MG tablet Take 1 tablet (500 mg total) by mouth 2 (two) times daily. 20 tablet 0   . predniSONE (DELTASONE) 20 MG tablet Take 2 tablets (40 mg total) by mouth daily. 10 tablet 0   . promethazine (PHENERGAN) 25 MG tablet Take 1 tablet (25 mg total) by mouth every 6 (six) hours as needed for nausea or vomiting. (Patient not taking: Reported on 03/08/2015) 10 tablet 0 Not Taking at Unknown time    Review of Systems  Constitutional: Negative for fever.  Gastrointestinal: Positive for abdominal pain. Negative for nausea, vomiting, diarrhea and constipation.  Genitourinary: Negative for urgency.  All other systems reviewed and are negative.  Physical Exam   Blood pressure 134/88, pulse 100, temperature 98.2 F (36.8 C), temperature source Oral, resp. rate 18, last menstrual period 06/08/2015.  Physical Exam  Nursing note and vitals reviewed. Constitutional: She is oriented to person, place, and time. She appears well-developed and well-nourished.  HENT:  Head: Normocephalic and atraumatic.  Cardiovascular: Normal rate and regular rhythm.   Respiratory: Effort normal and breath sounds normal. No respiratory distress.  GI: Soft. There is tenderness.  Lower abdominal tenderness   Genitourinary: Vagina normal. No vaginal discharge found.  Tenderness on bimanual exam; left and right. Bulbous cervix noted on palpation. No CMT  Musculoskeletal: Normal range of motion.  Neurological: She is alert and oriented to person, place, and time.  Skin: Skin is warm and dry.  Psychiatric: She has a normal mood and affect. Her behavior is normal. Judgment and thought content normal.   Results for orders placed or performed  during the hospital encounter of 06/26/15 (from the past 24 hour(s))  Urinalysis, Routine w reflex microscopic (not at [facility])     Status: Abnormal   Collection Time: 06/26/15  3:20 PM  Result Value Ref Range   Color, Urine YELLOW YELLOW   APPearance CLEAR CLEAR   Specific Gravity, Urine 1.020 1.005 - 1.030   pH 5.5 5.0 - 8.0   Glucose, UA NEGATIVE NEGATIVE mg/dL   Hgb urine dipstick NEGATIVE NEGATIVE   Bilirubin Urine NEGATIVE NEGATIVE   Ketones, ur NEGATIVE NEGATIVE mg/dL   Protein, ur NEGATIVE NEGATIVE mg/dL   Nitrite NEGATIVE NEGATIVE   Leukocytes, UA SMALL (A) NEGATIVE  Urine microscopic-add on     Status: Abnormal   Collection Time: 06/26/15  3:20 PM  Result Value Ref Range   Squamous Epithelial / LPF 0-5 (A) NONE SEEN   WBC, UA 0-5 0 - 5 WBC/hpf   RBC / HPF 0-5 0 - 5 RBC/hpf   Bacteria, UA RARE (A) NONE SEEN  Pregnancy, urine POC     Status: None   Collection Time: 06/26/15  3:47 PM  Result Value Ref Range   Preg Test, Ur NEGATIVE NEGATIVE  Wet prep, genital     Status: Abnormal   Collection Time: 06/26/15  4:14 PM  Result Value Ref Range   Yeast Wet Prep HPF POC NONE SEEN NONE SEEN   Trich, Wet Prep NONE SEEN NONE SEEN   Clue Cells Wet Prep HPF POC PRESENT (A) NONE SEEN   WBC, Wet Prep HPF POC FEW (A) NONE SEEN   Sperm NONE SEEN   CBC     Status: None   Collection Time: 06/26/15  4:46 PM  Result Value Ref Range   WBC 7.8 4.0 - 10.5 K/uL   RBC 4.74 3.87 - 5.11 MIL/uL   Hemoglobin 13.1 12.0 - 15.0 g/dL   HCT 16.1 09.6 - 04.5 %   MCV 80.0 78.0 - 100.0 fL   MCH 27.6 26.0 - 34.0 pg   MCHC 34.6 30.0 - 36.0 g/dL   RDW 40.9 81.1 - 91.4 %   Platelets 250 150 - 400 K/uL   MAU Course  Procedures  MDM Pending wet prep-BV present; Will schedule outpatient U/S to evaluate for ovarian cyst. Will need to follow up in clinic for evaluation. Will advise tylenol since allergic to nsaids. Will give percocet 5/325 #10 NR, flagyl  BID x 7 d. Message sent to clinic to follow up  with this patient for gyn appointment  Assessment and Plan  Pelvic pain Bacterial vaginosis Percocet 5/325 #10 Flagyl  Bid x 7d Follow up with Clinic Discharge    [facility] 06/26/2015, 4:35 PM

## 2015-06-27 LAB — GC/CHLAMYDIA PROBE AMP (~~LOC~~) NOT AT ARMC
Chlamydia: NEGATIVE
Neisseria Gonorrhea: NEGATIVE

## 2015-07-02 ENCOUNTER — Telehealth: Payer: Self-pay | Admitting: *Deleted

## 2015-07-02 DIAGNOSIS — R102 Pelvic and perineal pain: Secondary | ICD-10-CM

## 2015-07-02 NOTE — Telephone Encounter (Signed)
Called pt and advised her that I have scheduled the US appt on 07/08/15 @ 1400. She will need to arrive @ 1345 with a full bladder. She will be contacted soon with information about a follow up appt in our office to discuss the results of the ultrasound.  Pt voiced understanding.

## 2015-07-08 ENCOUNTER — Ambulatory Visit (HOSPITAL_COMMUNITY)
Admission: RE | Admit: 2015-07-08 | Discharge: 2015-07-08 | Disposition: A | Discharge status: Home / Self Care | Payer: Medicaid Other | Source: Ambulatory Visit | Attending: Certified Nurse Midwife | Admitting: Certified Nurse Midwife

## 2015-07-08 DIAGNOSIS — N760 Acute vaginitis: Secondary | ICD-10-CM

## 2015-07-08 DIAGNOSIS — R1031 Right lower quadrant pain: Secondary | ICD-10-CM | POA: Insufficient documentation

## 2015-07-08 DIAGNOSIS — B9689 Other specified bacterial agents as the cause of diseases classified elsewhere: Secondary | ICD-10-CM

## 2015-07-08 DIAGNOSIS — R102 Pelvic and perineal pain: Secondary | ICD-10-CM

## 2015-07-10 ENCOUNTER — Telehealth: Payer: Self-pay | Admitting: Family Medicine

## 2015-07-10 ENCOUNTER — Encounter: Payer: Medicaid Other | Admitting: Family Medicine

## 2015-07-10 NOTE — Telephone Encounter (Signed)
Called patient about missed appointment, and left message to call us back.

## 2015-10-02 ENCOUNTER — Encounter (HOSPITAL_COMMUNITY): Payer: Self-pay | Admitting: *Deleted

## 2015-10-02 ENCOUNTER — Emergency Department (HOSPITAL_COMMUNITY)
Admission: EM | Admit: 2015-10-02 | Discharge: 2015-10-02 | Disposition: A | Discharge status: Home / Self Care | Payer: Self-pay | Attending: Emergency Medicine | Admitting: Emergency Medicine

## 2015-10-02 DIAGNOSIS — K0381 Cracked tooth: Secondary | ICD-10-CM | POA: Insufficient documentation

## 2015-10-02 DIAGNOSIS — K029 Dental caries, unspecified: Secondary | ICD-10-CM | POA: Insufficient documentation

## 2015-10-02 MED ORDER — ONDANSETRON 4 MG PO TBDP
4.0000 mg | ORAL_TABLET | Freq: Once | ORAL | Status: AC
  Administered 2015-10-02: 4 mg via ORAL
  Filled 2015-10-02: qty 1

## 2015-10-02 MED ORDER — HYDROCODONE-ACETAMINOPHEN 5-325 MG PO TABS
2.0000 | ORAL_TABLET | Freq: Once | ORAL | Status: AC
  Administered 2015-10-02: 2 via ORAL
  Filled 2015-10-02: qty 2

## 2015-10-02 MED ORDER — HYDROCODONE-ACETAMINOPHEN 5-325 MG PO TABS: 1.0000 | ORAL_TABLET | ORAL | Status: DC | PRN

## 2015-10-02 MED ORDER — PENICILLIN V POTASSIUM 250 MG PO TABS
500.0000 mg | ORAL_TABLET | Freq: Once | ORAL | Status: AC
Start: 2015-10-02 — End: 2015-10-02
  Administered 2015-10-02: 500 mg via ORAL
  Filled 2015-10-02: qty 2

## 2015-10-02 MED ORDER — PENICILLIN V POTASSIUM 500 MG PO TABS: 500.0000 mg | ORAL_TABLET | Freq: Four times a day (QID) | ORAL | Status: DC

## 2015-10-02 MED ORDER — PROMETHAZINE HCL 25 MG PO TABS: 25.0000 mg | ORAL_TABLET | Freq: Four times a day (QID) | ORAL | Status: DC | PRN

## 2015-10-02 MED ORDER — BUPIVACAINE HCL (PF) 0.5 % IJ SOLN
10.0000 mL | Freq: Once | INTRAMUSCULAR | Status: AC
  Administered 2015-10-02: 10 mL
  Filled 2015-10-02: qty 10

## 2015-10-02 NOTE — ED Notes (Signed)
Patient states she is nauseous now and would like something for that before she leaves.

## 2015-10-02 NOTE — ED Notes (Signed)
The pt has a broken tooth and she has had a toothache for 10 dayus.  lmp may 30th

## 2015-10-02 NOTE — ED Notes (Signed)
Patient verbalized understanding of discharge instructions and denies any further needs or questions at this time. VS stable. Patient ambulatory with steady gait.  

## 2015-10-02 NOTE — ED Provider Notes (Signed)
By signing my name below, I, Marisue HumbleMichelle Chaffee, attest that this documentation has been prepared under the direction and in the presence of Aliyanna Wassmer N Binyomin Brann, DO . Electronically Signed: Marisue HumbleMichelle Chaffee, Scribe. 10/02/2015. 3:46 AM.  TIME SEEN: 3:44 AM   CHIEF COMPLAINT: Dental Problem  HPI: HPI Comments:  [name] is a 25 y.o. female with no pertinent PMHx who presents to the Emergency Department complaining of a painful broken tooth that fell out 2 nights ago. She reports severe right, rear, lower dental pain that shoots into her right ear. Her pain is causing difficulty sleeping; she requests a dental block. No fever or facial swelling. Pain with eating, drinking. No alleviating factors noted or treatments attempted PTA. Pt requests referral to dentist.   ROS: See HPI Constitutional: no fever  Eyes: no drainage  ENT: no runny nose   Cardiovascular:  no chest pain  Resp: no SOB  GI: no vomiting GU: no dysuria Integumentary: no rash  Allergy: no hives  Musculoskeletal: no leg swelling  Neurological: no slurred speech ROS otherwise negative  PAST MEDICAL HISTORY/PAST SURGICAL HISTORY:  History reviewed. No pertinent past medical history.  MEDICATIONS:  Prior to Admission medications   Not on File    ALLERGIES:  Allergies  Allergen Reactions  . Nsaids     SOCIAL HISTORY:  Social History  Substance Use Topics  . Smoking status: Never Smoker   . Smokeless tobacco: Not on file  . Alcohol Use: No    FAMILY HISTORY: No family history on file.  EXAM: BP 122/87 mmHg  Pulse 88  Temp(Src) 98.7 F (37.1 C) (Oral)  Resp 16  SpO2 100%  LMP 09/23/2015 CONSTITUTIONAL: Alert and oriented and responds appropriately to questions. Well-appearing; well-nourished HEAD: Normocephalic;  EYES: Conjunctivae clear, PERRL ENT: normal nose; no rhinorrhea; moist mucous membranes; dental carrie and fracture to the right 3rd lower molar without obvious dentin exposure; no obvious  drainage or abscess; No pharyngeal erythema or petechiae, no tonsillar hypertrophy or exudate, no uvular deviation, no trismus or drooling, normal phonation, no stridor, no Ludwig's angina, tongue sits flat in the bottom of the mouth NECK: Supple, no meningismus, no LAD  CARD: RRR; S1 and S2 appreciated; no murmurs, no clicks, no rubs, no gallops RESP: Normal chest excursion without splinting or tachypnea; breath sounds clear and equal bilaterally; no wheezes, no rhonchi, no rales, no hypoxia or respiratory distress, speaking full sentences ABD/GI: Normal bowel sounds; non-distended; soft, non-tender, no rebound, no guarding, no peritoneal signs BACK:  The back appears normal and is non-tender to palpation, there is no CVA tenderness EXT: Normal ROM in all joints; non-tender to palpation; no edema; normal capillary refill; no cyanosis, no calf tenderness or swelling    SKIN: Normal color for age and race; warm; no rash NEURO: Moves all extremities equally, sensation to light touch intact diffusely, cranial nerves II through XII intact PSYCH: The patient's mood and manner are appropriate. Grooming and personal hygiene are appropriate.  MEDICAL DECISION MAKING: Patient here with fractured tooth without obvious dentin exposure. No drainable abscess. No Ludwig's angina. Will start her on prophylactic antibiotics. She does appear very uncomfortable. I do feel it is reasonable to send her home with short course of Vicodin for pain control. We have provided her with a dental block which has provided significant pain relief. We'll discharge with prescription for Vicodin, penicillin and outpatient follow-up. Discussed strict return precautions. She verbalized understanding and is covered with this plan.  At this time, I  do not feel there is any life-threatening condition present. I have reviewed and discussed all results (EKG, imaging, lab, urine as appropriate), exam findings with patient. I have reviewed  nursing notes and appropriate previous records.  I feel the patient is safe to be discharged home without further emergent workup. Discussed usual and customary return precautions. Patient and family (if present) verbalize understanding and are comfortable with this plan.  Patient will follow-up with their primary care provider. If they do not have a primary care provider, information for follow-up has been provided to them. All questions have been answered.    NERVE BLOCK Performed by: Raelyn Number Consent: Verbal consent obtained. Required items: required blood products, implants, devices, and special equipment available Time out: Immediately prior to procedure a "time out" was called to verify the correct patient, procedure, equipment, support staff and site/side marked as required.  Indication: Dental pain  Nerve block body site: Right inferior alveolar dental block, right buccal block  Preparation: Patient was prepped and draped in the usual sterile fashion. Needle gauge: 24 G Location technique: anatomical landmarks  Local anesthetic: Bupivacaine 0.5% without preservative   Anesthetic total: 8 ml  Outcome: pain improved Patient tolerance: Patient tolerated the procedure well with no immediate complications.    I personally performed the services described in this documentation, which was scribed in my presence. The recorded information has been reviewed and is accurate.    Layla Maw Ahria Slappey, DO 10/02/15 [phone]

## 2015-10-02 NOTE — Discharge Instructions (Signed)
Dental Care and Dentist Visits °Dental care supports good overall health. Regular dental visits can also help you avoid dental pain, bleeding, infection, and other more serious health problems in the future. It is important to keep the mouth healthy because diseases in the teeth, gums, and other oral tissues can spread to other areas of the body. Some problems, such as diabetes, heart disease, and pre-term labor have been associated with poor oral health.  °See your dentist every 6 months. If you experience emergency problems such as a toothache or broken tooth, go to the dentist right away. If you see your dentist regularly, you may catch problems early. It is easier to be treated for problems in the early stages.  °WHAT TO EXPECT AT A DENTIST VISIT  °Your dentist will look for many common oral health problems and recommend proper treatment. At your regular dental visit, you can expect: °· Gentle cleaning of the teeth and gums. This includes scraping and polishing. This helps to remove the sticky substance around the teeth and gums (plaque). Plaque forms in the mouth shortly after eating. Over time, plaque hardens on the teeth as tartar. If tartar is not removed regularly, it can cause problems. Cleaning also helps remove stains. °· Periodic X-rays. These pictures of the teeth and supporting bone will help your dentist assess the health of your teeth. °· Periodic fluoride treatments. Fluoride is a natural mineral shown to help strengthen teeth. Fluoride treatment involves applying a fluoride gel or varnish to the teeth. It is most commonly done in children. °· Examination of the mouth, tongue, jaws, teeth, and gums to look for any oral health problems, such as: °· Cavities (dental caries). This is decay on the tooth caused by plaque, sugar, and acid in the mouth. It is best to catch a cavity when it is small. °· Inflammation of the gums caused by plaque buildup (gingivitis). °· Problems with the mouth or malformed  or misaligned teeth. °· Oral cancer or other diseases of the soft tissues or jaws.  °KEEP YOUR TEETH AND GUMS HEALTHY °For healthy teeth and gums, follow these general guidelines as well as your dentist's specific advice: °· Have your teeth professionally cleaned at the dentist every 6 months. °· Brush twice daily with a fluoride toothpaste. °· Floss your teeth daily.  °· Ask your dentist if you need fluoride supplements, treatments, or fluoride toothpaste. °· Eat a healthy diet. Reduce foods and drinks with added sugar. °· Avoid smoking. °TREATMENT FOR ORAL HEALTH PROBLEMS °If you have oral health problems, treatment varies depending on the conditions present in your teeth and gums. °· Your caregiver will most likely recommend good oral hygiene at each visit. °· For cavities, gingivitis, or other oral health disease, your caregiver will perform a procedure to treat the problem. This is typically done at a separate appointment. Sometimes your caregiver will refer you to another dental specialist for specific tooth problems or for surgery. °SEEK IMMEDIATE DENTAL CARE IF: °· You have pain, bleeding, or soreness in the gum, tooth, jaw, or mouth area. °· A permanent tooth becomes loose or separated from the gum socket. °· You experience a blow or injury to the mouth or jaw area. °  °This information is not intended to replace advice given to you by your health care provider. Make sure you discuss any questions you have with your health care provider. °  °Document Released: 12/23/2010 Document Revised: 07/05/2011 Document Reviewed: 12/23/2010 °Elsevier Interactive Patient Education ©2016 Elsevier Inc. ° °Dental Caries °Dental   caries (also called tooth decay) is the most common oral disease. It can occur at any age but is more common in children and young adults.  °HOW DENTAL CARIES DEVELOPS  °The process of decay begins when bacteria and foods (particularly sugars and starches) combine in your mouth to produce plaque.  Plaque is a substance that sticks to the hard, outer surface of a tooth (enamel). The bacteria in plaque produce acids that attack enamel. These acids may also attack the root surface of a tooth (cementum) if it is exposed. Repeated attacks dissolve these surfaces and create holes in the tooth (cavities). If left untreated, the acids destroy the other layers of the tooth.  °RISK FACTORS °· Frequent sipping of sugary beverages.   °· Frequent snacking on sugary and starchy foods, especially those that easily get stuck in the teeth.   °· Poor oral hygiene.   °· Dry mouth.   °· Substance abuse such as methamphetamine abuse.   °· Broken or poor-fitting dental restorations.   °· Eating disorders.   °· Gastroesophageal reflux disease (GERD).   °· Certain radiation treatments to the head and neck. °SYMPTOMS °In the early stages of dental caries, symptoms are seldom present. Sometimes white, chalky areas may be seen on the enamel or other tooth layers. In later stages, symptoms may include: °· Pits and holes on the enamel. °· Toothache after sweet, hot, or cold foods or drinks are consumed. °· Pain around the tooth. °· Swelling around the tooth. °DIAGNOSIS  °Most of the time, dental caries is detected during a regular dental checkup. A diagnosis is made after a thorough medical and dental history is taken and the surfaces of your teeth are checked for signs of dental caries. Sometimes special instruments, such as lasers, are used to check for dental caries. Dental X-ray exams may be taken so that areas not visible to the eye (such as between the contact areas of the teeth) can be checked for cavities.  °TREATMENT  °If dental caries is in its early stages, it may be reversed with a fluoride treatment or an application of a remineralizing agent at the dental office. Thorough brushing and flossing at home is needed to aid these treatments. If it is in its later stages, treatment depends on the location and extent of tooth  destruction:  °· If a small area of the tooth has been destroyed, the destroyed area will be removed and cavities will be filled with a material such as gold, silver amalgam, or composite resin.   °· If a large area of the tooth has been destroyed, the destroyed area will be removed and a cap (crown) will be fitted over the remaining tooth structure.   °· If the center part of the tooth (pulp) is affected, a procedure called a root canal will be needed before a filling or crown can be placed.   °· If most of the tooth has been destroyed, the tooth may need to be pulled (extracted). °HOME CARE INSTRUCTIONS °You can prevent, stop, or reverse dental caries at home by practicing good oral hygiene. Good oral hygiene includes: °· Thoroughly cleaning your teeth at least twice a day with a toothbrush and dental floss.   °· Using a fluoride toothpaste. A fluoride mouth rinse may also be used if recommended by your dentist or health care provider.   °· Restricting the amount of sugary and starchy foods and sugary liquids you consume.   °· Avoiding frequent snacking on these foods and sipping of these liquids.   °· Keeping regular visits with a dentist for   checkups and cleanings. °PREVENTION  °· Practice good oral hygiene. °· Consider a dental sealant. A dental sealant is a coating material that is applied by your dentist to the pits and grooves of teeth. The sealant prevents food from being trapped in them. It may protect the teeth for several years. °· Ask about fluoride supplements if you live in a community without fluorinated water or with water that has a low fluoride content. Use fluoride supplements as directed by your dentist or health care provider. °· Allow fluoride varnish applications to teeth if directed by your dentist or health care provider. °  °This information is not intended to replace advice given to you by your health care provider. Make sure you discuss any questions you have with your health care  provider. °  °Document Released: 01/02/2002 Document Revised: 05/03/2014 Document Reviewed: 04/14/2012 °Elsevier Interactive Patient Education ©2016 Elsevier Inc. ° ° °Community Resource Guide Dental °The [employer]’s “211” is a great source of information about community services available.  Access by dialing 2-1-1 from anywhere in [place], or by website -  www.nc211.org.  ° °Other Local Resources (Updated 04/2015) ° °Dental  Care °  °Services ° °  °Phone Number and Address  °Cost  °[facility] For children 0 - 21 years of age:  °• Cleaning °• Tooth brushing/flossing instruction °• Sealants, fillings, crowns °• Extractions °• Emergency treatment  [phone] °[address] °[place], [place] [address] Charges based on family income.  Medicaid and some insurance plans accepted.   °  °[facility] [place] • Cleaning °• Sealants, fillings, crowns °• Extractions °• Emergency treatment [phone] °[address] °[place], [place] ° Pregnant women 18 years of age or older with a Medicaid card  °[facility] [place] • Cleaning °• Sealants, fillings, crowns °• Extractions °• Emergency treatment [phone] °[address] °[place], [place] Pregnant women 18 years of age or older with a Medicaid card  °[facility] For children 0 - 21 years of age:  °• Cleaning °• Tooth brushing/flossing instruction °• Sealants, fillings, crowns °• Extractions °• Emergency treatment °Limited orthodontic services for patients with Medicaid [phone] °[address] °[place], [place] [address] Medicaid and [place] Health Choice cover for children up to age 21 and pregnant women.  Parents of children up to age 21 without Medicaid pay a reduced fee at time of service.  °[facility] [place] For children 0 - 21 years of age:   °• Cleaning °• Tooth brushing/flossing instruction °• Sealants, fillings, crowns °• Extractions °• Emergency treatment °Limited orthodontic services for patients with Medicaid [phone] °[address] °[place], [place].  Medicaid and [place] Health Choice cover for children up to age 21 and pregnant women.  Parents of children up to age 21 without Medicaid pay a reduced fee.  °[facility] • Cleaning °• Sealants, fillings, crowns °• Extractions ° °Hours: Tuesdays and Thursdays, 4:15 - 8 pm [phone] °[address] °[place], [place] [address] Services free of charge to [place] residents ages 18-64 who do not have health insurance, Medicare, Medicaid, or VA benefits and fall within federal poverty guidelines  °[facility] ° ° ° Provides dental care in addition to primary medical care, nutritional counseling, and pharmacy: °• Cleaning °• Sealants, fillings, crowns °• Extractions ° ° ° ° ° ° ° ° ° ° ° ° ° ° ° ° °   [phone] °[facility], [address] °[place], [place] ° °[phone] °[facility], [address] [place], [place] ° °[phone] °[facility] °[place], [place] ° °[phone] °[facility], [address] °[place], [place] ° °[phone] °[facility] °[address] °[place], [place] Accepts Medicaid, Medicare, most insurance.  Also provides services available to all with fees adjusted based on ability to pay.    °[facility] • Cleaning °• Tooth brushing/flossing instruction °• Sealants, fillings, crowns °• Extractions °• Emergency treatment °Hours: Tuesdays, Thursdays, and Fridays from 8 am to 5 pm by appointment only. [phone] °[address] °[place], [place] [address] [place] residents with Medicaid (depending on eligibility) and children with [place] Health Choice - call for more  information.  °[facility] • Extractions only ° °Hours: 2nd and 4th Thursday of each month from 6:30 am - 9 am.   [phone] ext. [phone] °[address] °[place], [place] [address] Ages 18 and older only.  Patients are seen on a first come, first served basis.  °[facility] • Cleanings °• Fillings °• Extractions °• Orthodontics °• Endodontics °• Implants/Crowns/Bridges °• Complete and partial dentures [phone] °[place],  Patients must complete an application for services.  There is often a waiting list.   ° °

## 2015-10-09 ENCOUNTER — Encounter (HOSPITAL_COMMUNITY): Payer: Self-pay | Admitting: *Deleted

## 2015-10-15 ENCOUNTER — Emergency Department (HOSPITAL_COMMUNITY)
Admission: EM | Admit: 2015-10-15 | Discharge: 2015-10-15 | Disposition: A | Discharge status: Home / Self Care | Payer: Medicaid Other | Attending: Emergency Medicine | Admitting: Emergency Medicine

## 2015-10-15 ENCOUNTER — Encounter (HOSPITAL_COMMUNITY): Payer: Self-pay | Admitting: Nurse Practitioner

## 2015-10-15 DIAGNOSIS — K029 Dental caries, unspecified: Secondary | ICD-10-CM

## 2015-10-15 MED ORDER — ACETAMINOPHEN 500 MG PO TABS: 500.0000 mg | ORAL_TABLET | Freq: Four times a day (QID) | ORAL | Status: DC | PRN

## 2015-10-15 MED ORDER — BUPIVACAINE-EPINEPHRINE (PF) 0.5% -1:200000 IJ SOLN
1.8000 mL | Freq: Once | INTRAMUSCULAR | Status: AC
  Administered 2015-10-15: 1.8 mL
  Filled 2015-10-15: qty 1.8

## 2015-10-15 MED ORDER — PENICILLIN V POTASSIUM 500 MG PO TABS: 500.0000 mg | ORAL_TABLET | Freq: Four times a day (QID) | ORAL | Status: DC

## 2015-10-15 NOTE — ED Provider Notes (Signed)
CSN: [id number]     Arrival date & time 10/15/15  1742 History  By signing my name below, I, Soijett Blue, attest that this documentation has been prepared under the direction and in the presence of Fayrene HelperBowie Tykwon Fera, PA-C Electronically Signed: Soijett Blue, ED Scribe. 10/15/2015. 6:57 PM.  Chief Complaint  Patient presents with  . Dental Pain      The history is provided by the patient. No language interpreter was used.    [name] is a 25 y.o. female who presents to the Emergency Department complaining of constant, moderate, right lower dental pain onset 2 months. Pt reports that she is not able to see a dentist due to lack of insurance until 10/31/2015 when she obtains insurance. Pt reports that her right lower dental pain radiates to her right sided head. Pt notes that her dental pain is worsened with temperature change and talking. Denies alleviating factors. She states that she is having associated symptoms of right ear pain and subjective fever. She states that she has tried home remedies with no relief for her symptoms. She denies any other symptoms. Pt is allergic to NSAIDs.   Per pt chart review: Pt was seen in the ED on 10/02/2015 for right lower dental pain. Pt was given a dental block while in the ED. Pt was informed to follow up with dentist. Pt was Rx vicodin and penicillin for their symptoms.   Past Medical History  Diagnosis Date  . Hypoglycemia   . Ovarian cyst 2011  . Constipation   . Sickle cell trait (HCC)   . History of dysmenorrhea   . Gastric ulcer    Past Surgical History  Procedure Laterality Date  . Laparoscopic ovarian cystectomy  2011  . Eye surgery      2004  . Laparotomy N/A 06/16/2012    Procedure: EXPLORATORY LAPAROTOMY;  Surgeon: Emelia LoronMatthew Wakefield, MD;  [facility];  Service: General;  Laterality: N/A;  Repair of duodenal ulcer  . Repair of perforated ulcer N/A 06/16/2012    Procedure: REPAIR OF PERFORATED ULCER;  Surgeon: Emelia LoronMatthew Wakefield, MD;   [facility];  Service: General;  Laterality: N/A;  duodenal   Family History  Problem Relation Age of Onset  . Anesthesia problems Neg Hx   . Hearing loss Neg Hx   . Breast cancer Paternal Grandmother   . Brain cancer     Social History  Substance Use Topics  . Smoking status: Never Smoker   . Smokeless tobacco: None  . Alcohol Use: No   OB History    Gravida Para Term Preterm AB TAB SAB Ectopic Multiple Living   1 1 1  0 0 0 0 0  1     Review of Systems  Constitutional: Positive for fever (subjective). Negative for chills.  HENT: Positive for dental problem and ear pain.       Allergies  Nsaids and Nsaids  Home Medications   Prior to Admission medications   Medication Sig Start Date End Date Taking? Authorizing Provider  acetaminophen (TYLENOL) 500 MG tablet Take 1 tablet (500 mg total) by mouth every 6 (six) hours as needed. Patient taking differently: Take 500 mg by mouth every 6 (six) hours as needed for moderate pain.  03/29/15   Mady GemmaElizabeth C Westfall, PA-C  HYDROcodone-acetaminophen (NORCO/VICODIN) 5-325 MG tablet Take 1 tablet by mouth every 4 (four) hours as needed. 10/02/15   Kristen N Ward, DO  metroNIDAZOLE (FLAGYL) 500 MG tablet Take 1 tablet (500 mg  total) by mouth 2 (two) times daily. 06/26/15   Lori A Clemmons, CNM  oxyCODONE-acetaminophen (PERCOCET/ROXICET) 5-325 MG tablet Take 1 tablet by mouth every 4 (four) hours as needed for severe pain. 06/26/15   Lori A Clemmons, CNM  penicillin v potassium (VEETID) 500 MG tablet Take 1 tablet (500 mg total) by mouth 4 (four) times daily. 10/02/15   Kristen N Ward, DO  promethazine (PHENERGAN) 25 MG tablet Take 0.5 tablets (12.5 mg total) by mouth every 6 (six) hours as needed for nausea. 05/03/12 05/10/12  Hope Orlene Och, NP  promethazine (PHENERGAN) 25 MG tablet Take 1 tablet (25 mg total) by mouth every 6 (six) hours as needed for nausea or vomiting. 10/02/15   Kristen N Ward, DO   BP 135/98 mmHg  Pulse 98  Temp(Src) 98.3 F  (36.8 C) (Oral)  Resp 17  SpO2 98%  LMP 09/23/2015 Physical Exam  Constitutional: She is oriented to person, place, and time. She appears well-developed and well-nourished. No distress.  HENT:  Head: Normocephalic and atraumatic.  Dental decay noted to tooth #29 with tenderness to the gingival but no gingival erythema noted. no cervical LAD. No trismus.   Eyes: EOM are normal.  Neck: Neck supple.  Cardiovascular: Normal rate.   Pulmonary/Chest: Effort normal. No respiratory distress.  Abdominal: She exhibits no distension.  Musculoskeletal: Normal range of motion.  Neurological: She is alert and oriented to person, place, and time.  Skin: Skin is warm and dry.  Psychiatric: She has a normal mood and affect. Her behavior is normal.  Nursing note and vitals reviewed.   ED Course  .Nerve Block Date/Time: 10/15/2015 7:11 PM Performed by: Fayrene Helper Authorized by: Fayrene Helper Consent: Verbal consent obtained. Risks and benefits: risks, benefits and alternatives were discussed Consent given by: patient Patient understanding: patient states understanding of the procedure being performed Patient consent: the patient's understanding of the procedure matches consent given Patient identity confirmed: verbally with patient and arm band Indications: pain relief Body area: face/mouth Nerve: inferior alveolar Laterality: right Patient sedated: no Patient position: sitting Needle gauge: 27 G Location technique: anatomical landmarks Local anesthetic: bupivacaine 0.5% with epinephrine Anesthetic total: 1.8 ml Outcome: pain improved Patient tolerance: Patient tolerated the procedure well with no immediate complications   (including critical care time) DIAGNOSTIC STUDIES: Oxygen Saturation is 98% on RA, nl by my interpretation.    COORDINATION OF CARE: 6:56 PM Discussed treatment plan with pt at bedside which includes dental block and pt agreed to plan.     MDM   Final  diagnoses:  Pain due to dental caries    Patient with dentalgia.  No abscess requiring immediate incision and drainage.  Exam not concerning for Ludwig's angina or pharyngeal abscess.  Will treat with dental block while in the ED. Will discharge home with a Rx of penicillin and tylenol. Pt instructed to follow-up with dentist.  Discussed return precautions. Pt safe for discharge.  I personally performed the services described in this documentation, which was scribed in my presence. The recorded information has been reviewed and is accurate.      Fayrene Helper, PA-C 10/15/15 1911  Arby Barrette, MD 10/16/15 [phone]

## 2015-10-15 NOTE — Discharge Instructions (Signed)
Dental Caries °Dental caries is tooth decay. This decay can cause a hole in teeth (cavity) that can get bigger and deeper over time. °HOME CARE °· Brush and floss your teeth. Do this at least two times a day. °· Use a fluoride toothpaste. °· Use a mouth rinse if told by your dentist or doctor. °· Eat less sugary and starchy foods. Drink less sugary drinks. °· Avoid snacking often on sugary and starchy foods. Avoid sipping often on sugary drinks. °· Keep regular checkups and cleanings with your dentist. °· Use fluoride supplements if told by your dentist or doctor. °· Allow fluoride to be applied to teeth if told by your dentist or doctor. °  °This information is not intended to replace advice given to you by your health care provider. Make sure you discuss any questions you have with your health care provider. °  °Document Released: 01/20/2008 Document Revised: 05/03/2014 Document Reviewed: 04/14/2012 °Elsevier Interactive Patient Education ©2016 Elsevier Inc. °Community Resource Guide Dental °The United Way’s “211” is a great source of information about community services available.  Access by dialing 2-1-1 from anywhere in [place], or by website -  www.nc211.org.  ° °Other Local Resources (Updated 04/2015) ° °Dental  Care °  °Services ° °  °Phone Number and Address  °Cost  °[facility] For children 0 - 21 years of age:  °• Cleaning °• Tooth brushing/flossing instruction °• Sealants, fillings, crowns °• Extractions °• Emergency treatment  [phone] °[address] °[place], [place] [address] Charges based on family income.  Medicaid and some insurance plans accepted.   °  °[facility] [place] • Cleaning °• Sealants, fillings, crowns °• Extractions °• Emergency treatment [phone] °[address] °[place], [place] ° Pregnant women 18 years of age or older with a Medicaid card  °[facility] [place] • Cleaning °• Sealants, fillings, crowns °• Extractions °• Emergency treatment [phone] °[address] °[place], [place] Pregnant women 18 years of age or older with a Medicaid card  °[facility] For children 0 - 21 years of age:  °• Cleaning °• Tooth brushing/flossing instruction °• Sealants, fillings, crowns °• Extractions °• Emergency treatment °Limited orthodontic services for patients with Medicaid [phone] °[address] °[place], [place] [address] Medicaid and [place] Health Choice cover for children up to age 21 and pregnant women.  Parents of children up to age 21 without Medicaid pay a reduced fee at time of service.  °[facility] [place] For children 0 - 21 years of age:  °• Cleaning °• Tooth brushing/flossing instruction °• Sealants, fillings, crowns °• Extractions °• Emergency treatment °Limited orthodontic services for patients with Medicaid [phone] °[address] °[place], [place].  Medicaid and [place] Health Choice cover for children up to age 21 and pregnant women.  Parents of children up to age 21 without Medicaid pay a reduced fee.  °[facility] • Cleaning °• Sealants, fillings, crowns °• Extractions ° °Hours: Tuesdays and Thursdays, 4:15 - 8 pm [phone] °[address] °[place], [place] [address] Services free of charge to [place] residents ages 18-64 who do not have health insurance, Medicare, Medicaid, or VA benefits and fall within federal poverty guidelines  °[facility] ° ° ° Provides dental care in addition to primary medical care, nutritional counseling,   and pharmacy: °• Cleaning °• Sealants, fillings, crowns °• Extractions ° ° ° ° ° ° ° ° ° ° ° ° ° ° ° ° ° [phone] °[facility], [address] °[place], [place] ° °[phone] °[facility], [address] [place], [place] ° °[phone] °[facility] °[place], [place] ° °[phone] °[facility], [address] °[place], [place] ° °[phone] °[facility] °[address] °[place], [place] Accepts Medicaid, Medicare, most insurance.  Also provides services available to all with fees adjusted based on ability to pay.    °[facility] • Cleaning °• Tooth brushing/flossing instruction °• Sealants, fillings, crowns °• Extractions °• Emergency treatment °Hours: Tuesdays, Thursdays, and Fridays from 8 am to 5 pm by appointment only. [phone] °[address] °[place], [place] [address] [place] residents with Medicaid (depending on eligibility) and children with [place] Health Choice - call for more information.  °[facility] • Extractions only ° °Hours: 2nd and 4th Thursday of each month from 6:30 am - 9 am.   [phone] ext. [phone] °[address] °[place], [place] [address] Ages 18 and older only.  Patients are seen on a first come, first served basis.  °[facility] • Cleanings °• Fillings °• Extractions °• Orthodontics °• Endodontics °• Implants/Crowns/Bridges °• Complete and partial dentures [phone] °[place],  Patients must complete an application for services.  There is often a waiting list.   ° °

## 2015-10-15 NOTE — ED Notes (Signed)
She c/o several month history of R lower back toothache. Pain increased in severity today. She tried tylenol and advil with no relief of the pain

## 2015-12-12 ENCOUNTER — Encounter (HOSPITAL_COMMUNITY): Payer: Self-pay

## 2015-12-12 ENCOUNTER — Emergency Department (HOSPITAL_COMMUNITY)
Admission: EM | Admit: 2015-12-12 | Discharge: 2015-12-12 | Disposition: A | Discharge status: Home / Self Care | Payer: No Typology Code available for payment source | Attending: Emergency Medicine | Admitting: Emergency Medicine

## 2015-12-12 DIAGNOSIS — Y999 Unspecified external cause status: Secondary | ICD-10-CM | POA: Diagnosis not present

## 2015-12-12 DIAGNOSIS — Y92410 Unspecified street and highway as the place of occurrence of the external cause: Secondary | ICD-10-CM | POA: Insufficient documentation

## 2015-12-12 DIAGNOSIS — Z79899 Other long term (current) drug therapy: Secondary | ICD-10-CM | POA: Insufficient documentation

## 2015-12-12 DIAGNOSIS — M25561 Pain in right knee: Secondary | ICD-10-CM | POA: Insufficient documentation

## 2015-12-12 DIAGNOSIS — Z5321 Procedure and treatment not carried out due to patient leaving prior to being seen by health care provider: Secondary | ICD-10-CM | POA: Insufficient documentation

## 2015-12-12 DIAGNOSIS — Y939 Activity, unspecified: Secondary | ICD-10-CM | POA: Insufficient documentation

## 2015-12-12 DIAGNOSIS — M25562 Pain in left knee: Secondary | ICD-10-CM | POA: Diagnosis not present
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
## 2015-12-12 NOTE — ED Notes (Signed)
Pts name called for a room no answer 

## 2016-02-02 ENCOUNTER — Emergency Department (HOSPITAL_COMMUNITY)
Admission: EM | Admit: 2016-02-02 | Discharge: 2016-02-02 | Disposition: A | Discharge status: Home / Self Care | Payer: Medicaid Other | Attending: Dermatology | Admitting: Dermatology

## 2016-02-02 ENCOUNTER — Encounter (HOSPITAL_COMMUNITY): Payer: Self-pay

## 2016-02-02 DIAGNOSIS — R109 Unspecified abdominal pain: Secondary | ICD-10-CM | POA: Insufficient documentation

## 2016-02-02 LAB — URINALYSIS, ROUTINE W REFLEX MICROSCOPIC
Bilirubin Urine: NEGATIVE
Glucose, UA: NEGATIVE mg/dL
Hgb urine dipstick: NEGATIVE
KETONES UR: NEGATIVE mg/dL
NITRITE: NEGATIVE
PROTEIN: NEGATIVE mg/dL
Specific Gravity, Urine: 1.018 (ref 1.005–1.030)
pH: 6.5 (ref 5.0–8.0)

## 2016-02-02 LAB — COMPREHENSIVE METABOLIC PANEL
ALT: 12 U/L — AB (ref 14–54)
AST: 17 U/L (ref 15–41)
Albumin: 3.7 g/dL (ref 3.5–5.0)
Alkaline Phosphatase: 63 U/L (ref 38–126)
Anion gap: 7 (ref 5–15)
BILIRUBIN TOTAL: 0.4 mg/dL (ref 0.3–1.2)
BUN: 7 mg/dL (ref 6–20)
CO2: 24 mmol/L (ref 22–32)
CREATININE: 0.96 mg/dL (ref 0.44–1.00)
Calcium: 9.1 mg/dL (ref 8.9–10.3)
Chloride: 109 mmol/L (ref 101–111)
GFR calc Af Amer: >60 mL/min (ref >60)
Glucose, Bld: 106 mg/dL — ABNORMAL HIGH (ref 65–99)
POTASSIUM: 3.7 mmol/L (ref 3.5–5.1)
Sodium: 140 mmol/L (ref 135–145)
TOTAL PROTEIN: 6.2 g/dL — AB (ref 6.5–8.1)

## 2016-02-02 LAB — URINE MICROSCOPIC-ADD ON

## 2016-02-02 LAB — CBC
HCT: 40.3 % (ref 36.0–46.0)
Hemoglobin: 13.4 g/dL (ref 12.0–15.0)
MCH: 27.1 pg (ref 26.0–34.0)
MCHC: 33.3 g/dL (ref 30.0–36.0)
MCV: 81.4 fL (ref 78.0–100.0)
PLATELETS: 279 10*3/uL (ref 150–400)
RBC: 4.95 MIL/uL (ref 3.87–5.11)
RDW: 13.4 % (ref 11.5–15.5)
WBC: 6.9 10*3/uL (ref 4.0–10.5)

## 2016-02-02 LAB — POC URINE PREG, ED: Preg Test, Ur: NEGATIVE

## 2016-02-02 LAB — LIPASE, BLOOD: Lipase: 21 U/L (ref 11–51)

## 2016-02-02 NOTE — ED Triage Notes (Signed)
Pt with LLQ pain pt has history of ovarian cysts that she states this feels similar to

## 2016-02-02 NOTE — ED Notes (Signed)
Called for patient 3x, no response.  

## 2016-02-02 NOTE — ED Notes (Signed)
CALL X2 TO RECHECK VITALSIGNS  NO ANSWER

## 2016-03-30 ENCOUNTER — Emergency Department (HOSPITAL_COMMUNITY): Payer: Self-pay

## 2016-03-30 ENCOUNTER — Encounter (HOSPITAL_COMMUNITY): Payer: Self-pay | Admitting: Emergency Medicine

## 2016-03-30 ENCOUNTER — Emergency Department (HOSPITAL_COMMUNITY)
Admission: EM | Admit: 2016-03-30 | Discharge: 2016-03-30 | Disposition: A | Discharge status: Home / Self Care | Payer: Self-pay | Attending: Emergency Medicine | Admitting: Emergency Medicine

## 2016-03-30 DIAGNOSIS — N83201 Unspecified ovarian cyst, right side: Secondary | ICD-10-CM | POA: Insufficient documentation

## 2016-03-30 DIAGNOSIS — Z79899 Other long term (current) drug therapy: Secondary | ICD-10-CM | POA: Insufficient documentation

## 2016-03-30 DIAGNOSIS — R05 Cough: Secondary | ICD-10-CM | POA: Insufficient documentation

## 2016-03-30 DIAGNOSIS — R059 Cough, unspecified: Secondary | ICD-10-CM

## 2016-03-30 DIAGNOSIS — N83209 Unspecified ovarian cyst, unspecified side: Secondary | ICD-10-CM

## 2016-03-30 LAB — URINALYSIS, ROUTINE W REFLEX MICROSCOPIC
BILIRUBIN URINE: NEGATIVE
Glucose, UA: NEGATIVE mg/dL
Hgb urine dipstick: NEGATIVE
Ketones, ur: NEGATIVE mg/dL
Nitrite: NEGATIVE
PROTEIN: NEGATIVE mg/dL
SPECIFIC GRAVITY, URINE: 1.013 (ref 1.005–1.030)
pH: 6 (ref 5.0–8.0)

## 2016-03-30 LAB — I-STAT BETA HCG BLOOD, ED (MC, WL, AP ONLY)

## 2016-03-30 LAB — COMPREHENSIVE METABOLIC PANEL
ALBUMIN: 4.4 g/dL (ref 3.5–5.0)
ALT: 13 U/L — ABNORMAL LOW (ref 14–54)
AST: 15 U/L (ref 15–41)
Alkaline Phosphatase: 65 U/L (ref 38–126)
Anion gap: 7 (ref 5–15)
BUN: 10 mg/dL (ref 6–20)
CHLORIDE: 108 mmol/L (ref 101–111)
CO2: 25 mmol/L (ref 22–32)
Calcium: 9.5 mg/dL (ref 8.9–10.3)
Creatinine, Ser: 0.86 mg/dL (ref 0.44–1.00)
GFR calc Af Amer: >60 mL/min (ref >60)
GFR calc non Af Amer: >60 mL/min (ref >60)
GLUCOSE: 95 mg/dL (ref 65–99)
POTASSIUM: 3.9 mmol/L (ref 3.5–5.1)
SODIUM: 140 mmol/L (ref 135–145)
Total Bilirubin: 0.5 mg/dL (ref 0.3–1.2)
Total Protein: 7.7 g/dL (ref 6.5–8.1)

## 2016-03-30 LAB — CBC
HEMATOCRIT: 40.4 % (ref 36.0–46.0)
HEMOGLOBIN: 14.1 g/dL (ref 12.0–15.0)
MCH: 27.4 pg (ref 26.0–34.0)
MCHC: 34.9 g/dL (ref 30.0–36.0)
MCV: 78.6 fL (ref 78.0–100.0)
Platelets: 295 10*3/uL (ref 150–400)
RBC: 5.14 MIL/uL — ABNORMAL HIGH (ref 3.87–5.11)
RDW: 13.4 % (ref 11.5–15.5)
WBC: 9.9 10*3/uL (ref 4.0–10.5)

## 2016-03-30 LAB — RAPID STREP SCREEN (MED CTR MEBANE ONLY): STREPTOCOCCUS, GROUP A SCREEN (DIRECT): NEGATIVE

## 2016-03-30 LAB — WET PREP, GENITAL
Sperm: NONE SEEN
TRICH WET PREP: NONE SEEN
YEAST WET PREP: NONE SEEN

## 2016-03-30 LAB — LIPASE, BLOOD: LIPASE: 18 U/L (ref 11–51)

## 2016-03-30 MED ORDER — HYDROCODONE-ACETAMINOPHEN 5-325 MG PO TABS: 1.0000 | ORAL_TABLET | Freq: Four times a day (QID) | ORAL | 0 refills | Status: DC | PRN

## 2016-03-30 MED ORDER — IOPAMIDOL (ISOVUE-300) INJECTION 61%
INTRAVENOUS | Status: AC
  Filled 2016-03-30: qty 100

## 2016-03-30 MED ORDER — SODIUM CHLORIDE 0.9 % IJ SOLN
INTRAMUSCULAR | Status: AC
  Filled 2016-03-30: qty 50

## 2016-03-30 MED ORDER — MORPHINE SULFATE (PF) 4 MG/ML IV SOLN
4.0000 mg | Freq: Once | INTRAVENOUS | Status: AC
  Administered 2016-03-30: 4 mg via INTRAVENOUS
  Filled 2016-03-30: qty 1

## 2016-03-30 MED ORDER — IOPAMIDOL (ISOVUE-300) INJECTION 61%
100.0000 mL | Freq: Once | INTRAVENOUS | Status: AC | PRN
  Administered 2016-03-30: 100 mL via INTRAVENOUS

## 2016-03-30 MED ORDER — SODIUM CHLORIDE 0.9 % IV BOLUS (SEPSIS)
1000.0000 mL | Freq: Once | INTRAVENOUS | Status: AC
  Administered 2016-03-30: 1000 mL via INTRAVENOUS

## 2016-03-30 NOTE — ED Provider Notes (Signed)
[facility]-EMERGENCY DEPT Provider Note   CSN: [id number] Arrival date & time: 03/30/16  1514     History   Chief Complaint Chief Complaint  Patient presents with  . Abdominal Pain  . Sore Throat    HPI [name] is a 25 y.o. female.  The history is provided by the patient.  Abdominal Pain   This is a new problem. The current episode started yesterday. Episode frequency: intermittnet. The problem has been resolved. The pain is located in the RLQ. The pain is moderate. Associated symptoms include nausea, vomiting (once after coughing), headaches and myalgias. Pertinent negatives include anorexia, fever, diarrhea, melena and dysuria. Nothing aggravates the symptoms. Nothing relieves the symptoms.  Cough  This is a new problem. Episode onset: 3 days. Episode frequency: intermittent. The problem has not changed since onset.The cough is productive of sputum (clear). There has been no fever. Associated symptoms include chest pain (only with coughing), chills, headaches, rhinorrhea, sore throat and myalgias. Pertinent negatives include no shortness of breath. She has tried nothing for the symptoms. She is not a smoker. Her past medical history does not include COPD or asthma.   LMP: 03/13/16  Past Medical History:  Diagnosis Date  . Constipation   . Gastric ulcer   . History of dysmenorrhea   . Hypoglycemia   . Ovarian cyst 2011  . Sickle cell trait [facility](HCC)     Patient Active Problem List   Diagnosis Date Noted  . DU (perforated duodenal ulcer) (HCC) 06/18/2012  . Normal delivery 06/03/2012  . Anemia of mother, with delivery(648.21) 06/03/2012  . Endometritis following delivery 06/01/2012  . Preterm labor 03/27/2012  . Seasonal allergies 09/10/2010  . New onset of headaches 09/10/2010  . Fever blister 07/08/2010  . OVARIAN CYST, LEFT 10/29/2008  . Abdominal pain, generalized 05/08/2007  . ECZEMA, ATOPIC DERMATITIS 06/23/2006    Past Surgical History:  Procedure  Laterality Date  . EYE SURGERY     2004  . LAPAROSCOPIC OVARIAN CYSTECTOMY  2011  . LAPAROTOMY N/A 06/16/2012   Procedure: EXPLORATORY LAPAROTOMY;  Surgeon: Emelia LoronMatthew Wakefield, MD;  [facility];  Service: General;  Laterality: N/A;  Repair of duodenal ulcer  . REPAIR OF PERFORATED ULCER N/A 06/16/2012   Procedure: REPAIR OF PERFORATED ULCER;  Surgeon: Emelia LoronMatthew Wakefield, MD;  Location: [facility] OR;  Service: General;  Laterality: N/A;  duodenal    OB History    Gravida Para Term Preterm AB Living   1 1 1  0 0 1   SAB TAB Ectopic Multiple Live Births   0 0 0   1       Home Medications    Prior to Admission medications   Medication Sig Start Date End Date Taking? Authorizing Provider  Ibuprofen-Diphenhydramine HCl (ADVIL PM) 200-25 MG CAPS Take 2 tablets by mouth at bedtime as needed (pain/sleep.).   Yes Historical Provider, MD  metroNIDAZOLE (FLAGYL) 500 MG tablet Take 1 tablet (500 mg total) by mouth 2 (two) times daily. Patient not taking: Reported on 03/30/2016 06/26/15   Lawson FiscalLori A Clemmons, CNM  penicillin v potassium (VEETID) 500 MG tablet Take 1 tablet (500 mg total) by mouth 4 (four) times daily. Patient not taking: Reported on 03/30/2016 10/15/15   Fayrene HelperBowie Tran, PA-C  promethazine (PHENERGAN) 25 MG tablet Take 0.5 tablets (12.5 mg total) by mouth every 6 (six) hours as needed for nausea. 05/03/12 05/10/12  Hope Orlene OchM Neese, NP    Family History Family History  Problem Relation Age of Onset  .  Breast cancer Paternal Grandmother   . Brain cancer    . Anesthesia problems Neg Hx   . Hearing loss Neg Hx     Social History Social History  Substance Use Topics  . Smoking status: Never Smoker  . Smokeless tobacco: Never Used  . Alcohol use No     Allergies   Nsaids and Nsaids   Review of Systems Review of Systems  Constitutional: Positive for chills. Negative for fever.  HENT: Positive for rhinorrhea and sore throat.   Respiratory: Positive for cough. Negative for shortness of breath.    Cardiovascular: Positive for chest pain (only with coughing).  Gastrointestinal: Positive for abdominal pain, nausea and vomiting (once after coughing). Negative for anorexia, diarrhea and melena.  Genitourinary: Negative for dysuria.  Musculoskeletal: Positive for myalgias.  Neurological: Positive for headaches.  Ten systems are reviewed and are negative for acute change except as noted in the HPI    Physical Exam Updated Vital Signs BP 139/88 (BP Location: Right Arm)   Pulse 98   Temp 98.5 F (36.9 C) (Oral)   Resp 18   Ht 5\' 3"  (1.6 m)   Wt 170 lb (77.1 kg)   LMP 03/13/2016   SpO2 95%   BMI 30.11 kg/m   Physical Exam  Constitutional: She is oriented to person, place, and time. She appears well-developed and well-nourished. No distress.  HENT:  Head: Normocephalic and atraumatic.  Nose: Mucosal edema and rhinorrhea present.  Mouth/Throat: Posterior oropharyngeal erythema present. No oropharyngeal exudate. Tonsils are 2+ on the right. Tonsils are 2+ on the left. Tonsillar exudate.  Eyes: Conjunctivae and EOM are normal. Pupils are equal, round, and reactive to light. Right eye exhibits no discharge. Left eye exhibits no discharge. No scleral icterus.  Neck: Normal range of motion. Neck supple.  Cardiovascular: Normal rate and regular rhythm.  Exam reveals no gallop and no friction rub.   No murmur heard. Pulmonary/Chest: Effort normal and breath sounds normal. No stridor. No respiratory distress. She has no rales.  Abdominal: Soft. She exhibits no distension. There is tenderness in the right lower quadrant. There is guarding and tenderness at McBurney's point. There is no rigidity, no rebound and no CVA tenderness. No hernia.  +rovsing's and psoas signs  Genitourinary: Cervix exhibits no motion tenderness, no discharge and no friability. Right adnexum displays no mass and no tenderness. Left adnexum displays no mass and no tenderness. No erythema, tenderness or bleeding in the  vagina. No vaginal discharge found.  Musculoskeletal: She exhibits no edema or tenderness.  Neurological: She is alert and oriented to person, place, and time.  Skin: Skin is warm and dry. No rash noted. She is not diaphoretic. No erythema.  Psychiatric: She has a normal mood and affect.  Vitals reviewed.    ED Treatments / Results  Labs (all labs ordered are listed, but only abnormal results are displayed) Labs Reviewed  WET PREP, GENITAL - Abnormal; Notable for the following:       Result Value   Clue Cells Wet Prep HPF POC PRESENT (*)    WBC, Wet Prep HPF POC RARE (*)    All other components within normal limits  COMPREHENSIVE METABOLIC PANEL - Abnormal; Notable for the following:    ALT 13 (*)    All other components within normal limits  CBC - Abnormal; Notable for the following:    RBC 5.14 (*)    All other components within normal limits  URINALYSIS, ROUTINE W REFLEX MICROSCOPIC -  Abnormal; Notable for the following:    APPearance HAZY (*)    Leukocytes, UA LARGE (*)    Bacteria, UA RARE (*)    Squamous Epithelial / LPF 0-5 (*)    All other components within normal limits  RAPID STREP SCREEN (NOT AT [facility])  CULTURE, GROUP A STREP ([facility])  LIPASE, BLOOD  I-STAT BETA HCG BLOOD, ED ([facility], [facility], [facility] ONLY)  GC/CHLAMYDIA PROBE AMP ([facility]) NOT AT [facility]    EKG  EKG Interpretation None       Radiology No results found.  Procedures Procedures (including critical care time)  Medications Ordered in ED Medications  morphine 4 MG/ML injection 4 mg (4 mg Intravenous Given 03/30/16 1744)  sodium chloride 0.9 % bolus 1,000 mL (1,000 mLs Intravenous New Bag/Given 03/30/16 1744)     Initial Impression / Assessment and Plan / ED Course  I have reviewed the triage vital signs and the nursing notes.  Pertinent labs & imaging results that were available during my care of the patient were reviewed by me and considered in my medical decision making (see chart for  details).  Clinical Course     1. Cough/sore throat Rapid strep negative. Likely viral process. Pending CXR to rule out pneumonia.  2. RLQ TTP Positive McBurney's point, rovsing's and psoas sign concerning for appendicitis. Pelvic exam without evidence of cervicitis, no suspicion for PID. Unlikely torsion. BHCG negative. Pending CT scan to rule out appendicitis.  Patient care turned over to Dr Judd Lien at 1800. Patient case and results discussed in detail; please see their note for further ED managment.       Nira Conn, MD 03/30/16 [phone]

## 2016-03-30 NOTE — ED Notes (Signed)
Pt transported to ultrasound.

## 2016-03-30 NOTE — ED Notes (Signed)
Pt ambulated to restroom. 

## 2016-03-30 NOTE — Discharge Instructions (Signed)
Hydrocodone as prescribed as needed for pain.  Follow-up with your gynecologist in the next week for a recheck. You should have a repeat ultrasound done in the next 2-3 months.

## 2016-03-30 NOTE — Progress Notes (Signed)
EDCM spoke to patient at bedside. Patient confirms she does not have a pcp or insurance [place].  [facility], informed patient of services there.  EDCM also provided patient with list of pcps who accept self pay patients, list of discount pharmacies and websites needymeds.org and GoodRX.com for medication assistance, phone number to inquire about the orange card, phone number to inquire about Medicaid, phone number to inquire about the Affordable Care Act, financial resources in the community such as local churches, [employer], urban ministries, and dental assistance for uninsured patients.  Patient thankful for resources.  No further EDCM needs at this time.  Patient reports she will be getting insurance through her job in January.

## 2016-03-30 NOTE — ED Triage Notes (Signed)
Patient is complaining of right lower quadrant abdominal pain and a sore throat since Saturday.

## 2016-03-31 LAB — GC/CHLAMYDIA PROBE AMP (~~LOC~~) NOT AT ARMC
Chlamydia: NEGATIVE
NEISSERIA GONORRHEA: NEGATIVE

## 2016-04-02 LAB — CULTURE, GROUP A STREP (THRC)

## 2016-04-26 NOTE — L&D Delivery Note (Signed)
Operative Delivery Note At  a viable female was delivered via kiwi vac at crowning station for maternal fatigue .  Presentation: vertex; Position: Left,, Occiput,, Anterior; Station:+3.  Verbal consent: obtained from patient.  Risks and benefits discussed in detail.  Risks include, but are not limited to the risks of anesthesia, bleeding, infection, damage to maternal tissues, fetal cephalhematoma.  There is also the risk of inability to effect vaginal delivery of the head, or shoulder dystocia that cannot be resolved by established maneuvers, leading to the need for emergency cesarean section.  APGAR:9 ,9 ; weight  .   Placenta status:spont , .   Cord: 3vc  with the following complications:none .  Cord pH: n/a  Anesthesia: epidural  Instruments: kiwi Episiotomy:  none Lacerations:  2nd degree Suture Repair: 2.0 vicryl Est. Blood Loss (mL):    Mom to postpartum.  Baby to Couplet care / Skin to Skin.  [name] 04/10/2017, 7:23 PM

## 2016-05-12 ENCOUNTER — Inpatient Hospital Stay (HOSPITAL_COMMUNITY)
Admission: AD | Admit: 2016-05-12 | Discharge: 2016-05-12 | Disposition: A | Discharge status: Home / Self Care | Payer: Self-pay | Source: Ambulatory Visit | Attending: Family Medicine | Admitting: Family Medicine

## 2016-05-12 ENCOUNTER — Encounter (HOSPITAL_COMMUNITY): Payer: Self-pay

## 2016-05-12 ENCOUNTER — Inpatient Hospital Stay (HOSPITAL_COMMUNITY): Payer: Self-pay

## 2016-05-12 DIAGNOSIS — O209 Hemorrhage in early pregnancy, unspecified: Secondary | ICD-10-CM | POA: Insufficient documentation

## 2016-05-12 DIAGNOSIS — O4690 Antepartum hemorrhage, unspecified, unspecified trimester: Secondary | ICD-10-CM

## 2016-05-12 DIAGNOSIS — O3680X0 Pregnancy with inconclusive fetal viability, not applicable or unspecified: Secondary | ICD-10-CM

## 2016-05-12 DIAGNOSIS — Z3A00 Weeks of gestation of pregnancy not specified: Secondary | ICD-10-CM | POA: Insufficient documentation

## 2016-05-12 LAB — CBC
HCT: 37.6 % (ref 36.0–46.0)
HEMOGLOBIN: 13.3 g/dL (ref 12.0–15.0)
MCH: 27.8 pg (ref 26.0–34.0)
MCHC: 35.4 g/dL (ref 30.0–36.0)
MCV: 78.5 fL (ref 78.0–100.0)
PLATELETS: 316 10*3/uL (ref 150–400)
RBC: 4.79 MIL/uL (ref 3.87–5.11)
RDW: 13.4 % (ref 11.5–15.5)
WBC: 8.6 10*3/uL (ref 4.0–10.5)

## 2016-05-12 LAB — URINALYSIS, ROUTINE W REFLEX MICROSCOPIC
BILIRUBIN URINE: NEGATIVE
Glucose, UA: NEGATIVE mg/dL
KETONES UR: NEGATIVE mg/dL
Nitrite: NEGATIVE
PH: 5 (ref 5.0–8.0)
Protein, ur: NEGATIVE mg/dL
Specific Gravity, Urine: 1.011 (ref 1.005–1.030)

## 2016-05-12 LAB — WET PREP, GENITAL
SPERM: NONE SEEN
Trich, Wet Prep: NONE SEEN
YEAST WET PREP: NONE SEEN

## 2016-05-12 LAB — POCT PREGNANCY, URINE: Preg Test, Ur: POSITIVE — AB

## 2016-05-12 LAB — HCG, QUANTITATIVE, PREGNANCY: HCG, BETA CHAIN, QUANT, S: 46 m[IU]/mL — AB (ref <5)

## 2016-05-12 NOTE — Discharge Instructions (Signed)
Ectopic Pregnancy °An ectopic pregnancy is when the fertilized egg attaches (implants) outside the uterus. Most ectopic pregnancies occur in one of the tubes where eggs travel from the ovary to the uterus (fallopian tubes), but the implanting can occur in other locations. In rare cases, ectopic pregnancies occur on the ovary, intestine, pelvis, abdomen, or cervix. In an ectopic pregnancy, the fertilized egg does not have the ability to develop into a normal, healthy baby. °A ruptured ectopic pregnancy is one in which tearing or bursting of a fallopian tube causes internal bleeding. Often, there is intense lower abdominal pain, and vaginal bleeding sometimes occurs. Having an ectopic pregnancy can be life-threatening. If this dangerous condition is not treated, it can lead to blood loss, shock, or even death. °What are the causes? °The most common cause of this condition is damage to one of the fallopian tubes. A fallopian tube may be narrowed or blocked, and that keeps the fertilized egg from reaching the uterus. °What increases the risk? °This condition is more likely to develop in women of childbearing age who have different levels of risk. The levels of risk can be divided into three categories. °High risk  °· You have gone through infertility treatment. °· You have had an ectopic pregnancy before. °· You have had surgery on the fallopian tubes, or another surgical procedure, such as an abortion. °· You have had surgery to have the fallopian tubes tied (tubal ligation). °· You have problems or diseases of the fallopian tubes. °· You have been exposed to diethylstilbestrol (DES). This medicine was used until 1971, and it had effects on babies whose mothers took the medicine. °· You become pregnant while using an IUD (intrauterine device) for birth control. °Moderate risk  °· You have a history of infertility. °· You have had an STI (sexually transmitted infection). °· You have a history of pelvic inflammatory  disease (PID). °· You have scarring from endometriosis. °· You have multiple sexual partners. °· You smoke. °Low risk  °· You have had pelvic surgery. °· You use vaginal douches. °· You became sexually active before age 18. °What are the signs or symptoms? °Common symptoms of this condition include normal pregnancy symptoms, such as missing a period, nausea, tiredness, abdominal pain, breast tenderness, and bleeding. However, ectopic pregnancy will have additional symptoms, such as: °· Pain with intercourse. °· Irregular vaginal bleeding or spotting. °· Cramping or pain on one side or in the lower abdomen. °· Fast heartbeat, low blood pressure, and sweating. °· Passing out while having a bowel movement. °Symptoms of a ruptured ectopic pregnancy and internal bleeding may include: °· Sudden, severe pain in the abdomen and pelvis. °· Dizziness, weakness, light-headedness, or fainting. °· Pain in the shoulder or neck area. °How is this diagnosed? °This condition is diagnosed by: °· A pelvic exam to locate pain or a mass in the abdomen. °· A pregnancy test. This blood test checks for the presence as well as the specific level of pregnancy hormone in the bloodstream. °· Ultrasound. This is performed if a pregnancy test is positive. In this test, a probe is inserted into the vagina. The probe will detect a fetus, possibly in a location other than the uterus. °· Taking a sample of uterus tissue (dilation and curettage, or D&C). °· Surgery to perform a visual exam of the inside of the abdomen using a thin, lighted tube that has a tiny camera on the end (laparoscope). °· Culdocentesis. This procedure involves inserting a needle at the   top of the vagina, behind the uterus. If blood is present in this area, it may indicate that a fallopian tube is torn. °How is this treated? °This condition is treated with medicine or surgery. °Medicine  °· An injection of a medicine (methotrexate) may be given to cause the pregnancy tissue to  be absorbed. This medicine may save your fallopian tube. It may be given if: °¨ The diagnosis is made early, with no signs of active bleeding. °¨ The fallopian tube has not ruptured. °¨ You are considered to be a good candidate for the medicine. °Usually, pregnancy hormone blood levels are checked after methotrexate treatment. This is to be sure that the medicine is effective. It may take 4-6 weeks for the pregnancy to be absorbed. Most pregnancies will be absorbed by 3 weeks. °Surgery  °· A laparoscope may be used to remove the pregnancy tissue. °· If severe internal bleeding occurs, a larger cut (incision) may be made in the lower abdomen (laparotomy) to remove the fetus and placenta. This is done to stop the bleeding. °· Part or all of the fallopian tube may be removed (salpingectomy) along with the fetus and placenta. The fallopian tube may also be repaired during the surgery. °· In very rare circumstances, removal of the uterus (hysterectomy) may be required. °· After surgery, pregnancy hormone testing may be done to be sure that there is no pregnancy tissue left. °Whether your treatment is medicine or surgery, you may receive a Rho (D) immune globulin shot to prevent problems with any future pregnancy. This shot may be given if: °· You are Rh-negative and the baby's father is Rh-positive. °· You are Rh-negative and you do not know the Rh type of the baby's father. °Follow these instructions at home: °· Rest and limit your activity after the procedure for as long as told by your health care provider. °· Until your health care provider says that it is safe: °¨ Do not lift anything that is heavier than 10 lb (4.5 kg), or the limit that your health care provider tells you. °¨ Avoid physical exercise and any movement that requires effort (is strenuous). °· To help prevent constipation: °¨ Eat a healthy diet that includes fruits, vegetables, and whole grains. °¨ Drink 6-8 glasses of water per day. °Get help right  away if: °· You develop worsening pain that is not relieved by medicine. °· You have: °¨ A fever or chills. °¨ Vaginal bleeding. °¨ Redness and swelling at the incision site. °¨ Nausea and vomiting. °· You feel dizzy or weak. °· You feel light-headed or you faint. °This information is not intended to replace advice given to you by your health care provider. Make sure you discuss any questions you have with your health care provider. °Document Released: 05/20/2004 Document Revised: 12/10/2015 Document Reviewed: 11/12/2015 °Elsevier Interactive Patient Education © 2017 Elsevier Inc. ° °

## 2016-05-12 NOTE — MAU Provider Note (Signed)
History     CSN: [id number]  Arrival date and time: 05/12/16 2133   First Provider Initiated Contact with Patient 05/12/16 2201      Chief Complaint  Patient presents with  . Vaginal Bleeding   Vaginal Bleeding  The patient's primary symptoms include pelvic pain and vaginal bleeding. The patient's pertinent negatives include no vaginal discharge. This is a new problem. Episode onset: about 3 days ago.  The problem occurs intermittently. The problem has been unchanged. Pain severity now: 3/10  The problem affects the left side. She is pregnant. Associated symptoms include vomiting. Pertinent negatives include no chills, dysuria, fever or nausea. The vaginal bleeding is spotting (pink, only when wiping. ). She has not been passing clots. She has not been passing tissue. Nothing aggravates the symptoms. She has tried nothing for the symptoms. Her menstrual history has been regular (LMP: 04/07/16, then had some bleeding 1/7 that she thought was a period. +UPT today ).   Past Medical History:  Diagnosis Date  . Constipation   . Gastric ulcer   . History of dysmenorrhea   . Hypoglycemia   . Ovarian cyst 2011  . Sickle cell trait [facility])     Past Surgical History:  Procedure Laterality Date  . EYE SURGERY     2004  . LAPAROSCOPIC OVARIAN CYSTECTOMY  2011  . LAPAROTOMY N/A 06/16/2012   Procedure: EXPLORATORY LAPAROTOMY;  Surgeon: Emelia Loron, MD;  Location: [facility] OR;  Service: General;  Laterality: N/A;  Repair of duodenal ulcer  . REPAIR OF PERFORATED ULCER N/A 06/16/2012   Procedure: REPAIR OF PERFORATED ULCER;  Surgeon: Emelia Loron, MD;  Location: [facility] OR;  Service: General;  Laterality: N/A;  duodenal    Family History  Problem Relation Age of Onset  . Breast cancer Paternal Grandmother   . Brain cancer    . Anesthesia problems Neg Hx   . Hearing loss Neg Hx     Social History  Substance Use Topics  . Smoking status: Never Smoker  . Smokeless tobacco: Never Used  .  Alcohol use No    Allergies:  Allergies  Allergen Reactions  . Nsaids     Diagnosed with a perforated duodenal ulcer due to NSAIDS  . Nsaids     Prescriptions Prior to Admission  Medication Sig Dispense Refill Last Dose  . HYDROcodone-acetaminophen (NORCO) 5-325 MG tablet Take 1-2 tablets by mouth every 6 (six) hours as needed. 15 tablet 0   . Ibuprofen-Diphenhydramine HCl (ADVIL PM) 200-25 MG CAPS Take 2 tablets by mouth at bedtime as needed (pain/sleep.).   03/27/2016  . metroNIDAZOLE (FLAGYL) 500 MG tablet Take 1 tablet (500 mg total) by mouth 2 (two) times daily. (Patient not taking: Reported on 03/30/2016) 14 tablet 0 Not Taking at Unknown time  . penicillin v potassium (VEETID) 500 MG tablet Take 1 tablet (500 mg total) by mouth 4 (four) times daily. (Patient not taking: Reported on 03/30/2016) 40 tablet 0 Not Taking at Unknown time  . promethazine (PHENERGAN) 25 MG tablet Take 0.5 tablets (12.5 mg total) by mouth every 6 (six) hours as needed for nausea. 20 tablet 0     Review of Systems  Constitutional: Negative for chills and fever.  Gastrointestinal: Positive for vomiting. Negative for nausea.  Genitourinary: Positive for pelvic pain and vaginal bleeding. Negative for dysuria and vaginal discharge.   Physical Exam   Blood pressure 121/83, pulse 86, temperature 98.3 F (36.8 C), temperature source Oral, resp. rate 18, last menstrual period  05/02/2016.  Physical Exam  Nursing note and vitals reviewed. Constitutional: She is oriented to person, place, and time. She appears well-developed and well-nourished. No distress.  HENT:  Head: Normocephalic.  Cardiovascular: Normal rate.   Respiratory: Effort normal.  GI: Soft. There is no tenderness. There is no rebound.  Neurological: She is alert and oriented to person, place, and time.  Skin: Skin is warm and dry.  Psychiatric: She has a normal mood and affect.     Results for orders placed or performed during the hospital  encounter of 05/12/16 (from the past 24 hour(s))  Urinalysis, Routine w reflex microscopic     Status: Abnormal   Collection Time: 05/12/16  9:39 PM  Result Value Ref Range   Color, Urine YELLOW YELLOW   APPearance HAZY (A) CLEAR   Specific Gravity, Urine 1.011 1.005 - 1.030   pH 5.0 5.0 - 8.0   Glucose, UA NEGATIVE NEGATIVE mg/dL   Hgb urine dipstick LARGE (A) NEGATIVE   Bilirubin Urine NEGATIVE NEGATIVE   Ketones, ur NEGATIVE NEGATIVE mg/dL   Protein, ur NEGATIVE NEGATIVE mg/dL   Nitrite NEGATIVE NEGATIVE   Leukocytes, UA LARGE (A) NEGATIVE   RBC / HPF 0-5 0 - 5 RBC/hpf   WBC, UA 0-5 0 - 5 WBC/hpf   Bacteria, UA RARE (A) NONE SEEN   Squamous Epithelial / LPF 6-30 (A) NONE SEEN   Mucous PRESENT   Pregnancy, urine POC     Status: Abnormal   Collection Time: 05/12/16  9:45 PM  Result Value Ref Range   Preg Test, Ur POSITIVE (A) NEGATIVE  Wet prep, genital     Status: Abnormal   Collection Time: 05/12/16  9:50 PM  Result Value Ref Range   Yeast Wet Prep HPF POC NONE SEEN NONE SEEN   Trich, Wet Prep NONE SEEN NONE SEEN   Clue Cells Wet Prep HPF POC PRESENT (A) NONE SEEN   WBC, Wet Prep HPF POC MANY (A) NONE SEEN   Sperm NONE SEEN   CBC     Status: None   Collection Time: 05/12/16 10:10 PM  Result Value Ref Range   WBC 8.6 4.0 - 10.5 K/uL   RBC 4.79 3.87 - 5.11 MIL/uL   Hemoglobin 13.3 12.0 - 15.0 g/dL   HCT 27.237.6 53.636.0 - 64.446.0 %   MCV 78.5 78.0 - 100.0 fL   MCH 27.8 26.0 - 34.0 pg   MCHC 35.4 30.0 - 36.0 g/dL   RDW 03.413.4 74.211.5 - 59.515.5 %   Platelets 316 150 - 400 K/uL  hCG, quantitative, pregnancy     Status: Abnormal   Collection Time: 05/12/16 10:10 PM  Result Value Ref Range   hCG, Beta Chain, Quant, S 46 (H) <5 mIU/mL   [place] Ob Comp Less 14 Wks  Result Date: 05/12/2016 CLINICAL DATA:  Vaginal spotting for 3 days, positive urine pregnancy test EXAM: OBSTETRIC <14 WK US AND TRANSVAGINAL OB US TECHNIQUE: Both transabdominal and transvaginal ultrasound examinations were  performed for complete evaluation of the gestation as well as the maternal uterus, adnexal regions, and pelvic cul-de-sac. Transvaginal technique was performed to assess early pregnancy. COMPARISON:  03/30/2016 FINDINGS: Intrauterine gestational sac: Not visualized Yolk sac:  Not visualized Embryo:  Not visualized Cardiac Activity: Not visualized Maternal uterus/adnexae: Normal ovaries. Previously noted right complex ovarian cyst not visualized. Right ovary measures 2.7 by 2.6 x 2 cm. Left ovary measures 3.6 x 3.5 by 3.7 cm. No significant free fluid IMPRESSION: 1. No intrauterine gestational  sac, yolk sac or embryo is visualized. Recommend correlation with quantitative beta HCG and possible sonographic follow-up as findings could be related to early pregnancy with ectopic not excluded. 2. Resolution of previously noted right complex ovarian cyst. Electronically Signed   By: Jasmine Pang M.D.   On: 05/12/2016 22:39   US Ob Transvaginal  Result Date: 05/12/2016 CLINICAL DATA:  Vaginal spotting for 3 days, positive urine pregnancy test EXAM: OBSTETRIC <14 WK [place] AND TRANSVAGINAL OB US TECHNIQUE: Both transabdominal and transvaginal ultrasound examinations were performed for complete evaluation of the gestation as well as the maternal uterus, adnexal regions, and pelvic cul-de-sac. Transvaginal technique was performed to assess early pregnancy. COMPARISON:  03/30/2016 FINDINGS: Intrauterine gestational sac: Not visualized Yolk sac:  Not visualized Embryo:  Not visualized Cardiac Activity: Not visualized Maternal uterus/adnexae: Normal ovaries. Previously noted right complex ovarian cyst not visualized. Right ovary measures 2.7 by 2.6 x 2 cm. Left ovary measures 3.6 x 3.5 by 3.7 cm. No significant free fluid IMPRESSION: 1. No intrauterine gestational sac, yolk sac or embryo is visualized. Recommend correlation with quantitative beta HCG and possible sonographic follow-up as findings could be related to early  pregnancy with ectopic not excluded. 2. Resolution of previously noted right complex ovarian cyst. Electronically Signed   By: Jasmine Pang M.D.   On: 05/12/2016 22:39   MAU Course  Procedures  MDM   Assessment and Plan   1. Pregnancy, location unknown   2. Vaginal bleeding in pregnancy, first trimester    DC home Comfort measures reviewed  1st Trimester precautions  Bleeding precautions Ectopic precautions RX: none  Return to MAU as needed FU with OB as planned  Follow-up Information    [facility] Follow up.   Why:  Please return Saturday moring around 0800 for repeat blood work  Contact information: [address] [id number] mc [place] [address] [phone]           Tawnya Crook 05/12/2016, 10:02 PM

## 2016-05-12 NOTE — MAU Note (Signed)
Pt presents complaining on pink spotting when she wipes and a positive pregnancy test. States she was 4 days late for her period but she says it started on 05/02/16 and she thought it was a normal period. Monday starting spotting again. Denies pain.

## 2016-05-13 LAB — RPR: RPR: NONREACTIVE

## 2016-05-13 LAB — GC/CHLAMYDIA PROBE AMP (~~LOC~~) NOT AT ARMC
Chlamydia: NEGATIVE
Neisseria Gonorrhea: NEGATIVE

## 2016-05-13 LAB — HIV ANTIBODY (ROUTINE TESTING W REFLEX): HIV SCREEN 4TH GENERATION: NONREACTIVE

## 2016-05-15 ENCOUNTER — Inpatient Hospital Stay (HOSPITAL_COMMUNITY)
Admission: AD | Admit: 2016-05-15 | Discharge: 2016-05-15 | Disposition: A | Discharge status: Home / Self Care | Payer: Medicaid Other | Source: Ambulatory Visit | Attending: Obstetrics and Gynecology | Admitting: Obstetrics and Gynecology

## 2016-05-15 DIAGNOSIS — Z3A01 Less than 8 weeks gestation of pregnancy: Secondary | ICD-10-CM | POA: Insufficient documentation

## 2016-05-15 DIAGNOSIS — O039 Complete or unspecified spontaneous abortion without complication: Secondary | ICD-10-CM

## 2016-05-15 DIAGNOSIS — O26891 Other specified pregnancy related conditions, first trimester: Secondary | ICD-10-CM | POA: Insufficient documentation

## 2016-05-15 DIAGNOSIS — O034 Incomplete spontaneous abortion without complication: Secondary | ICD-10-CM

## 2016-05-15 DIAGNOSIS — O3680X0 Pregnancy with inconclusive fetal viability, not applicable or unspecified: Secondary | ICD-10-CM | POA: Insufficient documentation

## 2016-05-15 LAB — HCG, QUANTITATIVE, PREGNANCY: hCG, Beta Chain, Quant, S: 19 m[IU]/mL — ABNORMAL HIGH (ref <5)

## 2016-05-15 NOTE — MAU Provider Note (Signed)
History   Chief Complaint:  Follow-up RN Note: Patient presents for f/u HCG, still having pain on left side, not worse, no vaginal bleeding.   [name] is  26 y.o. G2P1001 Patient's last menstrual period was 04/07/2016 (approximate).. Patient is here for follow up of quantitative HCG and ongoing surveillance of pregnancy status.   She is [redacted]w[redacted]d weeks gestation  by LMP.    Since her last visit, the patient is without new complaint.   The patient reports bleeding as  none now.    General ROS:  negative  Her previous Quantitative HCG values are:   46  On 05/12/16     Physical Exam   Pulse 87, temperature 98.3 F (36.8 C), resp. rate 18, last menstrual period 04/07/2016.  Focused Gynecological Exam: examination not indicated  Labs: No results found for this or any previous visit (from the past 24 hour(s)).  Ultrasound Studies:   US Ob Comp Less 14 Wks  Result Date: 05/12/2016 CLINICAL DATA:  Vaginal spotting for 3 days, positive urine pregnancy test EXAM: OBSTETRIC <14 WK [place] AND TRANSVAGINAL OB US TECHNIQUE: Both transabdominal and transvaginal ultrasound examinations were performed for complete evaluation of the gestation as well as the maternal uterus, adnexal regions, and pelvic cul-de-sac. Transvaginal technique was performed to assess early pregnancy. COMPARISON:  03/30/2016 FINDINGS: Intrauterine gestational sac: Not visualized Yolk sac:  Not visualized Embryo:  Not visualized Cardiac Activity: Not visualized Maternal uterus/adnexae: Normal ovaries. Previously noted right complex ovarian cyst not visualized. Right ovary measures 2.7 by 2.6 x 2 cm. Left ovary measures 3.6 x 3.5 by 3.7 cm. No significant free fluid IMPRESSION: 1. No intrauterine gestational sac, yolk sac or embryo is visualized. Recommend correlation with quantitative beta HCG and possible sonographic follow-up as findings could be related to early pregnancy with ectopic not excluded. 2. Resolution of previously  noted right complex ovarian cyst. Electronically Signed   By: Jasmine Pang M.D.   On: 05/12/2016 22:39   US Ob Transvaginal  Result Date: 05/12/2016 CLINICAL DATA:  Vaginal spotting for 3 days, positive urine pregnancy test EXAM: OBSTETRIC <14 WK [place] AND TRANSVAGINAL OB US TECHNIQUE: Both transabdominal and transvaginal ultrasound examinations were performed for complete evaluation of the gestation as well as the maternal uterus, adnexal regions, and pelvic cul-de-sac. Transvaginal technique was performed to assess early pregnancy. COMPARISON:  03/30/2016 FINDINGS: Intrauterine gestational sac: Not visualized Yolk sac:  Not visualized Embryo:  Not visualized Cardiac Activity: Not visualized Maternal uterus/adnexae: Normal ovaries. Previously noted right complex ovarian cyst not visualized. Right ovary measures 2.7 by 2.6 x 2 cm. Left ovary measures 3.6 x 3.5 by 3.7 cm. No significant free fluid IMPRESSION: 1. No intrauterine gestational sac, yolk sac or embryo is visualized. Recommend correlation with quantitative beta HCG and possible sonographic follow-up as findings could be related to early pregnancy with ectopic not excluded. 2. Resolution of previously noted right complex ovarian cyst. Electronically Signed   By: Jasmine Pang M.D.   On: 05/12/2016 22:39    Assessment:  [redacted]w[redacted]d weeks gestation   HCG level has dropped significantly from an already low value Discussed this indicates a spontaneous abortion   Plan: The patient is instructed to follow up in in 1 week for final HCG level Patient is very tearful, initially left abruptly, then came back and asked if it was possible the baby was still ok,   Discussed this is not possible given the large drop in hormone level Will followup in clinic. Condolences  given Message sent to clinic for appt in a week  Sherry BourgeoisMarie Tonio [name] 05/15/2016, 3:01 PM

## 2016-05-15 NOTE — Discharge Instructions (Signed)

## 2016-05-15 NOTE — MAU Note (Signed)
Patient presents for f/u HCG, still having pain on left side, not worse, no vaginal bleeding.

## 2016-05-17 ENCOUNTER — Other Ambulatory Visit: Payer: Self-pay | Admitting: *Deleted

## 2016-05-17 DIAGNOSIS — O3680X0 Pregnancy with inconclusive fetal viability, not applicable or unspecified: Secondary | ICD-10-CM

## 2016-05-18 ENCOUNTER — Telehealth: Payer: Self-pay | Admitting: *Deleted

## 2016-05-18 LAB — HCG, QUANTITATIVE, PREGNANCY: hCG, Beta Chain, Quant, S: 6.1 m[IU]/mL — ABNORMAL HIGH

## 2016-05-18 NOTE — Telephone Encounter (Signed)
Pt called in to receive her lab result from yesterday. I informed her that the hormone level has dropped again which is indicative of a failed pregnancy or miscarriage. Typically we like to see it drop to less than 5 so she may need another test within a few days to a week. Pt stated that she does not want to have another test and stated. "I am just done with it. I don't want to be stuck anymore." I advised that I will speak with a provider and call her back. Pt agreed and stated that a message can be left on voice mail if she does not answer.   1510  Called pt back after consult with Dr. Erin FullingHarraway-Smith and left a message stating that no further tests are needed at this time. She may call back if she has additional questions.

## 2016-05-24 ENCOUNTER — Other Ambulatory Visit: Payer: Self-pay

## 2016-05-24 ENCOUNTER — Encounter: Payer: Self-pay | Admitting: *Deleted

## 2016-05-24 NOTE — Progress Notes (Signed)
Repeat beta hcg.

## 2016-06-04 ENCOUNTER — Encounter (HOSPITAL_COMMUNITY): Payer: Self-pay | Admitting: *Deleted

## 2016-06-04 ENCOUNTER — Inpatient Hospital Stay (HOSPITAL_COMMUNITY)
Admission: AD | Admit: 2016-06-04 | Discharge: 2016-06-04 | Disposition: A | Discharge status: Home / Self Care | Payer: Self-pay | Source: Ambulatory Visit | Attending: Obstetrics and Gynecology | Admitting: Obstetrics and Gynecology

## 2016-06-04 DIAGNOSIS — Z8719 Personal history of other diseases of the digestive system: Secondary | ICD-10-CM | POA: Insufficient documentation

## 2016-06-04 DIAGNOSIS — O99011 Anemia complicating pregnancy, first trimester: Secondary | ICD-10-CM | POA: Insufficient documentation

## 2016-06-04 DIAGNOSIS — O039 Complete or unspecified spontaneous abortion without complication: Secondary | ICD-10-CM

## 2016-06-04 DIAGNOSIS — R42 Dizziness and giddiness: Secondary | ICD-10-CM | POA: Insufficient documentation

## 2016-06-04 DIAGNOSIS — O26891 Other specified pregnancy related conditions, first trimester: Secondary | ICD-10-CM | POA: Insufficient documentation

## 2016-06-04 DIAGNOSIS — O210 Mild hyperemesis gravidarum: Secondary | ICD-10-CM | POA: Insufficient documentation

## 2016-06-04 DIAGNOSIS — O3481 Maternal care for other abnormalities of pelvic organs, first trimester: Secondary | ICD-10-CM | POA: Insufficient documentation

## 2016-06-04 DIAGNOSIS — Z3A08 8 weeks gestation of pregnancy: Secondary | ICD-10-CM | POA: Insufficient documentation

## 2016-06-04 DIAGNOSIS — N83209 Unspecified ovarian cyst, unspecified side: Secondary | ICD-10-CM | POA: Insufficient documentation

## 2016-06-04 DIAGNOSIS — H8149 Vertigo of central origin, unspecified ear: Secondary | ICD-10-CM

## 2016-06-04 DIAGNOSIS — R110 Nausea: Secondary | ICD-10-CM

## 2016-06-04 DIAGNOSIS — D573 Sickle-cell trait: Secondary | ICD-10-CM | POA: Insufficient documentation

## 2016-06-04 LAB — CBC WITH DIFFERENTIAL/PLATELET
BASOS PCT: 0 %
Basophils Absolute: 0 10*3/uL (ref 0.0–0.1)
EOS ABS: 0 10*3/uL (ref 0.0–0.7)
EOS PCT: 1 %
HCT: 36.2 % (ref 36.0–46.0)
HEMOGLOBIN: 12.7 g/dL (ref 12.0–15.0)
Lymphocytes Relative: 25 %
Lymphs Abs: 2.2 10*3/uL (ref 0.7–4.0)
MCH: 27.5 pg (ref 26.0–34.0)
MCHC: 35.1 g/dL (ref 30.0–36.0)
MCV: 78.5 fL (ref 78.0–100.0)
MONO ABS: 0.4 10*3/uL (ref 0.1–1.0)
Monocytes Relative: 4 %
NEUTROS PCT: 70 %
Neutro Abs: 6.2 10*3/uL (ref 1.7–7.7)
PLATELETS: 252 10*3/uL (ref 150–400)
RBC: 4.61 MIL/uL (ref 3.87–5.11)
RDW: 13.3 % (ref 11.5–15.5)
WBC: 8.9 10*3/uL (ref 4.0–10.5)

## 2016-06-04 LAB — URINALYSIS, ROUTINE W REFLEX MICROSCOPIC
BILIRUBIN URINE: NEGATIVE
Glucose, UA: NEGATIVE mg/dL
HGB URINE DIPSTICK: NEGATIVE
KETONES UR: NEGATIVE mg/dL
NITRITE: NEGATIVE
Protein, ur: NEGATIVE mg/dL
Specific Gravity, Urine: 1.014 (ref 1.005–1.030)
pH: 6 (ref 5.0–8.0)

## 2016-06-04 LAB — POCT PREGNANCY, URINE: PREG TEST UR: NEGATIVE

## 2016-06-04 LAB — HCG, QUANTITATIVE, PREGNANCY

## 2016-06-04 MED ORDER — ONDANSETRON HCL 4 MG PO TABS
4.0000 mg | ORAL_TABLET | Freq: Once | ORAL | Status: AC
  Administered 2016-06-04: 4 mg via ORAL
  Filled 2016-06-04: qty 1

## 2016-06-04 MED ORDER — PROMETHAZINE HCL 25 MG PO TABS: 25.0000 mg | ORAL_TABLET | Freq: Four times a day (QID) | ORAL | 2 refills | Status: DC | PRN

## 2016-06-04 NOTE — MAU Note (Addendum)
Having a lot of nausea. Blurry vision and lightheaded. Symptoms present for couple days. Denies vag bleeding or d/c. Does not have any meds for nausea. Had BHCG 1/22 and was told 1/23 test was <5. Is unsure if is pregnant or not. Had spotting on 1/31

## 2016-06-04 NOTE — MAU Provider Note (Signed)
Chief Complaint: Morning Sickness and Dizziness   First Provider Initiated Contact with Patient 06/04/16 2040        SUBJECTIVE HPI: [name] is a 26 y.o. G2P1001 at [redacted]w[redacted]d by LMP who presents to maternity admissions reporting nausea and spotting.  Had a miscarriage recently with last HCG level being 6.1 on 05/17/16.   Did have intercourse on 05/25/16, unprotected. Spotted a couple of days later. No menses since SAB.  She denies vaginal bleeding, vaginal itching/burning, urinary symptoms, h/a, dizziness, vomiting, or fever/chills.    Dizziness  This is a new problem. The current episode started in the past 7 days. The problem occurs intermittently. The problem has been waxing and waning. Associated symptoms include nausea. Pertinent negatives include no abdominal pain, chills, congestion, fever, headaches, myalgias or vomiting. Nothing aggravates the symptoms. She has tried nothing for the symptoms.  Emesis   This is a new problem. The current episode started in the past 7 days. Episode frequency: no vomiting. There has been no fever. Associated symptoms include dizziness. Pertinent negatives include no abdominal pain, chills, fever, headaches or myalgias. She has tried nothing for the symptoms.    Past Medical History:  Diagnosis Date  . Constipation   . Gastric ulcer   . History of dysmenorrhea   . Hypoglycemia   . Ovarian cyst 2011  . Sickle cell trait [facility])    Past Surgical History:  Procedure Laterality Date  . EYE SURGERY     2004  . LAPAROSCOPIC OVARIAN CYSTECTOMY  2011  . LAPAROTOMY N/A 06/16/2012   Procedure: EXPLORATORY LAPAROTOMY;  Surgeon: Emelia Loron, MD;  Location: [facility] OR;  Service: General;  Laterality: N/A;  Repair of duodenal ulcer  . REPAIR OF PERFORATED ULCER N/A 06/16/2012   Procedure: REPAIR OF PERFORATED ULCER;  Surgeon: Emelia Loron, MD;  Location: [facility] OR;  Service: General;  Laterality: N/A;  duodenal   Social History   Social History  .  Marital status: Single    Spouse name: N/A  . Number of children: N/A  . Years of education: N/A   Occupational History  . Not on file.   Social History Main Topics  . Smoking status: Never Smoker  . Smokeless tobacco: Never Used  . Alcohol use No  . Drug use: No  . Sexual activity: Yes    Partners: Male    Birth control/ protection: None   Other Topics Concern  . Not on file   Social History Narrative   ** Merged History Encounter **       No current facility-administered medications on file prior to encounter.    Current Outpatient Prescriptions on File Prior to Encounter  Medication Sig Dispense Refill  . HYDROcodone-acetaminophen (NORCO) 5-325 MG tablet Take 1-2 tablets by mouth every 6 (six) hours as needed. 15 tablet 0  . Ibuprofen-Diphenhydramine HCl (ADVIL PM) 200-25 MG CAPS Take 2 tablets by mouth at bedtime as needed (pain/sleep.).    Marland Kitchen metroNIDAZOLE (FLAGYL) 500 MG tablet Take 1 tablet (500 mg total) by mouth 2 (two) times daily. (Patient not taking: Reported on 03/30/2016) 14 tablet 0  . penicillin v potassium (VEETID) 500 MG tablet Take 1 tablet (500 mg total) by mouth 4 (four) times daily. (Patient not taking: Reported on 03/30/2016) 40 tablet 0  . promethazine (PHENERGAN) 25 MG tablet Take 0.5 tablets (12.5 mg total) by mouth every 6 (six) hours as needed for nausea. 20 tablet 0   Allergies  Allergen Reactions  . Nsaids  Diagnosed with a perforated duodenal ulcer due to NSAIDS  . Nsaids     I have reviewed patient's Past Medical Hx, Surgical Hx, Family Hx, Social Hx, medications and allergies.   ROS:  Review of Systems  Constitutional: Negative for chills and fever.  HENT: Negative for congestion.   Gastrointestinal: Positive for nausea. Negative for abdominal pain and vomiting.  Musculoskeletal: Negative for myalgias.  Neurological: Positive for dizziness. Negative for headaches.   Review of Systems  Other systems negative   Physical Exam   Physical Exam Patient Vitals for the past 24 hrs:  BP Temp Pulse Resp Height Weight  06/04/16 2026 134/83 98.4 F (36.9 C) 80 18 5\' 3"  (1.6 m) 173 lb (78.5 kg)   Constitutional: Well-developed, well-nourished female in no acute distress.  Cardiovascular: normal rate and rhythm, no ectopic beats Respiratory: normal effort, lungs clear bilaterally GI: Abd soft, non-tender. Pos BS x 4 MS: Extremities nontender, no edema, normal ROM Neurologic: Alert and oriented x 4.  GU: Neg CVAT.  PELVIC EXAM: Deferred  LAB RESULTS     IMAGING [place] Ob Comp Less 14 Wks  Result Date: 05/12/2016 CLINICAL DATA:  Vaginal spotting for 3 days, positive urine pregnancy test EXAM: OBSTETRIC <14 WK US AND TRANSVAGINAL OB US TECHNIQUE: Both transabdominal and transvaginal ultrasound examinations were performed for complete evaluation of the gestation as well as the maternal uterus, adnexal regions, and pelvic cul-de-sac. Transvaginal technique was performed to assess early pregnancy. COMPARISON:  03/30/2016 FINDINGS: Intrauterine gestational sac: Not visualized Yolk sac:  Not visualized Embryo:  Not visualized Cardiac Activity: Not visualized Maternal uterus/adnexae: Normal ovaries. Previously noted right complex ovarian cyst not visualized. Right ovary measures 2.7 by 2.6 x 2 cm. Left ovary measures 3.6 x 3.5 by 3.7 cm. No significant free fluid IMPRESSION: 1. No intrauterine gestational sac, yolk sac or embryo is visualized. Recommend correlation with quantitative beta HCG and possible sonographic follow-up as findings could be related to early pregnancy with ectopic not excluded. 2. Resolution of previously noted right complex ovarian cyst. Electronically Signed   By: Jasmine PangKim  Fujinaga M.D.   On: 05/12/2016 22:39   [place] Ob Transvaginal  Result Date: 05/12/2016 CLINICAL DATA:  Vaginal spotting for 3 days, positive urine pregnancy test EXAM: OBSTETRIC <14 WK US AND TRANSVAGINAL OB US TECHNIQUE: Both transabdominal and  transvaginal ultrasound examinations were performed for complete evaluation of the gestation as well as the maternal uterus, adnexal regions, and pelvic cul-de-sac. Transvaginal technique was performed to assess early pregnancy. COMPARISON:  03/30/2016 FINDINGS: Intrauterine gestational sac: Not visualized Yolk sac:  Not visualized Embryo:  Not visualized Cardiac Activity: Not visualized Maternal uterus/adnexae: Normal ovaries. Previously noted right complex ovarian cyst not visualized. Right ovary measures 2.7 by 2.6 x 2 cm. Left ovary measures 3.6 x 3.5 by 3.7 cm. No significant free fluid IMPRESSION: 1. No intrauterine gestational sac, yolk sac or embryo is visualized. Recommend correlation with quantitative beta HCG and possible sonographic follow-up as findings could be related to early pregnancy with ectopic not excluded. 2. Resolution of previously noted right complex ovarian cyst. Electronically Signed   By: Jasmine PangKim  Fujinaga M.D.   On: 05/12/2016 22:39    MAU Management/MDM: HCG level drawn to determine if she is pregnant or not Zofran given for nausea since she is driving. Felt only a little better after zofran. HCG is <1 Discussed nausea is likely GI virus or other non-pregnancy related source.  ASSESSMENT Nausea without vomiting - Plan: Discharge patient   PLAN  Discharge home Rx Phenergan for nausea,  Discussed other symptoms of GI virus. Follow up with family doctor or urgent care if worsens over time. Discussed there may be a delay in return of menses.  Urged to use protection until she recovers. Then decide if she wants to pursue another pregnancy F/U in clinic for post SAB visit  Pt stable at time of discharge. Encouraged to return here or to other Urgent Care/ED if she develops worsening of symptoms, increase in pain, fever, or other concerning symptoms.    [name] CNM, MSN Certified Nurse-Midwife 06/04/2016  9:01 PM

## 2016-06-04 NOTE — Discharge Instructions (Signed)
Nausea, Adult  Nausea is the feeling of an upset stomach or having to vomit. Nausea on its own is not usually a serious concern, but it may be an early sign of a more serious medical problem. As nausea gets worse, it can lead to vomiting. If vomiting develops, or if you are not able to drink enough fluids, you are at risk of becoming dehydrated. Dehydration can make you tired and thirsty, cause you to have a dry mouth, and decrease how often you urinate. Older adults and people with other diseases or a weak immune system are at higher risk for dehydration. The main goals of treating your nausea are:  · To limit repeated nausea episodes.  · To prevent vomiting and dehydration.    Follow these instructions at home:  Follow instructions from your health care provider about how to care for yourself at home.  Eating and drinking   Follow these recommendations as told by your health care provider:  · Take an oral rehydration solution (ORS). This is a drink that is sold at pharmacies and retail stores.  · Drink clear fluids in small amounts as you are able. Clear fluids include water, ice chips, diluted fruit juice, and low-calorie sports drinks.  · Eat bland, easy-to-digest foods in small amounts as you are able. These foods include bananas, applesauce, rice, lean meats, toast, and crackers.  · Avoid drinking fluids that contain a lot of sugar or caffeine, such as energy drinks, sports drinks, and soda.  · Avoid alcohol.  · Avoid spicy or fatty foods.    General instructions   · Drink enough fluid to keep your urine clear or pale yellow.  · Wash your hands often. If soap and water are not available, use hand sanitizer.  · Make sure that all people in your household wash their hands well and often.  · Rest at home while you recover.  · Take over-the-counter and prescription medicines only as told by your health care provider.  · Breathe slowly and deeply when you feel nauseous.  · Watch your condition for any  changes.  · Keep all follow-up visits as told by your health care provider. This is important.  Contact a health care provider if:  · You have a headache.  · You have new symptoms.  · Your nausea gets worse.  · You have a fever.  · You feel light-headed or dizzy.  · You vomit.  · You cannot keep fluids down.  Get help right away if:  · You have pain in your chest, neck, arm, or jaw.  · You feel extremely weak or you faint.  · You have vomit that is bright red or looks like coffee grounds.  · You have bloody or black stools or stools that look like tar.  · You have a severe headache, a stiff neck, or both.  · You have severe pain, cramping, or bloating in your abdomen.  · You have a rash.  · You have difficulty breathing or are breathing very quickly.  · Your heart is beating very quickly.  · Your skin feels cold and clammy.  · You feel confused.  · You have pain when you urinate.  · You have signs of dehydration, such as:  ? Dark urine, very little, or no urine.  ? Cracked lips.  ? Dry mouth.  ? Sunken eyes.  ? Sleepiness.  ? Weakness.  These symptoms may represent a serious problem that is an emergency. Do   not wait to see if the symptoms will go away. Get medical help right away. Call your local emergency services (911 in the [place] Do not drive yourself to the hospital.  This information is not intended to replace advice given to you by your health care provider. Make sure you discuss any questions you have with your health care provider.  Document Released: 05/20/2004 Document Revised: 09/15/2015 Document Reviewed: 12/17/2014  Elsevier Interactive Patient Education © 2017 [employer]

## 2016-08-04 ENCOUNTER — Inpatient Hospital Stay (HOSPITAL_COMMUNITY)
Admission: AD | Admit: 2016-08-04 | Discharge: 2016-08-04 | Disposition: A | Discharge status: Home / Self Care | Payer: Medicaid Other | Source: Ambulatory Visit | Attending: Obstetrics & Gynecology | Admitting: Obstetrics & Gynecology

## 2016-08-04 ENCOUNTER — Encounter: Payer: Self-pay | Admitting: Obstetrics and Gynecology

## 2016-08-04 DIAGNOSIS — O219 Vomiting of pregnancy, unspecified: Secondary | ICD-10-CM | POA: Insufficient documentation

## 2016-08-04 DIAGNOSIS — Z3201 Encounter for pregnancy test, result positive: Secondary | ICD-10-CM

## 2016-08-04 DIAGNOSIS — R112 Nausea with vomiting, unspecified: Secondary | ICD-10-CM | POA: Diagnosis present

## 2016-08-04 DIAGNOSIS — Z3A00 Weeks of gestation of pregnancy not specified: Secondary | ICD-10-CM | POA: Insufficient documentation

## 2016-08-04 LAB — URINALYSIS, ROUTINE W REFLEX MICROSCOPIC
BILIRUBIN URINE: NEGATIVE
Bacteria, UA: NONE SEEN
GLUCOSE, UA: NEGATIVE mg/dL
Hgb urine dipstick: NEGATIVE
KETONES UR: 5 mg/dL — AB
Nitrite: NEGATIVE
PROTEIN: NEGATIVE mg/dL
Specific Gravity, Urine: 1.013 (ref 1.005–1.030)
pH: 5 (ref 5.0–8.0)

## 2016-08-04 LAB — POCT PREGNANCY, URINE: Preg Test, Ur: POSITIVE — AB

## 2016-08-04 NOTE — MAU Provider Note (Signed)
S:  [name] is a 26 y.o. female G2P1001 here in MAU nausea. She had 3 positive pregnancy tests at home. She has had constant nausea, no vomiting. She denies abdominal pain or vaginal bleeding.    O:  GENERAL: Well-developed, well-nourished female in no acute distress.  LUNGS: Effort normal SKIN: Warm, dry and without erythema PSYCH: Normal mood and affect  Vitals:   08/04/16 1920  BP: 126/75  Pulse: 95  Resp: 20  Temp: 98.3 F (36.8 C)    MDM:  Urine pregnancy test positive    A:  1. Nausea and vomiting in pregnancy   2. Encounter for pregnancy test, result positive     P:  Discharge home in stable condition Pregnancy verification letter given  Vitamin B6 and unisom recommended  Return to MAU for emergencies only List of OB's given  Duane Lope, NP 08/04/2016 8:15 PM

## 2016-08-04 NOTE — MAU Note (Signed)
Pt states she had 3 +upt at home. LMP: 07/12/2016. States she had a miscarriage in January. Pt denies pain or vaginal bleeding. Pt states that she feels nauseous all the time but denies vomiting.

## 2016-08-04 NOTE — Discharge Instructions (Signed)

## 2016-08-17 ENCOUNTER — Inpatient Hospital Stay (HOSPITAL_COMMUNITY)
Admission: AD | Admit: 2016-08-17 | Discharge: 2016-08-17 | Disposition: A | Discharge status: Home / Self Care | Payer: Medicaid Other | Source: Ambulatory Visit | Attending: Family Medicine | Admitting: Family Medicine

## 2016-08-17 ENCOUNTER — Encounter (HOSPITAL_COMMUNITY): Payer: Self-pay

## 2016-08-17 ENCOUNTER — Inpatient Hospital Stay (HOSPITAL_COMMUNITY): Payer: Medicaid Other

## 2016-08-17 DIAGNOSIS — D573 Sickle-cell trait: Secondary | ICD-10-CM | POA: Diagnosis not present

## 2016-08-17 DIAGNOSIS — O09291 Supervision of pregnancy with other poor reproductive or obstetric history, first trimester: Secondary | ICD-10-CM | POA: Diagnosis not present

## 2016-08-17 DIAGNOSIS — O99011 Anemia complicating pregnancy, first trimester: Secondary | ICD-10-CM | POA: Insufficient documentation

## 2016-08-17 DIAGNOSIS — K5900 Constipation, unspecified: Secondary | ICD-10-CM

## 2016-08-17 DIAGNOSIS — Z3491 Encounter for supervision of normal pregnancy, unspecified, first trimester: Secondary | ICD-10-CM

## 2016-08-17 DIAGNOSIS — O99612 Diseases of the digestive system complicating pregnancy, second trimester: Secondary | ICD-10-CM | POA: Diagnosis not present

## 2016-08-17 DIAGNOSIS — Z3A01 Less than 8 weeks gestation of pregnancy: Secondary | ICD-10-CM | POA: Diagnosis not present

## 2016-08-17 DIAGNOSIS — O219 Vomiting of pregnancy, unspecified: Secondary | ICD-10-CM | POA: Insufficient documentation

## 2016-08-17 DIAGNOSIS — R1032 Left lower quadrant pain: Secondary | ICD-10-CM | POA: Diagnosis present

## 2016-08-17 DIAGNOSIS — O209 Hemorrhage in early pregnancy, unspecified: Secondary | ICD-10-CM | POA: Diagnosis not present

## 2016-08-17 DIAGNOSIS — R109 Unspecified abdominal pain: Secondary | ICD-10-CM

## 2016-08-17 DIAGNOSIS — O99611 Diseases of the digestive system complicating pregnancy, first trimester: Secondary | ICD-10-CM

## 2016-08-17 DIAGNOSIS — O26891 Other specified pregnancy related conditions, first trimester: Secondary | ICD-10-CM

## 2016-08-17 LAB — URINALYSIS, ROUTINE W REFLEX MICROSCOPIC
Bilirubin Urine: NEGATIVE
GLUCOSE, UA: NEGATIVE mg/dL
Hgb urine dipstick: NEGATIVE
Ketones, ur: NEGATIVE mg/dL
NITRITE: NEGATIVE
PROTEIN: NEGATIVE mg/dL
Specific Gravity, Urine: 1.013 (ref 1.005–1.030)
pH: 5 (ref 5.0–8.0)

## 2016-08-17 LAB — WET PREP, GENITAL
SPERM: NONE SEEN
TRICH WET PREP: NONE SEEN
YEAST WET PREP: NONE SEEN

## 2016-08-17 LAB — CBC
HEMATOCRIT: 37 % (ref 36.0–46.0)
HEMOGLOBIN: 12.9 g/dL (ref 12.0–15.0)
MCH: 27.6 pg (ref 26.0–34.0)
MCHC: 34.9 g/dL (ref 30.0–36.0)
MCV: 79.1 fL (ref 78.0–100.0)
Platelets: 305 10*3/uL (ref 150–400)
RBC: 4.68 MIL/uL (ref 3.87–5.11)
RDW: 13.5 % (ref 11.5–15.5)
WBC: 9.3 10*3/uL (ref 4.0–10.5)

## 2016-08-17 LAB — HCG, QUANTITATIVE, PREGNANCY: hCG, Beta Chain, Quant, S: 15897 m[IU]/mL — ABNORMAL HIGH (ref <5)

## 2016-08-17 MED ORDER — DOCUSATE SODIUM 100 MG PO CAPS: 100.0000 mg | ORAL_CAPSULE | Freq: Two times a day (BID) | ORAL | 2 refills | Status: DC | PRN

## 2016-08-17 MED ORDER — PROMETHAZINE HCL 25 MG PO TABS: 12.5000 mg | ORAL_TABLET | Freq: Four times a day (QID) | ORAL | 2 refills | Status: DC | PRN

## 2016-08-17 MED ORDER — POLYETHYLENE GLYCOL 3350 17 GM/SCOOP PO POWD: 17.0000 g | Freq: Every day | ORAL | 0 refills | Status: DC

## 2016-08-17 NOTE — MAU Note (Signed)
Patient presents with intermittent LLQ pain she rates 6/10.  Also having nausea and vomiting for the past week. Vomited last at 10:30pm.  Denies diarrhea or fever.  Denies vaginal bleeding or discharge.

## 2016-08-17 NOTE — Discharge Instructions (Signed)
[facility]  ° ° °[facility]       Phone: [phone] ° °[facility] Phone: [phone] ° °[facility]  Phone: [phone] ° °[facility] [place] [facility]  Phone: [phone] ° °[facility]  Phone: [phone] ° °[facility]       Phone: [phone] ° °[facility]    Phone: [phone]  ° °[facility]    Phone: [phone] ° °[facility]    Phone: [phone] ° °[facility]    Phone: [phone] ° °[facility]    Phone: [phone] ° °[facility]       Phone: [phone]  ° °[facility]  Phone: [phone] ° °[facility]    Phone: [phone] ° °[facility]   Phone: [phone] ° °[facility]      Phone: [phone] ° °[facility]    Phone: [phone] ° °

## 2016-08-17 NOTE — MAU Provider Note (Signed)
Chief Complaint: Nausea and Emesis   First Provider Initiated Contact with Patient 08/17/16 1049      SUBJECTIVE HPI: [name] is a 26 y.o. G3P1011 at [redacted]w[redacted]d by LMP who presents to maternity admissions reporting cramping in her lower abdomen, more on left lower side x 3-4 days and nausea daily x 3-4 days with vomiting x 1 in 24 hours. She describes the pain as cramping intermittent pain, nothing makes it better or worse, that is unchanged since onset.  It is associated with nausea with occasional vomiting. Smells and food make the nausea worse, nothing makes it better. She also reports it has been 5 days since she has had a bowel movement and this is unusual for her.  She has a recent hx of miscarriage early this year and is worried about this.  She denies vaginal bleeding, vaginal itching/burning, urinary symptoms, h/a, dizziness, or fever/chills.     HPI  Past Medical History:  Diagnosis Date  . Constipation   . Gastric ulcer   . History of dysmenorrhea   . Hypoglycemia   . Ovarian cyst 2011  . Sickle cell trait [facility])    Past Surgical History:  Procedure Laterality Date  . EYE SURGERY     2004  . LAPAROSCOPIC OVARIAN CYSTECTOMY  2011  . LAPAROTOMY N/A 06/16/2012   Procedure: EXPLORATORY LAPAROTOMY;  Surgeon: Emelia Loron, MD;  Location: [facility] OR;  Service: General;  Laterality: N/A;  Repair of duodenal ulcer  . REPAIR OF PERFORATED ULCER N/A 06/16/2012   Procedure: REPAIR OF PERFORATED ULCER;  Surgeon: Emelia Loron, MD;  Location: [facility] OR;  Service: General;  Laterality: N/A;  duodenal   Social History   Social History  . Marital status: Single    Spouse name: N/A  . Number of children: N/A  . Years of education: N/A   Occupational History  . Not on file.   Social History Main Topics  . Smoking status: Never Smoker  . Smokeless tobacco: Never Used  . Alcohol use No  . Drug use: No  . Sexual activity: Yes    Partners: Male    Birth control/ protection: None    Other Topics Concern  . Not on file   Social History Narrative   ** Merged History Encounter **       No current facility-administered medications on file prior to encounter.    Current Outpatient Prescriptions on File Prior to Encounter  Medication Sig Dispense Refill  . HYDROcodone-acetaminophen (NORCO) 5-325 MG tablet Take 1-2 tablets by mouth every 6 (six) hours as needed. (Patient not taking: Reported on 06/04/2016) 15 tablet 0   Allergies  Allergen Reactions  . Nsaids     Diagnosed with a perforated duodenal ulcer due to NSAIDS    ROS:  Review of Systems  Constitutional: Negative for chills, fatigue and fever.  Respiratory: Negative for shortness of breath.   Cardiovascular: Negative for chest pain.  Gastrointestinal: Positive for abdominal pain, constipation, nausea and vomiting.  Genitourinary: Positive for pelvic pain. Negative for difficulty urinating, dysuria, flank pain, vaginal bleeding, vaginal discharge and vaginal pain.  Neurological: Negative for dizziness and headaches.  Psychiatric/Behavioral: Negative.      I have reviewed patient's Past Medical Hx, Surgical Hx, Family Hx, Social Hx, medications and allergies.   Physical Exam   Patient Vitals for the past 24 hrs:  BP Temp Temp src Pulse Resp SpO2 Weight  08/17/16 1337 133/77 98.1 F (36.7 C) Oral 85 17 99 % -  08/17/16 1024 124/67 98.1 F (36.7 C) Oral 84 18 99 % 180 lb 12.8 oz (82 kg)  08/17/16 1023 - - - - - 100 % -   Constitutional: Well-developed, well-nourished female in no acute distress.  Cardiovascular: normal rate Respiratory: normal effort GI: Abd soft, non-tender. Pos BS x 4 MS: Extremities nontender, no edema, normal ROM Neurologic: Alert and oriented x 4.  GU: Neg CVAT.  PELVIC EXAM: Cervix pink, visually closed, without lesion, scant white creamy discharge, vaginal walls and external genitalia normal Bimanual exam: Cervix 0/long/high, firm, anterior, neg CMT, uterus nontender,  nonenlarged, adnexa without tenderness, enlargement, or mass   LAB RESULTS Results for orders placed or performed during the hospital encounter of 08/17/16 (from the past 24 hour(s))  Urinalysis, Routine w reflex microscopic     Status: Abnormal   Collection Time: 08/17/16 10:15 AM  Result Value Ref Range   Color, Urine YELLOW YELLOW   APPearance CLEAR CLEAR   Specific Gravity, Urine 1.013 1.005 - 1.030   pH 5.0 5.0 - 8.0   Glucose, UA NEGATIVE NEGATIVE mg/dL   Hgb urine dipstick NEGATIVE NEGATIVE   Bilirubin Urine NEGATIVE NEGATIVE   Ketones, ur NEGATIVE NEGATIVE mg/dL   Protein, ur NEGATIVE NEGATIVE mg/dL   Nitrite NEGATIVE NEGATIVE   Leukocytes, UA LARGE (A) NEGATIVE   RBC / HPF 0-5 0 - 5 RBC/hpf   WBC, UA 6-30 0 - 5 WBC/hpf   Bacteria, UA RARE (A) NONE SEEN   Squamous Epithelial / LPF 0-5 (A) NONE SEEN   Mucous PRESENT   CBC     Status: None   Collection Time: 08/17/16 11:07 AM  Result Value Ref Range   WBC 9.3 4.0 - 10.5 K/uL   RBC 4.68 3.87 - 5.11 MIL/uL   Hemoglobin 12.9 12.0 - 15.0 g/dL   HCT 16.1 09.6 - 04.5 %   MCV 79.1 78.0 - 100.0 fL   MCH 27.6 26.0 - 34.0 pg   MCHC 34.9 30.0 - 36.0 g/dL   RDW 40.9 81.1 - 91.4 %   Platelets 305 150 - 400 K/uL  hCG, quantitative, pregnancy     Status: Abnormal   Collection Time: 08/17/16 11:07 AM  Result Value Ref Range   hCG, Beta Chain, Quant, S 15,897 (H) <5 mIU/mL  Wet prep, genital     Status: Abnormal   Collection Time: 08/17/16 11:10 AM  Result Value Ref Range   Yeast Wet Prep HPF POC NONE SEEN NONE SEEN   Trich, Wet Prep NONE SEEN NONE SEEN   Clue Cells Wet Prep HPF POC PRESENT (A) NONE SEEN   WBC, Wet Prep HPF POC MANY (A) NONE SEEN   Sperm NONE SEEN        IMAGING US Ob Comp Less 14 Wks  Result Date: 08/17/2016 CLINICAL DATA:  Left lower quadrant pain for 1 week. Spontaneous abortion in January. EXAM: OBSTETRIC <14 WK [place] AND TRANSVAGINAL OB US TECHNIQUE: Both transabdominal and transvaginal ultrasound  examinations were performed for complete evaluation of the gestation as well as the maternal uterus, adnexal regions, and pelvic cul-de-sac. Transvaginal technique was performed to assess early pregnancy. COMPARISON:  None. FINDINGS: Intrauterine gestational sac: Single Yolk sac:  Visualized Embryo:  Visualized Cardiac Activity: Not definitively visualized currently Heart Rate:   bpm MSD:   mm    w     d CRL:  2.8  mm   5 w   5 d  [place] EDC: 04/14/2017 Subchorionic hemorrhage:  None visualized. Maternal uterus/adnexae: 4.4 cm cyst in the left ovary which appears simple. No free fluid. IMPRESSION: Five week 5 day intrauterine pregnancy. Fetal heart tones not definitively detected currently. This could be followed with repeat ultrasound in 14 days to ensure expected progression. 4.4 cm simple appearing left ovarian cyst. Electronically Signed   By: Charlett Nose M.D.   On: 08/17/2016 13:00   US Ob Transvaginal  Result Date: 08/17/2016 CLINICAL DATA:  Left lower quadrant pain for 1 week. Spontaneous abortion in January. EXAM: OBSTETRIC <14 WK [place] AND TRANSVAGINAL OB US TECHNIQUE: Both transabdominal and transvaginal ultrasound examinations were performed for complete evaluation of the gestation as well as the maternal uterus, adnexal regions, and pelvic cul-de-sac. Transvaginal technique was performed to assess early pregnancy. COMPARISON:  None. FINDINGS: Intrauterine gestational sac: Single Yolk sac:  Visualized Embryo:  Visualized Cardiac Activity: Not definitively visualized currently Heart Rate:   bpm MSD:   mm    w     d CRL:  2.8  mm   5 w   5 d                  [place] EDC: 04/14/2017 Subchorionic hemorrhage:  None visualized. Maternal uterus/adnexae: 4.4 cm cyst in the left ovary which appears simple. No free fluid. IMPRESSION: Five week 5 day intrauterine pregnancy. Fetal heart tones not definitively detected currently. This could be followed with repeat ultrasound in 14 days to ensure expected  progression. 4.4 cm simple appearing left ovarian cyst. Electronically Signed   By: Charlett Nose M.D.   On: 08/17/2016 13:00    MAU Management/MDM: CBC Quant hcg U/A Wet prep/GCC OB US Reviewed lab and [place] results.  IUP on today's [place], with FP but no FHR yet, c/w early pregnancy.  Pt to f/u with early prenatal care, list given but pt desires care in [facility], and to see me in the office if possible, so message sent to establish care.  Will treat n/v with Phenergan Rx, and Rx for Colace and Miralax to treat constipation.  Return to MAU with emergencies. Pt stable at time of discharge.  ASSESSMENT 1. Normal IUP (intrauterine pregnancy) on prenatal ultrasound, first trimester   2. Abdominal pain during pregnancy, first trimester   3. Abdominal pain during pregnancy in first trimester   4. Nausea and vomiting during pregnancy prior to [redacted] weeks gestation   5. Constipation during pregnancy in first trimester     PLAN Discharge home Allergies as of 08/17/2016      Reactions   Nsaids    Diagnosed with a perforated duodenal ulcer due to NSAIDS      Medication List    STOP taking these medications   HYDROcodone-acetaminophen 5-325 MG tablet Commonly known as:  NORCO     TAKE these medications   acetaminophen 325 MG tablet Commonly known as:  TYLENOL Take 325 mg by mouth every 6 (six) hours as needed for moderate pain.   docusate sodium 100 MG capsule Commonly known as:  COLACE Take 1 capsule (100 mg total) by mouth 2 (two) times daily as needed.   polyethylene glycol powder powder Commonly known as:  GLYCOLAX/MIRALAX Take 17 g by mouth daily.   promethazine 25 MG tablet Commonly known as:  PHENERGAN Take 0.5-1 tablets (12.5-25 mg total) by mouth every 6 (six) hours as needed.      Follow-up Information    Prenatal provider of your choice Follow up.  Why:  See list provided, return to MAU as needed for emergencies          Sharen Counter Certified  Nurse-Midwife 08/17/2016  1:46 PM

## 2016-08-18 LAB — GC/CHLAMYDIA PROBE AMP (~~LOC~~) NOT AT ARMC
CHLAMYDIA, DNA PROBE: NEGATIVE
NEISSERIA GONORRHEA: NEGATIVE

## 2016-08-18 LAB — CULTURE, OB URINE: Culture: NO GROWTH

## 2016-08-18 LAB — HIV ANTIBODY (ROUTINE TESTING W REFLEX): HIV SCREEN 4TH GENERATION: NONREACTIVE

## 2016-09-13 ENCOUNTER — Inpatient Hospital Stay (HOSPITAL_COMMUNITY): Payer: Medicaid Other

## 2016-09-13 ENCOUNTER — Encounter (HOSPITAL_COMMUNITY): Payer: Self-pay | Admitting: *Deleted

## 2016-09-13 ENCOUNTER — Inpatient Hospital Stay (HOSPITAL_COMMUNITY)
Admission: AD | Admit: 2016-09-13 | Discharge: 2016-09-14 | Disposition: A | Discharge status: Home / Self Care | Payer: Medicaid Other | Source: Ambulatory Visit | Attending: Obstetrics & Gynecology | Admitting: Obstetrics & Gynecology

## 2016-09-13 DIAGNOSIS — O23591 Infection of other part of genital tract in pregnancy, first trimester: Secondary | ICD-10-CM | POA: Insufficient documentation

## 2016-09-13 DIAGNOSIS — Z8719 Personal history of other diseases of the digestive system: Secondary | ICD-10-CM | POA: Diagnosis not present

## 2016-09-13 DIAGNOSIS — N83202 Unspecified ovarian cyst, left side: Secondary | ICD-10-CM | POA: Diagnosis not present

## 2016-09-13 DIAGNOSIS — K529 Noninfective gastroenteritis and colitis, unspecified: Secondary | ICD-10-CM | POA: Diagnosis not present

## 2016-09-13 DIAGNOSIS — O3482 Maternal care for other abnormalities of pelvic organs, second trimester: Secondary | ICD-10-CM | POA: Diagnosis not present

## 2016-09-13 DIAGNOSIS — N8312 Corpus luteum cyst of left ovary: Secondary | ICD-10-CM

## 2016-09-13 DIAGNOSIS — O26831 Pregnancy related renal disease, first trimester: Secondary | ICD-10-CM | POA: Diagnosis not present

## 2016-09-13 DIAGNOSIS — R1031 Right lower quadrant pain: Secondary | ICD-10-CM | POA: Insufficient documentation

## 2016-09-13 DIAGNOSIS — O99011 Anemia complicating pregnancy, first trimester: Secondary | ICD-10-CM | POA: Insufficient documentation

## 2016-09-13 DIAGNOSIS — O208 Other hemorrhage in early pregnancy: Secondary | ICD-10-CM

## 2016-09-13 DIAGNOSIS — R109 Unspecified abdominal pain: Secondary | ICD-10-CM

## 2016-09-13 DIAGNOSIS — N83209 Unspecified ovarian cyst, unspecified side: Secondary | ICD-10-CM

## 2016-09-13 DIAGNOSIS — B9689 Other specified bacterial agents as the cause of diseases classified elsewhere: Secondary | ICD-10-CM

## 2016-09-13 DIAGNOSIS — O209 Hemorrhage in early pregnancy, unspecified: Secondary | ICD-10-CM

## 2016-09-13 DIAGNOSIS — O9989 Other specified diseases and conditions complicating pregnancy, childbirth and the puerperium: Secondary | ICD-10-CM

## 2016-09-13 DIAGNOSIS — Z3A09 9 weeks gestation of pregnancy: Secondary | ICD-10-CM | POA: Diagnosis not present

## 2016-09-13 DIAGNOSIS — O26899 Other specified pregnancy related conditions, unspecified trimester: Secondary | ICD-10-CM

## 2016-09-13 DIAGNOSIS — D573 Sickle-cell trait: Secondary | ICD-10-CM | POA: Diagnosis not present

## 2016-09-13 DIAGNOSIS — N760 Acute vaginitis: Secondary | ICD-10-CM | POA: Insufficient documentation

## 2016-09-13 LAB — CBC
HEMATOCRIT: 38.2 % (ref 36.0–46.0)
Hemoglobin: 13.5 g/dL (ref 12.0–15.0)
MCH: 28 pg (ref 26.0–34.0)
MCHC: 35.3 g/dL (ref 30.0–36.0)
MCV: 79.3 fL (ref 78.0–100.0)
Platelets: 267 10*3/uL (ref 150–400)
RBC: 4.82 MIL/uL (ref 3.87–5.11)
RDW: 13.1 % (ref 11.5–15.5)
WBC: 13.3 10*3/uL — ABNORMAL HIGH (ref 4.0–10.5)

## 2016-09-13 LAB — WET PREP, GENITAL
SPERM: NONE SEEN
Trich, Wet Prep: NONE SEEN
Yeast Wet Prep HPF POC: NONE SEEN

## 2016-09-13 LAB — DIFFERENTIAL
BASOS PCT: 0 %
Basophils Absolute: 0 10*3/uL (ref 0.0–0.1)
EOS PCT: 0 %
Eosinophils Absolute: 0 10*3/uL (ref 0.0–0.7)
Lymphocytes Relative: 15 %
Lymphs Abs: 2.1 10*3/uL (ref 0.7–4.0)
Monocytes Absolute: 0.3 10*3/uL (ref 0.1–1.0)
Monocytes Relative: 2 %
NEUTROS ABS: 11.2 10*3/uL — AB (ref 1.7–7.7)
NEUTROS PCT: 83 %

## 2016-09-13 LAB — URINALYSIS, ROUTINE W REFLEX MICROSCOPIC
Bacteria, UA: NONE SEEN
Bilirubin Urine: NEGATIVE
GLUCOSE, UA: NEGATIVE mg/dL
HGB URINE DIPSTICK: NEGATIVE
Ketones, ur: NEGATIVE mg/dL
NITRITE: NEGATIVE
PH: 5 (ref 5.0–8.0)
Protein, ur: NEGATIVE mg/dL
SPECIFIC GRAVITY, URINE: 1.013 (ref 1.005–1.030)

## 2016-09-13 LAB — HCG, QUANTITATIVE, PREGNANCY: hCG, Beta Chain, Quant, S: 112332 m[IU]/mL — ABNORMAL HIGH (ref <5)

## 2016-09-13 MED ORDER — PROMETHAZINE HCL 25 MG PO TABS: 12.5000 mg | ORAL_TABLET | Freq: Four times a day (QID) | ORAL | 2 refills | Status: DC | PRN

## 2016-09-13 MED ORDER — PROMETHAZINE HCL 25 MG PO TABS
12.5000 mg | ORAL_TABLET | Freq: Once | ORAL | Status: DC
  Filled 2016-09-13: qty 1

## 2016-09-13 MED ORDER — METRONIDAZOLE 0.75 % VA GEL: 1.0000 | Freq: Two times a day (BID) | VAGINAL | 0 refills | Status: DC

## 2016-09-13 MED ORDER — LACTATED RINGERS IV BOLUS (SEPSIS)
1000.0000 mL | Freq: Once | INTRAVENOUS | Status: AC
  Administered 2016-09-13: 1000 mL via INTRAVENOUS

## 2016-09-13 MED ORDER — PROMETHAZINE HCL 25 MG/ML IJ SOLN
25.0000 mg | Freq: Four times a day (QID) | INTRAMUSCULAR | Status: DC | PRN
  Administered 2016-09-13: 25 mg via INTRAVENOUS
  Filled 2016-09-13: qty 1

## 2016-09-13 NOTE — MAU Provider Note (Signed)
Chief Complaint: Abdominal Pain   SUBJECTIVE HPI: [name] is a 26 y.o. G3P1011 at [redacted]w[redacted]d who presents to [facility] reporting right sided abdominal pain.  Patient is here for right sided abdominal pain, started yesterday evening, but worsened while at work today. It feels achy and dull, but not sharp/stabbing. Pain comes/goes, gradual onset that eventually worsened. States it is uncomfortable. Pain is 5-6/10. No other symptoms. +nausea but no vomiting, has subsided. Denies diarrhea or constipation. Only taking PNV and phenergan. Denies any VB. Denies F/C. States she was recently at [facility] a couple weeks ago for the same pain, states she was told she had a right ovarian cyst, per patient it was "big"--when reviewed records, normal sized simple LEFT ovarian cyst. Was told by the ED physician if pain returns to go back to the ED. Laying still makes it better, walking or movement makes it worse. Patient works as a Production designer, theatre/television/film at [employer]     Past Medical History:  Diagnosis Date  . Constipation   . Gastric ulcer   . History of dysmenorrhea   . Hypoglycemia   . Ovarian cyst 2011  . Sickle cell trait (HCC)    OB History  Gravida Para Term Preterm AB Living  3 1 1  0 1 1  SAB TAB Ectopic Multiple Live Births  1 0 0 0 1    # Outcome Date GA Lbr Len/2nd Weight Sex Delivery Anes PTL Lv  3 Current           2 Term 05/28/12 [redacted]w[redacted]d 36:15 / 01:15 7 lb 8 oz (3.402 kg) M Vag-Spont EPI  LIV  1 SAB              Past Surgical History:  Procedure Laterality Date  . EYE SURGERY     2004  . LAPAROSCOPIC OVARIAN CYSTECTOMY  2011  . LAPAROTOMY N/A 06/16/2012   Procedure: EXPLORATORY LAPAROTOMY;  Surgeon: Emelia Loron, MD;  Location: [facility] OR;  Service: General;  Laterality: N/A;  Repair of duodenal ulcer  . REPAIR OF PERFORATED ULCER N/A 06/16/2012   Procedure: REPAIR OF PERFORATED ULCER;  Surgeon: Emelia Loron, MD;  Location: [facility] OR;  Service: General;  Laterality: N/A;  duodenal    Social History   Social History  . Marital status: Single    Spouse name: N/A  . Number of children: N/A  . Years of education: N/A   Occupational History  . Not on file.   Social History Main Topics  . Smoking status: Never Smoker  . Smokeless tobacco: Never Used  . Alcohol use No  . Drug use: No  . Sexual activity: Yes    Partners: Male    Birth control/ protection: None   Other Topics Concern  . Not on file   Social History Narrative   ** Merged History Encounter **       No current facility-administered medications on file prior to encounter.    Current Outpatient Prescriptions on File Prior to Encounter  Medication Sig Dispense Refill  . promethazine (PHENERGAN) 25 MG tablet Take 0.5-1 tablets (12.5-25 mg total) by mouth every 6 (six) hours as needed. 30 tablet 2  . acetaminophen (TYLENOL) 325 MG tablet Take 325 mg by mouth every 6 (six) hours as needed for moderate pain.    Marland Kitchen docusate sodium (COLACE) 100 MG capsule Take 1 capsule (100 mg total) by mouth 2 (two) times daily as needed. (Patient not taking: Reported on 09/13/2016) 30 capsule 2  .  polyethylene glycol powder (GLYCOLAX/MIRALAX) powder Take 17 g by mouth daily. (Patient not taking: Reported on 09/13/2016) 255 g 0   Allergies  Allergen Reactions  . Nsaids     Diagnosed with a perforated duodenal ulcer due to NSAIDS    I have reviewed the past Medical Hx, Surgical Hx, Social Hx, Allergies and Medications.   REVIEW OF SYSTEMS  A comprehensive ROS was negative except per HPI.   OBJECTIVE Patient Vitals for the past 24 hrs:  BP Temp Temp src Pulse Resp SpO2 Height Weight  09/13/16 2020 112/60 98.3 F (36.8 C) Oral 89 16 - - -  09/13/16 2018 - - - - - - 5\' 3"  (1.6 m) 184 lb (83.5 kg)  09/13/16 1832 135/77 98.1 F (36.7 C) Oral 84 16 100 % - 184 lb (83.5 kg)    PHYSICAL EXAM Constitutional: Well-developed, well-nourished female in no acute distress.  Cardiovascular: normal rate, rhythm, no  murmurs Respiratory: normal rate and effort. CTAB GI: Abd soft, non-tender, no guarding, non-distended. Slight discomfort in RLQ but no guarding. Pos BS x 4 MS: Extremities nontender, no edema, normal ROM Neurologic: Alert and oriented x 4.  GU: Neg CVAT. SPECULUM EXAM: NEFG, physiologic discharge, no blood noted, cervix clean BIMANUAL: cervix closed; uterus normal size for gestational age, no adnexal tenderness or masses. No CMT.  LAB RESULTS Results for orders placed or performed during the hospital encounter of 09/13/16 (from the past 24 hour(s))  Urinalysis, Routine w reflex microscopic     Status: Abnormal   Collection Time: 09/13/16  6:35 PM  Result Value Ref Range   Color, Urine YELLOW YELLOW   APPearance CLEAR CLEAR   Specific Gravity, Urine 1.013 1.005 - 1.030   pH 5.0 5.0 - 8.0   Glucose, UA NEGATIVE NEGATIVE mg/dL   Hgb urine dipstick NEGATIVE NEGATIVE   Bilirubin Urine NEGATIVE NEGATIVE   Ketones, ur NEGATIVE NEGATIVE mg/dL   Protein, ur NEGATIVE NEGATIVE mg/dL   Nitrite NEGATIVE NEGATIVE   Leukocytes, UA LARGE (A) NEGATIVE   RBC / HPF 0-5 0 - 5 RBC/hpf   WBC, UA 6-30 0 - 5 WBC/hpf   Bacteria, UA NONE SEEN NONE SEEN   Squamous Epithelial / LPF 0-5 (A) NONE SEEN   Mucous PRESENT   CBC     Status: Abnormal   Collection Time: 09/13/16  8:39 PM  Result Value Ref Range   WBC 13.3 (H) 4.0 - 10.5 K/uL   RBC 4.82 3.87 - 5.11 MIL/uL   Hemoglobin 13.5 12.0 - 15.0 g/dL   HCT 04.538.2 40.936.0 - 81.146.0 %   MCV 79.3 78.0 - 100.0 fL   MCH 28.0 26.0 - 34.0 pg   MCHC 35.3 30.0 - 36.0 g/dL   RDW 91.413.1 78.211.5 - 95.615.5 %   Platelets 267 150 - 400 K/uL  hCG, quantitative, pregnancy     Status: Abnormal   Collection Time: 09/13/16  8:39 PM  Result Value Ref Range   hCG, Beta Chain, Quant, S 112,332 (H) <5 mIU/mL  Differential     Status: Abnormal   Collection Time: 09/13/16  8:39 PM  Result Value Ref Range   Neutrophils Relative % 83 %   Neutro Abs 11.2 (H) 1.7 - 7.7 K/uL   Lymphocytes  Relative 15 %   Lymphs Abs 2.1 0.7 - 4.0 K/uL   Monocytes Relative 2 %   Monocytes Absolute 0.3 0.1 - 1.0 K/uL   Eosinophils Relative 0 %   Eosinophils Absolute 0.0 0.0 -  0.7 K/uL   Basophils Relative 0 %   Basophils Absolute 0.0 0.0 - 0.1 K/uL  Wet prep, genital     Status: Abnormal   Collection Time: 09/13/16  9:23 PM  Result Value Ref Range   Yeast Wet Prep HPF POC NONE SEEN NONE SEEN   Trich, Wet Prep NONE SEEN NONE SEEN   Clue Cells Wet Prep HPF POC PRESENT (A) NONE SEEN   WBC, Wet Prep HPF POC MANY (A) NONE SEEN   Sperm NONE SEEN     IMAGING US Ob Comp Less 14 Wks  Result Date: 08/17/2016 CLINICAL DATA:  Left lower quadrant pain for 1 week. Spontaneous abortion in January. EXAM: OBSTETRIC <14 WK [place] AND TRANSVAGINAL OB US TECHNIQUE: Both transabdominal and transvaginal ultrasound examinations were performed for complete evaluation of the gestation as well as the maternal uterus, adnexal regions, and pelvic cul-de-sac. Transvaginal technique was performed to assess early pregnancy. COMPARISON:  None. FINDINGS: Intrauterine gestational sac: Single Yolk sac:  Visualized Embryo:  Visualized Cardiac Activity: Not definitively visualized currently Heart Rate:   bpm MSD:   mm    w     d CRL:  2.8  mm   5 w   5 d                  [place] EDC: 04/14/2017 Subchorionic hemorrhage:  None visualized. Maternal uterus/adnexae: 4.4 cm cyst in the left ovary which appears simple. No free fluid. IMPRESSION: Five week 5 day intrauterine pregnancy. Fetal heart tones not definitively detected currently. This could be followed with repeat ultrasound in 14 days to ensure expected progression. 4.4 cm simple appearing left ovarian cyst. Electronically Signed   By: Charlett Nose M.D.   On: 08/17/2016 13:00   US Ob Transvaginal  Addendum Date: 09/13/2016   ADDENDUM REPORT: 09/13/2016 23:47 ADDENDUM: Color flow and spectral Doppler ultrasound evaluation was obtained of both ovaries. Flow is demonstrated in both ovaries  on color flow Doppler imaging. Normal arterial and venous waveforms were obtained in both ovaries. No findings to suggest ovarian torsion. Electronically Signed   By: Burman Nieves M.D.   On: 09/13/2016 23:47   Result Date: 09/13/2016 CLINICAL DATA:  Vaginal bleeding and right lower quadrant pain for 1 day. Estimated gestational age by LMP is 9 weeks 0 days. Quantitative beta HCG is 112,332 EXAM: OBSTETRIC <14 WK [place] AND TRANSVAGINAL OB US TECHNIQUE: Both transabdominal and transvaginal ultrasound examinations were performed for complete evaluation of the gestation as well as the maternal uterus, adnexal regions, and pelvic cul-de-sac. Transvaginal technique was performed to assess early pregnancy. COMPARISON:  08/31/2016 FINDINGS: Intrauterine gestational sac: A single intrauterine gestational sac is present. Yolk sac:  Visualized. Embryo:  Visualized. Cardiac Activity: Visualized. Heart Rate: 186  bpm CRL: 25 mm 9 w 1 d [place] EDC: 04/17/2017. This represents appropriate growth since the previous study. Subchorionic hemorrhage:  None visualized. Maternal uterus/adnexae: Both ovaries are identified. Cyst on the left ovary measuring 4.4 cm maximal diameter probably representing corpus luteal cyst. Slight enlargement since prior study. No free fluid in the pelvis. IMPRESSION: Single intrauterine pregnancy. Estimated gestational age by crown-rump length is 9 weeks 1 day, representing appropriate growth since previous study. No acute complication is demonstrated on ultrasound. Electronically Signed: By: Burman Nieves M.D. On: 09/13/2016 23:21   US Ob Transvaginal  Result Date: 08/17/2016 CLINICAL DATA:  Left lower quadrant pain for 1 week. Spontaneous abortion in January. EXAM: OBSTETRIC <14 WK [place] AND TRANSVAGINAL OB  US TECHNIQUE: Both transabdominal and transvaginal ultrasound examinations were performed for complete evaluation of the gestation as well as the maternal uterus, adnexal regions, and pelvic  cul-de-sac. Transvaginal technique was performed to assess early pregnancy. COMPARISON:  None. FINDINGS: Intrauterine gestational sac: Single Yolk sac:  Visualized Embryo:  Visualized Cardiac Activity: Not definitively visualized currently Heart Rate:   bpm MSD:   mm    w     d CRL:  2.8  mm   5 w   5 d                  [place] EDC: 04/14/2017 Subchorionic hemorrhage:  None visualized. Maternal uterus/adnexae: 4.4 cm cyst in the left ovary which appears simple. No free fluid. IMPRESSION: Five week 5 day intrauterine pregnancy. Fetal heart tones not definitively detected currently. This could be followed with repeat ultrasound in 14 days to ensure expected progression. 4.4 cm simple appearing left ovarian cyst. Electronically Signed   By: Charlett Nose M.D.   On: 08/17/2016 13:00   [place] Art/ven Flow Abd Pelv Doppler Limited  Addendum Date: 09/13/2016   ADDENDUM REPORT: 09/13/2016 23:47 ADDENDUM: Color flow and spectral Doppler ultrasound evaluation was obtained of both ovaries. Flow is demonstrated in both ovaries on color flow Doppler imaging. Normal arterial and venous waveforms were obtained in both ovaries. No findings to suggest ovarian torsion. Electronically Signed   By: Burman Nieves M.D.   On: 09/13/2016 23:47   Result Date: 09/13/2016 CLINICAL DATA:  Vaginal bleeding and right lower quadrant pain for 1 day. Estimated gestational age by LMP is 9 weeks 0 days. Quantitative beta HCG is 112,332 EXAM: OBSTETRIC <14 WK [place] AND TRANSVAGINAL OB US TECHNIQUE: Both transabdominal and transvaginal ultrasound examinations were performed for complete evaluation of the gestation as well as the maternal uterus, adnexal regions, and pelvic cul-de-sac. Transvaginal technique was performed to assess early pregnancy. COMPARISON:  08/31/2016 FINDINGS: Intrauterine gestational sac: A single intrauterine gestational sac is present. Yolk sac:  Visualized. Embryo:  Visualized. Cardiac Activity: Visualized. Heart Rate: 186  bpm  CRL: 25 mm 9 w 1 d [place] EDC: 04/17/2017. This represents appropriate growth since the previous study. Subchorionic hemorrhage:  None visualized. Maternal uterus/adnexae: Both ovaries are identified. Cyst on the left ovary measuring 4.4 cm maximal diameter probably representing corpus luteal cyst. Slight enlargement since prior study. No free fluid in the pelvis. IMPRESSION: Single intrauterine pregnancy. Estimated gestational age by crown-rump length is 9 weeks 1 day, representing appropriate growth since previous study. No acute complication is demonstrated on ultrasound. Electronically Signed: By: Burman Nieves M.D. On: 09/13/2016 23:21    MAU COURSE TVUS with Doppler study - no torsion, +LEFT simple ovarian cyst, likely corpus luteal, 4.4cm.  CBC -  Mild leukocytosis with left shift VSS - afebrile, not tachycardic Wet prep +BV UA - clean GC/CT- pending  While patient was here, she had vomited twice in the bathroom, then came out and said "it is coming out both ends", having diarrhea as well. Given IVF Bolus with IV Phenergan, symptoms resolved, abdominal pain improved.   MDM Plan of care reviewed with patient, including labs and tests ordered and medical treatment. Likely gastroenteritis from viral infection. Discussed warning signs to return to MAU immediately, including worse pain, fevers/chills, unable to tolerate PO intake. No ovarian torsion noted on [place], good arterial and venous Doppler flow to bilateral ovaries.    ASSESSMENT 1. Gastroenteritis   3. Ovarian cyst   4. Abdominal pain  5. Abdominal pain affecting pregnancy   6. Corpus luteum cyst of left ovary   7. Bacterial vaginosis     PLAN Discharge home in stable condition. Metrogel Rx (did not do flagyl due to emesis/diarrhea) Parke Simmers diet recommended and PO hydration, avoid spicy/greasy foods, avoid sugary beverages especially apple juice Phenergan sent to pharmacy    Allergies as of 09/14/2016      Reactions   Nsaids     Diagnosed with a perforated duodenal ulcer due to NSAIDS      Medication List    TAKE these medications   acetaminophen 325 MG tablet Commonly known as:  TYLENOL Take 325 mg by mouth every 6 (six) hours as needed for moderate pain.   docusate sodium 100 MG capsule Commonly known as:  COLACE Take 1 capsule (100 mg total) by mouth 2 (two) times daily as needed.   metroNIDAZOLE 0.75 % vaginal gel Commonly known as:  METROGEL VAGINAL Place 1 Applicatorful vaginally 2 (two) times daily.   polyethylene glycol powder powder Commonly known as:  GLYCOLAX/MIRALAX Take 17 g by mouth daily.   prenatal multivitamin Tabs tablet Take 1 tablet by mouth every morning.   promethazine 25 MG tablet Commonly known as:  PHENERGAN Take 0.5-1 tablets (12.5-25 mg total) by mouth every 6 (six) hours as needed.        Jen Mow, DO OB Fellow 09/13/2016 8:32 PM

## 2016-09-13 NOTE — MAU Note (Signed)
Pt. has been vomiting throughout the evening. IV started and phenergan given via IV. Pt. Currently in radiology for U/S.

## 2016-09-13 NOTE — Discharge Instructions (Signed)
Bacterial Vaginosis °Bacterial vaginosis is a vaginal infection that occurs when the normal balance of bacteria in the vagina is disrupted. It results from an overgrowth of certain bacteria. This is the most common vaginal infection among women ages 15-44. °Because bacterial vaginosis increases your risk for STIs (sexually transmitted infections), getting treated can help reduce your risk for chlamydia, gonorrhea, herpes, and HIV (human immunodeficiency virus). Treatment is also important for preventing complications in pregnant women, because this condition can cause an early (premature) delivery. °What are the causes? °This condition is caused by an increase in harmful bacteria that are normally present in small amounts in the vagina. However, the reason that the condition develops is not fully understood. °What increases the risk? °The following factors may make you more likely to develop this condition: °· Having a new sexual partner or multiple sexual partners. °· Having unprotected sex. °· Douching. °· Having an intrauterine device (IUD). °· Smoking. °· Drug and alcohol abuse. °· Taking certain antibiotic medicines. °· Being pregnant. °You cannot get bacterial vaginosis from toilet seats, bedding, swimming pools, or contact with objects around you. °What are the signs or symptoms? °Symptoms of this condition include: °· Grey or white vaginal discharge. The discharge can also be watery or foamy. °· A fish-like odor with discharge, especially after sexual intercourse or during menstruation. °· Itching in and around the vagina. °· Burning or pain with urination. °Some women with bacterial vaginosis have no signs or symptoms. °How is this diagnosed? °This condition is diagnosed based on: °· Your medical history. °· A physical exam of the vagina. °· Testing a sample of vaginal fluid under a microscope to look for a large amount of bad bacteria or abnormal cells. Your health care provider may use a cotton swab or a  small wooden spatula to collect the sample. °How is this treated? °This condition is treated with antibiotics. These may be given as a pill, a vaginal cream, or a medicine that is put into the vagina (suppository). If the condition comes back after treatment, a second round of antibiotics may be needed. °Follow these instructions at home: °Medicines  °· Take over-the-counter and prescription medicines only as told by your health care provider. °· Take or use your antibiotic as told by your health care provider. Do not stop taking or using the antibiotic even if you start to feel better. °General instructions  °· If you have a female sexual partner, tell her that you have a vaginal infection. She should see her health care provider and be treated if she has symptoms. If you have a female sexual partner, he does not need treatment. °· During treatment: °¨ Avoid sexual activity until you finish treatment. °¨ Do not douche. °¨ Avoid alcohol as directed by your health care provider. °¨ Avoid breastfeeding as directed by your health care provider. °· Drink enough water and fluids to keep your urine clear or pale yellow. °· Keep the area around your vagina and rectum clean. °¨ Wash the area daily with warm water. °¨ Wipe yourself from front to back after using the toilet. °· Keep all follow-up visits as told by your health care provider. This is important. °How is this prevented? °· Do not douche. °· Wash the outside of your vagina with warm water only. °· Use protection when having sex. This includes latex condoms and dental dams. °· Limit how many sexual partners you have. To help prevent bacterial vaginosis, it is best to have sex with just one   partner (monogamous).  Make sure you and your sexual partner are tested for STIs.  Wear cotton or cotton-lined underwear.  Avoid wearing tight pants and pantyhose, especially during summer.  Limit the amount of alcohol that you drink.  Do not use any products that contain  nicotine or tobacco, such as cigarettes and e-cigarettes. If you need help quitting, ask your health care provider.  Do not use illegal drugs. Where to find more information:  Centers for Disease Control and Prevention: SolutionApps.co.za  American Sexual Health Association (ASHA): www.ashastd.org  U.S. Department of Health and Health and safety inspector, Office on Women's Health: ConventionalMedicines.si or http://www.anderson-williamson.info/ Contact a health care provider if:  Your symptoms do not improve, even after treatment.  You have more discharge or pain when urinating.  You have a fever.  You have pain in your abdomen.  You have pain during sex.  You have vaginal bleeding between periods. Summary  Bacterial vaginosis is a vaginal infection that occurs when the normal balance of bacteria in the vagina is disrupted.  Because bacterial vaginosis increases your risk for STIs (sexually transmitted infections), getting treated can help reduce your risk for chlamydia, gonorrhea, herpes, and HIV (human immunodeficiency virus). Treatment is also important for preventing complications in pregnant women, because the condition can cause an early (premature) delivery.  This condition is treated with antibiotic medicines. These may be given as a pill, a vaginal cream, or a medicine that is put into the vagina (suppository). This information is not intended to replace advice given to you by your health care provider. Make sure you discuss any questions you have with your health care provider. Document Released: 04/12/2005 Document Revised: 12/27/2015 Document Reviewed: 12/27/2015 Elsevier Interactive Patient Education  2017 Elsevier Inc. Abdominal Pain During Pregnancy Belly (abdominal) pain is common during pregnancy. Most of the time, it is not a serious problem. Other times, it can be a sign that something is wrong with the pregnancy. Always tell your doctor if you have belly  pain. Follow these instructions at home: Monitor your belly pain for any changes. The following actions may help you feel better:  Do not have sex (intercourse) or put anything in your vagina until you feel better.  Rest until your pain stops.  Drink clear fluids if you feel sick to your stomach (nauseous). Do not eat solid food until you feel better.  Only take medicine as told by your doctor.  Keep all doctor visits as told. Get help right away if:  You are bleeding, leaking fluid, or pieces of tissue come out of your vagina.  You have more pain or cramping.  You keep throwing up (vomiting).  You have pain when you pee (urinate) or have blood in your pee.  You have a fever.  You do not feel your baby moving as much.  You feel very weak or feel like passing out.  You have trouble breathing, with or without belly pain.  You have a very bad headache and belly pain.  You have fluid leaking from your vagina and belly pain.  You keep having watery poop (diarrhea).  Your belly pain does not go away after resting, or the pain gets worse. This information is not intended to replace advice given to you by your health care provider. Make sure you discuss any questions you have with your health care provider. Document Released: 03/31/2009 Document Revised: 11/19/2015 Document Reviewed: 11/09/2012 Elsevier Interactive Patient Education  2017 [employer].   Food Choices to Help Relieve  Diarrhea, Adult When you have diarrhea, the foods you eat and your eating habits are very important. Choosing the right foods and drinks can help:  Relieve diarrhea.  Replace lost fluids and nutrients.  Prevent dehydration. What general guidelines should I follow? Relieving diarrhea   Choose foods with less than 2 g or .07 oz. of fiber per serving.  Limit fats to less than 8 tsp (38 g or 1.34 oz.) a day.  Avoid the following:  Foods and beverages sweetened with high-fructose corn  syrup, honey, or sugar alcohols such as xylitol, sorbitol, and mannitol.  Foods that contain a lot of fat or sugar.  Fried, greasy, or spicy foods.  High-fiber grains, breads, and cereals.  Raw fruits and vegetables.  Eat foods that are rich in probiotics. These foods include dairy products such as yogurt and fermented milk products. They help increase healthy bacteria in the stomach and intestines (gastrointestinal tract, or GI tract).  If you have lactose intolerance, avoid dairy products. These may make your diarrhea worse.  Take medicine to help stop diarrhea (antidiarrheal medicine) only as told by your health care provider. Replacing nutrients   Eat small meals or snacks every 3-4 hours.  Eat bland foods, such as white rice, toast, or baked potato, until your diarrhea starts to get better. Gradually reintroduce nutrient-rich foods as tolerated or as told by your health care provider. This includes:  Well-cooked protein foods.  Peeled, seeded, and soft-cooked fruits and vegetables.  Low-fat dairy products.  Take vitamin and mineral supplements as told by your health care provider. Preventing dehydration    Start by sipping water or a special solution to prevent dehydration (oral rehydration solution, ORS). Urine that is clear or pale yellow means that you are getting enough fluid.  Try to drink at least 8-10 cups of fluid each day to help replace lost fluids.  You may add other liquids in addition to water, such as clear juice or decaffeinated sports drinks, as tolerated or as told by your health care provider.  Avoid drinks with caffeine, such as coffee, tea, or soft drinks.  Avoid alcohol. What foods are recommended? The items listed may not be a complete list. Talk with your health care provider about what dietary choices are best for you. Grains  White rice. White, [place], or pita breads (fresh or toasted), including plain rolls, buns, or bagels. White pasta.  Saltine, soda, or graham crackers. Pretzels. Low-fiber cereal. Cooked cereals made with water (such as cornmeal, farina, or cream cereals). Plain muffins. Matzo. Melba toast. Zwieback. Vegetables  Potatoes (without the skin). Most well-cooked and canned vegetables without skins or seeds. Tender lettuce. Fruits  Apple sauce. Fruits canned in juice. Cooked apricots, cherries, grapefruit, peaches, pears, or plums. Fresh bananas and cantaloupe. Meats and other protein foods  Baked or boiled chicken. Eggs. Tofu. Fish. Seafood. Smooth nut butters. Ground or well-cooked tender beef, ham, veal, lamb, pork, or poultry. Dairy  Plain yogurt, kefir, and unsweetened liquid yogurt. Lactose-free milk, buttermilk, skim milk, or soy milk. Low-fat or nonfat hard cheese. Beverages  Water. Low-calorie sports drinks. Fruit juices without pulp. Strained tomato and vegetable juices. Decaffeinated teas. Sugar-free beverages not sweetened with sugar alcohols. Oral rehydration solutions, if approved by your health care provider. Seasoning and other foods  Bouillon, broth, or soups made from recommended foods. What foods are not recommended? The items listed may not be a complete list. Talk with your health care provider about what dietary choices are best for you.  Grains  Whole grain, whole wheat, bran, or rye breads, rolls, pastas, and crackers. Wild or brown rice. Whole grain or bran cereals. Barley. Oats and oatmeal. Corn tortillas or taco shells. Granola. Popcorn. Vegetables  Raw vegetables. Fried vegetables. Cabbage, broccoli, Brussels sprouts, artichokes, baked beans, beet greens, corn, kale, legumes, peas, sweet potatoes, and yams. Potato skins. Cooked spinach and cabbage. Fruits  Dried fruit, including raisins and dates. Raw fruits. Stewed or dried prunes. Canned fruits with syrup. Meat and other protein foods  Fried or fatty meats. Deli meats. Chunky nut butters. Nuts and seeds. Beans and lentils. Tomasa BlaseBacon. Hot  dogs. Sausage. Dairy  High-fat cheeses. Whole milk, chocolate milk, and beverages made with milk, such as milk shakes. Half-and-half. Cream. sour cream. Ice cream. Beverages  Caffeinated beverages (such as coffee, tea, soda, or energy drinks). Alcoholic beverages. Fruit juices with pulp. Prune juice. Soft drinks sweetened with high-fructose corn syrup or sugar alcohols. High-calorie sports drinks. Fats and oils  Butter. Cream sauces. Margarine. Salad oils. Plain salad dressings. Olives. Avocados. Mayonnaise. Sweets and desserts  Sweet rolls, doughnuts, and sweet breads. Sugar-free desserts sweetened with sugar alcohols such as xylitol and sorbitol. Seasoning and other foods  Honey. Hot sauce. Chili powder. Gravy. Cream-based or milk-based soups. Pancakes and waffles. Summary  When you have diarrhea, the foods you eat and your eating habits are very important.  Make sure you get at least 8-10 cups of fluid each day, or enough to keep your urine clear or pale yellow.  Eat bland foods and gradually reintroduce healthy, nutrient-rich foods as tolerated, or as told by your health care provider.  Avoid high-fiber, fried, greasy, or spicy foods. This information is not intended to replace advice given to you by your health care provider. Make sure you discuss any questions you have with your health care provider. Document Released: 07/03/2003 Document Revised: 04/09/2016 Document Reviewed: 04/09/2016 Elsevier Interactive Patient Education  2017 [employer]

## 2016-09-13 NOTE — MAU Note (Signed)
Having pain on her RLQ.  When she had an US, they told her that there was a cyst in her ovary (HPR wks ago). Pt is pregnant.  Has not been seen since.

## 2016-09-14 LAB — GC/CHLAMYDIA PROBE AMP (~~LOC~~) NOT AT ARMC
CHLAMYDIA, DNA PROBE: NEGATIVE
NEISSERIA GONORRHEA: NEGATIVE

## 2016-09-21 ENCOUNTER — Encounter (HOSPITAL_COMMUNITY): Payer: Self-pay | Admitting: Emergency Medicine

## 2016-09-21 ENCOUNTER — Emergency Department (HOSPITAL_COMMUNITY)
Admission: EM | Admit: 2016-09-21 | Discharge: 2016-09-21 | Disposition: A | Discharge status: Home / Self Care | Payer: Medicaid Other | Attending: Emergency Medicine | Admitting: Emergency Medicine

## 2016-09-21 DIAGNOSIS — W57XXXA Bitten or stung by nonvenomous insect and other nonvenomous arthropods, initial encounter: Secondary | ICD-10-CM | POA: Insufficient documentation

## 2016-09-21 DIAGNOSIS — Z3A10 10 weeks gestation of pregnancy: Secondary | ICD-10-CM | POA: Insufficient documentation

## 2016-09-21 DIAGNOSIS — S80862A Insect bite (nonvenomous), left lower leg, initial encounter: Secondary | ICD-10-CM | POA: Diagnosis not present

## 2016-09-21 DIAGNOSIS — Z79899 Other long term (current) drug therapy: Secondary | ICD-10-CM | POA: Insufficient documentation

## 2016-09-21 DIAGNOSIS — Y929 Unspecified place or not applicable: Secondary | ICD-10-CM | POA: Insufficient documentation

## 2016-09-21 DIAGNOSIS — Y939 Activity, unspecified: Secondary | ICD-10-CM | POA: Diagnosis not present

## 2016-09-21 DIAGNOSIS — T63304A Toxic effect of unspecified spider venom, undetermined, initial encounter: Secondary | ICD-10-CM

## 2016-09-21 DIAGNOSIS — O9A211 Injury, poisoning and certain other consequences of external causes complicating pregnancy, first trimester: Secondary | ICD-10-CM | POA: Insufficient documentation

## 2016-09-21 DIAGNOSIS — Y999 Unspecified external cause status: Secondary | ICD-10-CM | POA: Diagnosis not present

## 2016-09-21 DIAGNOSIS — R1012 Left upper quadrant pain: Secondary | ICD-10-CM | POA: Insufficient documentation

## 2016-09-21 HISTORY — DX: Encounter for supervision of normal pregnancy, unspecified, unspecified trimester: Z34.90

## 2016-09-21 LAB — POC URINE PREG, ED: Preg Test, Ur: POSITIVE — AB

## 2016-09-21 MED ORDER — CEPHALEXIN 500 MG PO CAPS: 500.0000 mg | ORAL_CAPSULE | Freq: Four times a day (QID) | ORAL | 0 refills | Status: DC

## 2016-09-21 NOTE — ED Triage Notes (Signed)
Pt c/o pain and swelling in l/lower leg. Area of redness and warm, firm skin noted on calf. Pt stated that she was bitten by a spider 2 days ago.

## 2016-09-21 NOTE — ED Provider Notes (Signed)
[facility]-EMERGENCY DEPT Provider Note   CSN: [id number] Arrival date & time: 09/21/16  1326     History   Chief Complaint Chief Complaint  Patient presents with  . Abdominal Pain  . Abscess    HPI [name] is a 26 y.o. female who presents with a spider bite wound. She is currently [redacted] weeks pregnant. She states that she was watching tv on the couch when she felt a bite on her left leg. She saw a spider but is unsure of what kind it was. Over the next day she developed redness and swelling on her left calf. It is very tender. No drainage from the area. She also notes that she's had intermittent, mild abdominal cramping in the LUQ since yesterday. She is unsure if this is related to the spider bite so she came in to be checked. She denies fever, chills, vomiting, constipation, diarrhea, dysuria. No vaginal discharge or bleeding. No pelvic cramping or pain. She states she has been more gassy than normal.  HPI  Past Medical History:  Diagnosis Date  . Constipation   . Gastric ulcer   . History of dysmenorrhea   . Hypoglycemia   . Ovarian cyst 2011  . Pregnancy   . Sickle cell trait [facility])     Patient Active Problem List   Diagnosis Date Noted  . Corpus luteum cyst of left ovary 09/13/2016  . DU (perforated duodenal ulcer) (HCC) 06/18/2012  . Normal delivery 06/03/2012  . Anemia of mother, with delivery(648.21) 06/03/2012  . Endometritis following delivery 06/01/2012  . Preterm labor 03/27/2012  . Seasonal allergies 09/10/2010  . New onset of headaches 09/10/2010  . Fever blister 07/08/2010  . OVARIAN CYST, LEFT 10/29/2008  . Abdominal pain, generalized 05/08/2007  . ECZEMA, ATOPIC DERMATITIS 06/23/2006    Past Surgical History:  Procedure Laterality Date  . EYE SURGERY     2004  . LAPAROSCOPIC OVARIAN CYSTECTOMY  2011  . LAPAROTOMY N/A 06/16/2012   Procedure: EXPLORATORY LAPAROTOMY;  Surgeon: Emelia Loron, MD;  Location: [facility] OR;  Service: General;  Laterality:  N/A;  Repair of duodenal ulcer  . REPAIR OF PERFORATED ULCER N/A 06/16/2012   Procedure: REPAIR OF PERFORATED ULCER;  Surgeon: Emelia Loron, MD;  Location: [facility] OR;  Service: General;  Laterality: N/A;  duodenal    OB History    Gravida Para Term Preterm AB Living   3 1 1  0 1 1   SAB TAB Ectopic Multiple Live Births   1 0 0 0 1       Home Medications    Prior to Admission medications   Medication Sig Start Date End Date Taking? Authorizing Provider  acetaminophen (TYLENOL) 325 MG tablet Take 325 mg by mouth every 6 (six) hours as needed for moderate pain.   Yes [provider]  Prenatal Vit-Fe Fumarate-FA (PRENATAL MULTIVITAMIN) TABS tablet Take 1 tablet by mouth every morning.   Yes [provider]  cephALEXin (KEFLEX) 500 MG capsule Take 1 capsule (500 mg total) by mouth 4 (four) times daily. 09/21/16   Bethel Born, PA-C    Family History Family History  Problem Relation Age of Onset  . Breast cancer Paternal Grandmother   . Brain cancer Unknown   . Anesthesia problems Neg Hx   . Hearing loss Neg Hx     Social History Social History  Substance Use Topics  . Smoking status: Never Smoker  . Smokeless tobacco: Never Used  . Alcohol use No  Allergies   Nsaids   Review of Systems Review of Systems  Constitutional: Negative for chills and fever.  Gastrointestinal: Positive for abdominal pain. Negative for constipation, diarrhea, nausea and vomiting.  Genitourinary: Negative for dysuria, vaginal bleeding and vaginal discharge.  Skin: Positive for color change and wound.  Neurological: Negative for weakness and numbness.  All other systems reviewed and are negative.    Physical Exam Updated Vital Signs BP 123/75 (BP Location: Right Arm)   Pulse (!) 107   Temp 98.6 F (37 C) (Oral)   Resp 18   Wt 83.5 kg (184 lb)   LMP 07/12/2016   SpO2 96%   BMI 32.59 kg/m   Physical Exam  Constitutional: She is oriented to person, place,  and time. She appears well-developed and well-nourished. No distress.  HENT:  Head: Normocephalic and atraumatic.  Eyes: Conjunctivae are normal. Pupils are equal, round, and reactive to light. Right eye exhibits no discharge. Left eye exhibits no discharge. No scleral icterus.  Neck: Normal range of motion.  Cardiovascular: Normal rate.   Pulmonary/Chest: Effort normal. No respiratory distress.  Abdominal: Soft. Bowel sounds are normal. She exhibits no distension and no mass. There is no tenderness. There is no rebound and no guarding. No hernia.  Large midline upper abdominal surgical scar  Neurological: She is alert and oriented to person, place, and time.  Skin: Skin is warm and dry.  ~10cm erythematous, warm, tender area over L posterior calf with central purplish area. No drainage or fluctuance  Psychiatric: She has a normal mood and affect. Her behavior is normal.  Nursing note and vitals reviewed.    ED Treatments / Results  Labs (all labs ordered are listed, but only abnormal results are displayed) Labs Reviewed  POC URINE PREG, ED - Abnormal; Notable for the following:       Result Value   Preg Test, Ur POSITIVE (*)    All other components within normal limits    EKG  EKG Interpretation None       Radiology No results found.  Procedures Procedures (including critical care time)  EMERGENCY DEPARTMENT US SOFT TISSUE INTERPRETATION "Study: Limited Soft Tissue Ultrasound"  INDICATIONS: Pain Multiple views of the body part were obtained in real-time with a multi-frequency linear probe  PERFORMED BY: Myself IMAGES ARCHIVED?: No SIDE:Left BODY PART:Lower extremity INTERPRETATION:  No abcess noted   Medications Ordered in ED Medications - No data to display   Initial Impression / Assessment and Plan / ED Course  I have reviewed the triage vital signs and the nursing notes.  Pertinent labs & imaging results that were available during my care of the patient  were reviewed by me and considered in my medical decision making (see chart for details).  26 year old female with spider bite and intermittent LUQ pain. She is mildly tachycardic but improved on recheck. Otherwise vitals are normal. US does not show drainable abscess. Encouraged cool compresses and will give rx for Keflex for 5 days. Tylenol prn. She is also concerned about her baby. Her abdomen is non-tender on exam. More likely this is related to gas vs constipation. She has no pelvic tenderness either. Gave reassurance and strict return precautions.  Final Clinical Impressions(s) / ED Diagnoses   Final diagnoses:  Spider bite wound, undetermined intent, initial encounter  LUQ pain    New Prescriptions New Prescriptions   CEPHALEXIN (KEFLEX) 500 MG CAPSULE    Take 1 capsule (500 mg total) by mouth 4 (four)  times daily.     Bethel Born, PA-C 09/21/16 1608    Lavera Guise, MD 09/21/16 [phone]

## 2016-09-21 NOTE — ED Triage Notes (Signed)
Pt added that she has intermittent cramping on l/side-groin area and has pain in l/lower abdomen.Denies vaginal bleeding- she is [redacted] weeks pregnant

## 2016-09-21 NOTE — Discharge Instructions (Signed)
Take Tylenol for pain as needed Use cool compresses on inflamed area  Take antibiotic for the next 5 days Return if you develop severe, constant abdominal pain, fever, uncontrollable vomiting

## 2016-09-21 NOTE — ED Notes (Signed)
Bed: WA06 Expected date:  Expected time:  Means of arrival:  Comments: Triage7 

## 2016-10-06 ENCOUNTER — Ambulatory Visit (INDEPENDENT_AMBULATORY_CARE_PROVIDER_SITE_OTHER): Payer: Self-pay | Admitting: Advanced Practice Midwife

## 2016-10-06 ENCOUNTER — Encounter: Payer: Self-pay | Admitting: Advanced Practice Midwife

## 2016-10-06 DIAGNOSIS — Z3480 Encounter for supervision of other normal pregnancy, unspecified trimester: Secondary | ICD-10-CM | POA: Insufficient documentation

## 2016-10-06 DIAGNOSIS — O09211 Supervision of pregnancy with history of pre-term labor, first trimester: Secondary | ICD-10-CM

## 2016-10-06 DIAGNOSIS — Z124 Encounter for screening for malignant neoplasm of cervix: Secondary | ICD-10-CM

## 2016-10-06 DIAGNOSIS — Z3481 Encounter for supervision of other normal pregnancy, first trimester: Secondary | ICD-10-CM

## 2016-10-06 LAB — POCT URINALYSIS DIP (DEVICE)
BILIRUBIN URINE: NEGATIVE
GLUCOSE, UA: NEGATIVE mg/dL
HGB URINE DIPSTICK: NEGATIVE
KETONES UR: NEGATIVE mg/dL
Nitrite: NEGATIVE
Protein, ur: NEGATIVE mg/dL
SPECIFIC GRAVITY, URINE: 1.02 (ref 1.005–1.030)
UROBILINOGEN UA: 0.2 mg/dL (ref 0.0–1.0)
pH: 5.5 (ref 5.0–8.0)

## 2016-10-07 ENCOUNTER — Encounter: Payer: Self-pay | Admitting: Advanced Practice Midwife

## 2016-10-07 NOTE — Patient Instructions (Signed)
First Trimester of Pregnancy The first trimester of pregnancy is from week 1 until the end of week 13 (months 1 through 3). A week after a sperm fertilizes an egg, the egg will implant on the wall of the uterus. This embryo will begin to develop into a baby. Genes from you and your partner will form the baby. The female genes will determine whether the baby will be a boy or a girl. At 6-8 weeks, the eyes and face will be formed, and the heartbeat can be seen on ultrasound. At the end of 12 weeks, all the baby's organs will be formed. Now that you are pregnant, you will want to do everything you can to have a healthy baby. Two of the most important things are to get good prenatal care and to follow your health care provider's instructions. Prenatal care is all the medical care you receive before the baby's birth. This care will help prevent, find, and treat any problems during the pregnancy and childbirth. Body changes during your first trimester Your body goes through many changes during pregnancy. The changes vary from woman to woman.  You may gain or lose a couple of pounds at first.  You may feel sick to your stomach (nauseous) and you may throw up (vomit). If the vomiting is uncontrollable, call your health care provider.  You may tire easily.  You may develop headaches that can be relieved by medicines. All medicines should be approved by your health care provider.  You may urinate more often. Painful urination may mean you have a bladder infection.  You may develop heartburn as a result of your pregnancy.  You may develop constipation because certain hormones are causing the muscles that push stool through your intestines to slow down.  You may develop hemorrhoids or swollen veins (varicose veins).  Your breasts may begin to grow larger and become tender. Your nipples may stick out more, and the tissue that surrounds them (areola) may become darker.  Your gums may bleed and may be  sensitive to brushing and flossing.  Dark spots or blotches (chloasma, mask of pregnancy) may develop on your face. This will likely fade after the baby is born.  Your menstrual periods will stop.  You may have a loss of appetite.  You may develop cravings for certain kinds of food.  You may have changes in your emotions from day to day, such as being excited to be pregnant or being concerned that something may go wrong with the pregnancy and baby.  You may have more vivid and strange dreams.  You may have changes in your hair. These can include thickening of your hair, rapid growth, and changes in texture. Some women also have hair loss during or after pregnancy, or hair that feels dry or thin. Your hair will most likely return to normal after your baby is born.  What to expect at prenatal visits During a routine prenatal visit:  You will be weighed to make sure you and the baby are growing normally.  Your blood pressure will be taken.  Your abdomen will be measured to track your baby's growth.  The fetal heartbeat will be listened to between weeks 10 and 14 of your pregnancy.  Test results from any previous visits will be discussed.  Your health care provider may ask you:  How you are feeling.  If you are feeling the baby move.  If you have had any abnormal symptoms, such as leaking fluid, bleeding, severe headaches,   or abdominal cramping.  If you are using any tobacco products, including cigarettes, chewing tobacco, and electronic cigarettes.  If you have any questions.  Other tests that may be performed during your first trimester include:  Blood tests to find your blood type and to check for the presence of any previous infections. The tests will also be used to check for low iron levels (anemia) and protein on red blood cells (Rh antibodies). Depending on your risk factors, or if you previously had diabetes during pregnancy, you may have tests to check for high blood  sugar that affects pregnant women (gestational diabetes).  Urine tests to check for infections, diabetes, or protein in the urine.  An ultrasound to confirm the proper growth and development of the baby.  Fetal screens for spinal cord problems (spina bifida) and Down syndrome.  HIV (human immunodeficiency virus) testing. Routine prenatal testing includes screening for HIV, unless you choose not to have this test.  You may need other tests to make sure you and the baby are doing well.  Follow these instructions at home: Medicines  Follow your health care provider's instructions regarding medicine use. Specific medicines may be either safe or unsafe to take during pregnancy.  Take a prenatal vitamin that contains at least 600 micrograms (mcg) of folic acid.  If you develop constipation, try taking a stool softener if your health care provider approves. Eating and drinking  Eat a balanced diet that includes fresh fruits and vegetables, whole grains, good sources of protein such as meat, eggs, or tofu, and low-fat dairy. Your health care provider will help you determine the amount of weight gain that is right for you.  Avoid raw meat and uncooked cheese. These carry germs that can cause birth defects in the baby.  Eating four or five small meals rather than three large meals a day may help relieve nausea and vomiting. If you start to feel nauseous, eating a few soda crackers can be helpful. Drinking liquids between meals, instead of during meals, also seems to help ease nausea and vomiting.  Limit foods that are high in fat and processed sugars, such as fried and sweet foods.  To prevent constipation: ? Eat foods that are high in fiber, such as fresh fruits and vegetables, whole grains, and beans. ? Drink enough fluid to keep your urine clear or pale yellow. Activity  Exercise only as directed by your health care provider. Most women can continue their usual exercise routine during  pregnancy. Try to exercise for 30 minutes at least 5 days a week. Exercising will help you: ? Control your weight. ? Stay in shape. ? Be prepared for labor and delivery.  Experiencing pain or cramping in the lower abdomen or lower back is a good sign that you should stop exercising. Check with your health care provider before continuing with normal exercises.  Try to avoid standing for long periods of time. Move your legs often if you must stand in one place for a long time.  Avoid heavy lifting.  Wear low-heeled shoes and practice good posture.  You may continue to have sex unless your health care provider tells you not to. Relieving pain and discomfort  Wear a good support bra to relieve breast tenderness.  Take warm sitz baths to soothe any pain or discomfort caused by hemorrhoids. Use hemorrhoid cream if your health care provider approves.  Rest with your legs elevated if you have leg cramps or low back pain.  If you develop   varicose veins in your legs, wear support hose. Elevate your feet for 15 minutes, 3-4 times a day. Limit salt in your diet. Prenatal care  Schedule your prenatal visits by the twelfth week of pregnancy. They are usually scheduled monthly at first, then more often in the last 2 months before delivery.  Write down your questions. Take them to your prenatal visits.  Keep all your prenatal visits as told by your health care provider. This is important. Safety  Wear your seat belt at all times when driving.  Make a list of emergency phone numbers, including numbers for family, friends, the hospital, and police and fire departments. General instructions  Ask your health care provider for a referral to a local prenatal education class. Begin classes no later than the beginning of month 6 of your pregnancy.  Ask for help if you have counseling or nutritional needs during pregnancy. Your health care provider can offer advice or refer you to specialists for help  with various needs.  Do not use hot tubs, steam rooms, or saunas.  Do not douche or use tampons or scented sanitary pads.  Do not cross your legs for long periods of time.  Avoid cat litter boxes and soil used by cats. These carry germs that can cause birth defects in the baby and possibly loss of the fetus by miscarriage or stillbirth.  Avoid all smoking, herbs, alcohol, and medicines not prescribed by your health care provider. Chemicals in these products affect the formation and growth of the baby.  Do not use any products that contain nicotine or tobacco, such as cigarettes and e-cigarettes. If you need help quitting, ask your health care provider. You may receive counseling support and other resources to help you quit.  Schedule a dentist appointment. At home, brush your teeth with a soft toothbrush and be gentle when you floss. Contact a health care provider if:  You have dizziness.  You have mild pelvic cramps, pelvic pressure, or nagging pain in the abdominal area.  You have persistent nausea, vomiting, or diarrhea.  You have a bad smelling vaginal discharge.  You have pain when you urinate.  You notice increased swelling in your face, hands, legs, or ankles.  You are exposed to fifth disease or chickenpox.  You are exposed to German measles (rubella) and have never had it. Get help right away if:  You have a fever.  You are leaking fluid from your vagina.  You have spotting or bleeding from your vagina.  You have severe abdominal cramping or pain.  You have rapid weight gain or loss.  You vomit blood or material that looks like coffee grounds.  You develop a severe headache.  You have shortness of breath.  You have any kind of trauma, such as from a fall or a car accident. Summary  The first trimester of pregnancy is from week 1 until the end of week 13 (months 1 through 3).  Your body goes through many changes during pregnancy. The changes vary from  woman to woman.  You will have routine prenatal visits. During those visits, your health care provider will examine you, discuss any test results you may have, and talk with you about how you are feeling. This information is not intended to replace advice given to you by your health care provider. Make sure you discuss any questions you have with your health care provider. Document Released: 04/06/2001 Document Revised: 03/24/2016 Document Reviewed: 03/24/2016 Elsevier Interactive Patient Education  2017 Elsevier   Inc.  

## 2016-10-07 NOTE — Progress Notes (Signed)
Subjective:    [name] is a G3P1011 [redacted]w[redacted]d being seen today for her first obstetrical visit.  Her obstetrical history is significant for GERD and history of preterm labor with term delivery. Patient does intend to breast feed. Pregnancy history fully reviewed.  Patient reports no complaints.  Vitals:   10/06/16 1250  BP: 119/73  Pulse: 92  Weight: 186 lb 4.8 oz (84.5 kg)    HISTORY: OB History  Gravida Para Term Preterm AB Living  3 1 1  0 1 1  SAB TAB Ectopic Multiple Live Births  1 0 0 0 1    # Outcome Date GA Lbr Len/2nd Weight Sex Delivery Anes PTL Lv  3 Current           2 Term 05/28/12 [redacted]w[redacted]d 36:15 / 01:15 7 lb 8 oz (3.402 kg) M Vag-Spont EPI  LIV  1 SAB              Past Medical History:  Diagnosis Date  . Constipation   . Gastric ulcer   . History of dysmenorrhea   . Hypoglycemia   . Ovarian cyst 2011  . Pregnancy   . Sickle cell trait [facility])    Past Surgical History:  Procedure Laterality Date  . EYE SURGERY     2004  . LAPAROSCOPIC OVARIAN CYSTECTOMY  2011  . LAPAROTOMY N/A 06/16/2012   Procedure: EXPLORATORY LAPAROTOMY;  Surgeon: Emelia Loron, MD;  Location: [facility] OR;  Service: General;  Laterality: N/A;  Repair of duodenal ulcer  . REPAIR OF PERFORATED ULCER N/A 06/16/2012   Procedure: REPAIR OF PERFORATED ULCER;  Surgeon: Emelia Loron, MD;  Location: [facility] OR;  Service: General;  Laterality: N/A;  duodenal   Family History  Problem Relation Age of Onset  . Breast cancer Paternal Grandmother   . Brain cancer Unknown   . Anesthesia problems Neg Hx   . Hearing loss Neg Hx      Exam    Uterus:  Fundal Height: 12 cm  Pelvic Exam:    Perineum: No Hemorrhoids, Normal Perineum   Vulva: Bartholin's, Urethra, Skene's normal   Vagina:  normal discharge   pH:    Cervix: no bleeding following Pap and no cervical motion tenderness   Adnexa: no mass, fullness, tenderness   Bony Pelvis: gynecoid  System: Breast:  normal appearance, no masses  or tenderness   Skin: normal coloration and turgor, no rashes    Neurologic: oriented, grossly non-focal   Extremities: normal strength, tone, and muscle mass   HEENT neck supple with midline trachea   Mouth/Teeth mucous membranes moist, pharynx normal without lesions   Neck supple and no masses   Cardiovascular: regular rate and rhythm   Respiratory:  appears well, vitals normal, no respiratory distress, acyanotic, normal RR, ear and throat exam is normal, neck free of mass or lymphadenopathy, chest clear, no wheezing, crepitations, rhonchi, normal symmetric air entry   Abdomen: soft, non-tender; bowel sounds normal; no masses,  no organomegaly   Urinary: urethral meatus normal      Assessment:    Pregnancy: [id number] Patient Active Problem List   Diagnosis Date Noted  . Supervision of other normal pregnancy, antepartum 10/06/2016  . Corpus luteum cyst of left ovary 09/13/2016  . DU (perforated duodenal ulcer) (HCC) 06/18/2012  . Normal delivery 06/03/2012  . Anemia of mother, with delivery(648.21) 06/03/2012  . Endometritis following delivery 06/01/2012  . Preterm labor 03/27/2012  . Seasonal allergies 09/10/2010  . New onset of headaches  09/10/2010  . Fever blister 07/08/2010  . OVARIAN CYST, LEFT 10/29/2008  . Abdominal pain, generalized 05/08/2007  . ECZEMA, ATOPIC DERMATITIS 06/23/2006        Plan:     Initial labs drawn. Prenatal vitamins. Problem list reviewed and updated. Genetic Screening discussed First Screen: ordered.  Ultrasound discussed; fetal survey: ordered.  Follow up in 4 weeks. 50% of 30 min visit spent on counseling and coordination of care.   Patient was very upset that she was not getting an US today.  States all her friends got one at their new OB. States [facility] told her she would get one today.  We discussed we would order one today,but that they are not usually done at NOB visit.  Will order one for NT this week.   She states it is hard to get off  work.  Routines reviewed Discussed how practice works, interactions with students     Wynelle BourgeoisMarie Yalitza Teed 10/07/2016

## 2016-10-08 LAB — CYTOLOGY - PAP: Diagnosis: NEGATIVE

## 2016-10-08 LAB — PRENATAL PROFILE I(LABCORP)
Antibody Screen: NEGATIVE
BASOS ABS: 0 10*3/uL (ref 0.0–0.2)
Basos: 0 %
EOS (ABSOLUTE): 0.1 10*3/uL (ref 0.0–0.4)
EOS: 1 %
Hematocrit: 38.9 % (ref 34.0–46.6)
Hemoglobin: 12.6 g/dL (ref 11.1–15.9)
Hepatitis B Surface Ag: NEGATIVE
Immature Grans (Abs): 0 10*3/uL (ref 0.0–0.1)
Immature Granulocytes: 0 %
LYMPHS ABS: 1.5 10*3/uL (ref 0.7–3.1)
Lymphs: 14 %
MCH: 26.6 pg (ref 26.6–33.0)
MCHC: 32.4 g/dL (ref 31.5–35.7)
MCV: 82 fL (ref 79–97)
MONOS ABS: 0.7 10*3/uL (ref 0.1–0.9)
Monocytes: 6 %
NEUTROS ABS: 8.5 10*3/uL — AB (ref 1.4–7.0)
Neutrophils: 79 %
PLATELETS: 287 10*3/uL (ref 150–379)
RBC: 4.73 x10E6/uL (ref 3.77–5.28)
RDW: 13.6 % (ref 12.3–15.4)
RH TYPE: POSITIVE
RPR: NONREACTIVE
Rubella Antibodies, IGG: 7.99 index (ref >0.99)
WBC: 10.8 10*3/uL (ref 3.4–10.8)

## 2016-10-08 LAB — URINE CULTURE, OB REFLEX

## 2016-10-08 LAB — HIV ANTIBODY (ROUTINE TESTING W REFLEX): HIV SCREEN 4TH GENERATION: NONREACTIVE

## 2016-10-08 LAB — CULTURE, OB URINE

## 2016-10-13 ENCOUNTER — Encounter (HOSPITAL_COMMUNITY): Payer: Self-pay

## 2016-10-19 ENCOUNTER — Other Ambulatory Visit: Payer: Self-pay | Admitting: Advanced Practice Midwife

## 2016-10-19 ENCOUNTER — Encounter (HOSPITAL_COMMUNITY): Payer: Self-pay

## 2016-10-19 ENCOUNTER — Ambulatory Visit (HOSPITAL_COMMUNITY)
Admission: RE | Admit: 2016-10-19 | Discharge: 2016-10-19 | Disposition: A | Discharge status: Home / Self Care | Payer: Medicaid Other | Source: Ambulatory Visit | Attending: Advanced Practice Midwife | Admitting: Advanced Practice Midwife

## 2016-10-19 DIAGNOSIS — Z3A14 14 weeks gestation of pregnancy: Secondary | ICD-10-CM

## 2016-10-19 DIAGNOSIS — Z3682 Encounter for antenatal screening for nuchal translucency: Secondary | ICD-10-CM

## 2016-10-19 DIAGNOSIS — Z3A15 15 weeks gestation of pregnancy: Secondary | ICD-10-CM

## 2016-10-19 DIAGNOSIS — Z3480 Encounter for supervision of other normal pregnancy, unspecified trimester: Secondary | ICD-10-CM

## 2016-10-19 DIAGNOSIS — Z3482 Encounter for supervision of other normal pregnancy, second trimester: Secondary | ICD-10-CM | POA: Insufficient documentation

## 2016-10-20 ENCOUNTER — Telehealth: Payer: Self-pay | Admitting: Family Medicine

## 2016-10-20 ENCOUNTER — Encounter: Payer: Self-pay | Admitting: Family Medicine

## 2016-10-20 DIAGNOSIS — Z3689 Encounter for other specified antenatal screening: Secondary | ICD-10-CM

## 2016-10-20 DIAGNOSIS — Z3480 Encounter for supervision of other normal pregnancy, unspecified trimester: Secondary | ICD-10-CM

## 2016-10-20 NOTE — Telephone Encounter (Signed)
Patient would like to get an ultrasound.

## 2016-10-22 NOTE — Addendum Note (Signed)
Addended by: Kathee DeltonHILLMAN, Nieves Chapa L on: 10/22/2016 12:19 PM   Modules accepted: Orders

## 2016-10-22 NOTE — Telephone Encounter (Signed)
Called patient stating I am returning her phone call. Patient would like to know why do not do an ultrasound prior to the one she just had. Explained indications of early 1st trimester ultrasound to patient and that without one of those situations insurance will not pay for the ultrasound. Told patient her next ultrasound will be around 19 weeks. Scheduled for 7/30 @ 1015 and informed patient. Patient verbalized understanding to all and had no questions

## 2016-11-10 ENCOUNTER — Ambulatory Visit (INDEPENDENT_AMBULATORY_CARE_PROVIDER_SITE_OTHER): Payer: Medicaid Other | Admitting: Advanced Practice Midwife

## 2016-11-10 ENCOUNTER — Encounter: Payer: Self-pay | Admitting: Advanced Practice Midwife

## 2016-11-10 DIAGNOSIS — Z3482 Encounter for supervision of other normal pregnancy, second trimester: Secondary | ICD-10-CM

## 2016-11-10 DIAGNOSIS — T7421XA Adult sexual abuse, confirmed, initial encounter: Secondary | ICD-10-CM | POA: Insufficient documentation

## 2016-11-10 MED ORDER — PROMETHAZINE HCL 25 MG PO TABS: 25.0000 mg | ORAL_TABLET | Freq: Four times a day (QID) | ORAL | 2 refills | Status: DC | PRN

## 2016-11-10 MED ORDER — VITAMIN B-6 50 MG PO TABS: 50.0000 mg | ORAL_TABLET | Freq: Two times a day (BID) | ORAL | 2 refills | Status: DC | PRN

## 2016-11-10 NOTE — Patient Instructions (Addendum)
Round Ligament Pain The round ligament is a cord of muscle and tissue that helps to support the uterus. It can become a source of pain during pregnancy if it becomes stretched or twisted as the baby grows. The pain usually begins in the second trimester of pregnancy, and it can come and go until the baby is delivered. It is not a serious problem, and it does not cause harm to the baby. Round ligament pain is usually a short, sharp, and pinching pain, but it can also be a dull, lingering, and aching pain. The pain is felt in the lower side of the abdomen or in the groin. It usually starts deep in the groin and moves up to the outside of the hip area. Pain can occur with:  A sudden change in position.  Rolling over in bed.  Coughing or sneezing.  Physical activity.  Follow these instructions at home: Watch your condition for any changes. Take these steps to help with your pain:  When the pain starts, relax. Then try: ? Sitting down. ? Flexing your knees up to your abdomen. ? Lying on your side with one pillow under your abdomen and another pillow between your legs. ? Sitting in a warm bath for 15-20 minutes or until the pain goes away.  Take over-the-counter and prescription medicines only as told by your health care provider.  Move slowly when you sit and stand.  Avoid long walks if they cause pain.  Stop or lessen your physical activities if they cause pain.  Contact a health care provider if:  Your pain does not go away with treatment.  You feel pain in your back that you did not have before.  Your medicine is not helping. Get help right away if:  You develop a fever or chills.  You develop uterine contractions.  You develop vaginal bleeding.  You develop nausea or vomiting.  You develop diarrhea.  You have pain when you urinate. This information is not intended to replace advice given to you by your health care provider. Make sure you discuss any questions you have  with your health care provider. Document Released: 01/20/2008 Document Revised: 09/18/2015 Document Reviewed: 06/19/2014 Elsevier Interactive Patient Education  2018 [employer] Morning Sickness Morning sickness is when you feel sick to your stomach (nauseous) during pregnancy. This nauseous feeling may or may not come with vomiting. It often occurs in the morning but can be a problem any time of day. Morning sickness is most common during the first trimester, but it may continue throughout pregnancy. While morning sickness is unpleasant, it is usually harmless unless you develop severe and continual vomiting (hyperemesis gravidarum). This condition requires more intense treatment. What are the causes? The cause of morning sickness is not completely known but seems to be related to normal hormonal changes that occur in pregnancy. What increases the risk? You are at greater risk if you:  Experienced nausea or vomiting before your pregnancy.  Had morning sickness during a previous pregnancy.  Are pregnant with more than one baby, such as twins.  How is this treated? Do not use any medicines (prescription, over-the-counter, or herbal) for morning sickness without first talking to your health care provider. Your health care provider may prescribe or recommend:  Vitamin B6 supplements.  Anti-nausea medicines.  The herbal medicine ginger.  Follow these instructions at home:  Only take over-the-counter or prescription medicines as directed by your health care provider.  Taking multivitamins before getting pregnant can prevent or  decrease the severity of morning sickness in most women.  Eat a piece of dry toast or unsalted crackers before getting out of bed in the morning.  Eat five or six small meals a day.  Eat dry and bland foods (rice, baked potato). Foods high in carbohydrates are often helpful.  Do not drink liquids with your meals. Drink liquids between meals.  Avoid greasy,  fatty, and spicy foods.  Get someone to cook for you if the smell of any food causes nausea and vomiting.  If you feel nauseous after taking prenatal vitamins, take the vitamins at night or with a snack.  Snack on protein foods (nuts, yogurt, cheese) between meals if you are hungry.  Eat unsweetened gelatins for desserts.  Wearing an acupressure wristband (worn for sea sickness) may be helpful.  Acupuncture may be helpful.  Do not smoke.  Get a humidifier to keep the air in your house free of odors.  Get plenty of fresh air. Contact a health care provider if:  Your home remedies are not working, and you need medicine.  You feel dizzy or lightheaded.  You are losing weight. Get help right away if:  You have persistent and uncontrolled nausea and vomiting.  You pass out (faint). This information is not intended to replace advice given to you by your health care provider. Make sure you discuss any questions you have with your health care provider. Document Released: 06/03/2006 Document Revised: 09/18/2015 Document Reviewed: 09/27/2012 Elsevier Interactive Patient Education  2017 [employer].
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
## 2016-11-15 ENCOUNTER — Encounter (HOSPITAL_COMMUNITY): Payer: Self-pay | Admitting: *Deleted

## 2016-11-22 ENCOUNTER — Ambulatory Visit (HOSPITAL_COMMUNITY)
Admission: RE | Admit: 2016-11-22 | Discharge: 2016-11-22 | Disposition: A | Discharge status: Home / Self Care | Payer: Medicaid Other | Source: Ambulatory Visit | Attending: Advanced Practice Midwife | Admitting: Advanced Practice Midwife

## 2016-11-22 ENCOUNTER — Other Ambulatory Visit: Payer: Self-pay | Admitting: Advanced Practice Midwife

## 2016-11-22 DIAGNOSIS — O99212 Obesity complicating pregnancy, second trimester: Secondary | ICD-10-CM

## 2016-11-22 DIAGNOSIS — Z363 Encounter for antenatal screening for malformations: Secondary | ICD-10-CM | POA: Diagnosis not present

## 2016-11-22 DIAGNOSIS — Z3A19 19 weeks gestation of pregnancy: Secondary | ICD-10-CM | POA: Insufficient documentation

## 2016-11-22 DIAGNOSIS — Z3689 Encounter for other specified antenatal screening: Secondary | ICD-10-CM

## 2016-11-22 DIAGNOSIS — Z369 Encounter for antenatal screening, unspecified: Secondary | ICD-10-CM

## 2016-12-08 ENCOUNTER — Encounter: Payer: Self-pay | Admitting: Certified Nurse Midwife

## 2016-12-08 ENCOUNTER — Telehealth: Payer: Self-pay | Admitting: General Practice

## 2016-12-08 NOTE — Telephone Encounter (Signed)
Called and left message on VM in regards to follow up OB appointment on 12/16/16 at 10:00am.  Patient was a No Show for today's visit.

## 2016-12-16 ENCOUNTER — Encounter: Payer: Self-pay | Admitting: Obstetrics and Gynecology

## 2016-12-18 ENCOUNTER — Encounter (HOSPITAL_COMMUNITY): Payer: Self-pay

## 2016-12-18 ENCOUNTER — Inpatient Hospital Stay (HOSPITAL_COMMUNITY)
Admission: AD | Admit: 2016-12-18 | Discharge: 2016-12-18 | Disposition: A | Discharge status: Home / Self Care | Payer: Medicaid Other | Source: Ambulatory Visit | Attending: Obstetrics and Gynecology | Admitting: Obstetrics and Gynecology

## 2016-12-18 DIAGNOSIS — R109 Unspecified abdominal pain: Secondary | ICD-10-CM | POA: Insufficient documentation

## 2016-12-18 DIAGNOSIS — R1012 Left upper quadrant pain: Secondary | ICD-10-CM | POA: Diagnosis not present

## 2016-12-18 DIAGNOSIS — Z3A22 22 weeks gestation of pregnancy: Secondary | ICD-10-CM | POA: Insufficient documentation

## 2016-12-18 DIAGNOSIS — R1011 Right upper quadrant pain: Secondary | ICD-10-CM | POA: Diagnosis not present

## 2016-12-18 DIAGNOSIS — O26892 Other specified pregnancy related conditions, second trimester: Secondary | ICD-10-CM

## 2016-12-18 LAB — URINALYSIS, ROUTINE W REFLEX MICROSCOPIC
Bilirubin Urine: NEGATIVE
Glucose, UA: NEGATIVE mg/dL
Hgb urine dipstick: NEGATIVE
Ketones, ur: NEGATIVE mg/dL
Nitrite: NEGATIVE
Protein, ur: NEGATIVE mg/dL
Specific Gravity, Urine: 1.014 (ref 1.005–1.030)
pH: 5 (ref 5.0–8.0)

## 2016-12-18 MED ORDER — CYCLOBENZAPRINE HCL 10 MG PO TABS: 10.0000 mg | ORAL_TABLET | Freq: Two times a day (BID) | ORAL | 2 refills | Status: DC | PRN

## 2016-12-18 NOTE — MAU Note (Signed)
Pt c/o pain in localized spot on abdomen next to old scar.   NO other ob or medical concerns.

## 2016-12-18 NOTE — Discharge Instructions (Signed)

## 2016-12-18 NOTE — MAU Provider Note (Signed)
History   G3P1011 @ 22.5 wks in with c/o aupper abd pain that has gotten progressively worse over the past few weeks. Pt has has surgery on upper abd for perforated ulcer in 2014. Pt states she has lots of scar tissue and can feel the pulling pain from svar tissue. States cannot hardly tolerate to sit the pain is so uncomfortable.  CSN: [id number]  Arrival date & time 12/18/16  1749   None     No chief complaint on file.   HPI  Past Medical History:  Diagnosis Date  . Constipation   . Gastric ulcer   . History of dysmenorrhea   . Hypoglycemia   . Ovarian cyst 2011  . Pregnancy   . Sickle cell trait [facility])     Past Surgical History:  Procedure Laterality Date  . EYE SURGERY     2004  . LAPAROSCOPIC OVARIAN CYSTECTOMY  2011  . LAPAROTOMY N/A 06/16/2012   Procedure: EXPLORATORY LAPAROTOMY;  Surgeon: Emelia Loron, MD;  Location: [facility] OR;  Service: General;  Laterality: N/A;  Repair of duodenal ulcer  . REPAIR OF PERFORATED ULCER N/A 06/16/2012   Procedure: REPAIR OF PERFORATED ULCER;  Surgeon: Emelia Loron, MD;  Location: [facility] OR;  Service: General;  Laterality: N/A;  duodenal    Family History  Problem Relation Age of Onset  . Breast cancer Paternal Grandmother   . Brain cancer Unknown   . Anesthesia problems Neg Hx   . Hearing loss Neg Hx     Social History  Substance Use Topics  . Smoking status: Never Smoker  . Smokeless tobacco: Never Used  . Alcohol use No    OB History    Gravida Para Term Preterm AB Living   3 1 1  0 1 1   SAB TAB Ectopic Multiple Live Births   1 0 0 0 1      Review of Systems  Constitutional: Negative.   HENT: Negative.   Eyes: Negative.   Respiratory: Negative.   Cardiovascular: Negative.   Endocrine: Negative.   Genitourinary: Negative.   Musculoskeletal: Negative.   Skin: Negative.   Allergic/Immunologic: Negative.   Neurological: Negative.   Hematological: Negative.   Psychiatric/Behavioral: Negative.     Allergies   Nsaids  Home Medications    LMP 07/12/2016 (Exact Date)   Physical Exam  Constitutional: She is oriented to person, place, and time. She appears well-developed and well-nourished.  HENT:  Head: Normocephalic and atraumatic.  Eyes: Pupils are equal, round, and reactive to light.  Neck: Normal range of motion.  Cardiovascular: Normal rate, regular rhythm, normal heart sounds and intact distal pulses.   Pulmonary/Chest: Effort normal and breath sounds normal.  Abdominal: Soft. Bowel sounds are normal.  Musculoskeletal: Normal range of motion.  Neurological: She is alert and oriented to person, place, and time. She has normal reflexes.  Skin: Skin is warm and dry.  Psychiatric: She has a normal mood and affect. Her behavior is normal. Judgment and thought content normal.    MAU Course  Procedures (including critical care time)  Labs Reviewed  URINALYSIS, ROUTINE W REFLEX MICROSCOPIC   No results found.   No diagnosis found.    MDM  apx 7 cm vertical scar at base of sterum down for rupt ulcer repair. Mod amt scar tissue present. VSS, FHR 150's st and reg per doppler. Discussed comfort measures to help with pulling of scar tissue. Will d/c pt home in stable condition.

## 2016-12-23 ENCOUNTER — Encounter: Payer: Self-pay | Admitting: Student

## 2016-12-23 ENCOUNTER — Telehealth: Payer: Self-pay

## 2016-12-23 NOTE — Telephone Encounter (Signed)
Called patient left message for patient to call us back regarding missed appointment.

## 2017-01-04 ENCOUNTER — Encounter: Payer: Self-pay | Admitting: Certified Nurse Midwife

## 2017-01-24 ENCOUNTER — Ambulatory Visit (INDEPENDENT_AMBULATORY_CARE_PROVIDER_SITE_OTHER): Payer: Medicaid Other | Admitting: Certified Nurse Midwife

## 2017-01-24 ENCOUNTER — Encounter: Payer: Self-pay | Admitting: Certified Nurse Midwife

## 2017-01-24 VITALS — BP 119/77 | HR 95

## 2017-01-24 DIAGNOSIS — O26893 Other specified pregnancy related conditions, third trimester: Secondary | ICD-10-CM

## 2017-01-24 DIAGNOSIS — R109 Unspecified abdominal pain: Secondary | ICD-10-CM

## 2017-01-24 DIAGNOSIS — O99613 Diseases of the digestive system complicating pregnancy, third trimester: Secondary | ICD-10-CM

## 2017-01-24 DIAGNOSIS — K219 Gastro-esophageal reflux disease without esophagitis: Secondary | ICD-10-CM

## 2017-01-24 DIAGNOSIS — Z3480 Encounter for supervision of other normal pregnancy, unspecified trimester: Secondary | ICD-10-CM

## 2017-01-24 MED ORDER — PRENATE PIXIE 10-0.6-0.4-200 MG PO CAPS: 1.0000 | ORAL_CAPSULE | Freq: Every day | ORAL | 12 refills | Status: DC

## 2017-01-24 MED ORDER — PANTOPRAZOLE SODIUM 40 MG PO TBEC: 40.0000 mg | DELAYED_RELEASE_TABLET | Freq: Two times a day (BID) | ORAL | 5 refills | Status: DC

## 2017-01-24 MED ORDER — COMFORT FIT MATERNITY SUPP LG MISC: 1.0000 [IU] | Freq: Every day | 0 refills | Status: DC

## 2017-01-24 NOTE — Patient Instructions (Signed)
Third Trimester of Pregnancy The third trimester is from week 29 through week 42, months 7 through 9. This trimester is when your unborn baby (fetus) is growing very fast. At the end of the ninth month, the unborn baby is about 20 inches in length. It weighs about 6-10 pounds. Follow these instructions at home:  Avoid all smoking, herbs, and alcohol. Avoid drugs not approved by your doctor.  Do not use any tobacco products, including cigarettes, chewing tobacco, and electronic cigarettes. If you need help quitting, ask your doctor. You may get counseling or other support to help you quit.  Only take medicine as told by your doctor. Some medicines are safe and some are not during pregnancy.  Exercise only as told by your doctor. Stop exercising if you start having cramps.  Eat regular, healthy meals.  Wear a good support bra if your breasts are tender.  Do not use hot tubs, steam rooms, or saunas.  Wear your seat belt when driving.  Avoid raw meat, uncooked cheese, and liter boxes and soil used by cats.  Take your prenatal vitamins.  Take 1500-2000 milligrams of calcium daily starting at the 20th week of pregnancy until you deliver your baby.  Try taking medicine that helps you poop (stool softener) as needed, and if your doctor approves. Eat more fiber by eating fresh fruit, vegetables, and whole grains. Drink enough fluids to keep your pee (urine) clear or pale yellow.  Take warm water baths (sitz baths) to soothe pain or discomfort caused by hemorrhoids. Use hemorrhoid cream if your doctor approves.  If you have puffy, bulging veins (varicose veins), wear support hose. Raise (elevate) your feet for 15 minutes, 3-4 times a day. Limit salt in your diet.  Avoid heavy lifting, wear low heels, and sit up straight.  Rest with your legs raised if you have leg cramps or low back pain.  Visit your dentist if you have not gone during your pregnancy. Use a soft toothbrush to brush your  teeth. Be gentle when you floss.  You can have sex (intercourse) unless your doctor tells you not to.  Do not travel far distances unless you must. Only do so with your doctor's approval.  Take prenatal classes.  Practice driving to the hospital.  Pack your hospital bag.  Prepare the baby's room.  Go to your doctor visits. Get help if:  You are not sure if you are in labor or if your water has broken.  You are dizzy.  You have mild cramps or pressure in your lower belly (abdominal).  You have a nagging pain in your belly area.  You continue to feel sick to your stomach (nauseous), throw up (vomit), or have watery poop (diarrhea).  You have bad smelling fluid coming from your vagina.  You have pain with peeing (urination). Get help right away if:  You have a fever.  You are leaking fluid from your vagina.  You are spotting or bleeding from your vagina.  You have severe belly cramping or pain.  You lose or gain weight rapidly.  You have trouble catching your breath and have chest pain.  You notice sudden or extreme puffiness (swelling) of your face, hands, ankles, feet, or legs.  You have not felt the baby move in over an hour.  You have severe headaches that do not go away with medicine.  You have vision changes. This information is not intended to replace advice given to you by your health care provider. Make   sure you discuss any questions you have with your health care provider. Document Released: 07/07/2009 Document Revised: 09/18/2015 Document Reviewed: 06/13/2012 Elsevier Interactive Patient Education  2017 [employer] Contraception Choices Contraception (birth control) is the use of any methods or devices to prevent pregnancy. Below are some methods to help avoid pregnancy. Hormonal methods  Contraceptive implant. This is a thin, plastic tube containing progesterone hormone. It does not contain estrogen hormone. Your health care provider inserts the  tube in the inner part of the upper arm. The tube can remain in place for up to 3 years. After 3 years, the implant must be removed. The implant prevents the ovaries from releasing an egg (ovulation), thickens the cervical mucus to prevent sperm from entering the uterus, and thins the lining of the inside of the uterus.  Progesterone-only injections. These injections are given every 3 months by your health care provider to prevent pregnancy. This synthetic progesterone hormone stops the ovaries from releasing eggs. It also thickens cervical mucus and changes the uterine lining. This makes it harder for sperm to survive in the uterus.  Birth control pills. These pills contain estrogen and progesterone hormone. They work by preventing the ovaries from releasing eggs (ovulation). They also cause the cervical mucus to thicken, preventing the sperm from entering the uterus. Birth control pills are prescribed by a health care provider.Birth control pills can also be used to treat heavy periods.  Minipill. This type of birth control pill contains only the progesterone hormone. They are taken every day of each month and must be prescribed by your health care provider.  Birth control patch. The patch contains hormones similar to those in birth control pills. It must be changed once a week and is prescribed by a health care provider.  Vaginal ring. The ring contains hormones similar to those in birth control pills. It is left in the vagina for 3 weeks, removed for 1 week, and then a new one is put back in place. The patient must be comfortable inserting and removing the ring from the vagina.A health care provider's prescription is necessary.  Emergency contraception. Emergency contraceptives prevent pregnancy after unprotected sexual intercourse. This pill can be taken right after sex or up to 5 days after unprotected sex. It is most effective the sooner you take the pills after having sexual intercourse. Most  emergency contraceptive pills are available without a prescription. Check with your pharmacist. Do not use emergency contraception as your only form of birth control. Barrier methods  Female condom. This is a thin sheath (latex or rubber) that is worn over the penis during sexual intercourse. It can be used with spermicide to increase effectiveness.  Female condom. This is a soft, loose-fitting sheath that is put into the vagina before sexual intercourse.  Diaphragm. This is a soft, latex, dome-shaped barrier that must be fitted by a health care provider. It is inserted into the vagina, along with a spermicidal jelly. It is inserted before intercourse. The diaphragm should be left in the vagina for 6 to 8 hours after intercourse.  Cervical cap. This is a round, soft, latex or plastic cup that fits over the cervix and must be fitted by a health care provider. The cap can be left in place for up to 48 hours after intercourse.  Sponge. This is a soft, circular piece of polyurethane foam. The sponge has spermicide in it. It is inserted into the vagina after wetting it and before sexual intercourse.  Spermicides. These are chemicals   that kill or block sperm from entering the cervix and uterus. They come in the form of creams, jellies, suppositories, foam, or tablets. They do not require a prescription. They are inserted into the vagina with an applicator before having sexual intercourse. The process must be repeated every time you have sexual intercourse. Intrauterine contraception  Intrauterine device (IUD). This is a T-shaped device that is put in a woman's uterus during a menstrual period to prevent pregnancy. There are 2 types: ? Copper IUD. This type of IUD is wrapped in copper wire and is placed inside the uterus. Copper makes the uterus and fallopian tubes produce a fluid that kills sperm. It can stay in place for 10 years. ? Hormone IUD. This type of IUD contains the hormone progestin (synthetic  progesterone). The hormone thickens the cervical mucus and prevents sperm from entering the uterus, and it also thins the uterine lining to prevent implantation of a fertilized egg. The hormone can weaken or kill the sperm that get into the uterus. It can stay in place for 3-5 years, depending on which type of IUD is used. Permanent methods of contraception  Female tubal ligation. This is when the woman's fallopian tubes are surgically sealed, tied, or blocked to prevent the egg from traveling to the uterus.  Hysteroscopic sterilization. This involves placing a small coil or insert into each fallopian tube. Your doctor uses a technique called hysteroscopy to do the procedure. The device causes scar tissue to form. This results in permanent blockage of the fallopian tubes, so the sperm cannot fertilize the egg. It takes about 3 months after the procedure for the tubes to become blocked. You must use another form of birth control for these 3 months.  Female sterilization. This is when the female has the tubes that carry sperm tied off (vasectomy).This blocks sperm from entering the vagina during sexual intercourse. After the procedure, the man can still ejaculate fluid (semen). Natural planning methods  Natural family planning. This is not having sexual intercourse or using a barrier method (condom, diaphragm, cervical cap) on days the woman could become pregnant.  Calendar method. This is keeping track of the length of each menstrual cycle and identifying when you are fertile.  Ovulation method. This is avoiding sexual intercourse during ovulation.  Symptothermal method. This is avoiding sexual intercourse during ovulation, using a thermometer and ovulation symptoms.  Post-ovulation method. This is timing sexual intercourse after you have ovulated. Regardless of which type or method of contraception you choose, it is important that you use condoms to protect against the transmission of sexually  transmitted infections (STIs). Talk with your health care provider about which form of contraception is most appropriate for you. This information is not intended to replace advice given to you by your health care provider. Make sure you discuss any questions you have with your health care provider. Document Released: 04/12/2005 Document Revised: 09/18/2015 Document Reviewed: 10/05/2012 Elsevier Interactive Patient Education  2017 Elsevier Inc.  
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
## 2017-01-31 ENCOUNTER — Other Ambulatory Visit: Payer: Self-pay | Admitting: Certified Nurse Midwife

## 2017-01-31 DIAGNOSIS — Z3480 Encounter for supervision of other normal pregnancy, unspecified trimester: Secondary | ICD-10-CM

## 2017-01-31 LAB — CYSTIC FIBROSIS MUTATION 97: Interpretation: NOT DETECTED

## 2017-02-01 ENCOUNTER — Other Ambulatory Visit: Payer: Self-pay | Admitting: Certified Nurse Midwife

## 2017-02-01 DIAGNOSIS — Z3480 Encounter for supervision of other normal pregnancy, unspecified trimester: Secondary | ICD-10-CM

## 2017-02-01 LAB — MATERNIT21 PLUS CORE+SCA
CHROMOSOME 13: NEGATIVE
CHROMOSOME 21: NEGATIVE
Chromosome 18: NEGATIVE
Y Chromosome: DETECTED

## 2017-02-07 ENCOUNTER — Encounter: Payer: Self-pay | Admitting: Obstetrics

## 2017-02-07 ENCOUNTER — Inpatient Hospital Stay (HOSPITAL_COMMUNITY)
Admission: AD | Admit: 2017-02-07 | Discharge: 2017-02-07 | Disposition: A | Discharge status: Home / Self Care | Payer: Medicaid Other | Source: Ambulatory Visit | Attending: Family Medicine | Admitting: Family Medicine

## 2017-02-07 ENCOUNTER — Other Ambulatory Visit: Payer: Medicaid Other

## 2017-02-07 ENCOUNTER — Encounter (HOSPITAL_COMMUNITY): Payer: Self-pay | Admitting: *Deleted

## 2017-02-07 ENCOUNTER — Ambulatory Visit (INDEPENDENT_AMBULATORY_CARE_PROVIDER_SITE_OTHER): Payer: Medicaid Other | Admitting: Obstetrics

## 2017-02-07 ENCOUNTER — Other Ambulatory Visit (HOSPITAL_COMMUNITY)
Admission: RE | Admit: 2017-02-07 | Discharge: 2017-02-07 | Disposition: A | Discharge status: Home / Self Care | Payer: Medicaid Other | Source: Ambulatory Visit | Attending: Obstetrics | Admitting: Obstetrics

## 2017-02-07 VITALS — BP 122/73 | HR 110 | Wt 209.1 lb

## 2017-02-07 DIAGNOSIS — Z3483 Encounter for supervision of other normal pregnancy, third trimester: Secondary | ICD-10-CM

## 2017-02-07 DIAGNOSIS — O98813 Other maternal infectious and parasitic diseases complicating pregnancy, third trimester: Secondary | ICD-10-CM | POA: Insufficient documentation

## 2017-02-07 DIAGNOSIS — B373 Candidiasis of vulva and vagina: Secondary | ICD-10-CM | POA: Insufficient documentation

## 2017-02-07 DIAGNOSIS — Z3480 Encounter for supervision of other normal pregnancy, unspecified trimester: Secondary | ICD-10-CM

## 2017-02-07 DIAGNOSIS — Z3A30 30 weeks gestation of pregnancy: Secondary | ICD-10-CM | POA: Diagnosis not present

## 2017-02-07 DIAGNOSIS — N898 Other specified noninflammatory disorders of vagina: Secondary | ICD-10-CM | POA: Diagnosis present

## 2017-02-07 DIAGNOSIS — O9989 Other specified diseases and conditions complicating pregnancy, childbirth and the puerperium: Secondary | ICD-10-CM

## 2017-02-07 DIAGNOSIS — Z3689 Encounter for other specified antenatal screening: Secondary | ICD-10-CM

## 2017-02-07 DIAGNOSIS — O26893 Other specified pregnancy related conditions, third trimester: Secondary | ICD-10-CM

## 2017-02-07 DIAGNOSIS — B3731 Acute candidiasis of vulva and vagina: Secondary | ICD-10-CM

## 2017-02-07 LAB — OB RESULTS CONSOLE GC/CHLAMYDIA: Gonorrhea: NEGATIVE

## 2017-02-07 LAB — WET PREP, GENITAL
CLUE CELLS WET PREP: NONE SEEN
SPERM: NONE SEEN
TRICH WET PREP: NONE SEEN

## 2017-02-07 LAB — AMNISURE RUPTURE OF MEMBRANE (ROM) NOT AT ARMC: Amnisure ROM: NEGATIVE

## 2017-02-07 MED ORDER — TERCONAZOLE 0.8 % VA CREA: 1.0000 | TOPICAL_CREAM | Freq: Every day | VAGINAL | 0 refills | Status: AC

## 2017-02-07 NOTE — MAU Note (Signed)
Patient reports was seen by Dr. [name] in the office today and was sent for evaluation for LOF and for an ultrasound  LOF started Saturday night Clear Not having to wear a pad  +FM  Denies vaginal bleeding  Increasing "braxton hicks" since saturday

## 2017-02-07 NOTE — Discharge Instructions (Signed)
Third Trimester of Pregnancy The third trimester is from week 28 through week 40 (months 7 through 9). The third trimester is a time when the unborn baby (fetus) is growing rapidly. At the end of the ninth month, the fetus is about 20 inches in length and weighs 6-10 pounds. Body changes during your third trimester Your body will continue to go through many changes during pregnancy. The changes vary from woman to woman. During the third trimester:  Your weight will continue to increase. You can expect to gain 25-35 pounds (11-16 kg) by the end of the pregnancy.  You may begin to get stretch marks on your hips, abdomen, and breasts.  You may urinate more often because the fetus is moving lower into your pelvis and pressing on your bladder.  You may develop or continue to have heartburn. This is caused by increased hormones that slow down muscles in the digestive tract.  You may develop or continue to have constipation because increased hormones slow digestion and cause the muscles that push waste through your intestines to relax.  You may develop hemorrhoids. These are swollen veins (varicose veins) in the rectum that can itch or be painful.  You may develop swollen, bulging veins (varicose veins) in your legs.  You may have increased body aches in the pelvis, back, or thighs. This is due to weight gain and increased hormones that are relaxing your joints.  You may have changes in your hair. These can include thickening of your hair, rapid growth, and changes in texture. Some women also have hair loss during or after pregnancy, or hair that feels dry or thin. Your hair will most likely return to normal after your baby is born.  Your breasts will continue to grow and they will continue to become tender. A yellow fluid (colostrum) may leak from your breasts. This is the first milk you are producing for your baby.  Your belly button may stick out.  You may notice more swelling in your hands,  face, or ankles.  You may have increased tingling or numbness in your hands, arms, and legs. The skin on your belly may also feel numb.  You may feel short of breath because of your expanding uterus.  You may have more problems sleeping. This can be caused by the size of your belly, increased need to urinate, and an increase in your body's metabolism.  You may notice the fetus "dropping," or moving lower in your abdomen (lightening).  You may have increased vaginal discharge.  You may notice your joints feel loose and you may have pain around your pelvic bone.  What to expect at prenatal visits You will have prenatal exams every 2 weeks until week 36. Then you will have weekly prenatal exams. During a routine prenatal visit:  You will be weighed to make sure you and the baby are growing normally.  Your blood pressure will be taken.  Your abdomen will be measured to track your baby's growth.  The fetal heartbeat will be listened to.  Any test results from the previous visit will be discussed.  You may have a cervical check near your due date to see if your cervix has softened or thinned (effaced).  You will be tested for Group B streptococcus. This happens between 35 and 37 weeks.  Your health care provider may ask you:  What your birth plan is.  How you are feeling.  If you are feeling the baby move.  If you have had  any abnormal symptoms, such as leaking fluid, bleeding, severe headaches, or abdominal cramping.  If you are using any tobacco products, including cigarettes, chewing tobacco, and electronic cigarettes.  If you have any questions.  Other tests or screenings that may be performed during your third trimester include:  Blood tests that check for low iron levels (anemia).  Fetal testing to check the health, activity level, and growth of the fetus. Testing is done if you have certain medical conditions or if there are problems during the  pregnancy.  Nonstress test (NST). This test checks the health of your baby to make sure there are no signs of problems, such as the baby not getting enough oxygen. During this test, a belt is placed around your belly. The baby is made to move, and its heart rate is monitored during movement.  What is false labor? False labor is a condition in which you feel small, irregular tightenings of the muscles in the womb (contractions) that usually go away with rest, changing position, or drinking water. These are called Braxton Hicks contractions. Contractions may last for hours, days, or even weeks before true labor sets in. If contractions come at regular intervals, become more frequent, increase in intensity, or become painful, you should see your health care provider. What are the signs of labor?  Abdominal cramps.  Regular contractions that start at 10 minutes apart and become stronger and more frequent with time.  Contractions that start on the top of the uterus and spread down to the lower abdomen and back.  Increased pelvic pressure and dull back pain.  A watery or bloody mucus discharge that comes from the vagina.  Leaking of amniotic fluid. This is also known as your "water breaking." It could be a slow trickle or a gush. Let your health care provider know if it has a color or strange odor. If you have any of these signs, call your health care provider right away, even if it is before your due date. Follow these instructions at home: Medicines  Follow your health care provider's instructions regarding medicine use. Specific medicines may be either safe or unsafe to take during pregnancy.  Take a prenatal vitamin that contains at least 600 micrograms (mcg) of folic acid.  If you develop constipation, try taking a stool softener if your health care provider approves. Eating and drinking  Eat a balanced diet that includes fresh fruits and vegetables, whole grains, good sources of protein  such as meat, eggs, or tofu, and low-fat dairy. Your health care provider will help you determine the amount of weight gain that is right for you.  Avoid raw meat and uncooked cheese. These carry germs that can cause birth defects in the baby.  If you have low calcium intake from food, talk to your health care provider about whether you should take a daily calcium supplement.  Eat four or five small meals rather than three large meals a day.  Limit foods that are high in fat and processed sugars, such as fried and sweet foods.  To prevent constipation: ? Drink enough fluid to keep your urine clear or pale yellow. ? Eat foods that are high in fiber, such as fresh fruits and vegetables, whole grains, and beans. Activity  Exercise only as directed by your health care provider. Most women can continue their usual exercise routine during pregnancy. Try to exercise for 30 minutes at least 5 days a week. Stop exercising if you experience uterine contractions.  Avoid heavy  lifting.  Do not exercise in extreme heat or humidity, or at high altitudes.  Wear low-heel, comfortable shoes.  Practice good posture.  You may continue to have sex unless your health care provider tells you otherwise. Relieving pain and discomfort  Take frequent breaks and rest with your legs elevated if you have leg cramps or low back pain.  Take warm sitz baths to soothe any pain or discomfort caused by hemorrhoids. Use hemorrhoid cream if your health care provider approves.  Wear a good support bra to prevent discomfort from breast tenderness.  If you develop varicose veins: ? Wear support pantyhose or compression stockings as told by your healthcare provider. ? Elevate your feet for 15 minutes, 3-4 times a day. Prenatal care  Write down your questions. Take them to your prenatal visits.  Keep all your prenatal visits as told by your health care provider. This is important. Safety  Wear your seat belt at  all times when driving.  Make a list of emergency phone numbers, including numbers for family, friends, the hospital, and police and fire departments. General instructions  Avoid cat litter boxes and soil used by cats. These carry germs that can cause birth defects in the baby. If you have a cat, ask someone to clean the litter box for you.  Do not travel far distances unless it is absolutely necessary and only with the approval of your health care provider.  Do not use hot tubs, steam rooms, or saunas.  Do not drink alcohol.  Do not use any products that contain nicotine or tobacco, such as cigarettes and e-cigarettes. If you need help quitting, ask your health care provider.  Do not use any medicinal herbs or unprescribed drugs. These chemicals affect the formation and growth of the baby.  Do not douche or use tampons or scented sanitary pads.  Do not cross your legs for long periods of time.  To prepare for the arrival of your baby: ? Take prenatal classes to understand, practice, and ask questions about labor and delivery. ? Make a trial run to the hospital. ? Visit the hospital and tour the maternity area. ? Arrange for maternity or paternity leave through employers. ? Arrange for family and friends to take care of pets while you are in the hospital. ? Purchase a rear-facing car seat and make sure you know how to install it in your car. ? Pack your hospital bag. ? Prepare the babys nursery. Make sure to remove all pillows and stuffed animals from the baby's crib to prevent suffocation.  Visit your dentist if you have not gone during your pregnancy. Use a soft toothbrush to brush your teeth and be gentle when you floss. Contact a health care provider if:  You are unsure if you are in labor or if your water has broken.  You become dizzy.  You have mild pelvic cramps, pelvic pressure, or nagging pain in your abdominal area.  You have lower back pain.  You have persistent  nausea, vomiting, or diarrhea.  You have an unusual or bad smelling vaginal discharge.  You have pain when you urinate. Get help right away if:  Your water breaks before 37 weeks.  You have regular contractions less than 5 minutes apart before 37 weeks.  You have a fever.  You are leaking fluid from your vagina.  You have spotting or bleeding from your vagina.  You have severe abdominal pain or cramping.  You have rapid weight loss or weight gain.  You have shortness of breath with chest pain.  You notice sudden or extreme swelling of your face, hands, ankles, feet, or legs.  Your baby makes fewer than 10 movements in 2 hours.  You have severe headaches that do not go away when you take medicine.  You have vision changes. Summary  The third trimester is from week 28 through week 40, months 7 through 9. The third trimester is a time when the unborn baby (fetus) is growing rapidly.  During the third trimester, your discomfort may increase as you and your baby continue to gain weight. You may have abdominal, leg, and back pain, sleeping problems, and an increased need to urinate.  During the third trimester your breasts will keep growing and they will continue to become tender. A yellow fluid (colostrum) may leak from your breasts. This is the first milk you are producing for your baby.  False labor is a condition in which you feel small, irregular tightenings of the muscles in the womb (contractions) that eventually go away. These are called Braxton Hicks contractions. Contractions may last for hours, days, or even weeks before true labor sets in.  Signs of labor can include: abdominal cramps; regular contractions that start at 10 minutes apart and become stronger and more frequent with time; watery or bloody mucus discharge that comes from the vagina; increased pelvic pressure and dull back pain; and leaking of amniotic fluid. This information is not intended to replace advice  given to you by your health care provider. Make sure you discuss any questions you have with your health care provider. Document Released: 04/06/2001 Document Revised: 09/18/2015 Document Reviewed: 06/13/2012 Elsevier Interactive Patient Education  2017 [employer]   Vaginal Yeast infection, Adult Vaginal yeast infection is a condition that causes soreness, swelling, and redness (inflammation) of the vagina. It also causes vaginal discharge. This is a common condition. Some women get this infection frequently. What are the causes? This condition is caused by a change in the normal balance of the yeast (candida) and bacteria that live in the vagina. This change causes an overgrowth of yeast, which causes the inflammation. What increases the risk? This condition is more likely to develop in:  Women who take antibiotic medicines.  Women who have diabetes.  Women who take birth control pills.  Women who are pregnant.  Women who douche often.  Women who have a weak defense (immune) system.  Women who have been taking steroid medicines for a long time.  Women who frequently wear tight clothing.  What are the signs or symptoms? Symptoms of this condition include:  White, thick vaginal discharge.  Swelling, itching, redness, and irritation of the vagina. The lips of the vagina (vulva) may be affected as well.  Pain or a burning feeling while urinating.  Pain during sex.  How is this diagnosed? This condition is diagnosed with a medical history and physical exam. This will include a pelvic exam. Your health care provider will examine a sample of your vaginal discharge under a microscope. Your health care provider may send this sample for testing to confirm the diagnosis. How is this treated? This condition is treated with medicine. Medicines may be over-the-counter or prescription. You may be told to use one or more of the following:  Medicine that is taken orally.  Medicine  that is applied as a cream.  Medicine that is inserted directly into the vagina (suppository).  Follow these instructions at home:  Take or apply over-the-counter and prescription  medicines only as told by your health care provider.  Do not have sex until your health care provider has approved. Tell your sex partner that you have a yeast infection. That person should go to his or her health care provider if he or she develops symptoms.  Do not wear tight clothes, such as pantyhose or tight pants.  Avoid using tampons until your health care provider approves.  Eat more yogurt. This may help to keep your yeast infection from returning.  Try taking a sitz bath to help with discomfort. This is a warm water bath that is taken while you are sitting down. The water should only come up to your hips and should cover your buttocks. Do this 3-4 times per day or as told by your health care provider.  Do not douche.  Wear breathable, cotton underwear.  If you have diabetes, keep your blood sugar levels under control. Contact a health care provider if:  You have a fever.  Your symptoms go away and then return.  Your symptoms do not get better with treatment.  Your symptoms get worse.  You have new symptoms.  You develop blisters in or around your vagina.  You have blood coming from your vagina and it is not your menstrual period.  You develop pain in your abdomen. This information is not intended to replace advice given to you by your health care provider. Make sure you discuss any questions you have with your health care provider. Document Released: 01/20/2005 Document Revised: 09/24/2015 Document Reviewed: 10/14/2014 Elsevier Interactive Patient Education  2018 [employer].

## 2017-02-07 NOTE — MAU Note (Signed)
Urine in lab 

## 2017-02-07 NOTE — MAU Provider Note (Signed)
History     CSN: [id number]  Arrival date and time: 02/07/17 1031   First Provider Initiated Contact with Patient 02/07/17 1156      Chief Complaint  Patient presents with  . Rupture of Membranes    HPI: [name] is a 26 y.o. G3P1011 with IUP at [redacted]w[redacted]d who presents to maternity admissions from [facility] office to be evaluated for possible ROM. She was seen earlier today at [facility] office and noted that she has had LOF for 3 days. She reports that she never had a large gush of fluid, but her underwear has been constantly wet since Saturaday, soaking through underwear, but not soaking through outer clothes. Reports fluid is clear. Denies vaginal bleeding or regular contractions. Reports good fetal movement. Also denies any abnormal vaginal discharge, fevers, chills, urinary frequency, dysuria, nausea, vomiting, other GI or GU symptoms or other general symptoms.  She receives [facility] at [employer].   Past obstetric history: OB History  Gravida Para Term Preterm AB Living  0 1 1  SAB TAB Ectopic Multiple Live Births  1 0 0 0 1    # Outcome Date GA Lbr Len/2nd Weight Sex Delivery Anes PTL Lv  3 Current           2 Term 05/28/12 [redacted]w[redacted]d 36:15 / 01:15 7 lb 8 oz (3.402 kg) M Vag-Spont EPI  LIV  1 SAB               Past Medical History:  Diagnosis Date  . Anemia   . Constipation   . Diabetes mellitus without complication (HCC)   . Gastric ulcer   . History of dysmenorrhea   . Hypertension   . Hypoglycemia   . Ovarian cyst 2011  . Pregnancy   . Pregnancy induced hypertension   . Sickle cell trait [facility])     Past Surgical History:  Procedure Laterality Date  . EYE SURGERY     2004  . LAPAROSCOPIC OVARIAN CYSTECTOMY  2011  . LAPAROTOMY N/A 06/16/2012   Procedure: EXPLORATORY LAPAROTOMY;  Surgeon: Emelia Loron, MD;  Location: [facility] OR;  Service: General;  Laterality: N/A;  Repair of duodenal ulcer  . REPAIR OF PERFORATED ULCER N/A 06/16/2012   Procedure: REPAIR OF PERFORATED  ULCER;  Surgeon: Emelia Loron, MD;  Location: [facility] OR;  Service: General;  Laterality: N/A;  duodenal    Family History  Problem Relation Age of Onset  . Breast cancer Paternal Grandmother   . Cancer Paternal Grandmother   . Diabetes Paternal Grandmother   . Hypertension Paternal Grandmother   . Brain cancer Unknown   . Asthma Mother   . Stroke Mother   . Anesthesia problems Neg Hx   . Hearing loss Neg Hx     Social History  Substance Use Topics  . Smoking status: Never Smoker  . Smokeless tobacco: Never Used  . Alcohol use No    Allergies:  Allergies  Allergen Reactions  . Nsaids     Diagnosed with a perforated duodenal ulcer due to NSAIDS    Prescriptions Prior to Admission  Medication Sig Dispense Refill Last Dose  . calcium carbonate (TUMS - DOSED IN MG ELEMENTAL CALCIUM) 500 MG chewable tablet Chew 1 tablet by mouth 2 (two) times daily as needed for indigestion or heartburn.    02/06/2017 at Unknown time  . cyclobenzaprine (FLEXERIL) 10 MG tablet Take 1 tablet (10 mg total) by mouth 2 (two) times daily as needed for muscle  spasms. 40 tablet 2 Past Week at Unknown time  . Prenat-FeAsp-Meth-FA-DHA w/o A (PRENATE PIXIE) 10-0.6-0.4-200 MG CAPS Take 1 tablet by mouth daily. 30 capsule 12 Past Week at Unknown time  . acetaminophen (TYLENOL) 325 MG tablet Take 325 mg by mouth every 6 (six) hours as needed for moderate pain.   prn  . Elastic Bandages & Supports (COMFORT FIT MATERNITY SUPP LG) MISC 1 Units by Does not apply route daily. 1 each 0 Taking  . pantoprazole (PROTONIX) 40 MG tablet Take 1 tablet (40 mg total) by mouth 2 (two) times daily. (Patient not taking: Reported on 02/07/2017) 60 tablet 5 Not Taking    Review of Systems - Negative except for what is mentioned in HPI.  Physical Exam   Blood pressure 121/78, pulse (!) 104, temperature 98.3 F (36.8 C), temperature source Oral, resp. rate 17, weight 209 lb (94.8 kg), last menstrual period 07/12/2016, SpO2 98  %, unknown if currently breastfeeding.  Constitutional: Well-developed, well-nourished female in no acute distress.  HENT: [place]/AT, normal oropharynx mucosa. MMM Eyes: normal conjunctivae, no scleral icterus Cardiovascular: rate in low 100s. Normal rythm Respiratory: normal respiratory effort GI: Abd soft, non-tender, gravid appropriate for gestational age.   Pelvic: NEFG, moderate amount of whitish discharge, no blood, no pooling. Cervical os visually closed, no LOF with coughing or bearing down.  SVE: FT/thick MSK: Extremities nontender, no edema, normal ROM Neurologic: Alert and oriented x 4. Psych: Normal mood and affect Skin: warm and dry   FHT:  Baseline 150 , moderate variability, accelerations present, no decelerations Toco: no ctx  MAU Course  Procedures  MDM Pt seen and evaluated. VS and RN notes reviewed.  Reactive NST. R/o ROM: Amnisure, ferning slide, and wet prep collected Ferning negative. Results reviewed: Results for orders placed or performed during the hospital encounter of 02/07/17  Wet prep, genital  Result Value Ref Range   Yeast Wet Prep HPF POC PRESENT (A) NONE SEEN   Trich, Wet Prep NONE SEEN NONE SEEN   Clue Cells Wet Prep HPF POC NONE SEEN NONE SEEN   WBC, Wet Prep HPF POC MANY (A) NONE SEEN   Sperm NONE SEEN   Amnisure rupture of membrane (rom)not at [facility]  Result Value Ref Range   Amnisure ROM NEGATIVE    Discussed with patient negative evaluation for ROM, reassured by no pooling, negative ferning and negative Amnisure.  Assessment and Plan  Assessment: 1. Vaginal discharge during pregnancy in third trimester   2. Yeast vaginitis     Plan: --Rx for terconazole for yeast infection --GC/Chlamydia probe sent --Discharge home in stable condition.  --Discussed ROM and PTL precautions   Degele, Kandra Nicolas, MD 02/07/2017 1:45 PM
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
## 2017-02-08 ENCOUNTER — Other Ambulatory Visit: Payer: Self-pay

## 2017-02-08 ENCOUNTER — Other Ambulatory Visit: Payer: Self-pay | Admitting: Obstetrics

## 2017-02-08 DIAGNOSIS — O99019 Anemia complicating pregnancy, unspecified trimester: Secondary | ICD-10-CM

## 2017-02-08 DIAGNOSIS — O24410 Gestational diabetes mellitus in pregnancy, diet controlled: Secondary | ICD-10-CM

## 2017-02-08 LAB — CBC
HEMATOCRIT: 29 % — AB (ref 34.0–46.6)
Hemoglobin: 9.6 g/dL — ABNORMAL LOW (ref 11.1–15.9)
MCH: 24.1 pg — ABNORMAL LOW (ref 26.6–33.0)
MCHC: 33.1 g/dL (ref 31.5–35.7)
MCV: 73 fL — AB (ref 79–97)
Platelets: 211 10*3/uL (ref 150–379)
RBC: 3.99 x10E6/uL (ref 3.77–5.28)
RDW: 15.3 % (ref 12.3–15.4)
WBC: 11.3 10*3/uL — ABNORMAL HIGH (ref 3.4–10.8)

## 2017-02-08 LAB — GC/CHLAMYDIA PROBE AMP (~~LOC~~) NOT AT ARMC
Chlamydia: NEGATIVE
NEISSERIA GONORRHEA: NEGATIVE

## 2017-02-08 LAB — CERVICOVAGINAL ANCILLARY ONLY
Bacterial vaginitis: NEGATIVE
CANDIDA VAGINITIS: POSITIVE — AB
Chlamydia: NEGATIVE
Neisseria Gonorrhea: NEGATIVE
Trichomonas: NEGATIVE

## 2017-02-08 LAB — HIV ANTIBODY (ROUTINE TESTING W REFLEX): HIV Screen 4th Generation wRfx: NONREACTIVE

## 2017-02-08 LAB — GLUCOSE TOLERANCE, 2 HOURS W/ 1HR
GLUCOSE, 2 HOUR: 112 mg/dL (ref 65–152)
GLUCOSE, FASTING: 92 mg/dL — AB (ref 65–91)
Glucose, 1 hour: 188 mg/dL — ABNORMAL HIGH (ref 65–179)

## 2017-02-08 LAB — RPR: RPR Ser Ql: NONREACTIVE

## 2017-02-08 MED ORDER — FERROUS SULFATE 325 (65 FE) MG PO TABS: 325.0000 mg | ORAL_TABLET | Freq: Two times a day (BID) | ORAL | 5 refills | Status: DC

## 2017-02-09 ENCOUNTER — Other Ambulatory Visit: Payer: Self-pay

## 2017-02-09 ENCOUNTER — Telehealth: Payer: Self-pay

## 2017-02-09 ENCOUNTER — Other Ambulatory Visit: Payer: Self-pay | Admitting: Obstetrics

## 2017-02-09 MED ORDER — ACCU-CHEK GUIDE W/DEVICE KIT: 1.0000 | PACK | Freq: Four times a day (QID) | 0 refills | Status: DC

## 2017-02-09 MED ORDER — GLUCOSE BLOOD VI STRP: ORAL_STRIP | 12 refills | Status: DC

## 2017-02-09 MED ORDER — ACCU-CHEK FASTCLIX LANCETS MISC: 1.0000 | Freq: Four times a day (QID) | 12 refills | Status: DC

## 2017-02-09 NOTE — Telephone Encounter (Signed)
-----   Message from Brock Badharles A Harper, MD sent at 02/08/2017  3:26 PM EDT ----- She is anemic.  FeSO4 prescribed.

## 2017-02-09 NOTE — Addendum Note (Signed)
Addended by: Coral CeoHARPER, Ilka Lovick A on: 02/09/2017 04:43 PM   Modules accepted: Orders

## 2017-02-09 NOTE — Telephone Encounter (Signed)
Left VM message to call office.

## 2017-02-10 ENCOUNTER — Telehealth: Payer: Self-pay

## 2017-02-10 ENCOUNTER — Inpatient Hospital Stay (HOSPITAL_COMMUNITY)
Admission: AD | Admit: 2017-02-10 | Discharge: 2017-02-10 | Disposition: A | Discharge status: Home / Self Care | Payer: Medicaid Other | Source: Ambulatory Visit | Attending: Obstetrics and Gynecology | Admitting: Obstetrics and Gynecology

## 2017-02-10 ENCOUNTER — Encounter (HOSPITAL_COMMUNITY): Payer: Self-pay

## 2017-02-10 DIAGNOSIS — G44229 Chronic tension-type headache, not intractable: Secondary | ICD-10-CM | POA: Insufficient documentation

## 2017-02-10 DIAGNOSIS — R1030 Lower abdominal pain, unspecified: Secondary | ICD-10-CM | POA: Diagnosis not present

## 2017-02-10 DIAGNOSIS — Z3A30 30 weeks gestation of pregnancy: Secondary | ICD-10-CM | POA: Diagnosis not present

## 2017-02-10 DIAGNOSIS — O9989 Other specified diseases and conditions complicating pregnancy, childbirth and the puerperium: Secondary | ICD-10-CM

## 2017-02-10 DIAGNOSIS — O26893 Other specified pregnancy related conditions, third trimester: Secondary | ICD-10-CM | POA: Insufficient documentation

## 2017-02-10 LAB — URINALYSIS, ROUTINE W REFLEX MICROSCOPIC
BILIRUBIN URINE: NEGATIVE
GLUCOSE, UA: NEGATIVE mg/dL
HGB URINE DIPSTICK: NEGATIVE
Ketones, ur: NEGATIVE mg/dL
NITRITE: NEGATIVE
PH: 6 (ref 5.0–8.0)
Protein, ur: NEGATIVE mg/dL
SPECIFIC GRAVITY, URINE: 1.01 (ref 1.005–1.030)

## 2017-02-10 MED ORDER — METOCLOPRAMIDE HCL 5 MG/ML IJ SOLN: 10.0000 mg | Freq: Once | INTRAMUSCULAR | Status: DC

## 2017-02-10 MED ORDER — DIPHENHYDRAMINE HCL 50 MG/ML IJ SOLN: 25.0000 mg | Freq: Once | INTRAMUSCULAR | Status: DC

## 2017-02-10 MED ORDER — DEXAMETHASONE SODIUM PHOSPHATE 10 MG/ML IJ SOLN: 10.0000 mg | Freq: Once | INTRAMUSCULAR | Status: DC

## 2017-02-10 MED ORDER — BUTALBITAL-APAP-CAFFEINE 50-325-40 MG PO TABS
2.0000 | ORAL_TABLET | Freq: Once | ORAL | Status: AC
  Administered 2017-02-10: 2 via ORAL
  Filled 2017-02-10: qty 2

## 2017-02-10 MED ORDER — SODIUM CHLORIDE 0.9 % IV SOLN: INTRAVENOUS | Status: DC

## 2017-02-10 MED ORDER — OXYCODONE HCL 5 MG PO TABS: 5.0000 mg | ORAL_TABLET | Freq: Once | ORAL | Status: DC

## 2017-02-10 NOTE — Discharge Instructions (Signed)
General Headache Without Cause A headache is pain or discomfort felt around the head or neck area. The specific cause of a headache may not be found. There are many causes and types of headaches. A few common ones are:  Tension headaches.  Migraine headaches.  Cluster headaches.  Chronic daily headaches.  Follow these instructions at home: Watch your condition for any changes. Take these steps to help with your condition: Managing pain  Take over-the-counter and prescription medicines only as told by your health care provider.  Lie down in a dark, quiet room when you have a headache.  If directed, apply ice to the head and neck area: ? Put ice in a plastic bag. ? Place a towel between your skin and the bag. ? Leave the ice on for 20 minutes, 2-3 times per day.  Use a heating pad or hot shower to apply heat to the head and neck area as told by your health care provider.  Keep lights dim if bright lights bother you or make your headaches worse. Eating and drinking  Eat meals on a regular schedule.  Limit alcohol use.  Decrease the amount of caffeine you drink, or stop drinking caffeine. General instructions  Keep all follow-up visits as told by your health care provider. This is important.  Keep a headache journal to help find out what may trigger your headaches. For example, write down: ? What you eat and drink. ? How much sleep you get. ? Any change to your diet or medicines.  Try massage or other relaxation techniques.  Limit stress.  Sit up straight, and do not tense your muscles.  Do not use tobacco products, including cigarettes, chewing tobacco, or e-cigarettes. If you need help quitting, ask your health care provider.  Exercise regularly as told by your health care provider.  Sleep on a regular schedule. Get 7-9 hours of sleep, or the amount recommended by your health care provider. Contact a health care provider if:  Your symptoms are not helped by  medicine.  You have a headache that is different from the usual headache.  You have nausea or you vomit.  You have a fever. Get help right away if:  Your headache becomes severe.  You have repeated vomiting.  You have a stiff neck.  You have a loss of vision.  You have problems with speech.  You have pain in the eye or ear.  You have muscular weakness or loss of muscle control.  You lose your balance or have trouble walking.  You feel faint or pass out.  You have confusion. This information is not intended to replace advice given to you by your health care provider. Make sure you discuss any questions you have with your health care provider. Document Released: 04/12/2005 Document Revised: 09/18/2015 Document Reviewed: 08/05/2014 Elsevier Interactive Patient Education  2017 [employer]. Preterm Labor and Birth Information The normal length of a pregnancy is 39-41 weeks. Preterm labor is when labor starts before 37 completed weeks of pregnancy. What are the risk factors for preterm labor? Preterm labor is more likely to occur in women who:  Have certain infections during pregnancy such as a bladder infection, sexually transmitted infection, or infection inside the uterus (chorioamnionitis).  Have a shorter-than-normal cervix.  Have gone into preterm labor before.  Have had surgery on their cervix.  Are younger than age 31 or older than age 68.  Are African American.  Are pregnant with twins or multiple babies (multiple gestation).  Take street drugs or smoke while pregnant.  Do not gain enough weight while pregnant.  Became pregnant shortly after having been pregnant.  What are the symptoms of preterm labor? Symptoms of preterm labor include:  Cramps similar to those that can happen during a menstrual period. The cramps may happen with diarrhea.  Pain in the abdomen or lower back.  Regular uterine contractions that may feel like tightening of the  abdomen.  A feeling of increased pressure in the pelvis.  Increased watery or bloody mucus discharge from the vagina.  Water breaking (ruptured amniotic sac).  Why is it important to recognize signs of preterm labor? It is important to recognize signs of preterm labor because babies who are born prematurely may not be fully developed. This can put them at an increased risk for:  Long-term (chronic) heart and lung problems.  Difficulty immediately after birth with regulating body systems, including blood sugar, body temperature, heart rate, and breathing rate.  Bleeding in the brain.  Cerebral palsy.  Learning difficulties.  Death.  These risks are highest for babies who are born before 34 weeks of pregnancy. How is preterm labor treated? Treatment depends on the length of your pregnancy, your condition, and the health of your baby. It may involve:  Having a stitch (suture) placed in your cervix to prevent your cervix from opening too early (cerclage).  Taking or being given medicines, such as: ? Hormone medicines. These may be given early in pregnancy to help support the pregnancy. ? Medicine to stop contractions. ? Medicines to help mature the babys lungs. These may be prescribed if the risk of delivery is high. ? Medicines to prevent your baby from developing cerebral palsy.  If the labor happens before 34 weeks of pregnancy, you may need to stay in the hospital. What should I do if I think I am in preterm labor? If you think that you are going into preterm labor, call your health care provider right away. How can I prevent preterm labor in future pregnancies? To increase your chance of having a full-term pregnancy:  Do not use any tobacco products, such as cigarettes, chewing tobacco, and e-cigarettes. If you need help quitting, ask your health care provider.  Do not use street drugs or medicines that have not been prescribed to you during your pregnancy.  Talk with  your health care provider before taking any herbal supplements, even if you have been taking them regularly.  Make sure you gain a healthy amount of weight during your pregnancy.  Watch for infection. If you think that you might have an infection, get it checked right away.  Make sure to tell your health care provider if you have gone into preterm labor before.  This information is not intended to replace advice given to you by your health care provider. Make sure you discuss any questions you have with your health care provider. Document Released: 07/03/2003 Document Revised: 09/23/2015 Document Reviewed: 09/03/2015 Elsevier Interactive Patient Education  2018 [employer]

## 2017-02-10 NOTE — Telephone Encounter (Signed)
-----   Message from Brock Badharles A Harper, MD sent at 02/08/2017  3:25 PM EDT ----- GDM.  Referred to [facility] and [facility] for Ultrasound and to get started on ADA Diet and Self Glucose Monitoring.  She will need supplies.

## 2017-02-10 NOTE — Telephone Encounter (Signed)
Patient notified of appt with MFM, to pick up BS devices and take with her to the appt.

## 2017-02-10 NOTE — MAU Note (Signed)
Pt started feeling lower abdominal pressure around 1830 tonight. Felt some tightening while driving over. No LOF of bleeding. + fetal movement.

## 2017-02-10 NOTE — MAU Provider Note (Signed)
Chief Complaint:  Abdominal Pain   First Provider Initiated Contact with Patient 02/10/17 1947     HPI: [name] is a 26 y.o. G3P1011 at 21w3dwho presents to maternity admissions reporting lower abdominal pressure at 1830, with some tightening and cramps.  Now it is better. Also has had a daily headache for 3-4 months.  Goes away with sleep. No bleeding.. She reports good fetal movement, denies LOF, vaginal bleeding, vaginal itching/burning, urinary symptoms, dizziness, n/v, diarrhea, constipation or fever/chills.    Has started new job.  Lots of stress with job.  Abdominal Pain  This is a new problem. The current episode started today. The onset quality is sudden. The problem occurs intermittently. The problem has been rapidly improving. The pain is located in the suprapubic region. The quality of the pain is cramping and dull. The abdominal pain does not radiate. Associated symptoms include headaches. Pertinent negatives include no constipation, diarrhea, fever, myalgias, nausea or vomiting. Nothing aggravates the pain. The pain is relieved by nothing. She has tried nothing for the symptoms.     RN Note: Pt started feeling lower abdominal pressure around 1830 tonight. Felt some tightening while driving over. No LOF of bleeding. + fetal movement.   Past Medical History: Past Medical History:  Diagnosis Date  . Anemia   . Constipation   . Diabetes mellitus without complication (HCC)   . Gastric ulcer   . History of dysmenorrhea   . Hypertension   . Hypoglycemia   . Ovarian cyst 2011  . Pregnancy   . Pregnancy induced hypertension   . Sickle cell trait (HCC)     Past obstetric history: OB History  Gravida Para Term Preterm AB Living  3 1 1  0 1 1  SAB TAB Ectopic Multiple Live Births  1 0 0 0 1    # Outcome Date GA Lbr Len/2nd Weight Sex Delivery Anes PTL Lv  3 Current           2 Term 05/28/12 [redacted]w[redacted]d 36:15 / 01:15 7 lb 8 oz (3.402 kg) M Vag-Spont EPI  LIV  1 SAB                Past Surgical History: Past Surgical History:  Procedure Laterality Date  . EYE SURGERY     2004  . LAPAROSCOPIC OVARIAN CYSTECTOMY  2011  . LAPAROTOMY N/A 06/16/2012   Procedure: EXPLORATORY LAPAROTOMY;  Surgeon: Emelia Loron, MD;  Location: [facility];  Service: General;  Laterality: N/A;  Repair of duodenal ulcer  . REPAIR OF PERFORATED ULCER N/A 06/16/2012   Procedure: REPAIR OF PERFORATED ULCER;  Surgeon: Emelia Loron, MD;  Location: [facility] OR;  Service: General;  Laterality: N/A;  duodenal    Family History: Family History  Problem Relation Age of Onset  . Breast cancer Paternal Grandmother   . Cancer Paternal Grandmother   . Diabetes Paternal Grandmother   . Hypertension Paternal Grandmother   . Brain cancer Unknown   . Asthma Mother   . Stroke Mother   . Anesthesia problems Neg Hx   . Hearing loss Neg Hx     Social History: Social History  Substance Use Topics  . Smoking status: Never Smoker  . Smokeless tobacco: Never Used  . Alcohol use No    Allergies:  Allergies  Allergen Reactions  . Nsaids     Diagnosed with a perforated duodenal ulcer due to NSAIDS    Meds:  Prescriptions Prior to Admission  Medication Sig Dispense Refill  Last Dose  . calcium carbonate (TUMS - DOSED IN MG ELEMENTAL CALCIUM) 500 MG chewable tablet Chew 1 tablet by mouth 2 (two) times daily as needed for indigestion or heartburn.    02/10/2017 at Unknown time  . cyclobenzaprine (FLEXERIL) 10 MG tablet Take 1 tablet (10 mg total) by mouth 2 (two) times daily as needed for muscle spasms. 40 tablet 2 Past Week at Unknown time  . acetaminophen (TYLENOL) 325 MG tablet Take 325 mg by mouth every 6 (six) hours as needed for moderate pain.   prn  . ferrous sulfate 325 (65 FE) MG tablet Take 1 tablet (325 mg total) by mouth 2 (two) times daily with a meal. 60 tablet 5   . pantoprazole (PROTONIX) 40 MG tablet Take 1 tablet (40 mg total) by mouth 2 (two) times daily. (Patient not taking:  Reported on 02/07/2017) 60 tablet 5 Not Taking  . Prenat-FeAsp-Meth-FA-DHA w/o A (PRENATE PIXIE) 10-0.6-0.4-200 MG CAPS Take 1 tablet by mouth daily. (Patient not taking: Reported on 02/10/2017) 30 capsule 12 Not Taking at Unknown time  . terconazole (TERAZOL 3) 0.8 % vaginal cream Place 1 applicator vaginally at bedtime. 20 g 0     I have reviewed patient's Past Medical Hx, Surgical Hx, Family Hx, Social Hx, medications and allergies.   ROS:  Review of Systems  Constitutional: Negative for fever.  Gastrointestinal: Positive for abdominal pain. Negative for constipation, diarrhea, nausea and vomiting.  Musculoskeletal: Negative for myalgias.  Neurological: Positive for headaches.   Other systems negative  Physical Exam  Patient Vitals for the past 24 hrs:  BP Temp Temp src Pulse Resp  02/10/17 1936 131/71 98.1 F (36.7 C) Oral (!) 124 18   Vitals:   02/10/17 1936 02/10/17 2206  BP: 131/71 129/76  Pulse: (!) 124 (!) 110  Resp: 18 18  Temp: 98.1 F (36.7 C) 98.1 F (36.7 C)  TempSrc: Oral     Constitutional: Well-developed, well-nourished female in no acute distress.  Cardiovascular: normal rate and rhythm Respiratory: normal effort, clear to auscultation bilaterally GI: Abd soft, non-tender, gravid appropriate for gestational age.   No rebound or guarding. MS: Extremities nontender, no edema, normal ROM Neurologic: Alert and oriented x 4.  GU: Neg CVAT.  PELVIC EXAM: Dilation: Fingertip Effacement (%): Thick Station: Ballotable  FHT:  Baseline 140 , moderate variability, accelerations present, no decelerations Contractions: Rare   Labs: Results for orders placed or performed during the hospital encounter of 02/10/17 (from the past 24 hour(s))  Urinalysis, Routine w reflex microscopic     Status: Abnormal   Collection Time: 02/10/17  7:50 PM  Result Value Ref Range   Color, Urine YELLOW YELLOW   APPearance CLEAR CLEAR   Specific Gravity, Urine 1.010 1.005 - 1.030    pH 6.0 5.0 - 8.0   Glucose, UA NEGATIVE NEGATIVE mg/dL   Hgb urine dipstick NEGATIVE NEGATIVE   Bilirubin Urine NEGATIVE NEGATIVE   Ketones, ur NEGATIVE NEGATIVE mg/dL   Protein, ur NEGATIVE NEGATIVE mg/dL   Nitrite NEGATIVE NEGATIVE   Leukocytes, UA MODERATE (A) NEGATIVE   RBC / HPF 0-5 0 - 5 RBC/hpf   WBC, UA 6-30 0 - 5 WBC/hpf   Bacteria, UA RARE (A) NONE SEEN   Squamous Epithelial / LPF 0-5 (A) NONE SEEN   Mucus PRESENT     O/Positive/-- (06/13 1349)  Imaging:  No results found.  MAU Course/MDM: I have ordered labs and reviewed results. Urine has leukocytes but rare bacteria on micro.  Will send urine for culture and treat if positive NST reviewed and found to be reactive.  Offered migraine cocktail, states it made her feel funny last time and does not want it.  Will try Fioricet.  >> did not help.   Pt states she just wants to go home.  Offered to add an Oxy IR and she agrees.  Will try 5mg  since she just had Fioricet.  .    Assessment: 1. Supervision of other normal pregnancy, antepartum   Chronic tension-type headache, not intractable - Plan: Discharge patient  Lower abdominal pain - Plan: Discharge patient  Single IUP at [redacted]w[redacted]d   Plan: Discharge home Headache precautions Preterm Labor precautions and fetal kick counts Follow up in Office for prenatal visits and recheck of status  Encouraged to return here or to other Urgent Care/ED if she develops worsening of symptoms, increase in pain, fever, or other concerning symptoms.   Pt stable at time of discharge.  [name] CNM, MSN Certified Nurse-Midwife 02/10/2017 7:47 PM

## 2017-02-12 LAB — CULTURE, OB URINE

## 2017-02-16 ENCOUNTER — Encounter: Payer: Self-pay | Admitting: Registered"

## 2017-02-16 ENCOUNTER — Encounter: Payer: Medicaid Other | Attending: Obstetrics | Admitting: Registered"

## 2017-02-16 DIAGNOSIS — Z713 Dietary counseling and surveillance: Secondary | ICD-10-CM | POA: Diagnosis not present

## 2017-02-16 DIAGNOSIS — O24410 Gestational diabetes mellitus in pregnancy, diet controlled: Secondary | ICD-10-CM | POA: Diagnosis not present

## 2017-02-16 DIAGNOSIS — R7309 Other abnormal glucose: Secondary | ICD-10-CM

## 2017-02-16 NOTE — Progress Notes (Signed)
Patient was seen on 02/16/17 for Gestational Diabetes self-management class at [facility]. The following learning objectives were met by the patient during this course:   States the definition of Gestational Diabetes  States why dietary management is important in controlling blood glucose  Describes the effects each nutrient has on blood glucose levels  Demonstrates ability to create a balanced meal plan  Demonstrates carbohydrate counting   States when to check blood glucose levels  Demonstrates proper blood glucose monitoring techniques  States the effect of stress and exercise on blood glucose levels  States the importance of limiting caffeine and abstaining from alcohol and smoking  Blood glucose monitor given: no meter given Lot # n/a Exp: n/a Blood glucose reading: 109  Patient instructed to monitor glucose levels: FBS: 60 - <95 1 hour: <140 2 hour: <120  Patient received handouts:  Nutrition Diabetes and Pregnancy  Carbohydrate Counting List  Patient will be seen for follow-up as needed.

## 2017-02-21 ENCOUNTER — Ambulatory Visit (INDEPENDENT_AMBULATORY_CARE_PROVIDER_SITE_OTHER): Payer: Medicaid Other | Admitting: Obstetrics

## 2017-02-21 ENCOUNTER — Ambulatory Visit (HOSPITAL_COMMUNITY)
Admission: RE | Admit: 2017-02-21 | Discharge: 2017-02-21 | Disposition: A | Discharge status: Home / Self Care | Payer: Medicaid Other | Source: Ambulatory Visit | Attending: Obstetrics | Admitting: Obstetrics

## 2017-02-21 ENCOUNTER — Encounter: Payer: Self-pay | Admitting: Obstetrics

## 2017-02-21 ENCOUNTER — Ambulatory Visit (HOSPITAL_COMMUNITY): Payer: Medicaid Other

## 2017-02-21 VITALS — BP 120/77 | HR 102 | Wt 208.4 lb

## 2017-02-21 DIAGNOSIS — Z3A32 32 weeks gestation of pregnancy: Secondary | ICD-10-CM | POA: Diagnosis not present

## 2017-02-21 DIAGNOSIS — E669 Obesity, unspecified: Secondary | ICD-10-CM | POA: Diagnosis not present

## 2017-02-21 DIAGNOSIS — O99213 Obesity complicating pregnancy, third trimester: Secondary | ICD-10-CM | POA: Diagnosis not present

## 2017-02-21 DIAGNOSIS — Z3689 Encounter for other specified antenatal screening: Secondary | ICD-10-CM

## 2017-02-21 DIAGNOSIS — O24410 Gestational diabetes mellitus in pregnancy, diet controlled: Secondary | ICD-10-CM

## 2017-02-21 DIAGNOSIS — Z3480 Encounter for supervision of other normal pregnancy, unspecified trimester: Secondary | ICD-10-CM

## 2017-02-21 DIAGNOSIS — Z6833 Body mass index (BMI) 33.0-33.9, adult: Secondary | ICD-10-CM | POA: Insufficient documentation

## 2017-02-21 NOTE — Progress Notes (Signed)
Patient reports good fetal movement with contractions that come and go. 
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
## 2017-02-22 ENCOUNTER — Other Ambulatory Visit: Payer: Self-pay | Admitting: Obstetrics

## 2017-02-22 DIAGNOSIS — Z3689 Encounter for other specified antenatal screening: Secondary | ICD-10-CM

## 2017-02-24 ENCOUNTER — Encounter (HOSPITAL_COMMUNITY): Payer: Self-pay

## 2017-02-24 ENCOUNTER — Inpatient Hospital Stay (HOSPITAL_COMMUNITY)
Admission: AD | Admit: 2017-02-24 | Discharge: 2017-02-24 | Disposition: A | Discharge status: Home / Self Care | Payer: Medicaid Other | Source: Ambulatory Visit | Attending: Obstetrics and Gynecology | Admitting: Obstetrics and Gynecology

## 2017-02-24 DIAGNOSIS — O10913 Unspecified pre-existing hypertension complicating pregnancy, third trimester: Secondary | ICD-10-CM | POA: Diagnosis not present

## 2017-02-24 DIAGNOSIS — Z3A32 32 weeks gestation of pregnancy: Secondary | ICD-10-CM | POA: Diagnosis not present

## 2017-02-24 DIAGNOSIS — O99013 Anemia complicating pregnancy, third trimester: Secondary | ICD-10-CM | POA: Diagnosis not present

## 2017-02-24 DIAGNOSIS — Z3A35 35 weeks gestation of pregnancy: Secondary | ICD-10-CM | POA: Diagnosis not present

## 2017-02-24 DIAGNOSIS — N859 Noninflammatory disorder of uterus, unspecified: Secondary | ICD-10-CM

## 2017-02-24 DIAGNOSIS — R51 Headache: Secondary | ICD-10-CM | POA: Diagnosis not present

## 2017-02-24 DIAGNOSIS — R109 Unspecified abdominal pain: Secondary | ICD-10-CM | POA: Diagnosis not present

## 2017-02-24 DIAGNOSIS — D573 Sickle-cell trait: Secondary | ICD-10-CM | POA: Diagnosis not present

## 2017-02-24 DIAGNOSIS — O26893 Other specified pregnancy related conditions, third trimester: Secondary | ICD-10-CM | POA: Insufficient documentation

## 2017-02-24 DIAGNOSIS — Z79899 Other long term (current) drug therapy: Secondary | ICD-10-CM | POA: Diagnosis not present

## 2017-02-24 DIAGNOSIS — R519 Headache, unspecified: Secondary | ICD-10-CM

## 2017-02-24 DIAGNOSIS — O4703 False labor before 37 completed weeks of gestation, third trimester: Secondary | ICD-10-CM

## 2017-02-24 DIAGNOSIS — O24113 Pre-existing diabetes mellitus, type 2, in pregnancy, third trimester: Secondary | ICD-10-CM | POA: Diagnosis not present

## 2017-02-24 DIAGNOSIS — N858 Other specified noninflammatory disorders of uterus: Secondary | ICD-10-CM

## 2017-02-24 DIAGNOSIS — E119 Type 2 diabetes mellitus without complications: Secondary | ICD-10-CM | POA: Diagnosis not present

## 2017-02-24 DIAGNOSIS — G44209 Tension-type headache, unspecified, not intractable: Secondary | ICD-10-CM

## 2017-02-24 LAB — URINALYSIS, ROUTINE W REFLEX MICROSCOPIC
Bilirubin Urine: NEGATIVE
GLUCOSE, UA: NEGATIVE mg/dL
HGB URINE DIPSTICK: NEGATIVE
Ketones, ur: NEGATIVE mg/dL
NITRITE: NEGATIVE
PROTEIN: NEGATIVE mg/dL
Specific Gravity, Urine: 1.009 (ref 1.005–1.030)
pH: 6 (ref 5.0–8.0)

## 2017-02-24 MED ORDER — BUTALBITAL-APAP-CAFFEINE 50-325-40 MG PO TABS
2.0000 | ORAL_TABLET | Freq: Once | ORAL | Status: DC
  Filled 2017-02-24: qty 2

## 2017-02-24 MED ORDER — BUTALBITAL-APAP-CAFFEINE 50-325-40 MG PO CAPS: 1.0000 | ORAL_CAPSULE | Freq: Four times a day (QID) | ORAL | 3 refills | Status: DC | PRN

## 2017-02-24 NOTE — MAU Note (Signed)
Pt reports contractions that started this morning. States they are every 15-20 mins apart. Pt denies vaginal bleeding or LOF. Reports good fetal movement. Denies complications with pregnancy.

## 2017-02-24 NOTE — MAU Provider Note (Signed)
Chief Complaint:  Contractions   First Provider Initiated Contact with Patient 02/24/17 2105     HPI: [name] is a 26 y.o. G3P1011 at 47w3dwho presents to maternity admissions reporting uterine contractions.  States she was told she was really 35 weeks by the Ultrasonographer.  (US showed baby was >90th percentile for growth, but dates are based on a 9 week [place]).  States also has a headache similar to the one last visit   Wants fioricet for that.  States will take it and "drive home real fast". She reports good fetal movement, denies LOF, vaginal bleeding, vaginal itching/burning, urinary symptoms, dizziness, n/v, diarrhea, constipation or fever/chills.    Abdominal Pain  This is a new problem. The current episode started today. The problem occurs intermittently. The problem has been unchanged. The quality of the pain is cramping. Associated symptoms include headaches. Pertinent negatives include no constipation, diarrhea, dysuria, fever, myalgias, nausea or vomiting. Nothing aggravates the pain. The pain is relieved by nothing. She has tried nothing for the symptoms.    RN Note: Pt reports contractions that started this morning. States they are every 15-20 mins apart. Pt denies vaginal bleeding or LOF. Reports good fetal movement. Denies complications with pregnancy.   Past Medical History: Past Medical History:  Diagnosis Date  . Anemia   . Constipation   . Diabetes mellitus without complication (HCC)   . Gastric ulcer   . History of dysmenorrhea   . Hypertension   . Hypoglycemia   . Ovarian cyst 2011  . Pregnancy   . Pregnancy induced hypertension   . Sickle cell trait (HCC)     Past obstetric history: OB History  Gravida Para Term Preterm AB Living  3 1 1  0 1 1  SAB TAB Ectopic Multiple Live Births  1 0 0 0 1    # Outcome Date GA Lbr Len/2nd Weight Sex Delivery Anes PTL Lv  3 Current           2 Term 05/28/12 [redacted]w[redacted]d 36:15 / 01:15 7 lb 8 oz (3.402 kg) M Vag-Spont  EPI  LIV  1 SAB               Past Surgical History: Past Surgical History:  Procedure Laterality Date  . EYE SURGERY     2004  . LAPAROSCOPIC OVARIAN CYSTECTOMY  2011  . LAPAROTOMY N/A 06/16/2012   Procedure: EXPLORATORY LAPAROTOMY;  Surgeon: Emelia Loron, MD;  Location: [facility] OR;  Service: General;  Laterality: N/A;  Repair of duodenal ulcer  . REPAIR OF PERFORATED ULCER N/A 06/16/2012   Procedure: REPAIR OF PERFORATED ULCER;  Surgeon: Emelia Loron, MD;  Location: [facility] OR;  Service: General;  Laterality: N/A;  duodenal    Family History: Family History  Problem Relation Age of Onset  . Breast cancer Paternal Grandmother   . Cancer Paternal Grandmother   . Diabetes Paternal Grandmother   . Hypertension Paternal Grandmother   . Brain cancer Unknown   . Asthma Mother   . Stroke Mother   . Anesthesia problems Neg Hx   . Hearing loss Neg Hx     Social History: Social History  Substance Use Topics  . Smoking status: Never Smoker  . Smokeless tobacco: Never Used  . Alcohol use No    Allergies:  Allergies  Allergen Reactions  . Nsaids     Diagnosed with a perforated duodenal ulcer due to NSAIDS    Meds:  Prescriptions Prior to Admission  Medication Sig  Dispense Refill Last Dose  . calcium carbonate (TUMS - DOSED IN MG ELEMENTAL CALCIUM) 500 MG chewable tablet Chew 1 tablet by mouth 2 (two) times daily as needed for indigestion or heartburn.    02/24/2017 at Unknown time  . Prenat-FeAsp-Meth-FA-DHA w/o A (PRENATE PIXIE) 10-0.6-0.4-200 MG CAPS Take 1 tablet by mouth daily. 30 capsule 12 Past Month at Unknown time  . cyclobenzaprine (FLEXERIL) 10 MG tablet Take 1 tablet (10 mg total) by mouth 2 (two) times daily as needed for muscle spasms. (Patient not taking: Reported on 02/21/2017) 40 tablet 2 More than a month at Unknown time  . ferrous sulfate 325 (65 FE) MG tablet Take 1 tablet (325 mg total) by mouth 2 (two) times daily with a meal. (Patient not taking: Reported on  02/10/2017) 60 tablet 5 More than a month at Unknown time  . pantoprazole (PROTONIX) 40 MG tablet Take 1 tablet (40 mg total) by mouth 2 (two) times daily. (Patient not taking: Reported on 02/07/2017) 60 tablet 5 More than a month at Unknown time    I have reviewed patient's Past Medical Hx, Surgical Hx, Family Hx, Social Hx, medications and allergies.   ROS:  Review of Systems  Constitutional: Negative for fever.  Gastrointestinal: Positive for abdominal pain. Negative for constipation, diarrhea, nausea and vomiting.  Genitourinary: Negative for dysuria.  Musculoskeletal: Negative for myalgias.  Neurological: Positive for headaches.   Other systems negative  Physical Exam  Patient Vitals for the past 24 hrs:  BP Temp Temp src Pulse Resp SpO2 Height Weight  02/24/17 1922 131/77 98.5 F (36.9 C) Oral (!) 102 18 99 % 5\' 3"  (1.6 m) 214 lb (97.1 kg)   Vitals:   02/24/17 1922 02/24/17 2135  BP: 131/77 130/81  Pulse: (!) 102   Resp: 18   Temp: 98.5 F (36.9 C)   TempSrc: Oral   SpO2: 99%   Weight: 214 lb (97.1 kg)   Height: 5\' 3"  (1.6 m)     Constitutional: Well-developed, well-nourished female in no acute distress.  Cardiovascular: normal rate and rhythm Respiratory: normal effort, clear to auscultation bilaterally GI: Abd soft, non-tender, gravid appropriate for gestational age.   No rebound or guarding. MS: Extremities nontender, no edema, normal ROM Neurologic: Alert and oriented x 4.  GU: Neg CVAT.  PELVIC EXAM:  Dilation: Closed Effacement (%): Thick Station: [place], -3 Exam by:: [name], CNM  FHT:  Baseline 135 , moderate variability, accelerations present, no decelerations Contractions:  Irregular and infrequent    Labs: Results for orders placed or performed during the hospital encounter of 02/24/17 (from the past 24 hour(s))  Urinalysis, Routine w reflex microscopic     Status: Abnormal   Collection Time: 02/24/17  7:18 PM  Result Value Ref  Range   Color, Urine YELLOW YELLOW   APPearance CLEAR CLEAR   Specific Gravity, Urine 1.009 1.005 - 1.030   pH 6.0 5.0 - 8.0   Glucose, UA NEGATIVE NEGATIVE mg/dL   Hgb urine dipstick NEGATIVE NEGATIVE   Bilirubin Urine NEGATIVE NEGATIVE   Ketones, ur NEGATIVE NEGATIVE mg/dL   Protein, ur NEGATIVE NEGATIVE mg/dL   Nitrite NEGATIVE NEGATIVE   Leukocytes, UA TRACE (A) NEGATIVE   RBC / HPF 0-5 0 - 5 RBC/hpf   WBC, UA 0-5 0 - 5 WBC/hpf   Bacteria, UA RARE (A) NONE SEEN   Squamous Epithelial / LPF 0-5 (A) NONE SEEN   Mucus PRESENT    O/Positive/-- (06/13 1349)  Imaging:  [place]  Mfm Ob Follow Up  Result Date: 02/21/2017 ----------------------------------------------------------------------  OBSTETRICS REPORT                      (Signed Final 02/21/2017 03:51 pm) ---------------------------------------------------------------------- Patient Info  ID #:       [id number]                          D.O.B.:  1990/11/11 (25 yrs)  Name:       Ernst Spell DUNSTON              Visit Date: 02/21/2017 03:23 pm ---------------------------------------------------------------------- Performed By  Performed By:     Emeline Darling BS,      Ref. Address:     [facility]                                                             OB[facility]                                                             [address]                                                             [place], [place]                                                             [address]  Attending:        Charlsie Merles MD         Location:         [facility]  Referred By:      [facility] for                    [facility]                    Healthcare ---------------------------------------------------------------------- Orders   #  Description  Code   1  [place] MFM OB FOLLOW UP                          [id number]  ----------------------------------------------------------------------   #  Ordered By               Order #        Accession #    Episode #   1  Coral Ceo           [id number]      [id number]     [id number]  ---------------------------------------------------------------------- Indications   [redacted] weeks gestation of pregnancy                Z3A.32   Obesity complicating pregnancy, second         O99.212   trimester(pre-pregnancy BMI 31)   Gestational diabetes in pregnancy, diet        O24.410   controlled  ---------------------------------------------------------------------- OB History  Blood Type:            Height:  5'3"   Weight (lb):  190       BMI:  33.65  Gravidity:    3         Term:   1         SAB:   1  Living:       1 ---------------------------------------------------------------------- Fetal Evaluation  Num Of Fetuses:     1  Fetal Heart         141  Rate(bpm):  Cardiac Activity:   Observed  Presentation:       Cephalic  Placenta:           Posterior, above cervical os  P. Cord Insertion:  Previously Visualized  Amniotic Fluid  AFI FV:      Subjectively within normal limits  AFI Sum(cm)     %Tile       Largest Pocket(cm)  17.83           66          5.58  RUQ(cm)       RLQ(cm)       LUQ(cm)        LLQ(cm)  4.99          3.64          3.62           5.58 ---------------------------------------------------------------------- Biometry  BPD:      87.6  mm     G. Age:  35w 3d       > 99  %    CI:         81.4   %    70 - 86                                                          FL/HC:      21.2   %    19.1 - 21.3  HC:      306.5  mm     G. Age:  34w 1d         71  %    HC/AC:      0.97        0.96 - 1.17  AC:      314.8  mm     G. Age:  35w 3d       > 97  %    FL/BPD:     74.3   %    71 - 87  FL:       65.1  mm     G. Age:  33w 4d         78  %    FL/AC:      20.7   %    20 - 24  Est. FW:    2514  gm      5 lb 9 oz   > 90  % ----------------------------------------------------------------------  Gestational Age  LMP:           32w 0d        Date:  07/12/16                 EDD:   04/18/17  U/S Today:     34w 5d                                        EDD:   03/30/17  Best:          Armida Sans 0d     Det. By:  LMP  (07/12/16)          EDD:   04/18/17 ---------------------------------------------------------------------- Anatomy  Cranium:               Appears normal         Aortic Arch:            Previously seen  Cavum:                 Previously seen        Ductal Arch:            Previously seen  Ventricles:            Appears normal         Diaphragm:              Previously seen  Choroid Plexus:        Previously seen        Stomach:                Appears normal, left                                                                        sided  Cerebellum:            Previously seen        Abdomen:                Appears normal  Posterior Fossa:       Previously seen        Abdominal Wall:         Previously seen  Nuchal Fold:           Previously seen        Cord Vessels:           Previously seen  Face:  Orbits and profile     Kidneys:                Previously seen                         previously seen  Lips:                  Previously seen        Bladder:                Appears normal  Thoracic:              Appears normal         Spine:                  Previously seen  Heart:                 Previously seen        Upper Extremities:      Previously seen  RVOT:                  Previously seen        Lower Extremities:      Previously seen  LVOT:                  Previously seen  Other:  Female gender. Heels and 5th digit previously visualized. Technically          difficult due to maternal habitus and fetal position. ---------------------------------------------------------------------- Cervix Uterus Adnexa  Cervix  Not visualized (advanced GA >29wks) ---------------------------------------------------------------------- Impression  Single IUP at 32+0 weeks with GDM  Normal interval  review of fetal anatomy  Posterior placenta without previa  Normal amniotic fluid volume  Growth shows an LGA fetus at >90th percentile ---------------------------------------------------------------------- Recommendations  Recommend follow-up ultrasound examination in 4 weeks to  reassess growth ----------------------------------------------------------------------                 Charlsie Merles, MD Electronically Signed Final Report   02/21/2017 03:51 pm ----------------------------------------------------------------------   MAU Course/MDM: I have ordered labs and reviewed results. Urine negative NST reviewed and found reactive, Category I Consult Dr Vergie Living with presentation, exam findings and test results.  Treatments in MAU included EFM.    Assessment: Single IUP at [redacted]w[redacted]d Infrequent uterine contractions  Headache, tension vs migraine  Plan: Discharge home Rx Fioricet for headache. Discussed we cannot give it and let her drive.  Offered it if someone picks her up .  She wants Rx Preterm Labor precautions and fetal kick counts Follow up in Office for prenatal visits and recheck of status No evidence of preeclampsia Encouraged to return here or to other Urgent Care/ED if she develops worsening of symptoms, increase in pain, fever, or other concerning symptoms.   Pt stable at time of discharge.  [name] CNM, MSN Certified Nurse-Midwife 02/24/2017 9:05 PM

## 2017-02-24 NOTE — Discharge Instructions (Signed)
Preterm Labor and Birth Information The normal length of a pregnancy is 39-41 weeks. Preterm labor is when labor starts before 37 completed weeks of pregnancy. What are the risk factors for preterm labor? Preterm labor is more likely to occur in women who:  Have certain infections during pregnancy such as a bladder infection, sexually transmitted infection, or infection inside the uterus (chorioamnionitis).  Have a shorter-than-normal cervix.  Have gone into preterm labor before.  Have had surgery on their cervix.  Are younger than age 89 or older than age 19.  Are African American.  Are pregnant with twins or multiple babies (multiple gestation).  Take street drugs or smoke while pregnant.  Do not gain enough weight while pregnant.  Became pregnant shortly after having been pregnant.  What are the symptoms of preterm labor? Symptoms of preterm labor include:  Cramps similar to those that can happen during a menstrual period. The cramps may happen with diarrhea.  Pain in the abdomen or lower back.  Regular uterine contractions that may feel like tightening of the abdomen.  A feeling of increased pressure in the pelvis.  Increased watery or bloody mucus discharge from the vagina.  Water breaking (ruptured amniotic sac).  Why is it important to recognize signs of preterm labor? It is important to recognize signs of preterm labor because babies who are born prematurely may not be fully developed. This can put them at an increased risk for:  Long-term (chronic) heart and lung problems.  Difficulty immediately after birth with regulating body systems, including blood sugar, body temperature, heart rate, and breathing rate.  Bleeding in the brain.  Cerebral palsy.  Learning difficulties.  Death.  These risks are highest for babies who are born before 37 weeks of pregnancy. How is preterm labor treated? Treatment depends on the length of your pregnancy, your  condition, and the health of your baby. It may involve:  Having a stitch (suture) placed in your cervix to prevent your cervix from opening too early (cerclage).  Taking or being given medicines, such as: ? Hormone medicines. These may be given early in pregnancy to help support the pregnancy. ? Medicine to stop contractions. ? Medicines to help mature the babys lungs. These may be prescribed if the risk of delivery is high. ? Medicines to prevent your baby from developing cerebral palsy.  If the labor happens before 34 weeks of pregnancy, you may need to stay in the hospital. What should I do if I think I am in preterm labor? If you think that you are going into preterm labor, call your health care provider right away. How can I prevent preterm labor in future pregnancies? To increase your chance of having a full-term pregnancy:  Do not use any tobacco products, such as cigarettes, chewing tobacco, and e-cigarettes. If you need help quitting, ask your health care provider.  Do not use street drugs or medicines that have not been prescribed to you during your pregnancy.  Talk with your health care provider before taking any herbal supplements, even if you have been taking them regularly.  Make sure you gain a healthy amount of weight during your pregnancy.  Watch for infection. If you think that you might have an infection, get it checked right away.  Make sure to tell your health care provider if you have gone into preterm labor before.  This information is not intended to replace advice given to you by your health care provider. Make sure you discuss any questions  you have with your health care provider. Document Released: 07/03/2003 Document Revised: 09/23/2015 Document Reviewed: 09/03/2015 Elsevier Interactive Patient Education  2018 [employer] General Headache Without Cause A headache is pain or discomfort felt around the head or neck area. The specific cause of a headache  may not be found. There are many causes and types of headaches. A few common ones are:  Tension headaches.  Migraine headaches.  Cluster headaches.  Chronic daily headaches.  Follow these instructions at home: Watch your condition for any changes. Take these steps to help with your condition: Managing pain  Take over-the-counter and prescription medicines only as told by your health care provider.  Lie down in a dark, quiet room when you have a headache.  If directed, apply ice to the head and neck area: ? Put ice in a plastic bag. ? Place a towel between your skin and the bag. ? Leave the ice on for 20 minutes, 2-3 times per day.  Use a heating pad or hot shower to apply heat to the head and neck area as told by your health care provider.  Keep lights dim if bright lights bother you or make your headaches worse. Eating and drinking  Eat meals on a regular schedule.  Limit alcohol use.  Decrease the amount of caffeine you drink, or stop drinking caffeine. General instructions  Keep all follow-up visits as told by your health care provider. This is important.  Keep a headache journal to help find out what may trigger your headaches. For example, write down: ? What you eat and drink. ? How much sleep you get. ? Any change to your diet or medicines.  Try massage or other relaxation techniques.  Limit stress.  Sit up straight, and do not tense your muscles.  Do not use tobacco products, including cigarettes, chewing tobacco, or e-cigarettes. If you need help quitting, ask your health care provider.  Exercise regularly as told by your health care provider.  Sleep on a regular schedule. Get 7-9 hours of sleep, or the amount recommended by your health care provider. Contact a health care provider if:  Your symptoms are not helped by medicine.  You have a headache that is different from the usual headache.  You have nausea or you vomit.  You have a fever. Get  help right away if:  Your headache becomes severe.  You have repeated vomiting.  You have a stiff neck.  You have a loss of vision.  You have problems with speech.  You have pain in the eye or ear.  You have muscular weakness or loss of muscle control.  You lose your balance or have trouble walking.  You feel faint or pass out.  You have confusion. This information is not intended to replace advice given to you by your health care provider. Make sure you discuss any questions you have with your health care provider. Document Released: 04/12/2005 Document Revised: 09/18/2015 Document Reviewed: 08/05/2014 Elsevier Interactive Patient Education  2017 [employer]

## 2017-03-07 ENCOUNTER — Encounter: Payer: Self-pay | Admitting: Obstetrics

## 2017-03-07 ENCOUNTER — Ambulatory Visit (INDEPENDENT_AMBULATORY_CARE_PROVIDER_SITE_OTHER): Payer: Medicaid Other | Admitting: Obstetrics

## 2017-03-07 VITALS — BP 104/71 | HR 116 | Wt 212.4 lb

## 2017-03-07 DIAGNOSIS — O3663X0 Maternal care for excessive fetal growth, third trimester, not applicable or unspecified: Secondary | ICD-10-CM | POA: Insufficient documentation

## 2017-03-07 DIAGNOSIS — O24410 Gestational diabetes mellitus in pregnancy, diet controlled: Secondary | ICD-10-CM

## 2017-03-07 DIAGNOSIS — O0990 Supervision of high risk pregnancy, unspecified, unspecified trimester: Secondary | ICD-10-CM

## 2017-03-07 DIAGNOSIS — O0993 Supervision of high risk pregnancy, unspecified, third trimester: Secondary | ICD-10-CM
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
## 2017-03-21 ENCOUNTER — Encounter: Payer: Medicaid Other | Admitting: Obstetrics

## 2017-03-28 ENCOUNTER — Ambulatory Visit (HOSPITAL_COMMUNITY)
Admission: RE | Admit: 2017-03-28 | Discharge: 2017-03-28 | Disposition: A | Discharge status: Home / Self Care | Payer: Medicaid Other | Source: Ambulatory Visit | Attending: Obstetrics | Admitting: Obstetrics

## 2017-03-28 ENCOUNTER — Encounter: Payer: Self-pay | Admitting: Obstetrics

## 2017-03-28 ENCOUNTER — Other Ambulatory Visit: Payer: Self-pay

## 2017-03-28 ENCOUNTER — Other Ambulatory Visit: Payer: Self-pay | Admitting: Obstetrics

## 2017-03-28 ENCOUNTER — Ambulatory Visit (INDEPENDENT_AMBULATORY_CARE_PROVIDER_SITE_OTHER): Payer: Medicaid Other | Admitting: Obstetrics

## 2017-03-28 VITALS — BP 129/75 | HR 113 | Wt 219.0 lb

## 2017-03-28 DIAGNOSIS — Z3A37 37 weeks gestation of pregnancy: Secondary | ICD-10-CM | POA: Insufficient documentation

## 2017-03-28 DIAGNOSIS — O3663X0 Maternal care for excessive fetal growth, third trimester, not applicable or unspecified: Secondary | ICD-10-CM | POA: Diagnosis not present

## 2017-03-28 DIAGNOSIS — Z3689 Encounter for other specified antenatal screening: Secondary | ICD-10-CM | POA: Insufficient documentation

## 2017-03-28 DIAGNOSIS — O99213 Obesity complicating pregnancy, third trimester: Secondary | ICD-10-CM | POA: Diagnosis not present

## 2017-03-28 DIAGNOSIS — O24410 Gestational diabetes mellitus in pregnancy, diet controlled: Secondary | ICD-10-CM | POA: Insufficient documentation

## 2017-03-28 DIAGNOSIS — Z362 Encounter for other antenatal screening follow-up: Secondary | ICD-10-CM | POA: Insufficient documentation

## 2017-03-28 DIAGNOSIS — O0990 Supervision of high risk pregnancy, unspecified, unspecified trimester: Secondary | ICD-10-CM

## 2017-03-28 DIAGNOSIS — Z3480 Encounter for supervision of other normal pregnancy, unspecified trimester: Secondary | ICD-10-CM

## 2017-03-28 DIAGNOSIS — O0993 Supervision of high risk pregnancy, unspecified, third trimester: Secondary | ICD-10-CM

## 2017-03-28 NOTE — Progress Notes (Signed)
Complains of back pain, pressure, contractions 20-30 mins apart and left leg pains x 7 days

## 2017-03-29 ENCOUNTER — Encounter: Payer: Self-pay | Admitting: Obstetrics

## 2017-03-29 LAB — OB RESULTS CONSOLE GBS: STREP GROUP B AG: NEGATIVE
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
## 2017-03-30 LAB — STREP GP B NAA: Strep Gp B NAA: NEGATIVE

## 2017-04-04 ENCOUNTER — Encounter: Payer: Self-pay | Admitting: Obstetrics and Gynecology

## 2017-04-04 ENCOUNTER — Other Ambulatory Visit: Payer: Medicaid Other

## 2017-04-05 ENCOUNTER — Other Ambulatory Visit: Payer: Self-pay | Admitting: Advanced Practice Midwife

## 2017-04-06 ENCOUNTER — Encounter (HOSPITAL_COMMUNITY): Payer: Self-pay | Admitting: *Deleted

## 2017-04-06 ENCOUNTER — Telehealth (HOSPITAL_COMMUNITY): Payer: Self-pay | Admitting: *Deleted

## 2017-04-06 NOTE — Telephone Encounter (Signed)
Preadmission screen  

## 2017-04-07 ENCOUNTER — Encounter: Payer: Self-pay | Admitting: Obstetrics and Gynecology

## 2017-04-07 ENCOUNTER — Ambulatory Visit (INDEPENDENT_AMBULATORY_CARE_PROVIDER_SITE_OTHER): Payer: Medicaid Other | Admitting: Obstetrics and Gynecology

## 2017-04-07 ENCOUNTER — Other Ambulatory Visit: Payer: Medicaid Other

## 2017-04-07 VITALS — BP 126/81 | HR 108 | Wt 220.1 lb

## 2017-04-07 DIAGNOSIS — Z3480 Encounter for supervision of other normal pregnancy, unspecified trimester: Secondary | ICD-10-CM

## 2017-04-07 DIAGNOSIS — O24410 Gestational diabetes mellitus in pregnancy, diet controlled: Secondary | ICD-10-CM | POA: Diagnosis not present

## 2017-04-07 DIAGNOSIS — O3663X0 Maternal care for excessive fetal growth, third trimester, not applicable or unspecified: Secondary | ICD-10-CM
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
## 2017-04-07 NOTE — Progress Notes (Signed)
Patient reports good fetal movement with pressure and irregular contractions. 

## 2017-04-10 ENCOUNTER — Inpatient Hospital Stay (HOSPITAL_COMMUNITY): Payer: Medicaid Other | Admitting: Anesthesiology

## 2017-04-10 ENCOUNTER — Inpatient Hospital Stay (HOSPITAL_COMMUNITY)
Admission: RE | Admit: 2017-04-10 | Discharge: 2017-04-12 | DRG: 807 | Disposition: A | Discharge status: Home / Self Care | Payer: Medicaid Other | Source: Ambulatory Visit | Attending: Obstetrics & Gynecology | Admitting: Obstetrics & Gynecology

## 2017-04-10 ENCOUNTER — Encounter (HOSPITAL_COMMUNITY): Payer: Self-pay

## 2017-04-10 DIAGNOSIS — Z3A39 39 weeks gestation of pregnancy: Secondary | ICD-10-CM

## 2017-04-10 DIAGNOSIS — Z8759 Personal history of other complications of pregnancy, childbirth and the puerperium: Secondary | ICD-10-CM

## 2017-04-10 DIAGNOSIS — O3663X0 Maternal care for excessive fetal growth, third trimester, not applicable or unspecified: Secondary | ICD-10-CM | POA: Diagnosis present

## 2017-04-10 DIAGNOSIS — O24410 Gestational diabetes mellitus in pregnancy, diet controlled: Secondary | ICD-10-CM | POA: Diagnosis not present

## 2017-04-10 DIAGNOSIS — O9902 Anemia complicating childbirth: Secondary | ICD-10-CM | POA: Diagnosis present

## 2017-04-10 DIAGNOSIS — D573 Sickle-cell trait: Secondary | ICD-10-CM | POA: Diagnosis present

## 2017-04-10 DIAGNOSIS — O134 Gestational [pregnancy-induced] hypertension without significant proteinuria, complicating childbirth: Secondary | ICD-10-CM | POA: Diagnosis present

## 2017-04-10 DIAGNOSIS — O26813 Pregnancy related exhaustion and fatigue, third trimester: Secondary | ICD-10-CM | POA: Diagnosis present

## 2017-04-10 DIAGNOSIS — O9962 Diseases of the digestive system complicating childbirth: Secondary | ICD-10-CM | POA: Diagnosis present

## 2017-04-10 DIAGNOSIS — K259 Gastric ulcer, unspecified as acute or chronic, without hemorrhage or perforation: Secondary | ICD-10-CM | POA: Diagnosis present

## 2017-04-10 DIAGNOSIS — O24419 Gestational diabetes mellitus in pregnancy, unspecified control: Secondary | ICD-10-CM | POA: Diagnosis present

## 2017-04-10 DIAGNOSIS — O24420 Gestational diabetes mellitus in childbirth, diet controlled: Principal | ICD-10-CM | POA: Diagnosis present

## 2017-04-10 DIAGNOSIS — O139 Gestational [pregnancy-induced] hypertension without significant proteinuria, unspecified trimester: Secondary | ICD-10-CM

## 2017-04-10 DIAGNOSIS — O480 Post-term pregnancy: Secondary | ICD-10-CM | POA: Diagnosis present

## 2017-04-10 DIAGNOSIS — Z3480 Encounter for supervision of other normal pregnancy, unspecified trimester: Secondary | ICD-10-CM

## 2017-04-10 LAB — URINALYSIS, ROUTINE W REFLEX MICROSCOPIC
BILIRUBIN URINE: NEGATIVE
GLUCOSE, UA: NEGATIVE mg/dL
HGB URINE DIPSTICK: NEGATIVE
Ketones, ur: NEGATIVE mg/dL
Leukocytes, UA: NEGATIVE
Nitrite: NEGATIVE
PH: 6 (ref 5.0–8.0)
Protein, ur: NEGATIVE mg/dL
SPECIFIC GRAVITY, URINE: 1.009 (ref 1.005–1.030)

## 2017-04-10 LAB — PROTEIN / CREATININE RATIO, URINE
Creatinine, Urine: 66 mg/dL
Creatinine, Urine: 41 mg/dL
PROTEIN CREATININE RATIO: 0.09 mg/mg{creat} (ref 0.00–0.15)
Protein Creatinine Ratio: 0.2 mg/mg{Cre} — ABNORMAL HIGH (ref 0.00–0.15)
TOTAL PROTEIN, URINE: 8 mg/dL
Total Protein, Urine: 6 mg/dL

## 2017-04-10 LAB — CBC
HCT: 29.8 % — ABNORMAL LOW (ref 36.0–46.0)
HCT: 30.1 % — ABNORMAL LOW (ref 36.0–46.0)
HEMOGLOBIN: 9.2 g/dL — AB (ref 12.0–15.0)
Hemoglobin: 9.1 g/dL — ABNORMAL LOW (ref 12.0–15.0)
MCH: 21.7 pg — ABNORMAL LOW (ref 26.0–34.0)
MCH: 21.6 pg — ABNORMAL LOW (ref 26.0–34.0)
MCHC: 30.5 g/dL (ref 30.0–36.0)
MCHC: 30.6 g/dL (ref 30.0–36.0)
MCV: 71 fL — AB (ref 78.0–100.0)
MCV: 70.8 fL — ABNORMAL LOW (ref 78.0–100.0)
PLATELETS: 177 10*3/uL (ref 150–400)
PLATELETS: 187 10*3/uL (ref 150–400)
RBC: 4.2 MIL/uL (ref 3.87–5.11)
RBC: 4.25 MIL/uL (ref 3.87–5.11)
RDW: 17.8 % — AB (ref 11.5–15.5)
RDW: 17.8 % — ABNORMAL HIGH (ref 11.5–15.5)
WBC: 12.2 10*3/uL — ABNORMAL HIGH (ref 4.0–10.5)
WBC: 15.2 10*3/uL — AB (ref 4.0–10.5)

## 2017-04-10 LAB — COMPREHENSIVE METABOLIC PANEL
ALBUMIN: 2.9 g/dL — AB (ref 3.5–5.0)
ALBUMIN: 2.6 g/dL — AB (ref 3.5–5.0)
ALK PHOS: 180 U/L — AB (ref 38–126)
ALT: 16 U/L (ref 14–54)
ALT: 16 U/L (ref 14–54)
ANION GAP: 9 (ref 5–15)
AST: 20 U/L (ref 15–41)
AST: 29 U/L (ref 15–41)
Alkaline Phosphatase: 170 U/L — ABNORMAL HIGH (ref 38–126)
Anion gap: 10 (ref 5–15)
BILIRUBIN TOTAL: 0.5 mg/dL (ref 0.3–1.2)
BUN: 6 mg/dL (ref 6–20)
BUN: 6 mg/dL (ref 6–20)
CALCIUM: 8.8 mg/dL — AB (ref 8.9–10.3)
CHLORIDE: 107 mmol/L (ref 101–111)
CO2: 19 mmol/L — ABNORMAL LOW (ref 22–32)
CO2: 18 mmol/L — AB (ref 22–32)
CREATININE: 0.71 mg/dL (ref 0.44–1.00)
Calcium: 8.6 mg/dL — ABNORMAL LOW (ref 8.9–10.3)
Chloride: 107 mmol/L (ref 101–111)
Creatinine, Ser: 0.7 mg/dL (ref 0.44–1.00)
GFR calc Af Amer: >60 mL/min (ref >60)
GFR calc non Af Amer: >60 mL/min (ref >60)
GFR calc non Af Amer: >60 mL/min (ref >60)
GLUCOSE: 99 mg/dL (ref 65–99)
GLUCOSE: 108 mg/dL — AB (ref 65–99)
Potassium: 3.6 mmol/L (ref 3.5–5.1)
Potassium: 3.5 mmol/L (ref 3.5–5.1)
Sodium: 135 mmol/L (ref 135–145)
Sodium: 135 mmol/L (ref 135–145)
TOTAL PROTEIN: 6.6 g/dL (ref 6.5–8.1)
Total Bilirubin: 0.7 mg/dL (ref 0.3–1.2)
Total Protein: 6.1 g/dL — ABNORMAL LOW (ref 6.5–8.1)

## 2017-04-10 LAB — GLUCOSE, CAPILLARY
Glucose-Capillary: 122 mg/dL — ABNORMAL HIGH (ref 65–99)
Glucose-Capillary: 120 mg/dL — ABNORMAL HIGH (ref 65–99)

## 2017-04-10 LAB — TYPE AND SCREEN
ABO/RH(D): O POS
Antibody Screen: NEGATIVE

## 2017-04-10 MED ORDER — DIPHENHYDRAMINE HCL 25 MG PO CAPS: 25.0000 mg | ORAL_CAPSULE | Freq: Four times a day (QID) | ORAL | Status: DC | PRN

## 2017-04-10 MED ORDER — OXYTOCIN 40 UNITS IN LACTATED RINGERS INFUSION - SIMPLE MED: 2.5000 [IU]/h | INTRAVENOUS | Status: DC

## 2017-04-10 MED ORDER — SODIUM CHLORIDE 0.9% FLUSH: 3.0000 mL | INTRAVENOUS | Status: DC | PRN

## 2017-04-10 MED ORDER — FENTANYL CITRATE (PF) 100 MCG/2ML IJ SOLN: 100.0000 ug | INTRAMUSCULAR | Status: DC | PRN

## 2017-04-10 MED ORDER — MISOPROSTOL 25 MCG QUARTER TABLET: 25.0000 ug | ORAL_TABLET | ORAL | Status: DC | PRN

## 2017-04-10 MED ORDER — OXYTOCIN BOLUS FROM INFUSION: 500.0000 mL | Freq: Once | INTRAVENOUS | Status: DC

## 2017-04-10 MED ORDER — BENZOCAINE-MENTHOL 20-0.5 % EX AERO
1.0000 "application " | INHALATION_SPRAY | CUTANEOUS | Status: DC | PRN
  Filled 2017-04-10: qty 56

## 2017-04-10 MED ORDER — ONDANSETRON HCL 4 MG/2ML IJ SOLN: 4.0000 mg | INTRAMUSCULAR | Status: DC | PRN

## 2017-04-10 MED ORDER — OXYCODONE-ACETAMINOPHEN 5-325 MG PO TABS: 2.0000 | ORAL_TABLET | ORAL | Status: DC | PRN

## 2017-04-10 MED ORDER — WITCH HAZEL-GLYCERIN EX PADS
1.0000 "application " | MEDICATED_PAD | CUTANEOUS | Status: DC | PRN
  Administered 2017-04-11: 1 via TOPICAL

## 2017-04-10 MED ORDER — LACTATED RINGERS IV SOLN: 500.0000 mL | INTRAVENOUS | Status: DC | PRN

## 2017-04-10 MED ORDER — SENNOSIDES-DOCUSATE SODIUM 8.6-50 MG PO TABS
2.0000 | ORAL_TABLET | ORAL | Status: DC
  Administered 2017-04-11 (×2): 2 via ORAL
  Filled 2017-04-10 (×2): qty 2

## 2017-04-10 MED ORDER — LIDOCAINE HCL (PF) 1 % IJ SOLN
INTRAMUSCULAR | Status: DC | PRN
  Administered 2017-04-10: 4 mL via EPIDURAL

## 2017-04-10 MED ORDER — PRENATAL MULTIVITAMIN CH
1.0000 | ORAL_TABLET | Freq: Every day | ORAL | Status: DC
  Filled 2017-04-10: qty 1

## 2017-04-10 MED ORDER — OXYTOCIN 10 UNIT/ML IJ SOLN
INTRAMUSCULAR | Status: AC
  Administered 2017-04-10: 10 [IU] via INTRAMUSCULAR
  Filled 2017-04-10: qty 1

## 2017-04-10 MED ORDER — ACETAMINOPHEN 325 MG PO TABS
650.0000 mg | ORAL_TABLET | ORAL | Status: DC | PRN
  Administered 2017-04-11 (×3): 650 mg via ORAL
  Filled 2017-04-10 (×3): qty 2

## 2017-04-10 MED ORDER — PHENYLEPHRINE 40 MCG/ML (10ML) SYRINGE FOR IV PUSH (FOR BLOOD PRESSURE SUPPORT)
80.0000 ug | PREFILLED_SYRINGE | INTRAVENOUS | Status: DC | PRN
  Filled 2017-04-10: qty 5

## 2017-04-10 MED ORDER — LIDOCAINE HCL (PF) 1 % IJ SOLN
30.0000 mL | INTRAMUSCULAR | Status: DC | PRN
  Filled 2017-04-10: qty 30

## 2017-04-10 MED ORDER — SOD CITRATE-CITRIC ACID 500-334 MG/5ML PO SOLN
30.0000 mL | ORAL | Status: DC | PRN
  Administered 2017-04-10: 30 mL via ORAL
  Filled 2017-04-10: qty 15

## 2017-04-10 MED ORDER — ONDANSETRON HCL 4 MG PO TABS: 4.0000 mg | ORAL_TABLET | ORAL | Status: DC | PRN

## 2017-04-10 MED ORDER — SODIUM CHLORIDE 0.9 % IV SOLN: 250.0000 mL | INTRAVENOUS | Status: DC | PRN

## 2017-04-10 MED ORDER — TETANUS-DIPHTH-ACELL PERTUSSIS 5-2.5-18.5 LF-MCG/0.5 IM SUSP: 0.5000 mL | Freq: Once | INTRAMUSCULAR | Status: DC

## 2017-04-10 MED ORDER — MEASLES, MUMPS & RUBELLA VAC ~~LOC~~ INJ
0.5000 mL | INJECTION | Freq: Once | SUBCUTANEOUS | Status: DC
  Filled 2017-04-10: qty 0.5

## 2017-04-10 MED ORDER — LACTATED RINGERS IV SOLN: INTRAVENOUS | Status: DC

## 2017-04-10 MED ORDER — SOD CITRATE-CITRIC ACID 500-334 MG/5ML PO SOLN
30.0000 mL | ORAL | Status: DC | PRN
Start: 2017-04-10 — End: 2017-04-10

## 2017-04-10 MED ORDER — OXYCODONE-ACETAMINOPHEN 5-325 MG PO TABS: 1.0000 | ORAL_TABLET | ORAL | Status: DC | PRN

## 2017-04-10 MED ORDER — SIMETHICONE 80 MG PO CHEW: 80.0000 mg | CHEWABLE_TABLET | ORAL | Status: DC | PRN

## 2017-04-10 MED ORDER — OXYTOCIN 40 UNITS IN LACTATED RINGERS INFUSION - SIMPLE MED
1.0000 m[IU]/min | INTRAVENOUS | Status: DC
  Administered 2017-04-10: 2 m[IU]/min via INTRAVENOUS
  Administered 2017-04-10: 16 m[IU]/min via INTRAVENOUS
  Filled 2017-04-10: qty 1000

## 2017-04-10 MED ORDER — PHENYLEPHRINE 40 MCG/ML (10ML) SYRINGE FOR IV PUSH (FOR BLOOD PRESSURE SUPPORT)
80.0000 ug | PREFILLED_SYRINGE | INTRAVENOUS | Status: DC | PRN
  Filled 2017-04-10: qty 10
  Filled 2017-04-10: qty 5

## 2017-04-10 MED ORDER — ONDANSETRON HCL 4 MG/2ML IJ SOLN: 4.0000 mg | Freq: Four times a day (QID) | INTRAMUSCULAR | Status: DC | PRN

## 2017-04-10 MED ORDER — DIPHENHYDRAMINE HCL 50 MG/ML IJ SOLN
12.5000 mg | INTRAMUSCULAR | Status: DC | PRN
  Administered 2017-04-10: 12.5 mg via INTRAVENOUS
  Filled 2017-04-10: qty 1

## 2017-04-10 MED ORDER — FENTANYL 2.5 MCG/ML BUPIVACAINE 1/10 % EPIDURAL INFUSION (WH - ANES)
14.0000 mL/h | INTRAMUSCULAR | Status: DC | PRN
Start: 2017-04-10 — End: 2017-04-10
  Administered 2017-04-10 (×2): 14 mL/h via EPIDURAL
  Filled 2017-04-10 (×2): qty 100

## 2017-04-10 MED ORDER — LACTATED RINGERS IV SOLN: 500.0000 mL | Freq: Once | INTRAVENOUS | Status: DC

## 2017-04-10 MED ORDER — IBUPROFEN 600 MG PO TABS: 600.0000 mg | ORAL_TABLET | Freq: Four times a day (QID) | ORAL | Status: DC

## 2017-04-10 MED ORDER — COCONUT OIL OIL: 1.0000 "application " | TOPICAL_OIL | Status: DC | PRN

## 2017-04-10 MED ORDER — OXYTOCIN 10 UNIT/ML IJ SOLN
10.0000 [IU] | Freq: Once | INTRAMUSCULAR | Status: AC
  Administered 2017-04-10: 10 [IU] via INTRAMUSCULAR

## 2017-04-10 MED ORDER — OXYTOCIN BOLUS FROM INFUSION
500.0000 mL | Freq: Once | INTRAVENOUS | Status: AC
  Administered 2017-04-10: 500 mL via INTRAVENOUS

## 2017-04-10 MED ORDER — TERBUTALINE SULFATE 1 MG/ML IJ SOLN: 0.2500 mg | Freq: Once | INTRAMUSCULAR | Status: DC | PRN

## 2017-04-10 MED ORDER — ACETAMINOPHEN 325 MG PO TABS
650.0000 mg | ORAL_TABLET | ORAL | Status: DC | PRN
  Administered 2017-04-10: 650 mg via ORAL
  Filled 2017-04-10: qty 2

## 2017-04-10 MED ORDER — EPHEDRINE 5 MG/ML INJ: 10.0000 mg | INTRAVENOUS | Status: DC | PRN

## 2017-04-10 MED ORDER — LACTATED RINGERS IV SOLN
INTRAVENOUS | Status: DC
  Administered 2017-04-10 (×2): via INTRAVENOUS

## 2017-04-10 MED ORDER — EPHEDRINE 5 MG/ML INJ
10.0000 mg | INTRAVENOUS | Status: DC | PRN
  Filled 2017-04-10: qty 2

## 2017-04-10 MED ORDER — OXYTOCIN 40 UNITS IN LACTATED RINGERS INFUSION - SIMPLE MED
2.5000 [IU]/h | INTRAVENOUS | Status: DC
  Administered 2017-04-10: 2.5 [IU]/h via INTRAVENOUS
  Filled 2017-04-10: qty 1000

## 2017-04-10 MED ORDER — ZOLPIDEM TARTRATE 5 MG PO TABS: 5.0000 mg | ORAL_TABLET | Freq: Every evening | ORAL | Status: DC | PRN

## 2017-04-10 MED ORDER — LIDOCAINE HCL (PF) 1 % IJ SOLN: 30.0000 mL | INTRAMUSCULAR | Status: DC | PRN

## 2017-04-10 MED ORDER — DIBUCAINE 1 % RE OINT: 1.0000 "application " | TOPICAL_OINTMENT | RECTAL | Status: DC | PRN

## 2017-04-10 MED ORDER — TERBUTALINE SULFATE 1 MG/ML IJ SOLN
0.2500 mg | Freq: Once | INTRAMUSCULAR | Status: DC | PRN
  Filled 2017-04-10: qty 1

## 2017-04-10 MED ORDER — ACETAMINOPHEN 325 MG PO TABS: 650.0000 mg | ORAL_TABLET | ORAL | Status: DC | PRN

## 2017-04-10 MED ORDER — SODIUM CHLORIDE 0.9% FLUSH: 3.0000 mL | Freq: Two times a day (BID) | INTRAVENOUS | Status: DC

## 2017-04-10 MED ORDER — OXYCODONE HCL 5 MG PO TABS
5.0000 mg | ORAL_TABLET | Freq: Once | ORAL | Status: AC
  Administered 2017-04-11: 5 mg via ORAL
  Filled 2017-04-10: qty 1
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
## 2017-04-10 NOTE — Anesthesia Preprocedure Evaluation (Signed)
Anesthesia Evaluation  Patient identified by MRN, date of birth, ID band Patient awake    Reviewed: Allergy & Precautions, Patient's Chart, lab work & pertinent test results  Airway Mallampati: II       Dental no notable dental hx.    Pulmonary neg pulmonary ROS,    Pulmonary exam normal        Cardiovascular hypertension, Normal cardiovascular exam     Neuro/Psych negative neurological ROS  negative psych ROS   GI/Hepatic PUD, GERD  Medicated,  Endo/Other  diabetes, Gestational  Renal/GU      Musculoskeletal   Abdominal   Peds  Hematology  (+) Sickle cell trait ,   Anesthesia Other Findings   Reproductive/Obstetrics (+) Pregnancy                             Anesthesia Physical Anesthesia Plan  ASA: III  Anesthesia Plan: Epidural   Post-op Pain Management:    Induction:   PONV Risk Score and Plan:   Airway Management Planned:   Additional Equipment:   Intra-op Plan:   Post-operative Plan:   Informed Consent: I have reviewed the patients History and Physical, chart, labs and discussed the procedure including the risks, benefits and alternatives for the proposed anesthesia with the patient or authorized representative who has indicated his/her understanding and acceptance.     Plan Discussed with:   Anesthesia Plan Comments: (Lab Results            PLT                      211                 02/07/2017           )        Anesthesia Quick Evaluation

## 2017-04-10 NOTE — Progress Notes (Signed)
[name] is a 26 y.o. G3P1011 at 3170w0d by ultrasound admitted for induction of labor due to Diabetes.  Subjective:   Objective: BP 131/87   Pulse 83   Temp 97.9 F (36.6 C) (Oral)   Resp 18   Ht 5\' 3"  (1.6 m)   Wt 220 lb (99.8 kg)   LMP 07/12/2016 (Exact Date)   BMI 38.97 kg/m  No intake/output data recorded. No intake/output data recorded.  FHT:  FHR: 135 bpm, variability: moderate,  accelerations:  Present,  decelerations:  Absent UC:   regular, every 5-6 minutes SVE:   Dilation: 4 Effacement (%): 50 Station: -2 Exam by:: Camelia EngK. Haynes, RN  Labs: Lab Results  Component Value Date   WBC 12.2 (H) 04/10/2017   HGB 9.1 (L) 04/10/2017   HCT 29.8 (L) 04/10/2017   MCV 71.0 (L) 04/10/2017   PLT 177 04/10/2017    Assessment / Plan: Induction of labor due to gestational diabetes,  progressing well on pitocin  Labor: Progressing normally Preeclampsia:  no signs or symptoms of toxicity Fetal Wellbeing:  Category I Pain Control:  Epidural I/D:  n/a Anticipated MOD:  NSVD  Wyvonnia DuskyMarie Proctor Carriker 04/10/2017, 2:41 PM

## 2017-04-10 NOTE — Progress Notes (Signed)
Pt. Refused 4 pm CBG.

## 2017-04-10 NOTE — Progress Notes (Signed)
Shakeisha A Rolena InfanteDunston is a 26 y.o. G3P1011 at 5373w0d by ultrasound admitted for induction of labor due to Diabetes.  Subjective:   Objective: BP 117/60   Pulse 85   Temp 98.5 F (36.9 C) (Oral)   Resp 18   Ht 5\' 3"  (1.6 m)   Wt 220 lb (99.8 kg)   LMP 07/12/2016 (Exact Date)   BMI 38.97 kg/m  No intake/output data recorded. No intake/output data recorded.  FHT:  FHR: 130 bpm, variability: moderate,  accelerations:  Present,  decelerations:  Present early UC:   irregular, every 4-6 minutes SVE:   Dilation: 4.5 Effacement (%): 50 Station: -2 Exam by:: Hart RochesterLawson, CNM  Labs: Lab Results  Component Value Date   WBC 12.2 (H) 04/10/2017   HGB 9.1 (L) 04/10/2017   HCT 29.8 (L) 04/10/2017   MCV 71.0 (L) 04/10/2017   PLT 177 04/10/2017    Assessment / Plan: Induction of labor due to postterm,  progressing well on pitocin  Labor: yet to be in labor Preeclampsia:  no signs or symptoms of toxicity Fetal Wellbeing:  Category II Pain Control:  Labor support without medications I/D:  n/a Anticipated MOD:  NSVD  Wyvonnia DuskyMarie Taffie Eckmann 04/10/2017, 5:12 PM

## 2017-04-10 NOTE — Anesthesia Procedure Notes (Signed)
Epidural Patient location during procedure: OB Start time: 04/10/2017 9:33 AM End time: 04/10/2017 9:40 AM  Staffing Anesthesiologist: Shelton SilvasHollis, Cyril Woodmansee D, MD Performed: anesthesiologist   Preanesthetic Checklist Completed: patient identified, site marked, surgical consent, pre-op evaluation, timeout performed, IV checked, risks and benefits discussed and monitors and equipment checked  Epidural Patient position: sitting Prep: ChloraPrep Patient monitoring: heart rate, continuous pulse ox and blood pressure Approach: midline Location: L3-L4 Injection technique: LOR saline  Needle:  Needle type: Tuohy  Needle gauge: 17 G Needle length: 9 cm Catheter type: closed end flexible Catheter size: 20 Guage Test dose: negative and 1.5% lidocaine  Assessment Events: blood not aspirated, injection not painful, no injection resistance and no paresthesia  Additional Notes LOR @ 5.5  Patient identified. Risks/Benefits/Options discussed with patient including but not limited to bleeding, infection, nerve damage, paralysis, failed block, incomplete pain control, headache, blood pressure changes, nausea, vomiting, reactions to medications, itching and postpartum back pain. Confirmed with bedside nurse the patient's most recent platelet count. Confirmed with patient that they are not currently taking any anticoagulation, have any bleeding history or any family history of bleeding disorders. Patient expressed understanding and wished to proceed. All questions were answered. Sterile technique was used throughout the entire procedure. Please see nursing notes for vital signs. Test dose was given through epidural catheter and negative prior to continuing to dose epidural or start infusion. Warning signs of high block given to the patient including shortness of breath, tingling/numbness in hands, complete motor block, or any concerning symptoms with instructions to call for help. Patient was given instructions  on fall risk and not to get out of bed. All questions and concerns addressed with instructions to call with any issues or inadequate analgesia.    Reason for block:procedure for pain

## 2017-04-10 NOTE — Anesthesia Pain Management Evaluation Note (Signed)
  CRNA Pain Management Visit Note  Patient: [name], 26 y.o., female  "Hello I am a [facility] of the anesthesia [facility] at [facility]. We have an anesthesia team available at all times to provide care throughout the hospital, including epidural management and anesthesia for C-section. I don't know your plan for the delivery whether it a natural birth, water birth, IV sedation, nitrous supplementation, doula or epidural, but we want to meet your pain goals."   1.Was your pain managed to your expectations on prior hospitalizations?   Yes   2.What is your expectation for pain management during this hospitalization?     Epidural  3.How can we help you reach that goal? Patient would like a C/S she is open to an epidural for her trial of labor  Record the patient's initial score and the patient's pain goal.   Pain: 10  Pain Goal: 10 [facility] wants you to be able to say your pain was always managed very well.  [name],[name] 04/10/2017

## 2017-04-10 NOTE — Progress Notes (Signed)
Admission nutrition screen triggered for unintentional weight loss > 10 lbs within the last month. PNR indicates that there has been no weight loss  Filed Weights   04/10/17 0740  Weight: 220 lb (99.8 kg)    . Patients chart reviewed and assessed  for nutritional risk. Patient is determined to be at low nutrition  risk.

## 2017-04-11 ENCOUNTER — Other Ambulatory Visit: Payer: Self-pay

## 2017-04-11 DIAGNOSIS — O139 Gestational [pregnancy-induced] hypertension without significant proteinuria, unspecified trimester: Secondary | ICD-10-CM

## 2017-04-11 LAB — RPR: RPR: NONREACTIVE

## 2017-04-11 MED ORDER — TRAMADOL HCL 50 MG PO TABS
100.0000 mg | ORAL_TABLET | Freq: Four times a day (QID) | ORAL | Status: DC | PRN
  Administered 2017-04-11 – 2017-04-12 (×4): 100 mg via ORAL
  Filled 2017-04-11 (×4): qty 2

## 2017-04-11 MED ORDER — CYCLOBENZAPRINE HCL 5 MG PO TABS
7.5000 mg | ORAL_TABLET | Freq: Once | ORAL | Status: AC
  Administered 2017-04-11: 7.5 mg via ORAL
  Filled 2017-04-11: qty 1.5

## 2017-04-11 NOTE — Progress Notes (Signed)
POSTPARTUM PROGRESS NOTE  Post Partum Day 1  Subjective:  [name] is a 26 y.o. G3P1011 s/p VAVD at 432w1d.  No acute events overnight.  Pt denies problems with ambulating, voiding or po intake.  She denies nausea or vomiting.  Pain is poorly controlled. Cannot take NSAIDs. Had one dose of oxycodone 5 mg last night which did not help much.  Objective: Patient Vitals for the past 8 hrs:  BP Temp Temp src Pulse Resp Weight  04/11/17 0717 - - - - - 210 lb 7 oz (95.5 kg)  04/11/17 0500 136/70 97.9 F (36.6 C) Oral 93 16 -  04/11/17 0300 132/82 98.2 F (36.8 C) Oral 92 16 -   Physical Exam:  General: alert, cooperative and no distress Chest: no respiratory distress Heart:regular rate, distal pulses intact Abdomen: soft, nontender,  Uterine Fundus: firm, appropriately tender DVT Evaluation: No calf swelling or tenderness Extremities: no edema Skin: warm, dry  Recent Labs    04/10/17 0805 04/10/17 2120  HGB 9.1* 9.2*  HCT 29.8* 30.1*    Assessment/Plan: [name] is a 26 y.o. G3P1011 s/p VAVD at 6832w1d   PPD#1 - Pain not well-controlled. Will try Tramadol  Contraception: discussed LARCs Dispo: Plan for discharge tomorrow.   LOS: 1 day   Kandra NicolasJulie P DegeleMD 04/11/2017, 7:55 AM

## 2017-04-11 NOTE — Anesthesia Postprocedure Evaluation (Signed)
Anesthesia Post Note  Patient: [name]  Procedure(s) Performed: AN AD HOC LABOR EPIDURAL     Patient location during evaluation: Mother Baby Anesthesia Type: Epidural Level of consciousness: awake, awake and alert, oriented and patient cooperative Pain management: pain level controlled Vital Signs Assessment: post-procedure vital signs reviewed and stable Respiratory status: spontaneous breathing, nonlabored ventilation and respiratory function stable Cardiovascular status: stable Postop Assessment: no headache, no backache, patient able to bend at knees and no apparent nausea or vomiting Anesthetic complications: no    Last Vitals:  Vitals:   04/11/17 0300 04/11/17 0500  BP: 132/82 136/70  Pulse: 92 93  Resp: 16 16  Temp: 36.8 C 36.6 C    Last Pain:  Vitals:   04/11/17 0500  TempSrc: Oral  PainSc:    Pain Goal:                 [name]
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
## 2017-04-12 ENCOUNTER — Encounter (HOSPITAL_COMMUNITY): Payer: Self-pay | Admitting: *Deleted

## 2017-04-12 DIAGNOSIS — Z8759 Personal history of other complications of pregnancy, childbirth and the puerperium: Secondary | ICD-10-CM

## 2017-04-12 MED ORDER — PRENATAL MULTIVITAMIN CH: 1.0000 | ORAL_TABLET | Freq: Every day | ORAL | 0 refills | Status: DC

## 2017-04-12 MED ORDER — ACETAMINOPHEN 325 MG PO TABS: 650.0000 mg | ORAL_TABLET | Freq: Three times a day (TID) | ORAL | 0 refills | Status: DC

## 2017-04-12 MED ORDER — AMLODIPINE BESYLATE 5 MG PO TABS
5.0000 mg | ORAL_TABLET | Freq: Every day | ORAL | 0 refills | Status: DC
Start: 2017-04-12 — End: 2017-05-17

## 2017-04-12 MED ORDER — TRAMADOL HCL 50 MG PO TABS: 100.0000 mg | ORAL_TABLET | Freq: Four times a day (QID) | ORAL | 0 refills | Status: DC | PRN

## 2017-04-12 NOTE — Progress Notes (Signed)
The Pt reports loss of feeling to urinate and needs warm water to start urinating. Encouraged pt to discuss with the MD in the am if there is no improvement. Explained to pt that this can be normal post urinary catheters. No pain during urinating per pt.

## 2017-04-12 NOTE — Discharge Summary (Signed)
OB Discharge Summary     Patient Name: [name] DOB: 09/19/1990 MRN: [id number]  Date of admission: 04/10/2017 Delivering MD: Wyvonnia DuskyLAWSON, MARIE D   Date of discharge: 04/12/2017  Admitting diagnosis: 39 WK INDUCATION Intrauterine pregnancy: 6389w2d     Secondary diagnosis:  Principal Problem:   Status post vacuum-assisted vaginal delivery Active Problems:   Gestational diabetes   Gestational diabetes mellitus (GDM) affecting pregnancy, antepartum   Gestational HTN  Additional problems: stomach ulcer     Discharge diagnosis: Term Pregnancy Delivered                                                                                                Post partum procedures:n/a  Augmentation: Pitocin  Complications: vacuum assist  Hospital course:  Induction of Labor With Vaginal Delivery   26 y.o. yo G3P1011 at 5989w2d was admitted to the hospital 04/10/2017 for induction of labor.  Indication for induction: A1 DM.  Patient had an uncomplicated labor course as follows: Membrane Rupture Time/Date: 2:52 PM ,04/10/2017   Intrapartum Procedures: Episiotomy: None [1]                                         Lacerations:  2nd degree [3];Perineal [11]  Patient had delivery of a Viable infant.  Information for the patient's newborn:  [name], Boy [name] [[id number]][[id number]]  Delivery Method: Vag-Spont   04/10/2017  Details of delivery can be found in separate delivery note. Her blood pressures remained mildly elevated postpartum, so amlodipine 5 mg daily was prescribed prior to discharge. She will have a 2-week BP check. Patient is discharged home 04/12/17.  Physical exam  Vitals:   04/11/17 0500 04/11/17 0717 04/11/17 1927 04/12/17 0620  BP: 136/70  (!) 131/91 135/86  Pulse: 93  89 99  Resp: 16  18 18   Temp: 97.9 F (36.6 C)  98.1 F (36.7 C) 98.9 F (37.2 C)  TempSrc: Oral  Oral Axillary  Weight:  95.5 kg (210 lb 7 oz)  94.1 kg (207 lb 6 oz)  Height:       General: alert,  cooperative and no distress Lochia: appropriate Uterine Fundus: firm Incision: N/A DVT Evaluation: No evidence of DVT seen on physical exam. Labs: Lab Results  Component Value Date   WBC 15.2 (H) 04/10/2017   HGB 9.2 (L) 04/10/2017   HCT 30.1 (L) 04/10/2017   MCV 70.8 (L) 04/10/2017   PLT 187 04/10/2017   CMP Latest Ref Rng & Units 04/10/2017  Glucose 65 - 99 mg/dL 469(G108(H)  BUN 6 - 20 mg/dL 6  Creatinine 2.950.44 - 2.841.00 mg/dL 1.320.71  Sodium 440135 - 102145 mmol/L 135  Potassium 3.5 - 5.1 mmol/L 3.5  Chloride 101 - 111 mmol/L 107  CO2 22 - 32 mmol/L 18(L)  Calcium 8.9 - 10.3 mg/dL 7.2(Z8.6(L)  Total Protein 6.5 - 8.1 g/dL 6.1(L)  Total Bilirubin 0.3 - 1.2 mg/dL 0.7  Alkaline Phos 38 - 126 U/L 170(H)  AST 15 - 41 U/L 29  ALT 14 - 54 U/L 16    Discharge instruction: per After Visit Summary and "Baby and Me Booklet".  After visit meds:  Allergies as of 04/12/2017      Reactions   Nsaids    Diagnosed with a perforated duodenal ulcer due to NSAIDS      Medication List    TAKE these medications   acetaminophen 325 MG tablet Commonly known as:  TYLENOL Take 2 tablets (650 mg total) by mouth every 8 (eight) hours.   amLODipine 5 MG tablet Commonly known as:  NORVASC Take 1 tablet (5 mg total) by mouth daily.   Butalbital-APAP-Caffeine 50-325-40 MG capsule Take 1-2 capsules by mouth every 6 (six) hours as needed for headache.   calcium carbonate 500 MG chewable tablet Commonly known as:  TUMS - dosed in mg elemental calcium Chew 1 tablet by mouth 2 (two) times daily as needed for indigestion or heartburn.   cyclobenzaprine 10 MG tablet Commonly known as:  FLEXERIL Take 1 tablet (10 mg total) by mouth 2 (two) times daily as needed for muscle spasms.   ferrous sulfate 325 (65 FE) MG tablet Take 1 tablet (325 mg total) by mouth 2 (two) times daily with a meal.   pantoprazole 40 MG tablet Commonly known as:  PROTONIX Take 1 tablet (40 mg total) by mouth 2 (two) times daily.    prenatal multivitamin Tabs tablet Take 1 tablet by mouth daily at 12 noon.   PRENATE PIXIE 10-0.6-0.4-200 MG Caps Take 1 tablet by mouth daily.   traMADol 50 MG tablet Commonly known as:  ULTRAM Take 2 tablets (100 mg total) by mouth every 6 (six) hours as needed for severe pain.       Diet: carb modified diet  Activity: Advance as tolerated. Pelvic rest for 6 weeks.   Outpatient follow up: 2-week BP check (message sent to admin pool); 4 weeks PPV Follow up Appt: Future Appointments  Date Time Provider Department Center  05/09/2017 10:00 AM Constant, Gigi GinPeggy, MD [facility] None   Postpartum contraception: Undecided -- counseled  Newborn Data: Live born female  Birth Weight: 9 lb 7 oz (4280 g) APGAR: 9, 9  Newborn Delivery   Birth date/time:  04/10/2017 19:12:00 Delivery type:  Vaginal, Vacuum (Extractor)     Baby Feeding: Bottle and Breast Disposition:home with mother   04/12/2017 Sherry RollingScott Bland, DO  OB FELLOW DISCHARGE ATTESTATION I have seen and examined this patient and agree with above documentation in the resident's note.   Pt stable at time of discharge. Started on amlodipine 5 mg daily at time of d/c. 2-wk BP check PPV - will need 2-hr GTT due to GDM  Frederik PearJulie P Degele, MD OB Fellow 9:17 AM

## 2017-04-12 NOTE — Progress Notes (Addendum)
Stephanie Acrerisha Glime Rn had reported off to previous RN that Mom declined the tap. During report with another Rn , mom stated she did not tell Glime Rn that she declined the TDAP.  Mom asked current Rn if there were any side affects from The TDAP. So current RN gave mom information on the TDAP. Later after reading mom declined the TDAP.

## 2017-04-14 ENCOUNTER — Other Ambulatory Visit: Payer: Self-pay

## 2017-04-14 ENCOUNTER — Encounter (HOSPITAL_COMMUNITY): Payer: Self-pay

## 2017-04-14 DIAGNOSIS — R109 Unspecified abdominal pain: Secondary | ICD-10-CM | POA: Diagnosis not present

## 2017-04-14 DIAGNOSIS — Z5321 Procedure and treatment not carried out due to patient leaving prior to being seen by health care provider: Secondary | ICD-10-CM | POA: Insufficient documentation

## 2017-04-14 DIAGNOSIS — R0602 Shortness of breath: Secondary | ICD-10-CM | POA: Diagnosis not present

## 2017-04-14 LAB — CBC
HCT: 29.5 % — ABNORMAL LOW (ref 36.0–46.0)
Hemoglobin: 9.4 g/dL — ABNORMAL LOW (ref 12.0–15.0)
MCH: 22.2 pg — ABNORMAL LOW (ref 26.0–34.0)
MCHC: 31.9 g/dL (ref 30.0–36.0)
MCV: 69.6 fL — ABNORMAL LOW (ref 78.0–100.0)
Platelets: 216 10*3/uL (ref 150–400)
RBC: 4.24 MIL/uL (ref 3.87–5.11)
RDW: 18.2 % — AB (ref 11.5–15.5)
WBC: 10 10*3/uL (ref 4.0–10.5)

## 2017-04-14 LAB — I-STAT BETA HCG BLOOD, ED (MC, WL, AP ONLY): I-stat hCG, quantitative: 100.1 m[IU]/mL — ABNORMAL HIGH (ref <5)

## 2017-04-14 LAB — I-STAT CG4 LACTIC ACID, ED: LACTIC ACID, VENOUS: 1.69 mmol/L (ref 0.5–1.9)

## 2017-04-14 NOTE — ED Triage Notes (Signed)
Pt endorses RLQ pain and shob that began this morning. Pt gave birth 4 days ago. After the pt's last surgery the pt experiences the same sx and had to have emergency surgery for a perforated ulcer and organ failure. VSS.

## 2017-04-15 ENCOUNTER — Emergency Department (HOSPITAL_COMMUNITY)
Admission: EM | Admit: 2017-04-15 | Discharge: 2017-04-15 | Disposition: A | Discharge status: Home / Self Care | Payer: 59 | Attending: Emergency Medicine | Admitting: Emergency Medicine

## 2017-04-15 LAB — COMPREHENSIVE METABOLIC PANEL
ALBUMIN: 2.8 g/dL — AB (ref 3.5–5.0)
ALK PHOS: 127 U/L — AB (ref 38–126)
ALT: 27 U/L (ref 14–54)
ANION GAP: 7 (ref 5–15)
AST: 22 U/L (ref 15–41)
BILIRUBIN TOTAL: 0.4 mg/dL (ref 0.3–1.2)
BUN: 6 mg/dL (ref 6–20)
CALCIUM: 8.8 mg/dL — AB (ref 8.9–10.3)
CO2: 23 mmol/L (ref 22–32)
Chloride: 108 mmol/L (ref 101–111)
Creatinine, Ser: 0.85 mg/dL (ref 0.44–1.00)
GFR calc Af Amer: >60 mL/min (ref >60)
GFR calc non Af Amer: >60 mL/min (ref >60)
GLUCOSE: 87 mg/dL (ref 65–99)
Potassium: 3.7 mmol/L (ref 3.5–5.1)
Sodium: 138 mmol/L (ref 135–145)
TOTAL PROTEIN: 5.8 g/dL — AB (ref 6.5–8.1)

## 2017-04-15 LAB — URINALYSIS, ROUTINE W REFLEX MICROSCOPIC
BILIRUBIN URINE: NEGATIVE
Glucose, UA: NEGATIVE mg/dL
KETONES UR: NEGATIVE mg/dL
NITRITE: NEGATIVE
Protein, ur: 30 mg/dL — AB
Specific Gravity, Urine: 1.01 (ref 1.005–1.030)
pH: 7 (ref 5.0–8.0)

## 2017-04-15 LAB — URINALYSIS, MICROSCOPIC (REFLEX)

## 2017-04-15 LAB — LIPASE, BLOOD: Lipase: 22 U/L (ref 11–51)

## 2017-04-15 NOTE — ED Notes (Signed)
Pt decided to leave 

## 2017-05-05 ENCOUNTER — Telehealth: Payer: Self-pay

## 2017-05-05 NOTE — Telephone Encounter (Signed)
Contacted pt and advised paperwork complete and would be faxed.

## 2017-05-09 ENCOUNTER — Ambulatory Visit: Payer: Medicaid Other | Admitting: Obstetrics and Gynecology

## 2017-05-16 ENCOUNTER — Ambulatory Visit: Payer: Medicaid Other | Admitting: Obstetrics and Gynecology

## 2017-05-17 ENCOUNTER — Encounter: Payer: Self-pay | Admitting: *Deleted

## 2017-05-17 ENCOUNTER — Encounter: Payer: Self-pay | Admitting: Obstetrics and Gynecology

## 2017-05-17 ENCOUNTER — Ambulatory Visit (INDEPENDENT_AMBULATORY_CARE_PROVIDER_SITE_OTHER): Payer: 59 | Admitting: Obstetrics and Gynecology

## 2017-05-17 VITALS — BP 114/77 | HR 86 | Wt 198.9 lb

## 2017-05-17 DIAGNOSIS — Z1389 Encounter for screening for other disorder: Secondary | ICD-10-CM | POA: Diagnosis not present

## 2017-05-17 DIAGNOSIS — O24419 Gestational diabetes mellitus in pregnancy, unspecified control: Secondary | ICD-10-CM

## 2017-05-17 NOTE — Patient Instructions (Signed)

## 2017-05-17 NOTE — Progress Notes (Signed)
Pt is in the office for pp visit, states that she does not want BC  .Marland Kitchen.Post Partum Exam  [name] is a 27 y.o. 813P1011 female who presents for a postpartum visit. She is 5 weeks postpartum following a spontaneous vaginal delivery. I have fully reviewed the prenatal and intrapartum course. The delivery was at 39 gestational weeks.  Anesthesia: epidural. Postpartum course has been good. Baby's course has been good. Baby is feeding by bottle - Gerber Gentle. Bleeding no bleeding. Bowel function is normal. Bladder function is normal. Patient is not sexually active. Contraception method is none. Postpartum depression screening:neg  Pt had some elevated BP postpartum and started on Norvasc. Pt stopped taking several weeks ago, No HA or visual changes H/O A1 GDM  The following portions of the patient's history were reviewed and updated as appropriate: allergies, current medications, past family history, past medical history, past social history and past surgical history. Last pap smear done 09/2016 and was Normal  Review of Systems Pertinent items are noted in HPI.    Objective:  currently breastfeeding.  General:  alert   Breasts:  deferred  Lungs: clear to auscultation bilaterally  Heart:  regular rate and rhythm, S1, S2 normal, no murmur, click, rub or gallop  Abdomen: soft, non-tender; bowel sounds normal; no masses,  no organomegaly   Vulva:  not evaluated  Vagina: not evaluated  Cervix:  not evaluated  Corpus: not examined  Adnexa:  not evaluated  Rectal Exam: Not performed.        Assessment:    NL postpartum exam.  H/O GDM  H/O GHTN, BP nl without meds  Plan:   1. Contraception: condoms 2. Return to nl ADL's. Return to work note provided 3. Glucola in 2 weeks 4.. Follow up in: 1 year or as needed.

## 2017-05-30 ENCOUNTER — Other Ambulatory Visit: Payer: Medicaid Other

## 2017-07-18 ENCOUNTER — Ambulatory Visit: Payer: 59

## 2017-07-18 ENCOUNTER — Encounter: Payer: 59 | Admitting: Podiatry

## 2017-07-18 DIAGNOSIS — M79672 Pain in left foot: Principal | ICD-10-CM

## 2017-07-18 DIAGNOSIS — M79671 Pain in right foot: Secondary | ICD-10-CM

## 2017-07-18 NOTE — Progress Notes (Signed)
This encounter was created in error - please disregard.

## 2017-08-01 ENCOUNTER — Encounter: Payer: Self-pay | Admitting: Podiatry

## 2017-08-01 ENCOUNTER — Ambulatory Visit: Payer: 59 | Admitting: Podiatry

## 2017-08-01 ENCOUNTER — Ambulatory Visit (INDEPENDENT_AMBULATORY_CARE_PROVIDER_SITE_OTHER): Payer: 59

## 2017-08-01 DIAGNOSIS — M722 Plantar fascial fibromatosis: Secondary | ICD-10-CM

## 2017-08-01 MED ORDER — MELOXICAM 15 MG PO TABS: 15.0000 mg | ORAL_TABLET | Freq: Every day | ORAL | 1 refills | Status: AC

## 2017-08-01 MED ORDER — METHYLPREDNISOLONE 4 MG PO TBPK: ORAL_TABLET | ORAL | 0 refills | Status: DC

## 2017-08-03 NOTE — Progress Notes (Signed)
   Subjective: Patient presents today for pain and tenderness in the bilateral heels that has been ongoing for the past two years. She states that it hurts in the morning with the first steps out of bed. She has been wearing plantar fascial socks for treatment. Patient presents today for further treatment and evaluation.  Past Medical History:  Diagnosis Date  . Anemia   . Constipation   . Diabetes mellitus without complication (HCC)   . Gastric ulcer   . Gestational diabetes   . History of dysmenorrhea   . Hypertension   . Hypoglycemia   . Ovarian cyst 2011  . Pregnancy   . Pregnancy induced hypertension    previous pregnancy 2014  . Sickle cell trait (HCC)      Objective: Physical Exam General: The patient is alert and oriented x3 in no acute distress.  Dermatology: Skin is warm, dry and supple bilateral lower extremities. Negative for open lesions or macerations bilateral.   Vascular: Dorsalis Pedis and Posterior Tibial pulses palpable bilateral.  Capillary fill time is immediate to all digits.  Neurological: Epicritic and protective threshold intact bilateral.   Musculoskeletal: Tenderness to palpation to the plantar aspect of the bilateral heels along the plantar fascia. All other joints range of motion within normal limits bilateral. Strength 5/5 in all groups bilateral.   Radiographic exam: Normal osseous mineralization. Joint spaces preserved. No fracture/dislocation/boney destruction. No other soft tissue abnormalities or radiopaque foreign bodies.   Assessment: 1. plantar fasciitis bilateral feet  Plan of Care:  1. Patient evaluated. Xrays reviewed.   2. Injection of 0.5cc Celestone soluspan injected into the bilateral heels.  3. Rx for Medrol Dose Pak placed 4. Rx for Mobic provided to patient.  5. Plantar fascial band(s) dispensed for bilateral plantar fasciitis. 6. Instructed patient regarding therapies and modalities at home to alleviate symptoms.  7.  Return to clinic in 4 weeks.    Nature conservation officerperations manager.   Felecia ShellingBrent M. Joreen Swearingin, DPM [facility]  Dr. Felecia ShellingBrent M. Kippy Melena, DPM    [address]                                   [place], [place] [address]                Office [phone]  Fax [phone]

## 2017-09-05 ENCOUNTER — Encounter: Payer: 59 | Admitting: Podiatry

## 2017-09-13 NOTE — Progress Notes (Signed)
This encounter was created in error - please disregard.

## 2017-12-23 ENCOUNTER — Ambulatory Visit (INDEPENDENT_AMBULATORY_CARE_PROVIDER_SITE_OTHER): Payer: 59 | Admitting: Obstetrics

## 2017-12-23 ENCOUNTER — Encounter: Payer: Self-pay | Admitting: Obstetrics

## 2017-12-23 VITALS — Ht 63.0 in | Wt 193.2 lb

## 2017-12-23 DIAGNOSIS — Z640 Problems related to unwanted pregnancy: Secondary | ICD-10-CM | POA: Diagnosis not present
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
## 2017-12-23 NOTE — Progress Notes (Signed)
Patient is in the office reporting a positive UPT, LMP 11-06-17, 6w 5d today. Pt is wanting rx for abortion pill.

## 2018-02-03 ENCOUNTER — Ambulatory Visit: Payer: Medicaid Other | Admitting: Obstetrics

## 2018-05-27 ENCOUNTER — Encounter (HOSPITAL_COMMUNITY): Payer: Self-pay | Admitting: Emergency Medicine

## 2018-05-27 ENCOUNTER — Emergency Department (HOSPITAL_COMMUNITY)
Admission: EM | Admit: 2018-05-27 | Discharge: 2018-05-28 | Disposition: A | Discharge status: Home / Self Care | Payer: 59 | Attending: Emergency Medicine | Admitting: Emergency Medicine

## 2018-05-27 DIAGNOSIS — R112 Nausea with vomiting, unspecified: Secondary | ICD-10-CM | POA: Insufficient documentation

## 2018-05-27 DIAGNOSIS — R1084 Generalized abdominal pain: Secondary | ICD-10-CM | POA: Insufficient documentation

## 2018-05-27 DIAGNOSIS — E119 Type 2 diabetes mellitus without complications: Secondary | ICD-10-CM | POA: Diagnosis not present

## 2018-05-27 DIAGNOSIS — R197 Diarrhea, unspecified: Secondary | ICD-10-CM | POA: Diagnosis not present

## 2018-05-27 DIAGNOSIS — I10 Essential (primary) hypertension: Secondary | ICD-10-CM | POA: Insufficient documentation

## 2018-05-27 LAB — CBC WITH DIFFERENTIAL/PLATELET
Abs Immature Granulocytes: 0.02 10*3/uL (ref 0.00–0.07)
BASOS ABS: 0 10*3/uL (ref 0.0–0.1)
Basophils Relative: 0 %
EOS ABS: 0 10*3/uL (ref 0.0–0.5)
EOS PCT: 0 %
HCT: 40.4 % (ref 36.0–46.0)
Hemoglobin: 13.5 g/dL (ref 12.0–15.0)
Immature Granulocytes: 0 %
LYMPHS PCT: 8 %
Lymphs Abs: 0.7 10*3/uL (ref 0.7–4.0)
MCH: 27.1 pg (ref 26.0–34.0)
MCHC: 33.4 g/dL (ref 30.0–36.0)
MCV: 81.1 fL (ref 80.0–100.0)
Monocytes Absolute: 0.6 10*3/uL (ref 0.1–1.0)
Monocytes Relative: 7 %
NRBC: 0 % (ref 0.0–0.2)
Neutro Abs: 7.4 10*3/uL (ref 1.7–7.7)
Neutrophils Relative %: 85 %
PLATELETS: 227 10*3/uL (ref 150–400)
RBC: 4.98 MIL/uL (ref 3.87–5.11)
RDW: 13.9 % (ref 11.5–15.5)
WBC: 8.7 10*3/uL (ref 4.0–10.5)

## 2018-05-27 LAB — COMPREHENSIVE METABOLIC PANEL
ALBUMIN: 3.6 g/dL (ref 3.5–5.0)
ALT: 18 U/L (ref 0–44)
ANION GAP: 13 (ref 5–15)
AST: 27 U/L (ref 15–41)
Alkaline Phosphatase: 52 U/L (ref 38–126)
BUN: 8 mg/dL (ref 6–20)
CO2: 17 mmol/L — AB (ref 22–32)
Calcium: 8.4 mg/dL — ABNORMAL LOW (ref 8.9–10.3)
Chloride: 105 mmol/L (ref 98–111)
Creatinine, Ser: 0.87 mg/dL (ref 0.44–1.00)
GFR calc Af Amer: >60 mL/min (ref >60)
GFR calc non Af Amer: >60 mL/min (ref >60)
GLUCOSE: 95 mg/dL (ref 70–99)
POTASSIUM: 4.4 mmol/L (ref 3.5–5.1)
SODIUM: 135 mmol/L (ref 135–145)
Total Bilirubin: 1.5 mg/dL — ABNORMAL HIGH (ref 0.3–1.2)
Total Protein: 6.5 g/dL (ref 6.5–8.1)

## 2018-05-27 LAB — I-STAT BETA HCG BLOOD, ED (MC, WL, AP ONLY): I-stat hCG, quantitative: <5 m[IU]/mL (ref <5)

## 2018-05-27 LAB — LIPASE, BLOOD: Lipase: 21 U/L (ref 11–51)

## 2018-05-27 MED ORDER — FENTANYL CITRATE (PF) 100 MCG/2ML IJ SOLN
100.0000 ug | Freq: Once | INTRAMUSCULAR | Status: AC
  Administered 2018-05-27: 100 ug via INTRAVENOUS
  Filled 2018-05-27: qty 2

## 2018-05-27 MED ORDER — ONDANSETRON HCL 4 MG/2ML IJ SOLN
4.0000 mg | Freq: Once | INTRAMUSCULAR | Status: AC
  Administered 2018-05-27: 4 mg via INTRAVENOUS
  Filled 2018-05-27: qty 2
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
## 2018-05-27 NOTE — ED Notes (Signed)
Taken to CT at this time. 

## 2018-05-27 NOTE — ED Provider Notes (Signed)
28 [facility] old [facility] Per her HPI:   "[name] is a 28 y.o. female.  Who presents the emergency department chief complaint of acute epigastric abdominal pain, nausea vomiting and diarrhea.  Patient states that she awoke with epigastric abdominal pain that multiple episodes of nonbloody nonbilious vomitus and loose stools today.  She states that the pain is severe, nonradiating, colicky.  She states that it feels similar to the severe pain she had when she had a perforated gastric ulcer about 6 years ago.  Prior to today's onset of pain she has not had any postprandial or chronic epigastric abdominal pain.  She has a history of exploratory laparotomy but has never had any bowel obstructions in the past.  She denies fevers, chills."  Physical Exam  BP 116/65   Pulse (!) 101   Temp 98.8 F (37.1 C) (Oral)   Resp (!) 22   Ht 5\' 3"  (1.6 m)   Wt 90.7 kg   LMP 11/03/2017   SpO2 96%   BMI 35.43 kg/m   Physical Exam Vitals signs and nursing note reviewed.  Constitutional:      General: She is not in acute distress.    Comments: Well-appearing.  No acute distress.  HENT:     Head: Normocephalic.  Eyes:     Conjunctiva/sclera: Conjunctivae normal.  Neck:     Musculoskeletal: Neck supple.  Cardiovascular:     Rate and Rhythm: Normal rate and regular rhythm.     Heart sounds: No murmur. No friction rub. No gallop.   Pulmonary:     Effort: Pulmonary effort is normal. No respiratory distress.  Abdominal:     General: There is no distension.     Palpations: Abdomen is soft.  Musculoskeletal:     Comments: Ambulatory without difficulty  Skin:    General: Skin is warm.     Findings: No rash.  Neurological:     Mental Status: She is alert.  Psychiatric:        Behavior: Behavior normal.     ED Course/Procedures     Procedures  MDM  28 year old female received at PA Harris at signout pending CT of the abdomen and pelvis and  UA.  CT abdomen pelvis with mild fluid-filled distention of the small bowel loops, concerning for small bowel enteritis and diarrheal disease.  No other acute findings.  UA with moderate hemoglobinuria, likely secondary to the patient being on her menstrual cycle.  Not concerning for infection.  The patient was successfully fluid challenge.  We will discharge the patient home with antiemetics and recommended good hydration at home.  Given onset of symptoms, antibiotics indicated at this time.  Strict return precautions given.  She is hemodynamically stable and in no acute distress.  She is safe for discharge home with outpatient follow-up at this time.       Frederik PearMcDonald, Byanka Landrus A, PA-C 05/28/18 [phone]    Gilda CreasePollina, Christopher J, MD 05/29/18 [phone]

## 2018-05-27 NOTE — ED Triage Notes (Signed)
Pt reports gen abd pain, N/V/D onset 1030. Hx of gastric ulcer with perforation requiring surgery in 2014. Pt states pain feels similar.

## 2018-05-28 ENCOUNTER — Emergency Department (HOSPITAL_COMMUNITY): Payer: 59

## 2018-05-28 LAB — URINALYSIS, ROUTINE W REFLEX MICROSCOPIC
Bilirubin Urine: NEGATIVE
Glucose, UA: NEGATIVE mg/dL
KETONES UR: 5 mg/dL — AB
LEUKOCYTES UA: NEGATIVE
Nitrite: NEGATIVE
PH: 5 (ref 5.0–8.0)
Protein, ur: NEGATIVE mg/dL
Specific Gravity, Urine: >1.046 — ABNORMAL HIGH (ref 1.005–1.030)

## 2018-05-28 MED ORDER — PROMETHAZINE HCL 25 MG/ML IJ SOLN
12.5000 mg | Freq: Once | INTRAMUSCULAR | Status: AC
  Administered 2018-05-28: 12.5 mg via INTRAVENOUS
  Filled 2018-05-28: qty 1

## 2018-05-28 MED ORDER — IOHEXOL 300 MG/ML  SOLN
100.0000 mL | Freq: Once | INTRAMUSCULAR | Status: AC | PRN
  Administered 2018-05-28: 100 mL via INTRAVENOUS

## 2018-05-28 MED ORDER — ONDANSETRON 4 MG PO TBDP: 4.0000 mg | ORAL_TABLET | Freq: Three times a day (TID) | ORAL | 0 refills | Status: DC | PRN

## 2018-05-28 NOTE — Discharge Instructions (Signed)
Thank you for allowing me to care for you today in the Emergency Department.   Continue to drink plenty of fluids.  It is important to avoid dehydration.  Take 650 mg of Tylenol every 6 hours for pain control.  Let 1 tablet of Zofran dissolve in your tongue every 8 hours as needed for nausea and vomiting.  If your symptoms do not start to significantly improve in the next 48 to 72 hours, if you develop black or bloody vomiting or stool, severe, uncontrollable pain, or other new, concerning symptoms, please return to the emergency department for re-evaluation.

## 2018-05-28 NOTE — ED Notes (Signed)
Pt given fluid for PO challenge.

## 2018-10-11 IMAGING — US US MFM OB DETAIL+14 WK
1 series · 14 of 28 positions shown · non-contrast
Comparison: none

[Series 1: us mfm ob detail+14 wk · 69 acquisitions, 14 frames shown]
[im 3/69]
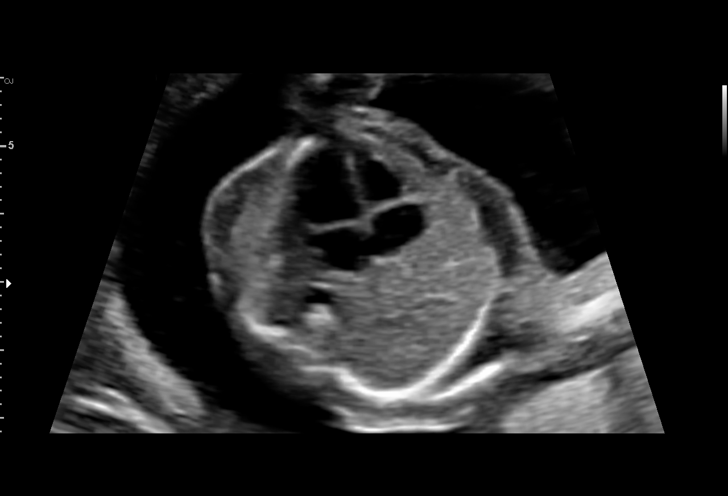
[im 8/69]
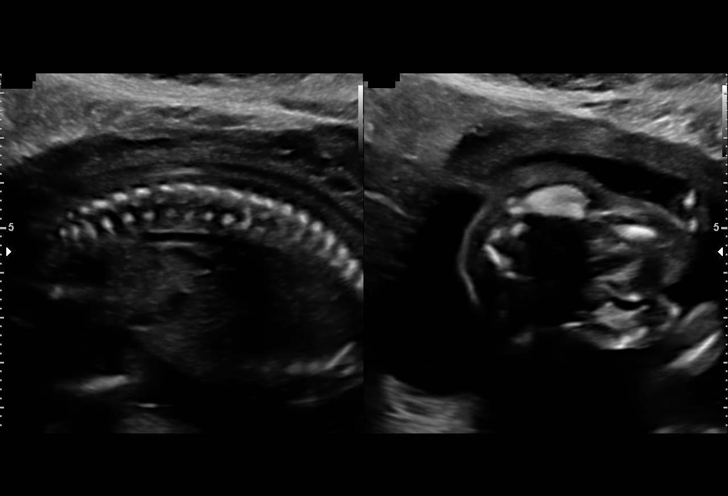
[im 13/69]
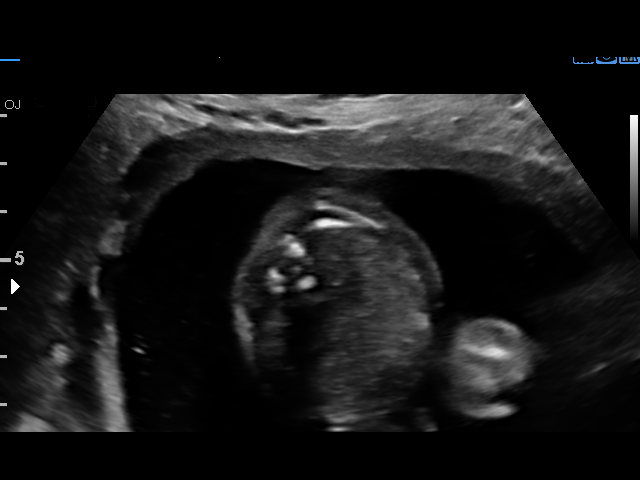
[im 18/69]
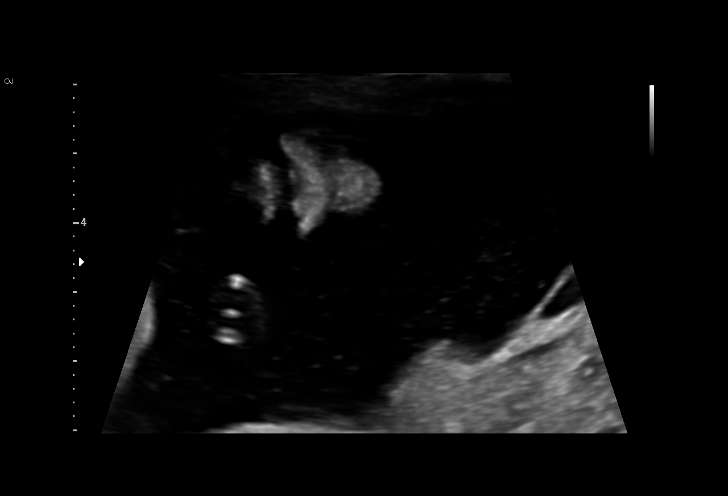
[im 23/69]
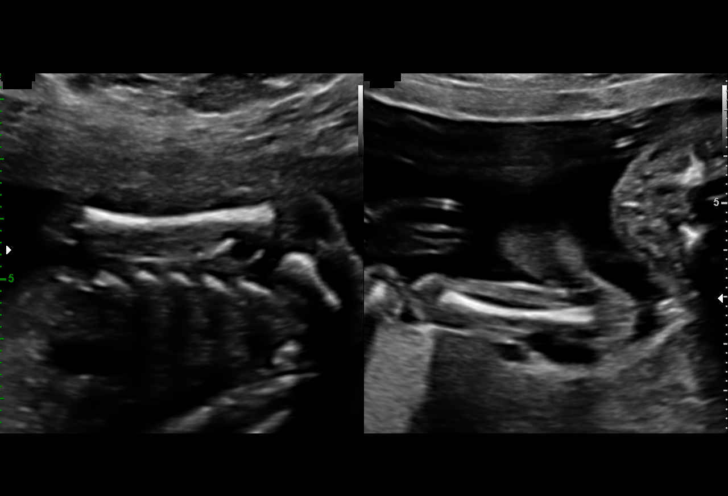
[im 28/69]
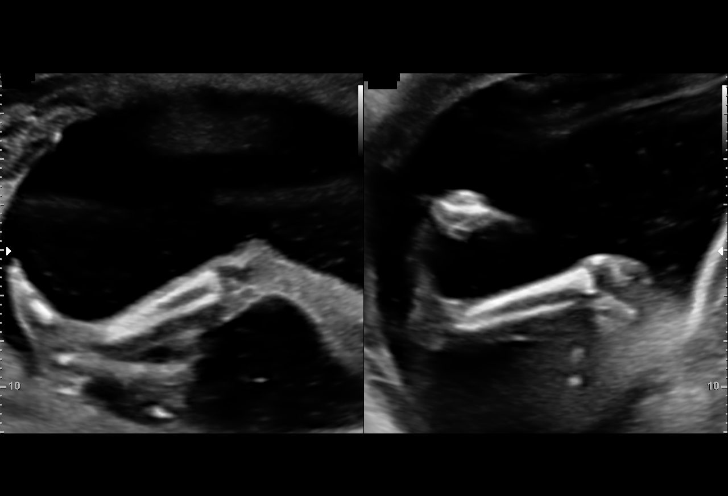
[im 33/69]
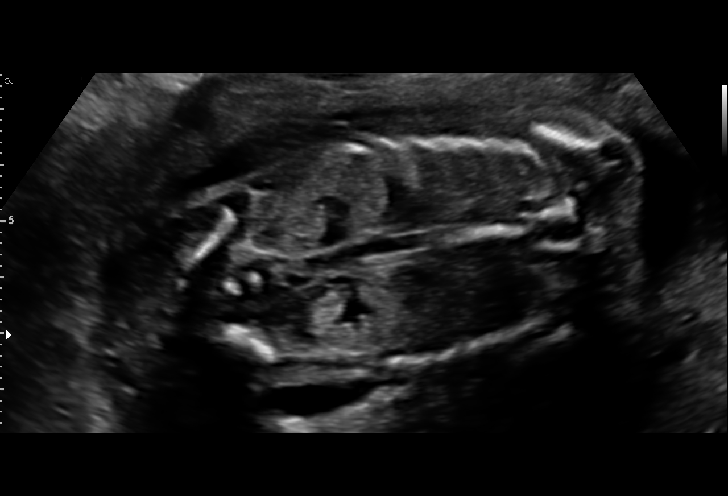
[im 38/69]
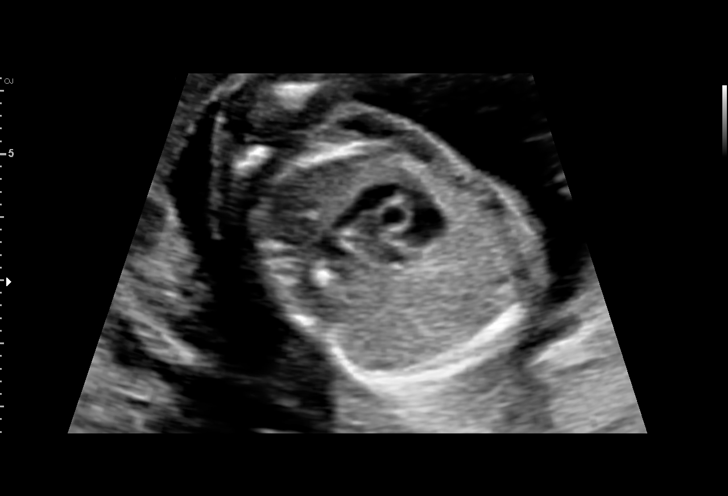
[im 43/69]
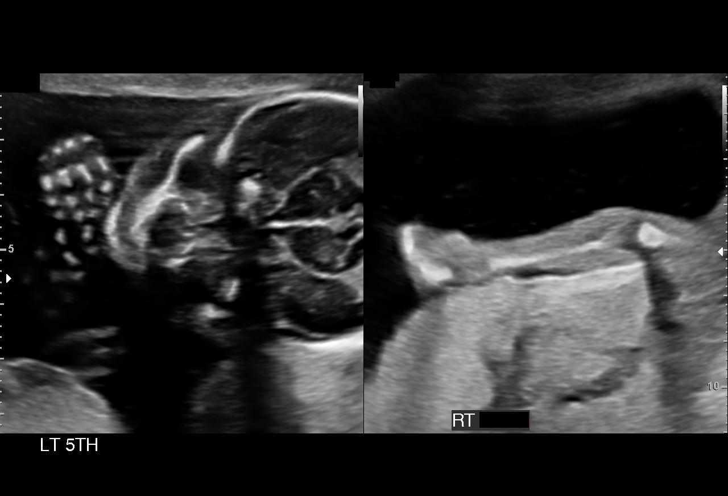
[im 48/69]
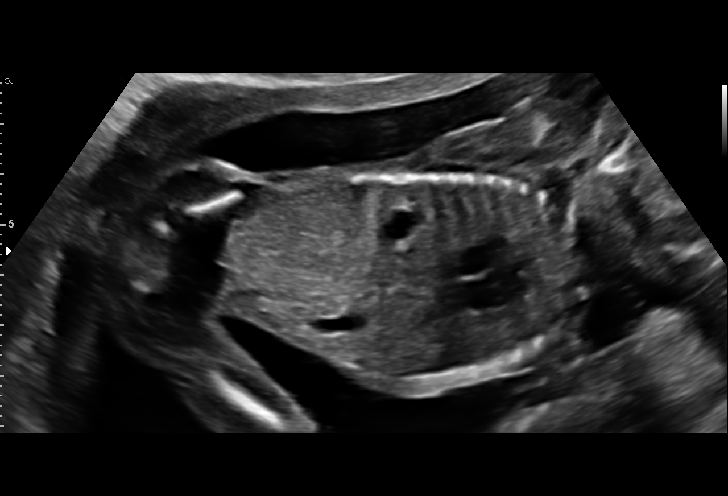
[im 53/69]
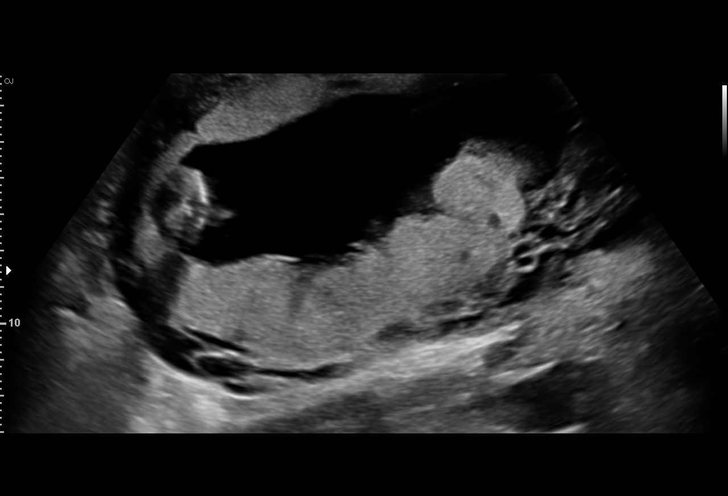
[im 58/69]
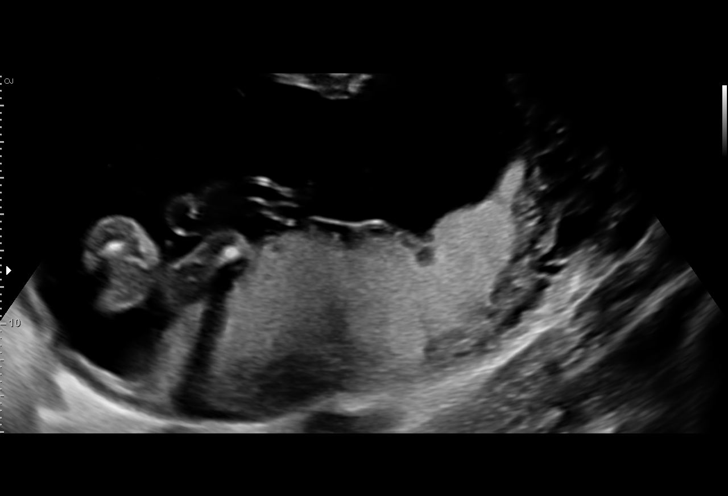
[im 63/69]
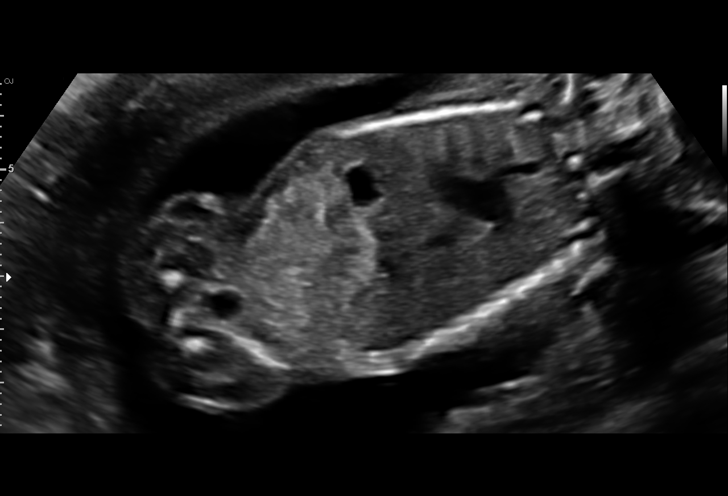
[im 69/69]
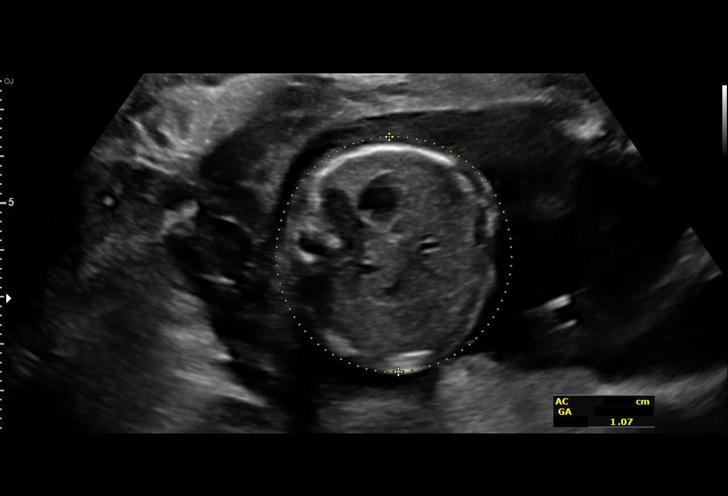

[14 of 28 positions shown; findings below may reference images not displayed]

OB/Gyn Clinic

Indications

19 weeks gestation of pregnancy
Encounter for antenatal screening for
malformations
Obesity complicating pregnancy, second
trimester(pre-pregnancy BMI 31)
OB History

Blood Type:            Height:  5'3"   Weight (lb):  190      BMI:
Gravidity:    3         Term:   1         SAB:   1
Living:       1
Fetal Evaluation

Num Of Fetuses:     1
Fetal Heart         151
Rate(bpm):
Cardiac Activity:   Observed
Presentation:       Cephalic
Placenta:           Posterior, above cervical os
P. Cord Insertion:  Visualized

Amniotic Fluid
AFI FV:      Subjectively within normal limits

Largest Pocket(cm)
4.5
Biometry

BPD:      45.7  mm     G. Age:  19w 6d         82  %    CI:        76.61   %   70 - 86
FL/HC:      20.1   %   16.1 -
HC:      165.4  mm     G. Age:  19w 2d         55  %    HC/AC:      1.07       1.09 -
AC:      154.2  mm     G. Age:  20w 4d         90  %    FL/BPD:     72.9   %
FL:       33.3  mm     G. Age:  20w 3d         88  %    FL/AC:      21.6   %   20 - 24
HUM:      32.1  mm     G. Age:  20w 5d       > 95  %
CER:      20.6  mm     G. Age:  19w 4d         64  %
CM:        5.8  mm

Est. FW:     350  gm    0 lb 12 oz      65  %
Gestational Age

LMP:           19w 0d       Date:   07/12/16                 EDD:   04/18/17
U/S Today:     20w 0d                                        EDD:   04/11/17
Best:          19w 0d    Det. By:   LMP  (07/12/16)          EDD:   04/18/17
Anatomy

Cranium:               Appears normal         Aortic Arch:            Appears normal
Cavum:                 Appears normal         Ductal Arch:            Appears normal
Ventricles:            Appears normal         Diaphragm:              Appears normal
Choroid Plexus:        Appears normal         Stomach:                Appears normal, left
sided
Cerebellum:            Appears normal         Abdomen:                Appears normal
Posterior Fossa:       Appears normal         Abdominal Wall:         Appears nml (cord
insert, abd wall)
Nuchal Fold:           Appears normal         Cord Vessels:           Appears normal (3
vessel cord)
Face:                  Appears normal         Kidneys:                Appear normal
(orbits and profile)
Lips:                  Appears normal         Bladder:                Appears normal
Thoracic:              Appears normal         Spine:                  Appears normal
Heart:                 Appears normal         Upper Extremities:      Appears normal
(4CH, axis, and situs
RVOT:                  Appears normal         Lower Extremities:      Appears normal
LVOT:                  Appears normal

Other:  Male gender. Heels and 5th digit visualized.
Cervix Uterus Adnexa

Cervix
Length:           3.39  cm.
Normal appearance by transabdominal scan.

Uterus
No abnormality visualized.

Left Ovary
Not visualized. No adnexal mass visualized.

Right Ovary
Not visualized. No adnexal mass visualized.
Impression

Single IUP at 19w 0d
Normal fetal anatomic survey
Ultrasound measurements are consistent with LMP
Posterior placenta without previa
Normal amniotic fluid volume
Recommendations

Follow-up ultrasounds as clinically indicated.

## 2020-01-13 ENCOUNTER — Other Ambulatory Visit: Payer: Self-pay

## 2020-01-13 ENCOUNTER — Encounter (HOSPITAL_BASED_OUTPATIENT_CLINIC_OR_DEPARTMENT_OTHER): Payer: Self-pay

## 2020-01-13 ENCOUNTER — Emergency Department (HOSPITAL_BASED_OUTPATIENT_CLINIC_OR_DEPARTMENT_OTHER)
Admission: EM | Admit: 2020-01-13 | Discharge: 2020-01-13 | Disposition: A | Discharge status: Home / Self Care | Payer: BC Managed Care – PPO | Attending: Emergency Medicine | Admitting: Emergency Medicine

## 2020-01-13 DIAGNOSIS — R110 Nausea: Secondary | ICD-10-CM | POA: Diagnosis not present

## 2020-01-13 DIAGNOSIS — I10 Essential (primary) hypertension: Secondary | ICD-10-CM | POA: Diagnosis not present

## 2020-01-13 DIAGNOSIS — E11649 Type 2 diabetes mellitus with hypoglycemia without coma: Secondary | ICD-10-CM | POA: Insufficient documentation

## 2020-01-13 DIAGNOSIS — R519 Headache, unspecified: Secondary | ICD-10-CM | POA: Diagnosis not present

## 2020-01-13 DIAGNOSIS — R030 Elevated blood-pressure reading, without diagnosis of hypertension: Secondary | ICD-10-CM

## 2020-01-13 LAB — CBG MONITORING, ED: Glucose-Capillary: 79 mg/dL (ref 70–99)

## 2020-01-13 MED ORDER — ONDANSETRON 4 MG PO TBDP: 4.0000 mg | ORAL_TABLET | Freq: Three times a day (TID) | ORAL | 0 refills | Status: DC | PRN
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
## 2020-01-13 NOTE — ED Triage Notes (Signed)
Pt arrives with nose bleeds over the past few months with headaches. Pt states she took her BP at home with readings in the 200s, denies being on any medication for hypertension. Bleeding controlled at this time.

## 2020-01-13 NOTE — ED Notes (Signed)
Last nose bleed 2 days agto  Has been going on for 6 months she states has not seen any dr

## 2020-01-13 NOTE — Discharge Instructions (Signed)
Please continue to follow your exercise and lose weight as you are doing.  Please drink plenty of water.  You may take Tylenol as needed for your headaches.  I recommend following up with a primary care doctor in the next 2 to 3weeks since you are having some symptoms that should be followed up on and I would like for your blood pressure to be rechecked.  Your blood pressure was reassuringly only mildly elevated today.  I recommend to continue with the lifestyle modifications that you have been making.  You may use Zofran as needed for nausea.  You may return to ED for any new or concerning symptoms such as this as we discussed.  Or any other symptoms that you are concerned about.

## 2020-11-23 ENCOUNTER — Other Ambulatory Visit: Payer: Self-pay

## 2020-11-23 ENCOUNTER — Emergency Department (HOSPITAL_COMMUNITY)
Admission: EM | Admit: 2020-11-23 | Discharge: 2020-11-23 | Disposition: A | Discharge status: Home / Self Care | Payer: BC Managed Care – PPO | Attending: Emergency Medicine | Admitting: Emergency Medicine

## 2020-11-23 ENCOUNTER — Emergency Department (HOSPITAL_COMMUNITY): Payer: BC Managed Care – PPO

## 2020-11-23 ENCOUNTER — Encounter (HOSPITAL_COMMUNITY): Payer: Self-pay

## 2020-11-23 DIAGNOSIS — I10 Essential (primary) hypertension: Secondary | ICD-10-CM | POA: Insufficient documentation

## 2020-11-23 DIAGNOSIS — Z79899 Other long term (current) drug therapy: Secondary | ICD-10-CM | POA: Insufficient documentation

## 2020-11-23 DIAGNOSIS — N9489 Other specified conditions associated with female genital organs and menstrual cycle: Secondary | ICD-10-CM | POA: Insufficient documentation

## 2020-11-23 DIAGNOSIS — R079 Chest pain, unspecified: Secondary | ICD-10-CM

## 2020-11-23 DIAGNOSIS — E119 Type 2 diabetes mellitus without complications: Secondary | ICD-10-CM | POA: Diagnosis not present

## 2020-11-23 DIAGNOSIS — R0789 Other chest pain: Secondary | ICD-10-CM | POA: Diagnosis present

## 2020-11-23 DIAGNOSIS — R0602 Shortness of breath: Secondary | ICD-10-CM

## 2020-11-23 DIAGNOSIS — Z20822 Contact with and (suspected) exposure to covid-19: Secondary | ICD-10-CM | POA: Insufficient documentation

## 2020-11-23 LAB — COMPREHENSIVE METABOLIC PANEL
ALT: 18 U/L (ref 0–44)
AST: 17 U/L (ref 15–41)
Albumin: 4.3 g/dL (ref 3.5–5.0)
Alkaline Phosphatase: 73 U/L (ref 38–126)
Anion gap: 9 (ref 5–15)
BUN: 10 mg/dL (ref 6–20)
CO2: 24 mmol/L (ref 22–32)
Calcium: 9.2 mg/dL (ref 8.9–10.3)
Chloride: 106 mmol/L (ref 98–111)
Creatinine, Ser: 0.82 mg/dL (ref 0.44–1.00)
GFR, Estimated: >60 mL/min (ref >60)
Glucose, Bld: 95 mg/dL (ref 70–99)
Potassium: 3.5 mmol/L (ref 3.5–5.1)
Sodium: 139 mmol/L (ref 135–145)
Total Bilirubin: 0.7 mg/dL (ref 0.3–1.2)
Total Protein: 7.7 g/dL (ref 6.5–8.1)

## 2020-11-23 LAB — CBC WITH DIFFERENTIAL/PLATELET
Abs Immature Granulocytes: 0.03 10*3/uL (ref 0.00–0.07)
Basophils Absolute: 0 10*3/uL (ref 0.0–0.1)
Basophils Relative: 0 %
Eosinophils Absolute: 0.1 10*3/uL (ref 0.0–0.5)
Eosinophils Relative: 1 %
HCT: 41.6 % (ref 36.0–46.0)
Hemoglobin: 14.3 g/dL (ref 12.0–15.0)
Immature Granulocytes: 0 %
Lymphocytes Relative: 17 %
Lymphs Abs: 2 10*3/uL (ref 0.7–4.0)
MCH: 27.9 pg (ref 26.0–34.0)
MCHC: 34.4 g/dL (ref 30.0–36.0)
MCV: 81.3 fL (ref 80.0–100.0)
Monocytes Absolute: 0.6 10*3/uL (ref 0.1–1.0)
Monocytes Relative: 5 %
Neutro Abs: 8.7 10*3/uL — ABNORMAL HIGH (ref 1.7–7.7)
Neutrophils Relative %: 77 %
Platelets: 318 10*3/uL (ref 150–400)
RBC: 5.12 MIL/uL — ABNORMAL HIGH (ref 3.87–5.11)
RDW: 13.2 % (ref 11.5–15.5)
WBC: 11.4 10*3/uL — ABNORMAL HIGH (ref 4.0–10.5)
nRBC: 0 % (ref 0.0–0.2)

## 2020-11-23 LAB — URINALYSIS, ROUTINE W REFLEX MICROSCOPIC
Bilirubin Urine: NEGATIVE
Glucose, UA: NEGATIVE mg/dL
Hgb urine dipstick: NEGATIVE
Ketones, ur: NEGATIVE mg/dL
Nitrite: NEGATIVE
Protein, ur: NEGATIVE mg/dL
Specific Gravity, Urine: 1.012 (ref 1.005–1.030)
pH: 6 (ref 5.0–8.0)

## 2020-11-23 LAB — TROPONIN I (HIGH SENSITIVITY): Troponin I (High Sensitivity): <2 ng/L (ref <18)

## 2020-11-23 LAB — RESP PANEL BY RT-PCR (FLU A&B, COVID) ARPGX2
Influenza A by PCR: NEGATIVE
Influenza B by PCR: NEGATIVE
SARS Coronavirus 2 by RT PCR: NEGATIVE

## 2020-11-23 LAB — I-STAT BETA HCG BLOOD, ED (MC, WL, AP ONLY): I-stat hCG, quantitative: <5 m[IU]/mL (ref <5)

## 2020-11-23 MED ORDER — AMLODIPINE BESYLATE 5 MG PO TABS: 5.0000 mg | ORAL_TABLET | Freq: Every day | ORAL | 0 refills | Status: DC

## 2020-11-23 MED ORDER — IOHEXOL 350 MG/ML SOLN
80.0000 mL | Freq: Once | INTRAVENOUS | Status: AC | PRN
  Administered 2020-11-23: 80 mL via INTRAVENOUS
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
## 2020-11-23 NOTE — Discharge Instructions (Addendum)
Your workup was very reassuring in the ED today. Please follow up with a primary care provider for follow up. We have started you on blood pressure medication at this time. It is recommend that you keep a daily log of your blood pressure readings to take with you to a primary care provider.   Return to the ED IMMEDIATELY for any new/worsening symptoms

## 2020-11-23 NOTE — ED Notes (Signed)
Patient transported to CT 

## 2020-11-23 NOTE — ED Provider Notes (Signed)
Emergency Medicine Provider Triage Evaluation Note  [name] , a 30 y.o. female  was evaluated in triage.  Pt complains of chest pain.  Patient states about 3 days ago she began experiencing intermittent sharp central chest pain.  States it is 10/10 when it occurs and is nonradiating.  Occurs about every 6 hours.  States it occurs more often when she lies flat and improves when sitting up straight.  Reports associated shortness of breath.  Denies any hemoptysis or leg swelling.  Does note a recent flight to [place] 2 weeks ago but no other recent travel.  She is on once daily Mylan birth control pills.  She states she has been taking these for about 2 years.  Patient also complains of irregular menstrual cycles.  She states that she typically has a 5-day menstrual cycle once per month.  2 months ago she had her last regular menstrual cycle and last month only had a small amount of spotting for 1 day.  She states that she took multiple negative home pregnancy test but took an additional test and saw a "faint line" and is concerned that it might of been a positive test.  She states that while exercising today she had an episode of dark vaginal bleeding that is since resolved.  Also reports some lower abdominal cramping when this occurred.  Physical Exam  BP (!) 167/106 (BP Location: Left Arm)   Pulse (!) 106   Temp 99.2 F (37.3 C) (Oral)   Resp 20   Ht 5\' 3"  (1.6 m)   Wt 106.1 kg   SpO2 99%   BMI 41.45 kg/m  Gen:   Awake, no distress   Resp:  Normal effort  MSK:   Moves extremities without difficulty  Other:    Medical Decision Making  Medically screening exam initiated at 7:28 PM.  Appropriate orders placed.  Timmia A Agostini was informed that the remainder of the evaluation will be completed by another provider, this initial triage assessment does not replace that evaluation, and the importance of remaining in the ED until their evaluation is complete.   Leticia Clas,  PA-C 11/23/20 1930    11/25/20, MD 11/23/20 2037

## 2020-11-23 NOTE — ED Triage Notes (Signed)
Pt reports having chest pain for the past 3 days, she states that it is a tight and heavy feeling. She is also having SHOB. PT reports irregular periods after being regular. She states that she has taken pregnancy tests that have come back positive. Pt is on birth control (Mylan pills).

## 2021-08-04 ENCOUNTER — Emergency Department (HOSPITAL_BASED_OUTPATIENT_CLINIC_OR_DEPARTMENT_OTHER): Payer: BC Managed Care – PPO | Admitting: Radiology

## 2021-08-04 ENCOUNTER — Other Ambulatory Visit: Payer: Self-pay

## 2021-08-04 ENCOUNTER — Emergency Department (HOSPITAL_BASED_OUTPATIENT_CLINIC_OR_DEPARTMENT_OTHER)
Admission: EM | Admit: 2021-08-04 | Discharge: 2021-08-05 | Disposition: A | Discharge status: Home / Self Care | Payer: BC Managed Care – PPO | Attending: Emergency Medicine | Admitting: Emergency Medicine

## 2021-08-04 ENCOUNTER — Encounter (HOSPITAL_BASED_OUTPATIENT_CLINIC_OR_DEPARTMENT_OTHER): Payer: Self-pay

## 2021-08-04 DIAGNOSIS — M79605 Pain in left leg: Secondary | ICD-10-CM | POA: Diagnosis present

## 2021-08-04 DIAGNOSIS — R600 Localized edema: Secondary | ICD-10-CM | POA: Insufficient documentation

## 2021-08-04 DIAGNOSIS — M7989 Other specified soft tissue disorders: Secondary | ICD-10-CM

## 2021-08-04 LAB — CBC
HCT: 42.1 % (ref 36.0–46.0)
Hemoglobin: 14 g/dL (ref 12.0–15.0)
MCH: 27.2 pg (ref 26.0–34.0)
MCHC: 33.3 g/dL (ref 30.0–36.0)
MCV: 81.7 fL (ref 80.0–100.0)
Platelets: 293 10*3/uL (ref 150–400)
RBC: 5.15 MIL/uL — ABNORMAL HIGH (ref 3.87–5.11)
RDW: 13.6 % (ref 11.5–15.5)
WBC: 10 10*3/uL (ref 4.0–10.5)
nRBC: 0 % (ref 0.0–0.2)

## 2021-08-04 LAB — D-DIMER, QUANTITATIVE: D-Dimer, Quant: 0.89 ug/mL-FEU — ABNORMAL HIGH (ref 0.00–0.50)

## 2021-08-04 NOTE — ED Triage Notes (Addendum)
Pt presents POV from [facility] where she was seen due to left calf pain that started this morning. ? ?Pt says [facility] advised her it could be a blood clot and sent her to ED. ? ?Also have mid sternal chest pain that started today. ? ?Denies shortness of breath, dizziness, N/V/D, fevers. ? ?Pain with walking, and pt observed limping to triage. ? ?Both legs warm to touch, no redness noted to left calf, 2+ DP pulses. ? ?NAD in triage, GCS 15. ?

## 2021-08-05 ENCOUNTER — Emergency Department (HOSPITAL_BASED_OUTPATIENT_CLINIC_OR_DEPARTMENT_OTHER)
Admission: RE | Admit: 2021-08-05 | Discharge: 2021-08-05 | Disposition: A | Discharge status: Home / Self Care | Payer: BC Managed Care – PPO | Source: Ambulatory Visit | Attending: Emergency Medicine | Admitting: Emergency Medicine

## 2021-08-05 DIAGNOSIS — M79605 Pain in left leg: Secondary | ICD-10-CM | POA: Diagnosis not present

## 2021-08-05 LAB — BASIC METABOLIC PANEL
Anion gap: 10 (ref 5–15)
BUN: 10 mg/dL (ref 6–20)
CO2: 23 mmol/L (ref 22–32)
Calcium: 9.7 mg/dL (ref 8.9–10.3)
Chloride: 106 mmol/L (ref 98–111)
Creatinine, Ser: 0.89 mg/dL (ref 0.44–1.00)
GFR, Estimated: >60 mL/min (ref >60)
Glucose, Bld: 81 mg/dL (ref 70–99)
Potassium: 3.9 mmol/L (ref 3.5–5.1)
Sodium: 139 mmol/L (ref 135–145)

## 2021-08-05 LAB — TROPONIN I (HIGH SENSITIVITY): Troponin I (High Sensitivity): 2 ng/L (ref <18)

## 2021-08-05 NOTE — Discharge Instructions (Signed)
Please follow the instructions of getting ultrasound completed on Wednesday during daytime. ? ?Take ibuprofen for pain control. ?

## 2021-08-05 NOTE — ED Provider Notes (Signed)
?[facility] EMERGENCY DEPT ?Provider Note ? ? ?CSN: [id number] ?Arrival date & time: 08/04/21  1901 ? ?  ? ?History ? ?Chief Complaint  ?Patient presents with  ? Leg Pain  ? ? ?[name] is a 31 y.o. female. ? ?HPI ? ?  ? ?31 year old female comes in with chief complaint of left calf pain.  [facility], she was advised to come to the ER for DVT evaluation.  Patient indicates that she has been having pain in her left calf since yesterday.  Pain is worse with any activity.  She is on birth control.  She denies any history of PE, DVT.  According to our system, patient had a CT PE last year which was negative. ? ?Home Medications ?Prior to Admission medications   ?Medication Sig Start Date End Date Taking? Authorizing Provider  ?amLODipine (NORVASC) 5 MG tablet Take 1 tablet (5 mg total) by mouth daily. 11/23/20 12/23/20  Tanda Rockers, PA-C  ?ondansetron (ZOFRAN ODT) 4 MG disintegrating tablet Take 1 tablet (4 mg total) by mouth every 8 (eight) hours as needed for nausea or vomiting. 01/13/20   Gailen Shelter, PA  ?   ? ?Allergies    ?Nsaids   ? ?Review of Systems   ?Review of Systems  ?All other systems reviewed and are negative. ? ?Physical Exam ?Updated Vital Signs ?BP (!) 123/96   Pulse 74   Temp 98.4 ?F (36.9 ?C) (Oral)   Resp 18   Ht 5\' 3"  (1.6 m)   Wt 102.1 kg   LMP 08/03/2021 (Approximate)   SpO2 100%   BMI 39.86 kg/m?  ?Physical Exam ?Vitals and nursing note reviewed.  ?Constitutional:   ?   Appearance: She is well-developed.  ?HENT:  ?   Head: Atraumatic.  ?Cardiovascular:  ?   Rate and Rhythm: Normal rate.  ?Pulmonary:  ?   Effort: Pulmonary effort is normal.  ?Musculoskeletal:     ?   General: Tenderness present.  ?   Cervical back: Normal range of motion and neck supple.  ?   Left lower leg: Edema present.  ?Skin: ?   General: Skin is warm and dry.  ?   Findings: No erythema.  ?Neurological:  ?   Mental Status: She is alert and oriented to person, place, and  time.  ? ? ?ED Results / Procedures / Treatments   ?Labs ?(all labs ordered are listed, but only abnormal results are displayed) ?Labs Reviewed  ?CBC - Abnormal; Notable for the following components:  ?    Result Value  ? RBC 5.15 (*)   ? All other components within normal limits  ?D-DIMER, QUANTITATIVE - Abnormal; Notable for the following components:  ? D-Dimer, Quant 0.89 (*)   ? All other components within normal limits  ?BASIC METABOLIC PANEL  ?PREGNANCY, URINE  ?TROPONIN I (HIGH SENSITIVITY)  ?TROPONIN I (HIGH SENSITIVITY)  ? ? ?EKG ?EKG Interpretation ? ?Date/Time:  Tuesday August 04 2021 19:16:03 EDT ?Ventricular Rate:  84 ?PR Interval:  136 ?QRS Duration: 70 ?QT Interval:  380 ?QTC Calculation: 449 ?R Axis:   81 ?Text Interpretation: Normal sinus rhythm Normal ECG When compared with ECG of 23-Nov-2020 20:50, No significant change was found No acute changes No significant change since last tracing Confirmed by 25-Nov-2020 [phone]) on 08/04/2021 11:20:17 PM ? ?Radiology ?DG Chest 2 View ? ?Result Date: 08/04/2021 ?CLINICAL DATA:  Chest pain. EXAM: CHEST - 2 VIEW COMPARISON:  Chest two views 11/23/2020  FINDINGS: Cardiac silhouette and mediastinal contours are within normal limits. The lungs are clear. No pleural effusion or pneumothorax. No acute skeletal abnormality. Incidental note of nipple piercings. IMPRESSION: No active cardiopulmonary disease. Electronically Signed   By: Neita Garnet M.D.   On: 08/04/2021 19:44   ? ?Procedures ?Procedures  ? ? ?Medications Ordered in ED ?Medications - No data to display ? ?ED Course/ Medical Decision Making/ A&P ?  ?                        ?Medical Decision Making ?Amount and/or Complexity of Data Reviewed ?Labs: ordered. ?Radiology: ordered. ? ? ?31 year old female comes in with chief complaint of left calf pain.  On exam she has Tenderness with mild edema.  She does not have any evidence of pitting, erythema, warmth returns.  She is on oral contraceptives. ? ?She  does not have any chest pain, shortness of breath.  No pleurisy.  No dizziness, lightheadedness or near syncope.  CT PE last year was negative.  Low suspicion for PE at this time.  She has elevated D-dimer, but I think a CT PE will be more harmful than helpful in this situation.  Will order outpatient ultrasound DVT.  Discussed my overall feelings about CT scan with the patient, patient is actually comfortable and preferring not getting a necessary CT scan.  Return precautions for PE discussed.  She will get outpatient ultrasound DVT tomorrow, if negative she will take NSAIDs. ? ?Final Clinical Impression(s) / ED Diagnoses ?Final diagnoses:  ?Left leg swelling  ? ? ?Rx / DC Orders ?ED Discharge Orders   ? ?      Ordered  ?  VAS [place] LOWER EXTREMITY VENOUS (DVT)       ? 08/05/21 0036  ? ?  ?  ? ?  ? ? ?  ?Derwood Kaplan, MD ?08/05/21 0040 ? ?

## 2023-01-15 ENCOUNTER — Inpatient Hospital Stay (HOSPITAL_COMMUNITY)
Admission: AD | Admit: 2023-01-15 | Discharge: 2023-01-15 | Disposition: A | Discharge status: Home / Self Care | Payer: BC Managed Care – PPO | Attending: Obstetrics and Gynecology | Admitting: Obstetrics and Gynecology

## 2023-01-15 ENCOUNTER — Inpatient Hospital Stay (HOSPITAL_COMMUNITY): Payer: BC Managed Care – PPO

## 2023-01-15 ENCOUNTER — Encounter (HOSPITAL_COMMUNITY): Payer: Self-pay | Admitting: Obstetrics and Gynecology

## 2023-01-15 DIAGNOSIS — O99011 Anemia complicating pregnancy, first trimester: Secondary | ICD-10-CM | POA: Insufficient documentation

## 2023-01-15 DIAGNOSIS — O10912 Unspecified pre-existing hypertension complicating pregnancy, second trimester: Secondary | ICD-10-CM | POA: Insufficient documentation

## 2023-01-15 DIAGNOSIS — R1030 Lower abdominal pain, unspecified: Secondary | ICD-10-CM | POA: Insufficient documentation

## 2023-01-15 DIAGNOSIS — O26891 Other specified pregnancy related conditions, first trimester: Secondary | ICD-10-CM | POA: Insufficient documentation

## 2023-01-15 DIAGNOSIS — Z3A01 Less than 8 weeks gestation of pregnancy: Secondary | ICD-10-CM | POA: Diagnosis present

## 2023-01-15 DIAGNOSIS — O219 Vomiting of pregnancy, unspecified: Secondary | ICD-10-CM

## 2023-01-15 DIAGNOSIS — I10 Essential (primary) hypertension: Secondary | ICD-10-CM

## 2023-01-15 DIAGNOSIS — Z3202 Encounter for pregnancy test, result negative: Secondary | ICD-10-CM | POA: Diagnosis present

## 2023-01-15 LAB — CBC
HCT: 41 % (ref 36.0–46.0)
Hemoglobin: 14 g/dL (ref 12.0–15.0)
MCH: 27.2 pg (ref 26.0–34.0)
MCHC: 34.1 g/dL (ref 30.0–36.0)
MCV: 79.8 fL — ABNORMAL LOW (ref 80.0–100.0)
Platelets: 309 10*3/uL (ref 150–400)
RBC: 5.14 MIL/uL — ABNORMAL HIGH (ref 3.87–5.11)
RDW: 13.6 % (ref 11.5–15.5)
WBC: 8.3 10*3/uL (ref 4.0–10.5)
nRBC: 0 % (ref 0.0–0.2)

## 2023-01-15 LAB — URINALYSIS, ROUTINE W REFLEX MICROSCOPIC
Bilirubin Urine: NEGATIVE
Glucose, UA: NEGATIVE mg/dL
Hgb urine dipstick: NEGATIVE
Ketones, ur: NEGATIVE mg/dL
Nitrite: NEGATIVE
Protein, ur: NEGATIVE mg/dL
Specific Gravity, Urine: 1.012 (ref 1.005–1.030)
pH: 7 (ref 5.0–8.0)

## 2023-01-15 LAB — POCT PREGNANCY, URINE: Preg Test, Ur: POSITIVE — AB

## 2023-01-15 LAB — HCG, QUANTITATIVE, PREGNANCY: hCG, Beta Chain, Quant, S: 1498 m[IU]/mL — ABNORMAL HIGH (ref <5)

## 2023-01-15 LAB — ABO/RH: ABO/RH(D): O POS

## 2023-01-15 MED ORDER — DOXYLAMINE-PYRIDOXINE 10-10 MG PO TBEC: 1.0000 | DELAYED_RELEASE_TABLET | Freq: Every day | ORAL | 3 refills | Status: AC

## 2023-01-15 MED ORDER — ONDANSETRON 4 MG PO TBDP
8.0000 mg | ORAL_TABLET | Freq: Once | ORAL | Status: AC
  Administered 2023-01-15: 8 mg via ORAL
  Filled 2023-01-15: qty 2

## 2023-01-15 MED ORDER — ONDANSETRON 4 MG PO TBDP: 4.0000 mg | ORAL_TABLET | Freq: Three times a day (TID) | ORAL | 0 refills | Status: DC | PRN

## 2023-01-15 MED ORDER — NIFEDIPINE ER OSMOTIC RELEASE 30 MG PO TB24: 30.0000 mg | ORAL_TABLET | Freq: Every day | ORAL | 6 refills | Status: DC

## 2023-01-15 NOTE — MAU Provider Note (Signed)
Chief Complaint:  Possible Pregnancy and Nausea  HPI   Event Date/Time   First Provider Initiated Contact with Patient 01/15/23 1306     [name] is a 32 y.o. [id number] at [redacted]w[redacted]d who presents to maternity admissions reporting lower abdominal cramping and feeling nauseated, has had positive and negative pregnancy tests. Feels nauseous now, no vomiting. No other physical complaints, no vaginal bleeding.  Pregnancy Course: Has not established OB care yet, planning to see [facility].  Past Medical History:  Diagnosis Date   Anemia    Constipation    Diabetes mellitus without complication (HCC)    Gastric ulcer    Gestational diabetes    History of dysmenorrhea    Hypertension    Hypoglycemia    Ovarian cyst 2011   Pregnancy    Pregnancy induced hypertension    previous pregnancy 2014   Sickle cell trait (HCC)    OB History  Gravida Para Term Preterm AB Living  5 2 2  0 2 2  SAB IAB Ectopic Multiple Live Births  2 0 0 0 2    # Outcome Date GA Lbr Len/2nd Weight Sex Type Anes PTL Lv  5 Current           4 Term 04/10/17    [name] LIV  3 Term 05/28/12 [redacted]w[redacted]d 36:15 / 01:15 7 lb 8 oz (3.402 kg) M Vag-Spont EPI  LIV  2 SAB           1 SAB             Obstetric Comments  2018 induced for HTN, reports HTN with first also, though wasn't induced   Past Surgical History:  Procedure Laterality Date   EYE SURGERY     2004   LAPAROSCOPIC OVARIAN CYSTECTOMY  04/26/2009   LAPAROTOMY N/A 06/16/2012   Procedure: EXPLORATORY LAPAROTOMY;  Surgeon: Emelia Loron, MD;  Location: [facility] OR;  Service: General;  Laterality: N/A;  Repair of duodenal ulcer   REPAIR OF PERFORATED ULCER N/A 06/16/2012   Procedure: REPAIR OF PERFORATED ULCER;  Surgeon: Emelia Loron, MD;  Location: [facility] OR;  Service: General;  Laterality: N/A;  duodenal   Family History  Problem Relation Age of Onset   Asthma Mother    Stroke Mother    Hypertension Father    Breast cancer  Paternal Grandmother    Cancer Paternal Grandmother    Diabetes Paternal Grandmother    Hypertension Paternal Grandmother    Brain cancer Other    Anesthesia problems Neg Hx    Hearing loss Neg Hx    Social History   Tobacco Use   Smoking status: Never   Smokeless tobacco: Never  Vaping Use   Vaping status: Never Used  Substance Use Topics   Alcohol use: Not Currently    Comment: occ wine   Drug use: No   Allergies  Allergen Reactions   Nsaids     Diagnosed with a perforated duodenal ulcer due to NSAIDS   Medications Prior to Admission  Medication Sig Dispense Refill Last Dose   amLODipine (NORVASC) 5 MG tablet Take 1 tablet (5 mg total) by mouth daily. 30 tablet 0    ondansetron (ZOFRAN ODT) 4 MG disintegrating tablet Take 1 tablet (4 mg total) by mouth every 8 (eight) hours as needed for nausea or vomiting. 20 tablet 0    I have reviewed patient's Past Medical Hx, Surgical Hx, Family Hx, Social Hx, medications and allergies.  ROS  Pertinent items noted in HPI and remainder of comprehensive ROS otherwise negative.   PHYSICAL EXAM  Patient Vitals for the past 24 hrs:  BP Temp Temp src Pulse Resp SpO2 Height Weight  01/15/23 1057 (!) 151/93 -- -- 90 -- -- -- --  01/15/23 1038 (!) 150/91 99.2 F (37.3 C) Oral 97 18 97 % 5\' 3"  (1.6 m) 245 lb 12.8 oz (111.5 kg)   Constitutional: Well-developed, well-nourished female in no acute distress.  Cardiovascular: normal rate & rhythm, warm and well-perfused Respiratory: normal effort, no problems with respiration noted GI: Abd soft, non-tender, non-distended MS: Extremities nontender, no edema, normal ROM Neurologic: Alert and oriented x 4.  GU: no CVA tenderness Pelvic: exam deferred, sent to U/S   Labs: Results for orders placed or performed during the hospital encounter of 01/15/23 (from the past 24 hour(s))  Pregnancy, urine POC     Status: Abnormal   Collection Time: 01/15/23 10:40 AM  Result Value Ref Range   Preg  Test, Ur POSITIVE (A) NEGATIVE  Urinalysis, Routine w reflex microscopic -Urine, Clean Catch     Status: Abnormal   Collection Time: 01/15/23 10:42 AM  Result Value Ref Range   Color, Urine YELLOW YELLOW   APPearance HAZY (A) CLEAR   Specific Gravity, Urine 1.012 1.005 - 1.030   pH 7.0 5.0 - 8.0   Glucose, UA NEGATIVE NEGATIVE mg/dL   Hgb urine dipstick NEGATIVE NEGATIVE   Bilirubin Urine NEGATIVE NEGATIVE   Ketones, ur NEGATIVE NEGATIVE mg/dL   Protein, ur NEGATIVE NEGATIVE mg/dL   Nitrite NEGATIVE NEGATIVE   Leukocytes,Ua TRACE (A) NEGATIVE   RBC / HPF 0-5 0 - 5 RBC/hpf   WBC, UA 0-5 0 - 5 WBC/hpf   Bacteria, UA RARE (A) NONE SEEN   Squamous Epithelial / HPF 6-10 0 - 5 /HPF  CBC     Status: Abnormal   Collection Time: 01/15/23 11:22 AM  Result Value Ref Range   WBC 8.3 4.0 - 10.5 K/uL   RBC 5.14 (H) 3.87 - 5.11 MIL/uL   Hemoglobin 14.0 12.0 - 15.0 g/dL   HCT 57.8 46.9 - 62.9 %   MCV 79.8 (L) 80.0 - 100.0 fL   MCH 27.2 26.0 - 34.0 pg   MCHC 34.1 30.0 - 36.0 g/dL   RDW 52.8 41.3 - 24.4 %   Platelets 309 150 - 400 K/uL   nRBC 0.0 0.0 - 0.2 %  ABO/Rh     Status: None   Collection Time: 01/15/23 11:22 AM  Result Value Ref Range   ABO/RH(D)      O POS Performed at [facility], [address], [place], [place] [address]   hCG, quantitative, pregnancy     Status: Abnormal   Collection Time: 01/15/23 11:22 AM  Result Value Ref Range   hCG, Beta Chain, Quant, S 1,498 (H) <5 mIU/mL   Imaging:  US OB LESS THAN 14 WEEKS WITH OB TRANSVAGINAL  Result Date: 01/15/2023 CLINICAL DATA:  Patient to be pregnant. Unsure of dates. Nausea. Quantitative beta HCG pending. Negative urine pregnancy test 01/04/2023. EXAM: OBSTETRIC <14 WK [place] AND TRANSVAGINAL OB US TECHNIQUE: Both transabdominal and transvaginal ultrasound examinations were performed for complete evaluation of the gestation as well as the maternal uterus, adnexal regions, and pelvic cul-de-sac. Transvaginal technique was  performed to assess early pregnancy. COMPARISON:  None Available. FINDINGS: Intrauterine gestational sac: Possible single intrauterine gestational sac. Yolk sac:  Not visualized. Embryo:  Not visualized. Cardiac  Activity: Not visualized. Heart Rate: Not visualized. MSD: 2.6 mm   5 w   1 d CRL: Not visualized. Subchorionic hemorrhage:  None visualized. Maternal uterus/adnexae: Ovaries are unremarkable. No other abnormalities within the adnexal regions. No free pelvic fluid. IMPRESSION: Possible small intrauterine gestational sac with mean sac diameter 2.6 mm compatible estimated gestational age [redacted] weeks 1 day. No yolk sac, fetal pole, or cardiac activity visualized. This may represent a normal early pregnancy, however spontaneous abortion or ectopic pregnancy are possible. Recommend correlation with serum quantitative beta HCG and follow-up ultrasound in 14 days as well as serial quantitative beta HCG follow-up. Electronically Signed   By: Elberta Fortis M.D.   On: 01/15/2023 11:58    MDM & MAU COURSE  MDM: Moderate  MAU Course: Orders Placed This Encounter  Procedures   US OB LESS THAN 14 WEEKS WITH OB TRANSVAGINAL   Urinalysis, Routine w reflex microscopic -Urine, Clean Catch   CBC   hCG, quantitative, pregnancy   Pregnancy, urine POC   ABO/Rh   Discharge patient   Meds ordered this encounter  Medications   ondansetron (ZOFRAN-ODT) disintegrating tablet 8 mg   ondansetron (ZOFRAN-ODT) 4 MG disintegrating tablet    Sig: Take 1 tablet (4 mg total) by mouth every 8 (eight) hours as needed for nausea or vomiting.    Dispense:  30 tablet    Refill:  0   Doxylamine-Pyridoxine (DICLEGIS) 10-10 MG TBEC    Sig: Take 1-2 tablets by mouth at bedtime.    Dispense:  60 tablet    Refill:  3   NIFEdipine (PROCARDIA-XL/NIFEDICAL-XL) 30 MG 24 hr tablet    Sig: Take 1 tablet (30 mg total) by mouth daily.    Dispense:  30 tablet    Refill:  6   Zofran given for nausea and ectopic workup ordered from  triage. Nausea completely relieved by zofran. Results shared with patient and discussed management of nausea and hypertension. Strongly encouraged adherence to anti-hypertensive which she agreed to. Suggested nightly diclegis with zofran PRN with precautions for constipation. Will follow up at [facility] to check for appropriate rise in HCG, ectopic precautions given.  ASSESSMENT   1. [redacted] weeks gestation of pregnancy   2. Nausea and vomiting during pregnancy   3. Chronic hypertension    PLAN  Discharge home in stable condition with above listed precautions.     Follow-up Information     [facility] Follow up in 2 day(s).   Specialty: Obstetrics and Gynecology Why: at 11am for repeat pregnancy blood level Contact information: [address] [place] [address] [phone]        Lyn Henri, MD Follow up.   Specialty: Obstetrics and Gynecology Why: as scheduled for ongoing prenatal care Contact information: [address] [place] [address] [phone]                 Allergies as of 01/15/2023       Reactions   Nsaids    Diagnosed with a perforated duodenal ulcer due to NSAIDS        Medication List     STOP taking these medications    amLODipine 5 MG tablet Commonly known as: NORVASC       TAKE these medications    Doxylamine-Pyridoxine 10-10 MG Tbec Commonly known as: Diclegis Take 1-2 tablets by mouth at bedtime.   NIFEdipine 30 MG 24 hr tablet Commonly known as: PROCARDIA-XL/NIFEDICAL-XL  Take 1 tablet (30 mg total) by mouth daily.   ondansetron 4 MG disintegrating tablet Commonly known as: ZOFRAN-ODT Take 1 tablet (4 mg total) by mouth every 8 (eight) hours as needed for nausea or vomiting.       Edd Arbour, CNM, MSN, IBCLC Certified Nurse Midwife, [facility]

## 2023-01-15 NOTE — Discharge Instructions (Signed)

## 2023-01-15 NOTE — MAU Note (Addendum)
[name] is a 32 y.o. at Unknown here in MAU reporting: has been nauseated. Has been going on for 3 wks.  Not sleeping. Thinks she might be pregnant.  Says thinks as she has had both positive and negative tests in the last 6 months.  Neg noted at [facility] in chart on 9/11. Having cramping like cycle is going to start.no bleeding or d/c. LMP: unknown Onset of complaint: 3wks Pain score: moderate Vitals:   01/15/23 1038  BP: (!) 150/91  Pulse: 97  Resp: 18  Temp: 99.2 F (37.3 C)  SpO2: 97%      Lab orders placed from triage:  UPT/UA if +   Has appt with [facility] on 10/[facility]

## 2023-01-17 ENCOUNTER — Ambulatory Visit: Payer: Self-pay

## 2023-01-17 VITALS — BP 127/88 | HR 96 | Ht 63.0 in | Wt 244.0 lb

## 2023-01-17 DIAGNOSIS — O3680X0 Pregnancy with inconclusive fetal viability, not applicable or unspecified: Secondary | ICD-10-CM

## 2023-01-17 DIAGNOSIS — Z3A01 Less than 8 weeks gestation of pregnancy: Secondary | ICD-10-CM

## 2023-01-17 LAB — BETA HCG QUANT (REF LAB): hCG Quant: 2391 m[IU]/mL

## 2023-01-17 NOTE — Progress Notes (Signed)
Beta HCG Follow-up Visit  [name] presents to [facility] for follow-up beta HCG lab. She was seen in MAU for abdominal pain on 9/21. Patient reports  continued cramping  today similar to MAU visit.Marland Kitchen Discussed with patient that we are following beta HCG levels today. Results will be back in approximately 2 hours. Valid contact number for patient confirmed. I will call the patient with results.   Beta HCG results:          9/21        1,498           9/23        2,391      Results and patient history reviewed with Dr Alvester Morin, who states bhcg rose appropriately but should continue to follow up levels on Wednesday and Friday of this week. Patient called and informed of plan for follow-up. Scheduled stat bhcg for 9/25 & 9/27- discussed if everything continues to progress well, she can follow up with her OB on 10/11.  [name] 01/17/2023 11:04 AM

## 2023-01-19 ENCOUNTER — Telehealth: Payer: Self-pay

## 2023-01-19 ENCOUNTER — Ambulatory Visit (INDEPENDENT_AMBULATORY_CARE_PROVIDER_SITE_OTHER): Payer: BC Managed Care – PPO

## 2023-01-19 VITALS — BP 130/86 | HR 91 | Ht 63.0 in | Wt 244.0 lb

## 2023-01-19 DIAGNOSIS — Z3A01 Less than 8 weeks gestation of pregnancy: Secondary | ICD-10-CM

## 2023-01-19 DIAGNOSIS — O3680X0 Pregnancy with inconclusive fetal viability, not applicable or unspecified: Secondary | ICD-10-CM

## 2023-01-19 DIAGNOSIS — Z0289 Encounter for other administrative examinations: Secondary | ICD-10-CM

## 2023-01-19 LAB — BETA HCG QUANT (REF LAB): hCG Quant: 4299 m[IU]/mL

## 2023-01-19 NOTE — Telephone Encounter (Signed)
-----   Message from Jaynie Collins sent at 01/19/2023  1:01 PM EDT ----- Appropriate HCG rise.  Needs follow up viability scan around 10-14 days from now. Please call to inform patient of results and recommendations.   Jaynie Collins, MD

## 2023-01-19 NOTE — Telephone Encounter (Signed)
Called patient at number listed in chart--[phone]--verified with full name and DOB.   Informed patient of results and recommendations, and offered to schedule viability [place] appointment with her over the phone.   Patient notified RN that she will be continuing care--including viability us--at [facility] and has both appointment and [place] with them scheduled for 02/04/23.   Maureen Ralphs RN on 01/19/23 at [phone]

## 2023-01-19 NOTE — Progress Notes (Signed)
Pt presents for Stat BHCG.  She reports still having some nausea. She denies having abdominal pain or vaginal bleeding. Pt was advised to return to MAU if these symptoms develop. She will be called with results later today. Pt voiced understanding.   1520  BHCG result (4,299) reviewed by Dr. Alvester Morin as she had initiated plan of care on 9/23 following BHCG of 2,391. Due to appropriate rise in BHCG today, she stated that pt does not require additional stat BHCG on 9/27. She may initiate prenatal care @ [facility] as planned. I called pt and informed her of test results as well as recommended plan of care. Appointment in this office on 9/27 was canceled. She voiced understanding and will contact [facility].

## 2023-01-21 ENCOUNTER — Ambulatory Visit: Payer: BC Managed Care – PPO

## 2023-01-27 DIAGNOSIS — Z0289 Encounter for other administrative examinations: Secondary | ICD-10-CM

## 2023-02-06 ENCOUNTER — Other Ambulatory Visit: Payer: Self-pay

## 2023-02-06 ENCOUNTER — Inpatient Hospital Stay (HOSPITAL_COMMUNITY): Payer: BC Managed Care – PPO

## 2023-02-06 ENCOUNTER — Encounter (HOSPITAL_COMMUNITY): Payer: Self-pay | Admitting: Obstetrics

## 2023-02-06 ENCOUNTER — Inpatient Hospital Stay (HOSPITAL_COMMUNITY)
Admission: AD | Admit: 2023-02-06 | Discharge: 2023-02-06 | Disposition: A | Discharge status: Home / Self Care | Payer: BC Managed Care – PPO | Attending: Obstetrics | Admitting: Obstetrics

## 2023-02-06 DIAGNOSIS — N281 Cyst of kidney, acquired: Secondary | ICD-10-CM | POA: Diagnosis not present

## 2023-02-06 DIAGNOSIS — O26831 Pregnancy related renal disease, first trimester: Secondary | ICD-10-CM | POA: Insufficient documentation

## 2023-02-06 DIAGNOSIS — R1031 Right lower quadrant pain: Secondary | ICD-10-CM | POA: Diagnosis not present

## 2023-02-06 DIAGNOSIS — O23591 Infection of other part of genital tract in pregnancy, first trimester: Secondary | ICD-10-CM | POA: Diagnosis not present

## 2023-02-06 DIAGNOSIS — R110 Nausea: Secondary | ICD-10-CM | POA: Diagnosis not present

## 2023-02-06 DIAGNOSIS — N760 Acute vaginitis: Secondary | ICD-10-CM

## 2023-02-06 DIAGNOSIS — Z3A08 8 weeks gestation of pregnancy: Secondary | ICD-10-CM | POA: Diagnosis not present

## 2023-02-06 DIAGNOSIS — O26891 Other specified pregnancy related conditions, first trimester: Secondary | ICD-10-CM | POA: Diagnosis present

## 2023-02-06 DIAGNOSIS — K36 Other appendicitis: Secondary | ICD-10-CM

## 2023-02-06 DIAGNOSIS — B9689 Other specified bacterial agents as the cause of diseases classified elsewhere: Secondary | ICD-10-CM | POA: Insufficient documentation

## 2023-02-06 LAB — URINALYSIS, MICROSCOPIC (REFLEX): Bacteria, UA: NONE SEEN

## 2023-02-06 LAB — CBC
HCT: 36.1 % (ref 36.0–46.0)
Hemoglobin: 12.6 g/dL (ref 12.0–15.0)
MCH: 27.3 pg (ref 26.0–34.0)
MCHC: 34.9 g/dL (ref 30.0–36.0)
MCV: 78.1 fL — ABNORMAL LOW (ref 80.0–100.0)
Platelets: 267 10*3/uL (ref 150–400)
RBC: 4.62 MIL/uL (ref 3.87–5.11)
RDW: 13.4 % (ref 11.5–15.5)
WBC: 9.4 10*3/uL (ref 4.0–10.5)
nRBC: 0 % (ref 0.0–0.2)

## 2023-02-06 LAB — URINALYSIS, ROUTINE W REFLEX MICROSCOPIC
Bilirubin Urine: NEGATIVE
Glucose, UA: NEGATIVE mg/dL
Hgb urine dipstick: NEGATIVE
Ketones, ur: NEGATIVE mg/dL
Nitrite: NEGATIVE
Protein, ur: NEGATIVE mg/dL
Specific Gravity, Urine: 1.015 (ref 1.005–1.030)
pH: 6 (ref 5.0–8.0)

## 2023-02-06 LAB — WET PREP, GENITAL
Sperm: NONE SEEN
Trich, Wet Prep: NONE SEEN
WBC, Wet Prep HPF POC: <10 (ref <10)
Yeast Wet Prep HPF POC: NONE SEEN

## 2023-02-06 LAB — COMPREHENSIVE METABOLIC PANEL
ALT: 14 U/L (ref 0–44)
AST: 12 U/L — ABNORMAL LOW (ref 15–41)
Albumin: 3.2 g/dL — ABNORMAL LOW (ref 3.5–5.0)
Alkaline Phosphatase: 57 U/L (ref 38–126)
Anion gap: 9 (ref 5–15)
BUN: 7 mg/dL (ref 6–20)
CO2: 21 mmol/L — ABNORMAL LOW (ref 22–32)
Calcium: 8.9 mg/dL (ref 8.9–10.3)
Chloride: 106 mmol/L (ref 98–111)
Creatinine, Ser: 0.86 mg/dL (ref 0.44–1.00)
GFR, Estimated: >60 mL/min (ref >60)
Glucose, Bld: 111 mg/dL — ABNORMAL HIGH (ref 70–99)
Potassium: 3.5 mmol/L (ref 3.5–5.1)
Sodium: 136 mmol/L (ref 135–145)
Total Bilirubin: 0.5 mg/dL (ref 0.3–1.2)
Total Protein: 6.3 g/dL — ABNORMAL LOW (ref 6.5–8.1)

## 2023-02-06 LAB — HCG, QUANTITATIVE, PREGNANCY: hCG, Beta Chain, Quant, S: 71180 m[IU]/mL — ABNORMAL HIGH (ref <5)

## 2023-02-06 LAB — ABO/RH: ABO/RH(D): O POS

## 2023-02-06 MED ORDER — METRONIDAZOLE 500 MG PO TABS: 500.0000 mg | ORAL_TABLET | Freq: Two times a day (BID) | ORAL | 0 refills | Status: AC

## 2023-02-06 MED ORDER — ACETAMINOPHEN 500 MG PO TABS
1000.0000 mg | ORAL_TABLET | Freq: Once | ORAL | Status: AC
  Administered 2023-02-06: 1000 mg via ORAL
  Filled 2023-02-06: qty 2

## 2023-02-06 NOTE — MAU Provider Note (Signed)
History     CSN: [id number]  Arrival date and time: 02/06/23 4098   Event Date/Time   First Provider Initiated Contact with Patient 02/06/23 1004      Chief Complaint  Patient presents with   Abdominal Pain   HPI [name] is a 32 y.o. [id number] at [redacted]w[redacted]d who presents today with complaint of right lower quadrant pain. She reports pain started two days ago and has been worsening since. Describes it as a sharp pain, worse w movement and positional changes. Notes she has had more gas recently. She endorses nausea, but otherwise denies fevers, chills, vomiting, constipation, diarrhea, dysuria. Of note, patient presented to MAU on 9/21, had pregnancy of unknown location and had b-hCG trended, which rose appropriately. Had U/S repeated last week (we do not have these records), pt was told that "pregnancy was in the right place and everything looked ok".   Past Medical History:  Diagnosis Date   Anemia    Constipation    Diabetes mellitus without complication (HCC)    Gastric ulcer    Gestational diabetes    History of dysmenorrhea    Hypertension    Hypoglycemia    Ovarian cyst 2011   Pregnancy    Pregnancy induced hypertension    previous pregnancy 2014   Sickle cell trait [facility])     Past Surgical History:  Procedure Laterality Date   EYE SURGERY     2004   LAPAROSCOPIC OVARIAN CYSTECTOMY  04/26/2009   LAPAROTOMY N/A 06/16/2012   Procedure: EXPLORATORY LAPAROTOMY;  Surgeon: Emelia Loron, MD;  Location: [facility] OR;  Service: General;  Laterality: N/A;  Repair of duodenal ulcer   REPAIR OF PERFORATED ULCER N/A 06/16/2012   Procedure: REPAIR OF PERFORATED ULCER;  Surgeon: Emelia Loron, MD;  Location: [facility] OR;  Service: General;  Laterality: N/A;  duodenal    Family History  Problem Relation Age of Onset   Asthma Mother    Stroke Mother    Hypertension Father    Breast cancer Paternal Grandmother    Cancer Paternal Grandmother    Diabetes Paternal Grandmother     Hypertension Paternal Grandmother    Brain cancer Other    Anesthesia problems Neg Hx    Hearing loss Neg Hx     Social History   Tobacco Use   Smoking status: Never   Smokeless tobacco: Never  Vaping Use   Vaping status: Never Used  Substance Use Topics   Alcohol use: Not Currently    Comment: occ wine   Drug use: No    Allergies:  Allergies  Allergen Reactions   Nsaids     Diagnosed with a perforated duodenal ulcer due to NSAIDS    Medications Prior to Admission  Medication Sig Dispense Refill Last Dose   Doxylamine-Pyridoxine (DICLEGIS) 10-10 MG TBEC Take 1-2 tablets by mouth at bedtime. 60 tablet 3 Past Week   NIFEdipine (PROCARDIA-XL/NIFEDICAL-XL) 30 MG 24 hr tablet Take 1 tablet (30 mg total) by mouth daily. 30 tablet 6 02/05/2023   ondansetron (ZOFRAN-ODT) 4 MG disintegrating tablet Take 1 tablet (4 mg total) by mouth every 8 (eight) hours as needed for nausea or vomiting. (Patient not taking: Reported on 01/19/2023) 30 tablet 0    ROS reviewed and pertinent positives and negatives as documented in HPI.  Physical Exam   Blood pressure 131/75, pulse 81, temperature 98.7 F (37.1 C), temperature source Oral, resp. rate 16, height 5\' 3"  (1.6 m), weight 112.4 kg, last menstrual period 12/17/2022,  SpO2 97%, unknown if currently breastfeeding.  Physical Exam Constitutional:      General: She is not in acute distress.    Appearance: Normal appearance. She is not ill-appearing.  HENT:     Head: Normocephalic and atraumatic.  Cardiovascular:     Rate and Rhythm: Normal rate and regular rhythm.     Heart sounds: Normal heart sounds.  Pulmonary:     Effort: Pulmonary effort is normal. No respiratory distress.     Breath sounds: Normal breath sounds.  Abdominal:     General: Bowel sounds are normal. There is no distension.     Palpations: Abdomen is soft.     Tenderness: There is abdominal tenderness in the right lower quadrant. There is rebound. There is no right CVA  tenderness, left CVA tenderness or guarding. Negative signs include obturator sign.  Musculoskeletal:        General: Normal range of motion.  Skin:    General: Skin is warm and dry.     Findings: No rash.  Neurological:     General: No focal deficit present.     Mental Status: She is alert and oriented to person, place, and time.     MAU Course  Procedures  MDM 31 y.o. [id number] at [redacted]w[redacted]d who reports worsening RLQ pain x 2 days, no VB or discharge. She is hemodynamically stable, exam notable for RLQ TTP with rebound tenderness. 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 Will repeat U/S today and obtain labs to eval for pregnancy related etiology of sxs and order MRI abdomen given suspicion for appendicitis.  1449 Results back -- pt is Rh pos, HgB ok, quant rising appropriately, UA was contaminated, but low suspicion for UTI, wet prep c/f BV; U/S with SIUP and normal FHR, but no acute findings. MRI without evidence of appendicitis. Results d/w pt -- likely RLQ pain 2/2 BV vs MSK related. Will tx with course of Flagyl. Pt given return precautions in detail. Stable for d/c.  Assessment and Plan  BV (bacterial vaginosis) - Plan: Discharge patient Rx for Flagyl sent  Right lower quadrant pain - Plan: Discharge patient Labs w/o significant leukocytosis, UA negative No e/o appendicitis or OB related etiology to explain pt's pain Will tx BV and eval for change in sxs Rec increasing hydration Stable for d/c  [name] 02/06/2023, 10:05 AM

## 2023-02-06 NOTE — MAU Note (Signed)
.  [name] is a 32 y.o. at [redacted]w[redacted]d here in MAU reporting: sharp, lower, right abd pain only while laying down.  Pt states position changes didn't help.  Reports pain started Friday night.  Denies vag bleeding.  Has not taken any meds for pain today.   Onset of complaint: friday Pain score: 10/10 Vitals:   02/06/23 0936 02/06/23 0938  BP:  131/75  Pulse:  81  Resp:  16  Temp:  98.7 F (37.1 C)  SpO2: 97%       Lab orders placed from triage:   ua

## 2023-02-06 NOTE — MAU Note (Signed)
MRI tech called RN and said that they had a couple of cases before this patient, but that they would call in about 45 minutes.

## 2023-02-06 NOTE — MAU Note (Signed)
Transport tech at bedside to take patient to radiology.

## 2023-02-07 ENCOUNTER — Telehealth: Payer: Self-pay | Admitting: Family Medicine

## 2023-02-07 LAB — GC/CHLAMYDIA PROBE AMP (~~LOC~~) NOT AT ARMC
Chlamydia: NEGATIVE
Comment: NEGATIVE
Comment: NORMAL
Neisseria Gonorrhea: NEGATIVE

## 2023-02-07 NOTE — Telephone Encounter (Signed)
Tried to reach patient regarding her FMLA papers but no answer, left her a message letting her know that we will not be able to fill her FMLA papers since she have not seen any of our providers in our office.

## 2023-05-04 DIAGNOSIS — Z3482 Encounter for supervision of other normal pregnancy, second trimester: Secondary | ICD-10-CM | POA: Diagnosis not present

## 2023-05-04 DIAGNOSIS — Z3A19 19 weeks gestation of pregnancy: Secondary | ICD-10-CM | POA: Diagnosis not present

## 2023-05-04 DIAGNOSIS — Z363 Encounter for antenatal screening for malformations: Secondary | ICD-10-CM | POA: Diagnosis not present

## 2023-05-25 ENCOUNTER — Inpatient Hospital Stay (HOSPITAL_COMMUNITY)
Admission: AD | Admit: 2023-05-25 | Discharge: 2023-05-25 | Disposition: A | Discharge status: Home / Self Care | Payer: 59 | Attending: Obstetrics and Gynecology | Admitting: Obstetrics and Gynecology

## 2023-05-25 ENCOUNTER — Inpatient Hospital Stay (HOSPITAL_COMMUNITY): Payer: 59

## 2023-05-25 ENCOUNTER — Encounter (HOSPITAL_COMMUNITY): Payer: Self-pay | Admitting: Obstetrics and Gynecology

## 2023-05-25 DIAGNOSIS — O26892 Other specified pregnancy related conditions, second trimester: Secondary | ICD-10-CM | POA: Insufficient documentation

## 2023-05-25 DIAGNOSIS — Z3A23 23 weeks gestation of pregnancy: Secondary | ICD-10-CM | POA: Insufficient documentation

## 2023-05-25 DIAGNOSIS — R079 Chest pain, unspecified: Secondary | ICD-10-CM | POA: Diagnosis not present

## 2023-05-25 DIAGNOSIS — J069 Acute upper respiratory infection, unspecified: Secondary | ICD-10-CM | POA: Insufficient documentation

## 2023-05-25 DIAGNOSIS — O99512 Diseases of the respiratory system complicating pregnancy, second trimester: Secondary | ICD-10-CM | POA: Insufficient documentation

## 2023-05-25 DIAGNOSIS — R030 Elevated blood-pressure reading, without diagnosis of hypertension: Secondary | ICD-10-CM | POA: Diagnosis not present

## 2023-05-25 DIAGNOSIS — R059 Cough, unspecified: Secondary | ICD-10-CM | POA: Diagnosis present

## 2023-05-25 DIAGNOSIS — O162 Unspecified maternal hypertension, second trimester: Secondary | ICD-10-CM

## 2023-05-25 DIAGNOSIS — R0602 Shortness of breath: Secondary | ICD-10-CM | POA: Diagnosis not present

## 2023-05-25 LAB — COMPREHENSIVE METABOLIC PANEL
ALT: 14 U/L (ref 0–44)
AST: 9 U/L — ABNORMAL LOW (ref 15–41)
Albumin: 2.9 g/dL — ABNORMAL LOW (ref 3.5–5.0)
Alkaline Phosphatase: 58 U/L (ref 38–126)
Anion gap: 10 (ref 5–15)
BUN: 6 mg/dL (ref 6–20)
CO2: 21 mmol/L — ABNORMAL LOW (ref 22–32)
Calcium: 9.3 mg/dL (ref 8.9–10.3)
Chloride: 106 mmol/L (ref 98–111)
Creatinine, Ser: 0.69 mg/dL (ref 0.44–1.00)
GFR, Estimated: >60 mL/min (ref >60)
Glucose, Bld: 93 mg/dL (ref 70–99)
Potassium: 3.8 mmol/L (ref 3.5–5.1)
Sodium: 137 mmol/L (ref 135–145)
Total Bilirubin: 0.5 mg/dL (ref 0.0–1.2)
Total Protein: 6.4 g/dL — ABNORMAL LOW (ref 6.5–8.1)

## 2023-05-25 LAB — CBC
HCT: 36.5 % (ref 36.0–46.0)
Hemoglobin: 12.2 g/dL (ref 12.0–15.0)
MCH: 26.1 pg (ref 26.0–34.0)
MCHC: 33.4 g/dL (ref 30.0–36.0)
MCV: 78.2 fL — ABNORMAL LOW (ref 80.0–100.0)
Platelets: 275 10*3/uL (ref 150–400)
RBC: 4.67 MIL/uL (ref 3.87–5.11)
RDW: 14.6 % (ref 11.5–15.5)
WBC: 11.5 10*3/uL — ABNORMAL HIGH (ref 4.0–10.5)
nRBC: 0 % (ref 0.0–0.2)

## 2023-05-25 LAB — RESP PANEL BY RT-PCR (RSV, FLU A&B, COVID)  RVPGX2
Influenza A by PCR: NEGATIVE
Influenza B by PCR: NEGATIVE
Resp Syncytial Virus by PCR: NEGATIVE
SARS Coronavirus 2 by RT PCR: NEGATIVE

## 2023-05-25 LAB — PROTEIN / CREATININE RATIO, URINE
Creatinine, Urine: 134 mg/dL
Protein Creatinine Ratio: 0.07 mg/mg{creat} (ref 0.00–0.15)
Total Protein, Urine: 9 mg/dL

## 2023-05-25 MED ORDER — PANTOPRAZOLE SODIUM 40 MG PO TBEC
40.0000 mg | DELAYED_RELEASE_TABLET | Freq: Once | ORAL | Status: AC
  Administered 2023-05-25: 40 mg via ORAL
  Filled 2023-05-25: qty 1

## 2023-05-25 NOTE — Discharge Instructions (Signed)
For cold and allergy symptoms  You may take Mucinex, (Allegra, Claritin, Zyrtec, Xyzal, Clarinex-any one of these--they are the same class--same as Benadryl), saline or steroid (Nasacort, Nasonex and Flonase) nasal sprays.

## 2023-05-25 NOTE — MAU Note (Signed)
[name] is a 32 y.o. at [redacted]w[redacted]d here in MAU reporting: prod cough started 2 days ago, followed by SOB and chest pain.  Has not had any known exposure.  Denies fever.  No OB complaints, denies vag bleeding or LOF, reports +FM.  Onset of complaint: 2 days ago Pain score: 10 Vitals:   05/25/23 1745 05/25/23 1750  BP:  (!) 147/90  Pulse:  93  Resp:  (!) 26  Temp:  98 F (36.7 C)  SpO2: 98% 97%     FHT:156 Lab orders placed from triage:  resp swab collected
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
## 2023-06-16 DIAGNOSIS — Z01419 Encounter for gynecological examination (general) (routine) without abnormal findings: Secondary | ICD-10-CM | POA: Diagnosis not present

## 2023-06-22 ENCOUNTER — Inpatient Hospital Stay (HOSPITAL_BASED_OUTPATIENT_CLINIC_OR_DEPARTMENT_OTHER): Payer: 59

## 2023-06-22 ENCOUNTER — Encounter (HOSPITAL_COMMUNITY): Payer: Self-pay | Admitting: Obstetrics and Gynecology

## 2023-06-22 ENCOUNTER — Other Ambulatory Visit: Payer: Self-pay

## 2023-06-22 ENCOUNTER — Inpatient Hospital Stay (HOSPITAL_COMMUNITY)
Admission: AD | Admit: 2023-06-22 | Discharge: 2023-06-23 | Disposition: A | Discharge status: Home / Self Care | Payer: 59 | Attending: Obstetrics and Gynecology | Admitting: Obstetrics and Gynecology

## 2023-06-22 DIAGNOSIS — E669 Obesity, unspecified: Secondary | ICD-10-CM | POA: Diagnosis not present

## 2023-06-22 DIAGNOSIS — O99212 Obesity complicating pregnancy, second trimester: Secondary | ICD-10-CM | POA: Insufficient documentation

## 2023-06-22 DIAGNOSIS — W1839XA Other fall on same level, initial encounter: Secondary | ICD-10-CM | POA: Insufficient documentation

## 2023-06-22 DIAGNOSIS — O24419 Gestational diabetes mellitus in pregnancy, unspecified control: Secondary | ICD-10-CM | POA: Diagnosis not present

## 2023-06-22 DIAGNOSIS — O26892 Other specified pregnancy related conditions, second trimester: Secondary | ICD-10-CM | POA: Diagnosis not present

## 2023-06-22 DIAGNOSIS — M545 Low back pain, unspecified: Secondary | ICD-10-CM | POA: Insufficient documentation

## 2023-06-22 DIAGNOSIS — Z3A26 26 weeks gestation of pregnancy: Secondary | ICD-10-CM | POA: Diagnosis not present

## 2023-06-22 DIAGNOSIS — O9A212 Injury, poisoning and certain other consequences of external causes complicating pregnancy, second trimester: Secondary | ICD-10-CM

## 2023-06-22 DIAGNOSIS — W19XXXA Unspecified fall, initial encounter: Secondary | ICD-10-CM | POA: Diagnosis not present

## 2023-06-22 DIAGNOSIS — W1830XA Fall on same level, unspecified, initial encounter: Secondary | ICD-10-CM | POA: Diagnosis not present

## 2023-06-22 DIAGNOSIS — Z3A27 27 weeks gestation of pregnancy: Secondary | ICD-10-CM | POA: Insufficient documentation

## 2023-06-22 LAB — URINALYSIS, ROUTINE W REFLEX MICROSCOPIC
Bacteria, UA: NONE SEEN
Bilirubin Urine: NEGATIVE
Glucose, UA: NEGATIVE mg/dL
Hgb urine dipstick: NEGATIVE
Ketones, ur: NEGATIVE mg/dL
Nitrite: NEGATIVE
Protein, ur: NEGATIVE mg/dL
Specific Gravity, Urine: 1.015 (ref 1.005–1.030)
pH: 5 (ref 5.0–8.0)

## 2023-06-22 MED ORDER — CYCLOBENZAPRINE HCL 5 MG PO TABS
10.0000 mg | ORAL_TABLET | Freq: Once | ORAL | Status: AC
  Administered 2023-06-22: 10 mg via ORAL
  Filled 2023-06-22: qty 2

## 2023-06-22 MED ORDER — OXYCODONE HCL 5 MG PO TABS
10.0000 mg | ORAL_TABLET | Freq: Once | ORAL | Status: AC
  Administered 2023-06-22: 10 mg via ORAL
  Filled 2023-06-22: qty 2

## 2023-06-22 NOTE — MAU Note (Signed)
 Pt returned from ultrasound-ambulating in room-states left sided lower abdominal pain has not improved after flexeril. Pt requesting external fetal and labors monitors not be reapplied.CNM made aware of pt request for additional medication for pain and to discontinue monitoring

## 2023-06-22 NOTE — MAU Note (Signed)
.  [name] is a 33 y.o. at [redacted]w[redacted]d here in MAU reporting: went to use the restroom - lost balance and hit back against the toilet. Having consistent ctx since - every 15-20 minutes. Denies VB or LOF. +FM. Lower back is sore from where she hit it.    Onset of complaint: 1945 Pain score: 10 - ctx; 10 - back Vitals:   06/22/23 2046  BP: (!) 141/82  Pulse: (!) 104  Resp: 20  Temp: 98 F (36.7 C)  SpO2: 100%     FHT: 160  Lab orders placed from triage: UA

## 2023-06-22 NOTE — MAU Provider Note (Cosign Needed)
 Chief Complaint:  Contractions, Fall, and Back Pain   HPI   None     [name] is a 33 y.o. [id number] at [redacted]w[redacted]d who presents to maternity admissions reporting was using the bathroom and fell and hit back. She reports she has been contracting ever since. Did not hit abdomen. She reports he hit her back, just above tailbone. She reports no other injuries. She endorses good and regular fetal movement. No vaginal bleeding. She reports that she took tylenol (2 pills, not sure of dosage) "It was the blue ones." She reports she fell about 1-2 hours ago, approximately 1930-2000. She reports contractions q15-46mins since.   Pregnancy Course: GDM  Past Medical History:  Diagnosis Date   Anemia    Constipation    Diabetes mellitus without complication (HCC)    Gastric ulcer    Gestational diabetes    History of dysmenorrhea    Hypertension    Hypoglycemia    Ovarian cyst 2011   Pregnancy    Sickle cell trait (HCC)    OB History  Gravida Para Term Preterm AB Living  5 2 2  0 2 2  SAB IAB Ectopic Multiple Live Births  2 0 0 0 2    # Outcome Date GA Lbr Len/2nd Weight Sex Type Anes PTL Lv  5 Current           4 Term 04/10/17    [name] LIV  3 Term 05/28/12 [redacted]w[redacted]d 36:15 / 01:15 3402 g M Vag-Spont EPI  LIV  2 SAB           1 SAB             Obstetric Comments  2018 induced for HTN, reports HTN with first also, though wasn't induced   Past Surgical History:  Procedure Laterality Date   EYE SURGERY     2004   LAPAROSCOPIC OVARIAN CYSTECTOMY  04/26/2009   LAPAROTOMY N/A 06/16/2012   Procedure: EXPLORATORY LAPAROTOMY;  Surgeon: Emelia Loron, MD;  Location: [facility] OR;  Service: General;  Laterality: N/A;  Repair of duodenal ulcer   REPAIR OF PERFORATED ULCER N/A 06/16/2012   Procedure: REPAIR OF PERFORATED ULCER;  Surgeon: Emelia Loron, MD;  Location: [facility] OR;  Service: General;  Laterality: N/A;  duodenal   Family History  Problem Relation Age of Onset   Asthma Mother     Stroke Mother    Hypertension Father    Breast cancer Paternal Grandmother    Cancer Paternal Grandmother    Diabetes Paternal Grandmother    Hypertension Paternal Grandmother    Brain cancer Other    Anesthesia problems Neg Hx    Hearing loss Neg Hx    Social History   Tobacco Use   Smoking status: Never   Smokeless tobacco: Never  Vaping Use   Vaping status: Never Used  Substance Use Topics   Alcohol use: Not Currently    Comment: occ wine   Drug use: No   Allergies  Allergen Reactions   Nsaids     Diagnosed with a perforated duodenal ulcer due to NSAIDS   Medications Prior to Admission  Medication Sig Dispense Refill Last Dose/Taking   famotidine (PEPCID) 40 MG tablet Take 40 mg by mouth 2 (two) times daily.   06/22/2023   Doxylamine-Pyridoxine (DICLEGIS) 10-10 MG TBEC Take 1-2 tablets by mouth at bedtime. 60 tablet 3    NIFEdipine (PROCARDIA-XL/NIFEDICAL-XL) 30 MG 24 hr tablet Take 1 tablet (30 mg total) by  mouth daily. 30 tablet 6    ondansetron (ZOFRAN-ODT) 4 MG disintegrating tablet Take 1 tablet (4 mg total) by mouth every 8 (eight) hours as needed for nausea or vomiting. (Patient not taking: Reported on 01/19/2023) 30 tablet 0     I have reviewed patient's Past Medical Hx, Surgical Hx, Family Hx, Social Hx, medications and allergies.   ROS  Pertinent items noted in HPI and remainder of comprehensive ROS otherwise negative.   PHYSICAL EXAM  Patient Vitals for the past 24 hrs:  BP Temp Temp src Pulse Resp SpO2 Height Weight  06/22/23 2109 126/75 -- -- 97 -- -- -- --  06/22/23 2105 -- -- -- -- -- 100 % -- --  06/22/23 2046 (!) 141/82 98 F (36.7 C) Oral (!) 104 20 100 % 5\' 3"  (1.6 m) 118.6 kg    Physical Exam Vitals and nursing note reviewed.  Constitutional:      General: She is not in acute distress.    Appearance: She is not ill-appearing or diaphoretic.  HENT:     Head: Normocephalic and atraumatic.  Cardiovascular:     Rate and Rhythm: Normal rate.      Pulses: Normal pulses.  Pulmonary:     Effort: Pulmonary effort is normal.  Abdominal:     Tenderness: There is abdominal tenderness in the suprapubic area.       Comments: Gravid abdomen.   Musculoskeletal:        General: Tenderness present.     Comments: Difficulty moving, expressing discomfort with movement.   Skin:    General: Skin is warm and dry.     Capillary Refill: Capillary refill takes less than 2 seconds.  Neurological:     General: No focal deficit present.     Mental Status: She is alert and oriented to person, place, and time.  Psychiatric:        Mood and Affect: Mood normal.        Behavior: Behavior normal.        Thought Content: Thought content normal.        Judgment: Judgment normal.         Fetal Tracing: Baseline: 155 Variability: mod Accelerations: 10x10 Decelerations:none Toco: quiet   Labs: No results found for this or any previous visit (from the past 24 hours).  Imaging:  No results found.  MDM & MAU COURSE  MDM: Assessment of placenta due to fall. Unlikely abruption due to no abdominal trauma, reassuring fetal status and lack of contractions. However, concern regarding tenderness of suprapubic region that patient states was not there prior to fall.  Ultrasound to assess placenta with fetal monitoring. Flexeril trial for pain.   MAU Course: Orders Placed This Encounter  Procedures   [place] MFM OB LIMITED   Urinalysis, Routine w reflex microscopic -Urine, Clean Catch   Meds ordered this encounter  Medications   cyclobenzaprine (FLEXERIL) tablet 10 mg    ASSESSMENT   1. [redacted] weeks gestation of pregnancy   2. Fall, initial encounter    - Preliminary ultrasound report reassuring regarding placental integrity.   PLAN  Assessment of placenta due to fall. Unlikely abruption due to no abdominal trauma, reassuring fetal status and lack of contractions. However, concern regarding tenderness of suprapubic region that patient states  was not there prior to fall.  Ultrasound to assess placenta with fetal monitoring. - [place] reassuring Flexeril trial for pain. - Patient reports pain not relieved.  Will trial oxycodone 10mg  for pain.  Consulted Dr. Para March, who recommends OBS overnight d/t patient report of contraction pain in the setting of a fall to ensure fetal well being and rule out abruption. This was relayed to Dr. Rana Snare, patient's OB who opted to recommend a different plan of care. He would like the patient to be monitored for 4 hours post-fall (this will be approximately [id number]) and discharged home with pain medication and follow up in the morning at [facility].   Handoff of care to Dr. Lucianne Muss 06/22/23 @ 2243  Lamont Snowball, MSN, CNM 06/22/2023 10:07 PM  Certified Nurse Midwife, [facility]   1257 Patient re-evaluated at bedside and reports pain significantly better after Roxicodone 10mg . She describes pain as significantly less. Abdomen is soft and nontender on my evaluation. She appeared to be contracting every 5-16 min on toco, but pt not feeling ctx at all. I discussed return precautions including worsening or more frequent ctx, worsening pain, DFM in detail w pt. She understands that she has to be seen in AM at primary [facility] office. Will d/c w Flexeril.  Sundra Aland, MD OB Fellow, Faculty Practice [facility], [facility]

## 2023-06-23 ENCOUNTER — Observation Stay (HOSPITAL_COMMUNITY)
Admission: AD | Admit: 2023-06-23 | Discharge: 2023-06-24 | Disposition: A | Discharge status: Home / Self Care | Payer: 59 | Attending: Obstetrics & Gynecology | Admitting: Obstetrics & Gynecology

## 2023-06-23 ENCOUNTER — Encounter (HOSPITAL_COMMUNITY): Payer: Self-pay | Admitting: Obstetrics & Gynecology

## 2023-06-23 DIAGNOSIS — Z3A27 27 weeks gestation of pregnancy: Secondary | ICD-10-CM | POA: Diagnosis not present

## 2023-06-23 DIAGNOSIS — W01198A Fall on same level from slipping, tripping and stumbling with subsequent striking against other object, initial encounter: Secondary | ICD-10-CM | POA: Insufficient documentation

## 2023-06-23 DIAGNOSIS — M545 Low back pain, unspecified: Secondary | ICD-10-CM | POA: Insufficient documentation

## 2023-06-23 DIAGNOSIS — R109 Unspecified abdominal pain: Principal | ICD-10-CM | POA: Insufficient documentation

## 2023-06-23 DIAGNOSIS — O24419 Gestational diabetes mellitus in pregnancy, unspecified control: Secondary | ICD-10-CM | POA: Insufficient documentation

## 2023-06-23 DIAGNOSIS — Z3A26 26 weeks gestation of pregnancy: Secondary | ICD-10-CM | POA: Diagnosis not present

## 2023-06-23 DIAGNOSIS — O9A212 Injury, poisoning and certain other consequences of external causes complicating pregnancy, second trimester: Secondary | ICD-10-CM | POA: Diagnosis not present

## 2023-06-23 DIAGNOSIS — O26899 Other specified pregnancy related conditions, unspecified trimester: Principal | ICD-10-CM | POA: Diagnosis present

## 2023-06-23 DIAGNOSIS — O26892 Other specified pregnancy related conditions, second trimester: Secondary | ICD-10-CM | POA: Diagnosis not present

## 2023-06-23 DIAGNOSIS — W19XXXA Unspecified fall, initial encounter: Secondary | ICD-10-CM | POA: Diagnosis not present

## 2023-06-23 LAB — CBC
HCT: 32.2 % — ABNORMAL LOW (ref 36.0–46.0)
Hemoglobin: 10.8 g/dL — ABNORMAL LOW (ref 12.0–15.0)
MCH: 25.7 pg — ABNORMAL LOW (ref 26.0–34.0)
MCHC: 33.5 g/dL (ref 30.0–36.0)
MCV: 76.7 fL — ABNORMAL LOW (ref 80.0–100.0)
Platelets: 268 10*3/uL (ref 150–400)
RBC: 4.2 MIL/uL (ref 3.87–5.11)
RDW: 14.9 % (ref 11.5–15.5)
WBC: 11.7 10*3/uL — ABNORMAL HIGH (ref 4.0–10.5)
nRBC: 0 % (ref 0.0–0.2)

## 2023-06-23 LAB — GLUCOSE, CAPILLARY: Glucose-Capillary: 164 mg/dL — ABNORMAL HIGH (ref 70–99)

## 2023-06-23 LAB — TYPE AND SCREEN
ABO/RH(D): O POS
Antibody Screen: NEGATIVE

## 2023-06-23 LAB — FIBRINOGEN: Fibrinogen: 421 mg/dL (ref 210–475)

## 2023-06-23 MED ORDER — LACTATED RINGERS IV SOLN: INTRAVENOUS | Status: DC

## 2023-06-23 MED ORDER — PRENATAL MULTIVITAMIN CH: 1.0000 | ORAL_TABLET | Freq: Every day | ORAL | Status: DC

## 2023-06-23 MED ORDER — LACTATED RINGERS IV SOLN: 125.0000 mL/h | INTRAVENOUS | Status: DC

## 2023-06-23 MED ORDER — OXYCODONE HCL 5 MG PO TABS
5.0000 mg | ORAL_TABLET | Freq: Once | ORAL | Status: AC
  Administered 2023-06-23: 5 mg via ORAL
  Filled 2023-06-23: qty 1

## 2023-06-23 MED ORDER — ZOLPIDEM TARTRATE 5 MG PO TABS
5.0000 mg | ORAL_TABLET | Freq: Every evening | ORAL | Status: DC | PRN
  Administered 2023-06-23: 5 mg via ORAL
  Filled 2023-06-23: qty 1

## 2023-06-23 MED ORDER — FAMOTIDINE 20 MG PO TABS
40.0000 mg | ORAL_TABLET | Freq: Every day | ORAL | Status: DC
  Administered 2023-06-23: 40 mg via ORAL
  Filled 2023-06-23: qty 2

## 2023-06-23 MED ORDER — CALCIUM CARBONATE ANTACID 500 MG PO CHEW: 2.0000 | CHEWABLE_TABLET | ORAL | Status: DC | PRN

## 2023-06-23 MED ORDER — CYCLOBENZAPRINE HCL 10 MG PO TABS
10.0000 mg | ORAL_TABLET | Freq: Three times a day (TID) | ORAL | Status: DC | PRN
  Administered 2023-06-23 (×2): 10 mg via ORAL
  Filled 2023-06-23 (×2): qty 1

## 2023-06-23 MED ORDER — ONDANSETRON HCL 4 MG/2ML IJ SOLN
4.0000 mg | Freq: Four times a day (QID) | INTRAMUSCULAR | Status: DC | PRN
  Administered 2023-06-23: 4 mg via INTRAVENOUS
  Filled 2023-06-23: qty 2

## 2023-06-23 MED ORDER — DOCUSATE SODIUM 100 MG PO CAPS: 100.0000 mg | ORAL_CAPSULE | Freq: Every day | ORAL | Status: DC

## 2023-06-23 MED ORDER — FERROUS SULFATE 325 (65 FE) MG PO TABS: 325.0000 mg | ORAL_TABLET | Freq: Every day | ORAL | Status: DC

## 2023-06-23 MED ORDER — CYCLOBENZAPRINE HCL 5 MG PO TABS: 5.0000 mg | ORAL_TABLET | Freq: Three times a day (TID) | ORAL | 0 refills | Status: AC | PRN

## 2023-06-23 MED ORDER — ACETAMINOPHEN 325 MG PO TABS
650.0000 mg | ORAL_TABLET | ORAL | Status: DC | PRN
  Administered 2023-06-23 (×2): 650 mg via ORAL
  Filled 2023-06-23 (×2): qty 2

## 2023-06-23 NOTE — Progress Notes (Signed)
 Patient's pain is improved s/p oxicodone 5 mg x 1 dose.  She reports mild nausea.  Eating dinner now.  Active FM.  No more CTX.  Active FM.  FHT Cat I Fibrinogen >400  Recommend Ambien to help rest today Likely d/c home in AM if continued stability  Mitchel Honour, DO

## 2023-06-23 NOTE — H&P (Addendum)
 [name] is a 33 y.o. female [id number] at [redacted]w[redacted]d presenting for abdominal pain s/p fall at 1745 last night.  Patient report fall in her bathroom last night after tripped on a rug.  She hit her low back on toilet and starting to feel CTX.  She presented to MAU and was monitored x 4 hours.  She had reassuring u/s which did not show abruption.  No VB.  Felt improved after Oxicodone 10 mg and was discharged home with planned f/u in office today.  On office presentation, patient reported recurrence of pain and CTX in upper abdomen about 30 minutes prior to OV.  She continued to deny VB and reported active FM.  Patient was sent for obs to ensure stability s/p fall.    Antepartum course complicated by A1DM.  Patient only recently good GDM supplies and started monitoring CBGs.  They have been elevated in 170s.  Patient also has CHTN on no medication currently.  Patient has h/o duodenal ulcer with perforation and surgical repair shortly after her first delivery in 2014.  Patient has sickle cell trait; FOB status unknown.    Patient is planning primary elective C/S secondary to complications in both of her newborns; seizures requiring NICU stay x 3 weeks in her first and cephalohematoma s/p vac assist in her second.    OB History     Gravida  5   Para  2   Term  2   Preterm  0   AB  2   Living  2      SAB  2   IAB  0   Ectopic  0   Multiple  0   Live Births  2        Obstetric Comments  2018 induced for HTN, reports HTN with first also, though wasn't induced        Past Medical History:  Diagnosis Date   Anemia    Constipation    Diabetes mellitus without complication (HCC)    Gastric ulcer    Gestational diabetes    History of dysmenorrhea    Hypertension    Hypoglycemia    Ovarian cyst 2011   Pregnancy    Sickle cell trait (HCC)    Past Surgical History:  Procedure Laterality Date   EYE SURGERY     2004   LAPAROSCOPIC OVARIAN CYSTECTOMY  04/26/2009    LAPAROTOMY N/A 06/16/2012   Procedure: EXPLORATORY LAPAROTOMY;  Surgeon: Emelia Loron, MD;  Location: [facility] OR;  Service: General;  Laterality: N/A;  Repair of duodenal ulcer   REPAIR OF PERFORATED ULCER N/A 06/16/2012   Procedure: REPAIR OF PERFORATED ULCER;  Surgeon: Emelia Loron, MD;  Location: [facility] OR;  Service: General;  Laterality: N/A;  duodenal   Family History: family history includes Asthma in her mother; Brain cancer in her paternal grandmother; Breast cancer in her paternal grandmother; Cancer in her paternal grandmother; Diabetes in her father and paternal grandmother; Hypertension in her father and paternal grandmother; Stroke in her mother. Social History:  reports that she has never smoked. She has never used smokeless tobacco. She reports that she does not currently use alcohol. She reports that she does not use drugs.     Maternal Diabetes: Yes:  Diabetes Type:  Diet controlled Genetic Screening: Normal Maternal Ultrasounds/Referrals: Normal Fetal Ultrasounds or other Referrals:  None Maternal Substance Abuse:  No Significant Maternal Medications:  None Significant Maternal Lab Results:  None Number of Prenatal Visits:greater than 3 verified prenatal  visits Maternal Vaccinations: None Other Comments:  None  Review of Systems Maternal Medical History:  Reason for admission: Contractions.   Contractions: Frequency: irregular.   Perceived severity is moderate.   Fetal activity: Perceived fetal activity is normal.   Last perceived fetal movement was within the past hour.   Prenatal complications: PIH.   Prenatal Complications - Diabetes: gestational. Diabetes is managed by diet.       Blood pressure 133/71, pulse (!) 110, temperature 97.9 F (36.6 C), temperature source Oral, resp. rate 18, height 5\' 3"  (1.6 m), weight 116.6 kg, last menstrual period 12/17/2022, SpO2 97%, unknown if currently breastfeeding. Maternal Exam:  Abdomen: Patient reports the  following abdominal tenderness: Epigastric.  Surgical scars: supraumbilical midline.   Fundal height is c/w dates.     Fetal Exam Fetal Monitor Review: Baseline rate: 150.  Variability: moderate (6-25 bpm).   Pattern: accelerations present and no decelerations.   Fetal State Assessment: Category I - tracings are normal.   Physical Exam Constitutional:      Appearance: Normal appearance.  HENT:     Head: Normocephalic.  Pulmonary:     Effort: Pulmonary effort is normal.  Abdominal:     Palpations: Abdomen is soft.     Tenderness: There is abdominal tenderness in the epigastric area.     Comments: Vertical supraumbilical incision (s/p duodenal ulcer perf) is location of pain.  Musculoskeletal:        General: Normal range of motion.     Cervical back: Normal range of motion.  Skin:    General: Skin is warm and dry.  Neurological:     Mental Status: She is alert and oriented to person, place, and time.  Psychiatric:        Mood and Affect: Mood normal.        Behavior: Behavior normal.   Pelvic: no vaginal bleeding noted.  CVX cl/th/hi   Prenatal labs: ABO, Rh: --/--/PENDING (02/27 1301) Antibody: PENDING (02/27 1301) Rubella:   RPR:    HBsAg:    HIV:    GBS:     Assessment/Plan: 32yo [id number] at [redacted]w[redacted]d with abdominal pain s/p fall  -CEFM -Fibrinogen -Flexeril/Tylenol prn pain -GDM-Monitor CBGs -CHTN-stable on no meds -Anemia-FeSO4 -SCDs  Mitchel Honour 06/23/2023, 1:38 PM

## 2023-06-23 NOTE — Inpatient Diabetes Management (Signed)
 Inpatient Diabetes Program Recommendations  Diabetes Treatment Program Recommendations  ADA Standards of Care Diabetes in Pregnancy Target Glucose Ranges:  Fasting: 70 - 95 mg/dL 1 hr postprandial: Less than 140mg /dL (from first bite of meal) 2 hr postprandial: Less than 120 mg/dL (from first bite of meal)       Latest Reference Range & Units 05/25/23 19:58  Glucose 70 - 99 mg/dL 93    Review of Glycemic Control  Diabetes history: GDM hx; [name] Outpatient Diabetes medications: None Current orders for Inpatient glycemic control: None  Inpatient Diabetes Program Recommendations:    Glucose monitoring: Noted hx of GDM in prior pregnancy. May want to consider ordering CBGs fasting and 2 hour post prandial. If post prandial glucose is over 120 mg/dl, may want to consider ordering Novolog 0-14 units TID (2 hours postprandial).   NOTE: Noted consult for diabetes coordinator. Patient admitted under observation. NO glucose or CBGs in chart since arrival and last glucose in chart was 93 mg/dl on 1/61/09.    Thanks, Orlando Penner, RN, MSN, CDCES Diabetes Coordinator Inpatient Diabetes Program [phone] (Team Pager from 8am to 5pm)

## 2023-06-24 LAB — GLUCOSE, CAPILLARY: Glucose-Capillary: 166 mg/dL — ABNORMAL HIGH (ref 70–99)

## 2023-06-24 MED ORDER — GLYBURIDE 5 MG PO TABS: 5.0000 mg | ORAL_TABLET | Freq: Every day | ORAL | 0 refills | Status: AC

## 2023-06-24 MED ORDER — OXYCODONE HCL 5 MG PO TABS: 5.0000 mg | ORAL_TABLET | ORAL | 0 refills | Status: DC | PRN

## 2023-06-24 MED ORDER — CYCLOBENZAPRINE HCL 10 MG PO TABS: 10.0000 mg | ORAL_TABLET | Freq: Three times a day (TID) | ORAL | 0 refills | Status: AC | PRN

## 2023-06-24 NOTE — Discharge Summary (Signed)
 Admission Diagnosis: IUP at 27 weeks Status post fall GDM  Discharge Diagnosis: Same  Hospital Course: 33 year old female admitted after back pain from a fall. On admission, a fibrinogen was normal.  She was admitted overnight for monitoring which was normal. Ultrasound was unremarkable and did not reveal any evidence of placental abruption. No VB was reported.  This morning, patient reports pain in lower back at the location of where she fell. She denies any abdominal pain and reports no VB. She reports normal FM. BP (!) 141/72 (BP Location: Right Arm)   Pulse 96   Temp 98.1 F (36.7 C) (Oral)   Resp 18   Ht 5\' 3"  (1.6 m)   Wt 116.6 kg   LMP 12/17/2022 (Exact Date)   SpO2 100%   BMI 45.53 kg/m  Exam some mild tenderness mid lower back  No uterine tenderness Results for orders placed or performed during the hospital encounter of 06/23/23 (from the past 24 hours)  CBC     Status: Abnormal   Collection Time: 06/23/23 12:59 PM  Result Value Ref Range   WBC 11.7 (H) 4.0 - 10.5 K/uL   RBC 4.20 3.87 - 5.11 MIL/uL   Hemoglobin 10.8 (L) 12.0 - 15.0 g/dL   HCT 81.1 (L) 91.4 - 78.2 %   MCV 76.7 (L) 80.0 - 100.0 fL   MCH 25.7 (L) 26.0 - 34.0 pg   MCHC 33.5 30.0 - 36.0 g/dL   RDW 95.6 21.3 - 08.6 %   Platelets 268 150 - 400 K/uL   nRBC 0.0 0.0 - 0.2 %  Fibrinogen     Status: None   Collection Time: 06/23/23 12:59 PM  Result Value Ref Range   Fibrinogen 421 210 - 475 mg/dL  Type and screen [facility]     Status: None   Collection Time: 06/23/23  1:01 PM  Result Value Ref Range   ABO/RH(D) O POS    Antibody Screen NEG    Sample Expiration      06/26/2023,2359 Performed at [facility], [address], [place], [place] [address]   Glucose, capillary     Status: Abnormal   Collection Time: 06/23/23 11:16 PM  Result Value Ref Range   Glucose-Capillary 164 (H) 70 - 99 mg/dL  Glucose, capillary     Status: Abnormal   Collection Time: 06/24/23  5:08 AM   Result Value Ref Range   Glucose-Capillary 166 (H) 70 - 99 mg/dL   Comment 1 Notify RN    Will discharge home in good condition. My impression is that she has some musculoskeletal strain  Rx flexeril and oxycodone prn  Also her CBGs are a little elevated, compliance with diet has been a problem so we discussed that. Will also start Glyburide tonight and monitor CBGs. She will bring in log next week to review with MD. PTL precautions given to patient.

## 2023-06-30 DIAGNOSIS — Z3A27 27 weeks gestation of pregnancy: Secondary | ICD-10-CM | POA: Diagnosis not present

## 2023-06-30 DIAGNOSIS — Z3483 Encounter for supervision of other normal pregnancy, third trimester: Secondary | ICD-10-CM | POA: Diagnosis not present

## 2023-06-30 DIAGNOSIS — O24419 Gestational diabetes mellitus in pregnancy, unspecified control: Secondary | ICD-10-CM | POA: Diagnosis not present

## 2023-07-09 ENCOUNTER — Encounter (HOSPITAL_COMMUNITY): Payer: Self-pay | Admitting: Obstetrics & Gynecology

## 2023-07-09 ENCOUNTER — Inpatient Hospital Stay (HOSPITAL_COMMUNITY)
Admission: AD | Admit: 2023-07-09 | Discharge: 2023-07-10 | Disposition: A | Discharge status: Home / Self Care | Attending: Obstetrics & Gynecology | Admitting: Obstetrics & Gynecology

## 2023-07-09 DIAGNOSIS — B9689 Other specified bacterial agents as the cause of diseases classified elsewhere: Secondary | ICD-10-CM

## 2023-07-09 DIAGNOSIS — Z3A29 29 weeks gestation of pregnancy: Secondary | ICD-10-CM | POA: Insufficient documentation

## 2023-07-09 DIAGNOSIS — O10913 Unspecified pre-existing hypertension complicating pregnancy, third trimester: Secondary | ICD-10-CM | POA: Insufficient documentation

## 2023-07-09 DIAGNOSIS — O4703 False labor before 37 completed weeks of gestation, third trimester: Secondary | ICD-10-CM | POA: Insufficient documentation

## 2023-07-09 DIAGNOSIS — Z3689 Encounter for other specified antenatal screening: Secondary | ICD-10-CM

## 2023-07-09 LAB — CBC
HCT: 33.7 % — ABNORMAL LOW (ref 36.0–46.0)
Hemoglobin: 11.2 g/dL — ABNORMAL LOW (ref 12.0–15.0)
MCH: 25.1 pg — ABNORMAL LOW (ref 26.0–34.0)
MCHC: 33.2 g/dL (ref 30.0–36.0)
MCV: 75.4 fL — ABNORMAL LOW (ref 80.0–100.0)
Platelets: 250 10*3/uL (ref 150–400)
RBC: 4.47 MIL/uL (ref 3.87–5.11)
RDW: 14.8 % (ref 11.5–15.5)
WBC: 11.5 10*3/uL — ABNORMAL HIGH (ref 4.0–10.5)
nRBC: 0.3 % — ABNORMAL HIGH (ref 0.0–0.2)

## 2023-07-09 LAB — WET PREP, GENITAL
Sperm: NONE SEEN
Trich, Wet Prep: NONE SEEN
WBC, Wet Prep HPF POC: >=10 — AB (ref <10)
Yeast Wet Prep HPF POC: NONE SEEN

## 2023-07-09 MED ORDER — CALCIUM CARBONATE ANTACID 500 MG PO CHEW
400.0000 mg | CHEWABLE_TABLET | Freq: Once | ORAL | Status: DC
  Filled 2023-07-09: qty 2

## 2023-07-09 MED ORDER — LACTATED RINGERS IV BOLUS
1000.0000 mL | Freq: Once | INTRAVENOUS | Status: AC
  Administered 2023-07-09: 1000 mL via INTRAVENOUS

## 2023-07-09 MED ORDER — ACETAMINOPHEN 500 MG PO TABS
1000.0000 mg | ORAL_TABLET | Freq: Once | ORAL | Status: AC
  Administered 2023-07-09: 1000 mg via ORAL
  Filled 2023-07-09: qty 2

## 2023-07-09 MED ORDER — FAMOTIDINE IN NACL 20-0.9 MG/50ML-% IV SOLN
20.0000 mg | Freq: Once | INTRAVENOUS | Status: AC
  Administered 2023-07-09: 20 mg via INTRAVENOUS
  Filled 2023-07-09: qty 50

## 2023-07-09 MED ORDER — CYCLOBENZAPRINE HCL 5 MG PO TABS
10.0000 mg | ORAL_TABLET | Freq: Once | ORAL | Status: AC
  Administered 2023-07-09: 10 mg via ORAL
  Filled 2023-07-09: qty 2

## 2023-07-09 MED ORDER — NIFEDIPINE 10 MG PO CAPS
10.0000 mg | ORAL_CAPSULE | ORAL | Status: DC | PRN
  Administered 2023-07-09 – 2023-07-10 (×3): 10 mg via ORAL
  Filled 2023-07-09 (×4): qty 1

## 2023-07-09 NOTE — MAU Note (Signed)
.  [name] is a 33 y.o. at [redacted]w[redacted]d here in MAU reporting: Ctxs since 2pm this afternoon, pt also reports Vomiting and diarrhea.   No lof no bleeding.  +FM  Pain score: 10/10 pelvic pressure and 10/10 abdominal pain that radiates down to lower abdomen.    FHT: 141  Vitals:   07/09/23 2159  BP: 131/77  Pulse: (!) 144  Resp: 20  Temp: 98.4 F (36.9 C)  SpO2: 100%

## 2023-07-09 NOTE — MAU Provider Note (Signed)
 History     CSN: [id number]  Arrival date and time: 07/09/23 2143   Event Date/Time   First Provider Initiated Contact with Patient 07/09/23 2207      Chief Complaint  Patient presents with  . Abdominal Pain  . Contractions   [name] is a 33 y.o. [id number] at [redacted]w[redacted]d who receives care at [facility].  She presents today for contractions.  She states the contractions started last night around 10pm and she took   She reports contractions became "quicker" around 8pm.     OB History     Gravida  5   Para  2   Term  2   Preterm  0   AB  2   Living  2      SAB  2   IAB  0   Ectopic  0   Multiple  0   Live Births  2        Obstetric Comments  2018 induced for HTN, reports HTN with first also, though wasn't induced         Past Medical History:  Diagnosis Date  . Anemia   . Constipation   . Diabetes mellitus without complication (HCC)   . Gastric ulcer   . Gestational diabetes   . History of dysmenorrhea   . Hypertension   . Hypoglycemia   . Ovarian cyst 2011  . Pregnancy   . Sickle cell trait [facility])     Past Surgical History:  Procedure Laterality Date  . EYE SURGERY     2004  . LAPAROSCOPIC OVARIAN CYSTECTOMY  04/26/2009  . LAPAROTOMY N/A 06/16/2012   Procedure: EXPLORATORY LAPAROTOMY;  Surgeon: Emelia Loron, MD;  Location: [facility] OR;  Service: General;  Laterality: N/A;  Repair of duodenal ulcer  . REPAIR OF PERFORATED ULCER N/A 06/16/2012   Procedure: REPAIR OF PERFORATED ULCER;  Surgeon: Emelia Loron, MD;  Location: [facility] OR;  Service: General;  Laterality: N/A;  duodenal    Family History  Problem Relation Age of Onset  . Asthma Mother   . Stroke Mother   . Diabetes Father   . Hypertension Father   . Brain cancer Paternal Grandmother   . Breast cancer Paternal Grandmother   . Cancer Paternal Grandmother   . Diabetes Paternal Grandmother   . Hypertension Paternal Grandmother   . Anesthesia problems Neg Hx   .  Hearing loss Neg Hx     Social History   Tobacco Use  . Smoking status: Never  . Smokeless tobacco: Never  Vaping Use  . Vaping status: Never Used  Substance Use Topics  . Alcohol use: Not Currently    Comment: occ wine  . Drug use: No    Allergies:  Allergies  Allergen Reactions  . Nsaids     Diagnosed with a perforated duodenal ulcer due to NSAIDS    Medications Prior to Admission  Medication Sig Dispense Refill Last Dose/Taking  . glyBURIDE (DIABETA) 5 MG tablet Take 1 tablet (5 mg total) by mouth daily with breakfast. 30 tablet 0 Past Week  . cyclobenzaprine (FLEXERIL) 10 MG tablet Take 1 tablet (10 mg total) by mouth 3 (three) times daily as needed for muscle spasms. 30 tablet 0   . cyclobenzaprine (FLEXERIL) 5 MG tablet Take 1 tablet (5 mg total) by mouth 3 (three) times daily as needed for muscle spasms. MAY CAUSE DROWSINESS, DO NOT OPERATE HEAVY MACHINERY AFTER TAKING THIS MEDICATION 20 tablet 0   . Doxylamine-Pyridoxine (  DICLEGIS) 10-10 MG TBEC Take 1-2 tablets by mouth at bedtime. 60 tablet 3   . famotidine (PEPCID) 40 MG tablet Take 40 mg by mouth 2 (two) times daily.     Marland Kitchen NIFEdipine (PROCARDIA-XL/NIFEDICAL-XL) 30 MG 24 hr tablet Take 1 tablet (30 mg total) by mouth daily. 30 tablet 6   . ondansetron (ZOFRAN-ODT) 4 MG disintegrating tablet Take 1 tablet (4 mg total) by mouth every 8 (eight) hours as needed for nausea or vomiting. (Patient not taking: Reported on 01/19/2023) 30 tablet 0   . oxyCODONE (ROXICODONE) 5 MG immediate release tablet Take 1 tablet (5 mg total) by mouth every 4 (four) hours as needed for severe pain (pain score 7-10). 30 tablet 0     Review of Systems  Gastrointestinal:  Positive for abdominal pain (Cramping). Negative for constipation, diarrhea, nausea and vomiting.  Genitourinary:  Negative for difficulty urinating, dysuria, vaginal bleeding and vaginal discharge.   Physical Exam   Blood pressure 131/77, pulse (!) 144, temperature 98.4 F  (36.9 C), temperature source Oral, resp. rate 20, height 5\' 3"  (1.6 m), weight 115.7 kg, last menstrual period 12/17/2022, SpO2 100%, unknown if currently breastfeeding.  Physical Exam  Fetal Assessment *** bpm, Mod Var, -Decels, +Accels Toco:   MAU Course   Results for orders placed or performed during the hospital encounter of 07/09/23 (from the past 24 hours)  Wet prep, genital     Status: Abnormal   Collection Time: 07/09/23 10:18 PM   Specimen: Cervix  Result Value Ref Range   Yeast Wet Prep HPF POC NONE SEEN NONE SEEN   Trich, Wet Prep NONE SEEN NONE SEEN   Clue Cells Wet Prep HPF POC PRESENT (A) NONE SEEN   WBC, Wet Prep HPF POC >=10 (A) <10   Sperm NONE SEEN    No results found.  MDM PE Labs: EFM  Assessment and Plan  33 year old [id number]  SIUP at 29.1 weeks Cat I FT Preterm Contractions  -POC Reviewed -Exam performed and findings discussed. -Reassured that cervix is closed.    Cherre Robins MSN, CNM 07/09/2023, 10:08 PM   Reassessment (11:05 PM) -Nurse reports patient continues to report pain 10/10. -Procardia series ordered.  -Patient with elevated blood pressures likely d/t discomfort.  H/o HTN noted in [facility]. -PreE labs ordered.  -Nurse also reports patient c/o heartburn.  Tums ordered.   Reassessment (11:35 PM)   Reassessment (11:35 PM)

## 2023-07-10 DIAGNOSIS — O4703 False labor before 37 completed weeks of gestation, third trimester: Secondary | ICD-10-CM | POA: Diagnosis not present

## 2023-07-10 DIAGNOSIS — Z3A29 29 weeks gestation of pregnancy: Secondary | ICD-10-CM

## 2023-07-10 LAB — COMPREHENSIVE METABOLIC PANEL
ALT: 15 U/L (ref 0–44)
AST: 14 U/L — ABNORMAL LOW (ref 15–41)
Albumin: 2.9 g/dL — ABNORMAL LOW (ref 3.5–5.0)
Alkaline Phosphatase: 64 U/L (ref 38–126)
Anion gap: 14 (ref 5–15)
BUN: 5 mg/dL — ABNORMAL LOW (ref 6–20)
CO2: 18 mmol/L — ABNORMAL LOW (ref 22–32)
Calcium: 9.2 mg/dL (ref 8.9–10.3)
Chloride: 108 mmol/L (ref 98–111)
Creatinine, Ser: 0.75 mg/dL (ref 0.44–1.00)
GFR, Estimated: >60 mL/min (ref >60)
Glucose, Bld: 86 mg/dL (ref 70–99)
Potassium: 3.5 mmol/L (ref 3.5–5.1)
Sodium: 140 mmol/L (ref 135–145)
Total Bilirubin: 0.8 mg/dL (ref 0.0–1.2)
Total Protein: 6.3 g/dL — ABNORMAL LOW (ref 6.5–8.1)

## 2023-07-10 LAB — URINALYSIS, ROUTINE W REFLEX MICROSCOPIC
Bilirubin Urine: NEGATIVE
Glucose, UA: NEGATIVE mg/dL
Hgb urine dipstick: NEGATIVE
Ketones, ur: 80 mg/dL — AB
Nitrite: NEGATIVE
Protein, ur: NEGATIVE mg/dL
Specific Gravity, Urine: 1.011 (ref 1.005–1.030)
pH: 6 (ref 5.0–8.0)

## 2023-07-10 LAB — PROTEIN / CREATININE RATIO, URINE
Creatinine, Urine: 106 mg/dL
Protein Creatinine Ratio: 0.12 mg/mg{creat} (ref 0.00–0.15)
Total Protein, Urine: 13 mg/dL

## 2023-07-10 MED ORDER — METRONIDAZOLE 500 MG PO TABS: 500.0000 mg | ORAL_TABLET | Freq: Two times a day (BID) | ORAL | 0 refills | Status: DC

## 2023-07-10 MED ORDER — MORPHINE SULFATE (PF) 4 MG/ML IV SOLN
4.0000 mg | Freq: Once | INTRAVENOUS | Status: AC
  Administered 2023-07-10: 4 mg via INTRAVENOUS
  Filled 2023-07-10: qty 1

## 2023-07-10 MED ORDER — LACTATED RINGERS IV BOLUS
1000.0000 mL | Freq: Once | INTRAVENOUS | Status: AC
  Administered 2023-07-09: 1000 mL via INTRAVENOUS

## 2023-07-10 MED ORDER — ONDANSETRON HCL 4 MG/2ML IJ SOLN
4.0000 mg | Freq: Once | INTRAMUSCULAR | Status: AC
Start: 2023-07-10 — End: 2023-07-10
  Administered 2023-07-10: 4 mg via INTRAVENOUS
  Filled 2023-07-10: qty 2

## 2023-07-11 LAB — GC/CHLAMYDIA PROBE AMP (~~LOC~~) NOT AT ARMC
Chlamydia: NEGATIVE
Comment: NEGATIVE
Comment: NORMAL
Neisseria Gonorrhea: NEGATIVE

## 2023-07-20 ENCOUNTER — Inpatient Hospital Stay (HOSPITAL_COMMUNITY)
Admission: AD | Admit: 2023-07-20 | Discharge: 2023-07-20 | Disposition: A | Discharge status: Home / Self Care | Attending: Obstetrics and Gynecology | Admitting: Obstetrics and Gynecology

## 2023-07-20 ENCOUNTER — Other Ambulatory Visit: Payer: Self-pay

## 2023-07-20 ENCOUNTER — Encounter (HOSPITAL_COMMUNITY): Payer: Self-pay | Admitting: Obstetrics and Gynecology

## 2023-07-20 DIAGNOSIS — K5900 Constipation, unspecified: Secondary | ICD-10-CM | POA: Diagnosis not present

## 2023-07-20 DIAGNOSIS — O26893 Other specified pregnancy related conditions, third trimester: Secondary | ICD-10-CM | POA: Insufficient documentation

## 2023-07-20 DIAGNOSIS — N858 Other specified noninflammatory disorders of uterus: Secondary | ICD-10-CM | POA: Diagnosis not present

## 2023-07-20 DIAGNOSIS — Z0371 Encounter for suspected problem with amniotic cavity and membrane ruled out: Secondary | ICD-10-CM | POA: Diagnosis present

## 2023-07-20 DIAGNOSIS — Z3A30 30 weeks gestation of pregnancy: Secondary | ICD-10-CM | POA: Insufficient documentation

## 2023-07-20 DIAGNOSIS — N898 Other specified noninflammatory disorders of vagina: Secondary | ICD-10-CM | POA: Diagnosis not present

## 2023-07-20 DIAGNOSIS — O99891 Other specified diseases and conditions complicating pregnancy: Secondary | ICD-10-CM | POA: Insufficient documentation

## 2023-07-20 DIAGNOSIS — O4703 False labor before 37 completed weeks of gestation, third trimester: Secondary | ICD-10-CM | POA: Diagnosis not present

## 2023-07-20 DIAGNOSIS — M549 Dorsalgia, unspecified: Secondary | ICD-10-CM | POA: Diagnosis not present

## 2023-07-20 LAB — URINALYSIS, ROUTINE W REFLEX MICROSCOPIC
Bilirubin Urine: NEGATIVE
Glucose, UA: NEGATIVE mg/dL
Hgb urine dipstick: NEGATIVE
Ketones, ur: NEGATIVE mg/dL
Nitrite: NEGATIVE
Protein, ur: NEGATIVE mg/dL
Specific Gravity, Urine: 1.012 (ref 1.005–1.030)
pH: 6 (ref 5.0–8.0)

## 2023-07-20 LAB — RUPTURE OF MEMBRANE (ROM)PLUS: Rom Plus: NEGATIVE

## 2023-07-20 MED ORDER — POLYETHYLENE GLYCOL 3350 17 G PO PACK
17.0000 g | PACK | Freq: Once | ORAL | Status: AC
Start: 2023-07-20 — End: 2023-07-20
  Administered 2023-07-20: 17 g via ORAL
  Filled 2023-07-20 (×2): qty 1

## 2023-07-20 MED ORDER — POLYETHYLENE GLYCOL 3350 17 G PO PACK: 17.0000 g | PACK | Freq: Every day | ORAL | 0 refills | Status: AC

## 2023-07-20 MED ORDER — CYCLOBENZAPRINE HCL 5 MG PO TABS
10.0000 mg | ORAL_TABLET | Freq: Once | ORAL | Status: AC
  Administered 2023-07-20: 10 mg via ORAL
  Filled 2023-07-20: qty 2

## 2023-07-20 MED ORDER — NIFEDIPINE 10 MG PO CAPS
10.0000 mg | ORAL_CAPSULE | ORAL | Status: DC | PRN
  Administered 2023-07-20 (×2): 10 mg via ORAL
  Filled 2023-07-20 (×2): qty 1

## 2023-07-20 NOTE — MAU Note (Signed)
 OVELLA MANYGOATS is a 33 y.o. at [redacted]w[redacted]d here in MAU reporting: she reports got up to go downstairs in he homeraround 1pm and when she looked back at her bed she saw a puddle of fluid on the bed. She reports she isn't continuing to leak now, however, she says she's very moist in the vaginal area and feels the urge to poop. She reports she's been contracting since about 2pm but isn't sure how far apart they are. Endorses +FM, denies VB.   Onset of complaint: today around 1pm Pain score: ctx 10/10 abdomen, pelvis, back Vitals:   07/20/23 2059  BP: (!) 148/82  Pulse: (!) 113  Resp: 19  Temp: 98.3 F (36.8 C)  SpO2: 99%     FHT: 162 initial external   Lab orders placed from triage: urine collected

## 2023-07-20 NOTE — MAU Provider Note (Signed)
 Chief Complaint:  Contractions and Rupture of Membranes   Event Date/Time   First Provider Initiated Contact with Patient 07/20/23 2108     HPI: [name] is a 33 y.o. [id number] at 35w5dwho presents to maternity admissions reporting leaking fluid. Has had some pelvic and low back cramping.   . She reports good fetal movement, denies vaginal bleeding, vaginal itching/burning, urinary symptoms, n/v, or fever/chills.    Vaginal Discharge The patient's primary symptoms include pelvic pain and vaginal discharge. The patient's pertinent negatives include no vaginal bleeding. This is a new problem. The current episode started today. The problem has been unchanged. She is pregnant. Associated symptoms include abdominal pain and back pain. Pertinent negatives include no constipation, diarrhea, fever or frequency. The vaginal discharge was clear.  Abdominal Pain The current episode started today. The pain is located in the suprapubic region. The quality of the pain is cramping. The abdominal pain radiates to the back. Pertinent negatives include no constipation, diarrhea, fever or frequency.   RN note: Sherry LEAVELLE is a 33 y.o. at [redacted]w[redacted]d here in MAU reporting: she reports got up to go downstairs in he homeraround 1pm and when she looked back at her bed she saw a puddle of fluid on the bed. She reports she isn't continuing to leak now, however, she says she's very moist in the vaginal area and feels the urge to poop. She reports she's been contracting since about 2pm but isn't sure how far apart they are. Endorses +FM, denies VB.   Onset of complaint: today around 1pm  Past Medical History: Past Medical History:  Diagnosis Date   Anemia    Constipation    Diabetes mellitus without complication (HCC)    Gastric ulcer    Gestational diabetes    History of dysmenorrhea    Hypertension    Hypoglycemia    Ovarian cyst 2011   Pregnancy    Sickle cell trait (HCC)     Past obstetric  history: OB History  Gravida Para Term Preterm AB Living  5 2 2  0 2 2  SAB IAB Ectopic Multiple Live Births  2 0 0 0 2    # Outcome Date GA Lbr Len/2nd Weight Sex Type Anes PTL Lv  5 Current           4 Term 04/10/17    [name] LIV  3 Term 05/28/12 [redacted]w[redacted]d 36:15 / 01:15 3402 g M Vag-Spont EPI  LIV  2 SAB           1 SAB             Obstetric Comments  2018 induced for HTN, reports HTN with first also, though wasn't induced    Past Surgical History: Past Surgical History:  Procedure Laterality Date   EYE SURGERY     2004   LAPAROSCOPIC OVARIAN CYSTECTOMY  04/26/2009   LAPAROTOMY N/A 06/16/2012   Procedure: EXPLORATORY LAPAROTOMY;  Surgeon: Emelia Loron, MD;  Location: [facility] OR;  Service: General;  Laterality: N/A;  Repair of duodenal ulcer   REPAIR OF PERFORATED ULCER N/A 06/16/2012   Procedure: REPAIR OF PERFORATED ULCER;  Surgeon: Emelia Loron, MD;  Location: [facility] OR;  Service: General;  Laterality: N/A;  duodenal    Family History: Family History  Problem Relation Age of Onset   Asthma Mother    Stroke Mother    Diabetes Father    Hypertension Father    Brain cancer Paternal Grandmother  Breast cancer Paternal Grandmother    Cancer Paternal Grandmother    Diabetes Paternal Grandmother    Hypertension Paternal Grandmother    Anesthesia problems Neg Hx    Hearing loss Neg Hx     Social History: Social History   Tobacco Use   Smoking status: Never   Smokeless tobacco: Never  Vaping Use   Vaping status: Never Used  Substance Use Topics   Alcohol use: Not Currently    Comment: occ wine   Drug use: No    Allergies:  Allergies  Allergen Reactions   Nsaids     Diagnosed with a perforated duodenal ulcer due to NSAIDS    Meds:  Medications Prior to Admission  Medication Sig Dispense Refill Last Dose/Taking   cyclobenzaprine (FLEXERIL) 10 MG tablet Take 1 tablet (10 mg total) by mouth 3 (three) times daily as needed for muscle spasms. 30 tablet  0 07/20/2023 at 12:00 AM   Doxylamine-Pyridoxine (DICLEGIS) 10-10 MG TBEC Take 1-2 tablets by mouth at bedtime. 60 tablet 3 Past Month   NIFEdipine (PROCARDIA-XL/NIFEDICAL-XL) 30 MG 24 hr tablet Take 1 tablet (30 mg total) by mouth daily. 30 tablet 6 Past Week   cyclobenzaprine (FLEXERIL) 5 MG tablet Take 1 tablet (5 mg total) by mouth 3 (three) times daily as needed for muscle spasms. MAY CAUSE DROWSINESS, DO NOT OPERATE HEAVY MACHINERY AFTER TAKING THIS MEDICATION 20 tablet 0    famotidine (PEPCID) 40 MG tablet Take 40 mg by mouth 2 (two) times daily.      glyBURIDE (DIABETA) 5 MG tablet Take 1 tablet (5 mg total) by mouth daily with breakfast. 30 tablet 0    metroNIDAZOLE (FLAGYL) 500 MG tablet Take 1 tablet (500 mg total) by mouth 2 (two) times daily. 14 tablet 0    oxyCODONE (ROXICODONE) 5 MG immediate release tablet Take 1 tablet (5 mg total) by mouth every 4 (four) hours as needed for severe pain (pain score 7-10). 30 tablet 0     I have reviewed patient's Past Medical Hx, Surgical Hx, Family Hx, Social Hx, medications and allergies.   ROS:  Review of Systems  Constitutional:  Negative for fever.  Gastrointestinal:  Positive for abdominal pain. Negative for constipation and diarrhea.  Genitourinary:  Positive for pelvic pain and vaginal discharge. Negative for frequency.  Musculoskeletal:  Positive for back pain.   Other systems negative  Physical Exam  Patient Vitals for the past 24 hrs:  BP Temp Temp src Pulse Resp SpO2 Height Weight  07/20/23 2146 (!) 143/83 -- -- 98 -- -- -- --  07/20/23 2131 (!) 151/95 -- -- (!) 103 -- -- -- --  07/20/23 2116 (!) 140/80 -- -- (!) 102 -- -- -- --  07/20/23 2059 (!) 148/82 98.3 F (36.8 C) Oral (!) 113 19 99 % -- --  07/20/23 2054 -- -- -- -- -- -- 5\' 3"  (1.6 m) 118.8 kg   Constitutional: Well-developed, well-nourished female in no acute distress.  Cardiovascular: normal rate  Respiratory: normal effort GI: Abd soft, non-tender, gravid  appropriate for gestational age.   No rebound or guarding. MS: Extremities nontender, no edema, normal ROM Neurologic: Alert and oriented x 4.  GU: Neg CVAT.  PELVIC EXAM:  Dilation: Closed Effacement (%): Thick Exam by:: [name], CNM Stool in vault ROM+ sent >> NEGATIVE   FHT:  Baseline 130 , moderate variability, accelerations present, no decelerations Contractions: Uterine irritability   Labs: Results for orders placed or performed during the hospital  encounter of 07/20/23 (from the past 24 hours)  Rupture of Membrane (ROM) Plus     Status: None   Collection Time: 07/20/23  9:21 PM  Result Value Ref Range   Rom Plus NEGATIVE   Urinalysis, Routine w reflex microscopic -Urine, Clean Catch     Status: Abnormal   Collection Time: 07/20/23 10:07 PM  Result Value Ref Range   Color, Urine YELLOW YELLOW   APPearance CLOUDY (A) CLEAR   Specific Gravity, Urine 1.012 1.005 - 1.030   pH 6.0 5.0 - 8.0   Glucose, UA NEGATIVE NEGATIVE mg/dL   Hgb urine dipstick NEGATIVE NEGATIVE   Bilirubin Urine NEGATIVE NEGATIVE   Ketones, ur NEGATIVE NEGATIVE mg/dL   Protein, ur NEGATIVE NEGATIVE mg/dL   Nitrite NEGATIVE NEGATIVE   Leukocytes,Ua LARGE (A) NEGATIVE   RBC / HPF 21-50 0 - 5 RBC/hpf   WBC, UA 21-50 0 - 5 WBC/hpf   Bacteria, UA FEW (A) NONE SEEN   Squamous Epithelial / HPF 21-50 0 - 5 /HPF   Mucus PRESENT    Non Squamous Epithelial 0-5 (A) NONE SEEN    --/--/O POS (02/27 1301)  Imaging:    MAU Course/MDM: I have reviewed the triage vital signs and the nursing notes.   Pertinent labs & imaging results that were available during my care of the patient were reviewed by me and considered in my medical decision making (see chart for details).      I have reviewed her medical records including past results, notes and treatments.   I have ordered labs and reviewed results.  ROM+ negative  NST reviewed  Treatments in MAU included Procardia given for tocolysis of irritability,  relieved contractions Flexeril given for back pain.  Miralax given for constipation..    Assessment: Single IUP at [redacted]w[redacted]d Vaginal discharge, rule out PPROM, negative ROM+ Uterine irritability Constipation  Plan: Discharge home Urine to culture to rule out UTI as possible source of leaking Preterm Labor precautions and fetal kick counts Follow up in Office for prenatal visits and recheck Rx Miralax for constipation Followup in office Encouraged to return if she develops worsening of symptoms, increase in pain, fever, or other concerning symptoms.     Pt stable at time of discharge.  [name] CNM, MSN Certified Nurse-Midwife 07/20/2023 9:58 PM

## 2023-07-21 LAB — CULTURE, OB URINE: Culture: NO GROWTH

## 2023-07-29 DIAGNOSIS — Z3483 Encounter for supervision of other normal pregnancy, third trimester: Secondary | ICD-10-CM | POA: Diagnosis not present

## 2023-07-29 DIAGNOSIS — Z3A32 32 weeks gestation of pregnancy: Secondary | ICD-10-CM | POA: Diagnosis not present

## 2023-07-29 DIAGNOSIS — O169 Unspecified maternal hypertension, unspecified trimester: Secondary | ICD-10-CM | POA: Diagnosis not present

## 2023-08-02 DIAGNOSIS — Z3A32 32 weeks gestation of pregnancy: Secondary | ICD-10-CM | POA: Diagnosis not present

## 2023-08-02 DIAGNOSIS — O24415 Gestational diabetes mellitus in pregnancy, controlled by oral hypoglycemic drugs: Secondary | ICD-10-CM | POA: Diagnosis not present

## 2023-08-02 DIAGNOSIS — Z3483 Encounter for supervision of other normal pregnancy, third trimester: Secondary | ICD-10-CM | POA: Diagnosis not present

## 2023-08-09 DIAGNOSIS — O24415 Gestational diabetes mellitus in pregnancy, controlled by oral hypoglycemic drugs: Secondary | ICD-10-CM | POA: Diagnosis not present

## 2023-08-09 DIAGNOSIS — Z3A33 33 weeks gestation of pregnancy: Secondary | ICD-10-CM | POA: Diagnosis not present

## 2023-08-09 DIAGNOSIS — Z3483 Encounter for supervision of other normal pregnancy, third trimester: Secondary | ICD-10-CM | POA: Diagnosis not present

## 2023-08-15 ENCOUNTER — Inpatient Hospital Stay (HOSPITAL_COMMUNITY)
Admission: AD | Admit: 2023-08-15 | Discharge: 2023-08-15 | Disposition: A | Discharge status: Home / Self Care | Attending: Obstetrics and Gynecology | Admitting: Obstetrics and Gynecology

## 2023-08-15 ENCOUNTER — Encounter (HOSPITAL_COMMUNITY): Payer: Self-pay | Admitting: Obstetrics and Gynecology

## 2023-08-15 DIAGNOSIS — O4703 False labor before 37 completed weeks of gestation, third trimester: Secondary | ICD-10-CM | POA: Insufficient documentation

## 2023-08-15 DIAGNOSIS — Z3A34 34 weeks gestation of pregnancy: Secondary | ICD-10-CM | POA: Insufficient documentation

## 2023-08-15 DIAGNOSIS — O10913 Unspecified pre-existing hypertension complicating pregnancy, third trimester: Secondary | ICD-10-CM | POA: Diagnosis not present

## 2023-08-15 DIAGNOSIS — O10013 Pre-existing essential hypertension complicating pregnancy, third trimester: Secondary | ICD-10-CM | POA: Insufficient documentation

## 2023-08-15 DIAGNOSIS — O10919 Unspecified pre-existing hypertension complicating pregnancy, unspecified trimester: Secondary | ICD-10-CM

## 2023-08-15 LAB — PROTEIN / CREATININE RATIO, URINE
Creatinine, Urine: 186 mg/dL
Protein Creatinine Ratio: 0.12 mg/mg{creat} (ref 0.00–0.15)
Total Protein, Urine: 22 mg/dL

## 2023-08-15 LAB — URINALYSIS, ROUTINE W REFLEX MICROSCOPIC
Bilirubin Urine: NEGATIVE
Glucose, UA: NEGATIVE mg/dL
Hgb urine dipstick: NEGATIVE
Ketones, ur: NEGATIVE mg/dL
Nitrite: NEGATIVE
Protein, ur: 30 mg/dL — AB
Specific Gravity, Urine: 1.014 (ref 1.005–1.030)
pH: 5 (ref 5.0–8.0)

## 2023-08-15 LAB — CBC
HCT: 29.6 % — ABNORMAL LOW (ref 36.0–46.0)
Hemoglobin: 9.6 g/dL — ABNORMAL LOW (ref 12.0–15.0)
MCH: 23.4 pg — ABNORMAL LOW (ref 26.0–34.0)
MCHC: 32.4 g/dL (ref 30.0–36.0)
MCV: 72.2 fL — ABNORMAL LOW (ref 80.0–100.0)
Platelets: 207 10*3/uL (ref 150–400)
RBC: 4.1 MIL/uL (ref 3.87–5.11)
RDW: 15.9 % — ABNORMAL HIGH (ref 11.5–15.5)
WBC: 9.1 10*3/uL (ref 4.0–10.5)
nRBC: 0.9 % — ABNORMAL HIGH (ref 0.0–0.2)

## 2023-08-15 LAB — COMPREHENSIVE METABOLIC PANEL WITH GFR
ALT: 12 U/L (ref 0–44)
AST: 15 U/L (ref 15–41)
Albumin: 2.6 g/dL — ABNORMAL LOW (ref 3.5–5.0)
Alkaline Phosphatase: 87 U/L (ref 38–126)
Anion gap: 9 (ref 5–15)
BUN: 5 mg/dL — ABNORMAL LOW (ref 6–20)
CO2: 19 mmol/L — ABNORMAL LOW (ref 22–32)
Calcium: 8.9 mg/dL (ref 8.9–10.3)
Chloride: 107 mmol/L (ref 98–111)
Creatinine, Ser: 0.72 mg/dL (ref 0.44–1.00)
GFR, Estimated: >60 mL/min (ref >60)
Glucose, Bld: 128 mg/dL — ABNORMAL HIGH (ref 70–99)
Potassium: 3.5 mmol/L (ref 3.5–5.1)
Sodium: 135 mmol/L (ref 135–145)
Total Bilirubin: 0.5 mg/dL (ref 0.0–1.2)
Total Protein: 5.7 g/dL — ABNORMAL LOW (ref 6.5–8.1)

## 2023-08-15 MED ORDER — NIFEDIPINE ER OSMOTIC RELEASE 30 MG PO TB24
30.0000 mg | ORAL_TABLET | Freq: Every day | ORAL | Status: DC
  Administered 2023-08-15: 30 mg via ORAL
  Filled 2023-08-15: qty 1

## 2023-08-15 MED ORDER — ACETAMINOPHEN-CAFFEINE 500-65 MG PO TABS
2.0000 | ORAL_TABLET | Freq: Once | ORAL | Status: AC
  Administered 2023-08-15: 2 via ORAL
  Filled 2023-08-15: qty 2

## 2023-08-15 NOTE — MAU Provider Note (Cosign Needed Addendum)
 Preterm CTX    S [name] is a 33 y.o. [phone] pregnant female at [redacted]w[redacted]d who presents to MAU today with complaint of preterm ctx. Pt states ctx started 2 days ago and worsened last night..  Has hx of preterm ctx but delivered both at term.  Was closed when last checked. Also complains of HA for 3 days that is getting worse, states no relief "with anything ... Doesn't matter what I take."  Nursing clarified that patient has not taken anything for HA.   Pt is cHTN on Procardia  but has not taken this morning.  Last ate and drank 0800 4/21, chicken biscuit.   Receives care at [facility] Prenatal records reviewed. Scheduled for C/S.   Pertinent items noted in HPI and remainder of comprehensive ROS otherwise negative.   O BP 137/82   Pulse 90   Temp 99.1 F (37.3 C) (Oral)   Resp 20   LMP 12/17/2022 (Exact Date)   SpO2 98%  Physical Exam Vitals and nursing note reviewed. Exam conducted with a chaperone present.  Constitutional:      General: She is not in acute distress.    Appearance: Normal appearance. She is obese. She is not ill-appearing.  HENT:     Head: Normocephalic and atraumatic.     Right Ear: External ear normal.     Left Ear: External ear normal.     Nose: Nose normal. No congestion.     Mouth/Throat:     Mouth: Mucous membranes are moist.     Pharynx: Oropharynx is clear.  Eyes:     Extraocular Movements: Extraocular movements intact.     Conjunctiva/sclera: Conjunctivae normal.  Cardiovascular:     Rate and Rhythm: Normal rate.  Pulmonary:     Effort: Pulmonary effort is normal. No respiratory distress.  Abdominal:     General: There is no distension.     Palpations: Abdomen is soft.     Tenderness: There is no abdominal tenderness.     Comments: Gravid   Genitourinary:    General: Normal vulva.     Vagina: No vaginal discharge.     Comments: CVE C/L/H Musculoskeletal:        General: No swelling. Normal range of motion.     Cervical back: Normal range  of motion.     Right lower leg: No edema.     Left lower leg: No edema.  Skin:    General: Skin is warm and dry.  Neurological:     Mental Status: She is alert and oriented to person, place, and time. Mental status is at baseline.     Motor: No weakness.     Gait: Gait normal.  Psychiatric:        Mood and Affect: Mood normal.        Behavior: Behavior normal.        Thought Content: Thought content normal.        Judgment: Judgment normal.   NST: 145bpm, moderate variability, +accels, no decels, no ctx    MDM: Moderate MAU Course:  Pt cHTN with HA and mild range pressures and intermittent RUQ discomfort.  Pt hasn't taken anything for it so will give previously ordered Procardia  and Excedrin.  Pt complaining of CTX however irregular on FHT and unchanged CVE.  NST reactive.    UPC 0.12 // 207 // 15/12, Cr 0.72, K 3.5  UA large LE, rare bacteria, sending for OB Ucx  HA and RUQ discomfort resolved. No  UTI symptoms. Stable for D/C.   AP #[redacted] weeks gestation #cHTN c/b HA, RUQ discomfort, resolved   Discharge from MAU in stable condition with strict/usual precautions Follow up at [facility] as scheduled for ongoing prenatal care  Allergies as of 08/15/2023       Reactions   Nsaids    Diagnosed with a perforated duodenal ulcer due to NSAIDS        Medication List     STOP taking these medications    metroNIDAZOLE  500 MG tablet Commonly known as: FLAGYL        TAKE these medications    cyclobenzaprine  5 MG tablet Commonly known as: FLEXERIL  Take 1 tablet (5 mg total) by mouth 3 (three) times daily as needed for muscle spasms. MAY CAUSE DROWSINESS, DO NOT OPERATE HEAVY MACHINERY AFTER TAKING THIS MEDICATION   cyclobenzaprine  10 MG tablet Commonly known as: FLEXERIL  Take 1 tablet (10 mg total) by mouth 3 (three) times daily as needed for muscle spasms.   Doxylamine -Pyridoxine  10-10 MG Tbec Commonly known as: Diclegis  Take 1-2 tablets by mouth at bedtime.   famotidine   40 MG tablet Commonly known as: PEPCID  Take 40 mg by mouth 2 (two) times daily.   glyBURIDE  5 MG tablet Commonly known as: DIABETA  Take 1 tablet (5 mg total) by mouth daily with breakfast.   NIFEdipine  30 MG 24 hr tablet Commonly known as: PROCARDIA -XL/NIFEDICAL-XL Take 1 tablet (30 mg total) by mouth daily.   oxyCODONE  5 MG immediate release tablet Commonly known as: Roxicodone  Take 1 tablet (5 mg total) by mouth every 4 (four) hours as needed for severe pain (pain score 7-10).   polyethylene glycol 17 g packet Commonly known as: MiraLax  Take 17 g by mouth daily.        Ebony Goldstein, MD 08/15/2023 2:27 PM

## 2023-08-15 NOTE — MAU Note (Addendum)
 [name] is a 33 y.o. at [redacted]w[redacted]d here in MAU reporting: contractions started 2 days ago, last night they were really bad and close. No bleeding or LOF.  Reports +FM.  On Procardia  for CHTN, did not take this morning, was in a hurry.   Hx of preterm contractions.  Was closed when last checked.  Had with prior pregnancies but delivered at term.  Scheduled for primary c/s, last baby was over 10# HA last 3 days, getting worse, nothing helps. Onset of complaint: 2-3 days ago Pain score: abd 10, HA10 There were no vitals filed for this visit.   FHT:150 Lab orders placed from triage: UA    Clarified pt had stated doesn't matter what I take for HA.  Questioned again what did she take for HA, "I didn't take nothin"

## 2023-08-16 DIAGNOSIS — O24415 Gestational diabetes mellitus in pregnancy, controlled by oral hypoglycemic drugs: Secondary | ICD-10-CM | POA: Diagnosis not present

## 2023-08-16 DIAGNOSIS — Z3483 Encounter for supervision of other normal pregnancy, third trimester: Secondary | ICD-10-CM | POA: Diagnosis not present

## 2023-08-16 DIAGNOSIS — Z3A34 34 weeks gestation of pregnancy: Secondary | ICD-10-CM | POA: Diagnosis not present

## 2023-08-17 LAB — CULTURE, OB URINE: Special Requests: NORMAL

## 2023-08-18 ENCOUNTER — Other Ambulatory Visit: Payer: Self-pay | Admitting: Obstetrics and Gynecology

## 2023-08-18 DIAGNOSIS — O24419 Gestational diabetes mellitus in pregnancy, unspecified control: Secondary | ICD-10-CM

## 2023-08-19 ENCOUNTER — Encounter: Payer: Self-pay | Admitting: *Deleted

## 2023-08-19 DIAGNOSIS — Z3400 Encounter for supervision of normal first pregnancy, unspecified trimester: Secondary | ICD-10-CM | POA: Diagnosis not present

## 2023-08-19 DIAGNOSIS — O99891 Other specified diseases and conditions complicating pregnancy: Secondary | ICD-10-CM | POA: Diagnosis not present

## 2023-08-19 DIAGNOSIS — Z3A35 35 weeks gestation of pregnancy: Secondary | ICD-10-CM | POA: Diagnosis not present

## 2023-08-20 ENCOUNTER — Other Ambulatory Visit: Payer: Self-pay

## 2023-08-20 ENCOUNTER — Inpatient Hospital Stay (HOSPITAL_COMMUNITY): Admitting: Certified Registered"

## 2023-08-20 ENCOUNTER — Inpatient Hospital Stay (HOSPITAL_COMMUNITY)
Admission: AD | Admit: 2023-08-20 | Discharge: 2023-08-26 | DRG: 787 | Disposition: A | Discharge status: Home / Self Care | Attending: Obstetrics and Gynecology | Admitting: Obstetrics and Gynecology

## 2023-08-20 ENCOUNTER — Encounter (HOSPITAL_COMMUNITY): Payer: Self-pay | Admitting: Obstetrics and Gynecology

## 2023-08-20 ENCOUNTER — Encounter (HOSPITAL_COMMUNITY)
Admission: AD | Disposition: A | Discharge status: Home / Self Care | Payer: Self-pay | Source: Home / Self Care | Attending: Obstetrics and Gynecology

## 2023-08-20 DIAGNOSIS — Z22358 Carrier of other enterobacterales: Secondary | ICD-10-CM | POA: Diagnosis not present

## 2023-08-20 DIAGNOSIS — O1413 Severe pre-eclampsia, third trimester: Principal | ICD-10-CM

## 2023-08-20 DIAGNOSIS — K219 Gastro-esophageal reflux disease without esophagitis: Secondary | ICD-10-CM | POA: Diagnosis present

## 2023-08-20 DIAGNOSIS — K5900 Constipation, unspecified: Secondary | ICD-10-CM | POA: Diagnosis present

## 2023-08-20 DIAGNOSIS — O2412 Pre-existing diabetes mellitus, type 2, in childbirth: Secondary | ICD-10-CM | POA: Diagnosis present

## 2023-08-20 DIAGNOSIS — Z8249 Family history of ischemic heart disease and other diseases of the circulatory system: Secondary | ICD-10-CM | POA: Diagnosis not present

## 2023-08-20 DIAGNOSIS — O9962 Diseases of the digestive system complicating childbirth: Secondary | ICD-10-CM | POA: Diagnosis present

## 2023-08-20 DIAGNOSIS — D573 Sickle-cell trait: Secondary | ICD-10-CM | POA: Diagnosis present

## 2023-08-20 DIAGNOSIS — O2293 Venous complication in pregnancy, unspecified, third trimester: Secondary | ICD-10-CM | POA: Diagnosis not present

## 2023-08-20 DIAGNOSIS — B9620 Unspecified Escherichia coli [E. coli] as the cause of diseases classified elsewhere: Secondary | ICD-10-CM | POA: Diagnosis present

## 2023-08-20 DIAGNOSIS — O99214 Obesity complicating childbirth: Secondary | ICD-10-CM | POA: Diagnosis not present

## 2023-08-20 DIAGNOSIS — E66813 Obesity, class 3: Secondary | ICD-10-CM | POA: Diagnosis present

## 2023-08-20 DIAGNOSIS — O1414 Severe pre-eclampsia complicating childbirth: Secondary | ICD-10-CM

## 2023-08-20 DIAGNOSIS — O1203 Gestational edema, third trimester: Secondary | ICD-10-CM | POA: Diagnosis not present

## 2023-08-20 DIAGNOSIS — O24424 Gestational diabetes mellitus in childbirth, insulin controlled: Secondary | ICD-10-CM | POA: Diagnosis not present

## 2023-08-20 DIAGNOSIS — O9972 Diseases of the skin and subcutaneous tissue complicating childbirth: Secondary | ICD-10-CM | POA: Diagnosis present

## 2023-08-20 DIAGNOSIS — Z3A35 35 weeks gestation of pregnancy: Secondary | ICD-10-CM

## 2023-08-20 DIAGNOSIS — Z7984 Long term (current) use of oral hypoglycemic drugs: Secondary | ICD-10-CM

## 2023-08-20 DIAGNOSIS — O9081 Anemia of the puerperium: Secondary | ICD-10-CM | POA: Diagnosis not present

## 2023-08-20 DIAGNOSIS — R030 Elevated blood-pressure reading, without diagnosis of hypertension: Secondary | ICD-10-CM | POA: Diagnosis present

## 2023-08-20 DIAGNOSIS — L989 Disorder of the skin and subcutaneous tissue, unspecified: Secondary | ICD-10-CM | POA: Diagnosis not present

## 2023-08-20 DIAGNOSIS — O3663X0 Maternal care for excessive fetal growth, third trimester, not applicable or unspecified: Secondary | ICD-10-CM | POA: Diagnosis present

## 2023-08-20 DIAGNOSIS — Z833 Family history of diabetes mellitus: Secondary | ICD-10-CM

## 2023-08-20 DIAGNOSIS — D62 Acute posthemorrhagic anemia: Secondary | ICD-10-CM | POA: Diagnosis not present

## 2023-08-20 DIAGNOSIS — Z1612 Extended spectrum beta lactamase (ESBL) resistance: Secondary | ICD-10-CM | POA: Diagnosis present

## 2023-08-20 DIAGNOSIS — O114 Pre-existing hypertension with pre-eclampsia, complicating childbirth: Secondary | ICD-10-CM | POA: Diagnosis present

## 2023-08-20 DIAGNOSIS — O43893 Other placental disorders, third trimester: Secondary | ICD-10-CM | POA: Diagnosis not present

## 2023-08-20 DIAGNOSIS — Z3490 Encounter for supervision of normal pregnancy, unspecified, unspecified trimester: Secondary | ICD-10-CM

## 2023-08-20 DIAGNOSIS — E119 Type 2 diabetes mellitus without complications: Secondary | ICD-10-CM | POA: Diagnosis present

## 2023-08-20 DIAGNOSIS — O1092 Unspecified pre-existing hypertension complicating childbirth: Secondary | ICD-10-CM | POA: Diagnosis present

## 2023-08-20 LAB — PROTEIN / CREATININE RATIO, URINE
Creatinine, Urine: 142 mg/dL
Protein Creatinine Ratio: 0.12 mg/mg{creat} (ref 0.00–0.15)
Total Protein, Urine: 17 mg/dL

## 2023-08-20 LAB — URINALYSIS, ROUTINE W REFLEX MICROSCOPIC
Bilirubin Urine: NEGATIVE
Glucose, UA: NEGATIVE mg/dL
Hgb urine dipstick: NEGATIVE
Ketones, ur: NEGATIVE mg/dL
Nitrite: NEGATIVE
Protein, ur: NEGATIVE mg/dL
Specific Gravity, Urine: 1.011 (ref 1.005–1.030)
pH: 6 (ref 5.0–8.0)

## 2023-08-20 LAB — COMPREHENSIVE METABOLIC PANEL WITH GFR
ALT: 13 U/L (ref 0–44)
AST: 14 U/L — ABNORMAL LOW (ref 15–41)
Albumin: 2.6 g/dL — ABNORMAL LOW (ref 3.5–5.0)
Alkaline Phosphatase: 90 U/L (ref 38–126)
Anion gap: 10 (ref 5–15)
BUN: 5 mg/dL — ABNORMAL LOW (ref 6–20)
CO2: 21 mmol/L — ABNORMAL LOW (ref 22–32)
Calcium: 9.9 mg/dL (ref 8.9–10.3)
Chloride: 105 mmol/L (ref 98–111)
Creatinine, Ser: 0.74 mg/dL (ref 0.44–1.00)
GFR, Estimated: >60 mL/min (ref >60)
Glucose, Bld: 122 mg/dL — ABNORMAL HIGH (ref 70–99)
Potassium: 3.4 mmol/L — ABNORMAL LOW (ref 3.5–5.1)
Sodium: 136 mmol/L (ref 135–145)
Total Bilirubin: 0.5 mg/dL (ref 0.0–1.2)
Total Protein: 5.8 g/dL — ABNORMAL LOW (ref 6.5–8.1)

## 2023-08-20 LAB — CBC
HCT: 29.6 % — ABNORMAL LOW (ref 36.0–46.0)
HCT: 27.6 % — ABNORMAL LOW (ref 36.0–46.0)
Hemoglobin: 9.5 g/dL — ABNORMAL LOW (ref 12.0–15.0)
Hemoglobin: 8.9 g/dL — ABNORMAL LOW (ref 12.0–15.0)
MCH: 23.4 pg — ABNORMAL LOW (ref 26.0–34.0)
MCH: 23.7 pg — ABNORMAL LOW (ref 26.0–34.0)
MCHC: 32.1 g/dL (ref 30.0–36.0)
MCHC: 32.2 g/dL (ref 30.0–36.0)
MCV: 72.9 fL — ABNORMAL LOW (ref 80.0–100.0)
MCV: 73.4 fL — ABNORMAL LOW (ref 80.0–100.0)
Platelets: 223 10*3/uL (ref 150–400)
Platelets: 216 10*3/uL (ref 150–400)
RBC: 4.06 MIL/uL (ref 3.87–5.11)
RBC: 3.76 MIL/uL — ABNORMAL LOW (ref 3.87–5.11)
RDW: 16.1 % — ABNORMAL HIGH (ref 11.5–15.5)
RDW: 16.1 % — ABNORMAL HIGH (ref 11.5–15.5)
WBC: 10.3 10*3/uL (ref 4.0–10.5)
WBC: 12.6 10*3/uL — ABNORMAL HIGH (ref 4.0–10.5)
nRBC: 0.7 % — ABNORMAL HIGH (ref 0.0–0.2)
nRBC: 0.4 % — ABNORMAL HIGH (ref 0.0–0.2)

## 2023-08-20 LAB — GLUCOSE, CAPILLARY
Glucose-Capillary: 137 mg/dL — ABNORMAL HIGH (ref 70–99)
Glucose-Capillary: 152 mg/dL — ABNORMAL HIGH (ref 70–99)

## 2023-08-20 LAB — HEMOGLOBIN A1C
Hgb A1c MFr Bld: 6.5 % — ABNORMAL HIGH (ref 4.8–5.6)
Mean Plasma Glucose: 139.85 mg/dL

## 2023-08-20 LAB — RPR: RPR Ser Ql: NONREACTIVE

## 2023-08-20 LAB — TYPE AND SCREEN
ABO/RH(D): O POS
Antibody Screen: NEGATIVE

## 2023-08-20 SURGERY — Surgical Case
Anesthesia: Spinal | Site: Abdomen

## 2023-08-20 MED ORDER — ACETAMINOPHEN 500 MG PO TABS
1000.0000 mg | ORAL_TABLET | Freq: Four times a day (QID) | ORAL | Status: DC
  Administered 2023-08-20 – 2023-08-26 (×21): 1000 mg via ORAL
  Filled 2023-08-20 (×22): qty 2

## 2023-08-20 MED ORDER — LABETALOL HCL 5 MG/ML IV SOLN: 20.0000 mg | INTRAVENOUS | Status: DC | PRN

## 2023-08-20 MED ORDER — ACETAMINOPHEN-CAFFEINE 500-65 MG PO TABS
2.0000 | ORAL_TABLET | Freq: Once | ORAL | Status: AC
  Administered 2023-08-20: 2 via ORAL
  Filled 2023-08-20: qty 2

## 2023-08-20 MED ORDER — COCONUT OIL OIL: 1.0000 | TOPICAL_OIL | Status: DC | PRN

## 2023-08-20 MED ORDER — WITCH HAZEL-GLYCERIN EX PADS: 1.0000 | MEDICATED_PAD | CUTANEOUS | Status: DC | PRN

## 2023-08-20 MED ORDER — HYDRALAZINE HCL 20 MG/ML IJ SOLN: 5.0000 mg | INTRAMUSCULAR | Status: DC | PRN

## 2023-08-20 MED ORDER — FENTANYL CITRATE (PF) 100 MCG/2ML IJ SOLN
INTRAMUSCULAR | Status: AC
  Filled 2023-08-20: qty 2

## 2023-08-20 MED ORDER — MEPERIDINE HCL 25 MG/ML IJ SOLN: 6.2500 mg | INTRAMUSCULAR | Status: DC | PRN

## 2023-08-20 MED ORDER — PRENATAL MULTIVITAMIN CH
1.0000 | ORAL_TABLET | Freq: Every day | ORAL | Status: DC
  Administered 2023-08-21: 1 via ORAL
  Filled 2023-08-20 (×3): qty 1

## 2023-08-20 MED ORDER — LACTATED RINGERS IV SOLN: INTRAVENOUS | Status: AC

## 2023-08-20 MED ORDER — SCOPOLAMINE 1 MG/3DAYS TD PT72
1.0000 | MEDICATED_PATCH | Freq: Once | TRANSDERMAL | Status: DC
  Administered 2023-08-20: 1.5 mg via TRANSDERMAL
  Filled 2023-08-20: qty 1

## 2023-08-20 MED ORDER — NALOXONE HCL 4 MG/10ML IJ SOLN: 1.0000 ug/kg/h | INTRAVENOUS | Status: DC | PRN

## 2023-08-20 MED ORDER — MORPHINE SULFATE (PF) 0.5 MG/ML IJ SOLN
INTRAMUSCULAR | Status: DC | PRN
  Administered 2023-08-20: .15 mg via INTRATHECAL

## 2023-08-20 MED ORDER — ACETAMINOPHEN 500 MG PO TABS
1000.0000 mg | ORAL_TABLET | Freq: Four times a day (QID) | ORAL | Status: DC
Start: 2023-08-20 — End: 2023-08-20

## 2023-08-20 MED ORDER — HYDROMORPHONE HCL 1 MG/ML IJ SOLN
0.5000 mg | Freq: Once | INTRAMUSCULAR | Status: AC
  Administered 2023-08-20: 0.5 mg via INTRAVENOUS

## 2023-08-20 MED ORDER — DIPHENHYDRAMINE HCL 25 MG PO CAPS: 25.0000 mg | ORAL_CAPSULE | ORAL | Status: DC | PRN

## 2023-08-20 MED ORDER — HYDRALAZINE HCL 20 MG/ML IJ SOLN: 10.0000 mg | INTRAMUSCULAR | Status: DC | PRN

## 2023-08-20 MED ORDER — LABETALOL HCL 5 MG/ML IV SOLN
40.0000 mg | INTRAVENOUS | Status: DC | PRN
  Administered 2023-08-20: 40 mg via INTRAVENOUS
  Filled 2023-08-20: qty 8

## 2023-08-20 MED ORDER — OXYTOCIN-SODIUM CHLORIDE 30-0.9 UT/500ML-% IV SOLN
INTRAVENOUS | Status: DC | PRN
  Administered 2023-08-20: 30 [IU] via INTRAVENOUS

## 2023-08-20 MED ORDER — SIMETHICONE 80 MG PO CHEW
80.0000 mg | CHEWABLE_TABLET | ORAL | Status: DC | PRN
  Administered 2023-08-24: 80 mg via ORAL
  Filled 2023-08-20: qty 1

## 2023-08-20 MED ORDER — DIBUCAINE (PERIANAL) 1 % EX OINT: 1.0000 | TOPICAL_OINTMENT | CUTANEOUS | Status: DC | PRN

## 2023-08-20 MED ORDER — INSULIN ASPART 100 UNIT/ML IJ SOLN
0.0000 [IU] | Freq: Three times a day (TID) | INTRAMUSCULAR | Status: DC
Start: 2023-08-20 — End: 2023-08-24
  Administered 2023-08-21: 4 [IU] via SUBCUTANEOUS

## 2023-08-20 MED ORDER — EPHEDRINE SULFATE-NACL 50-0.9 MG/10ML-% IV SOSY
PREFILLED_SYRINGE | INTRAVENOUS | Status: DC | PRN
  Administered 2023-08-20: 5 mg via INTRAVENOUS

## 2023-08-20 MED ORDER — NALBUPHINE HCL 10 MG/ML IJ SOLN
INTRAMUSCULAR | Status: AC
  Filled 2023-08-20: qty 1

## 2023-08-20 MED ORDER — CHLORHEXIDINE GLUCONATE 0.12 % MT SOLN: 15.0000 mL | Freq: Once | OROMUCOSAL | Status: DC

## 2023-08-20 MED ORDER — OXYCODONE HCL 5 MG PO TABS
5.0000 mg | ORAL_TABLET | ORAL | Status: DC | PRN
  Administered 2023-08-20 – 2023-08-23 (×8): 10 mg via ORAL
  Administered 2023-08-23: 5 mg via ORAL
  Administered 2023-08-23 – 2023-08-25 (×8): 10 mg via ORAL
  Filled 2023-08-20 (×18): qty 2

## 2023-08-20 MED ORDER — LABETALOL HCL 5 MG/ML IV SOLN: 80.0000 mg | INTRAVENOUS | Status: DC | PRN

## 2023-08-20 MED ORDER — OXYTOCIN-SODIUM CHLORIDE 30-0.9 UT/500ML-% IV SOLN
INTRAVENOUS | Status: AC
  Filled 2023-08-20: qty 500

## 2023-08-20 MED ORDER — LACTATED RINGERS IV SOLN: INTRAVENOUS | Status: DC

## 2023-08-20 MED ORDER — ONDANSETRON HCL 4 MG/2ML IJ SOLN
INTRAMUSCULAR | Status: AC
  Filled 2023-08-20: qty 2

## 2023-08-20 MED ORDER — FAMOTIDINE 20 MG PO TABS
20.0000 mg | ORAL_TABLET | Freq: Once | ORAL | Status: AC
  Administered 2023-08-20: 20 mg via ORAL
  Filled 2023-08-20: qty 1

## 2023-08-20 MED ORDER — NALBUPHINE HCL 10 MG/ML IJ SOLN: 2.5000 mg | INTRAMUSCULAR | Status: DC | PRN

## 2023-08-20 MED ORDER — MORPHINE SULFATE (PF) 0.5 MG/ML IJ SOLN
INTRAMUSCULAR | Status: AC
Start: 2023-08-20 — End: ?
  Filled 2023-08-20: qty 10

## 2023-08-20 MED ORDER — MENTHOL 3 MG MT LOZG: 1.0000 | LOZENGE | OROMUCOSAL | Status: DC | PRN

## 2023-08-20 MED ORDER — FENTANYL CITRATE (PF) 100 MCG/2ML IJ SOLN: 25.0000 ug | INTRAMUSCULAR | Status: DC | PRN

## 2023-08-20 MED ORDER — LABETALOL HCL 5 MG/ML IV SOLN
20.0000 mg | INTRAVENOUS | Status: DC | PRN
Start: 2023-08-20 — End: 2023-08-20
  Administered 2023-08-20: 20 mg via INTRAVENOUS

## 2023-08-20 MED ORDER — SIMETHICONE 80 MG PO CHEW
80.0000 mg | CHEWABLE_TABLET | Freq: Three times a day (TID) | ORAL | Status: DC
  Administered 2023-08-20 – 2023-08-26 (×14): 80 mg via ORAL
  Filled 2023-08-20 (×17): qty 1

## 2023-08-20 MED ORDER — DIPHENHYDRAMINE HCL 50 MG/ML IJ SOLN
12.5000 mg | INTRAMUSCULAR | Status: DC | PRN
  Administered 2023-08-20 (×2): 12.5 mg via INTRAVENOUS
  Filled 2023-08-20 (×2): qty 1

## 2023-08-20 MED ORDER — AMISULPRIDE (ANTIEMETIC) 5 MG/2ML IV SOLN: 10.0000 mg | Freq: Once | INTRAVENOUS | Status: DC | PRN

## 2023-08-20 MED ORDER — OXYTOCIN-SODIUM CHLORIDE 30-0.9 UT/500ML-% IV SOLN: 2.5000 [IU]/h | INTRAVENOUS | Status: DC

## 2023-08-20 MED ORDER — DIPHENHYDRAMINE HCL 50 MG/ML IJ SOLN
25.0000 mg | Freq: Once | INTRAMUSCULAR | Status: AC
  Administered 2023-08-20: 25 mg via INTRAVENOUS
  Filled 2023-08-20: qty 1

## 2023-08-20 MED ORDER — ZOLPIDEM TARTRATE 5 MG PO TABS
5.0000 mg | ORAL_TABLET | Freq: Every evening | ORAL | Status: DC | PRN
  Administered 2023-08-24 – 2023-08-26 (×2): 5 mg via ORAL
  Filled 2023-08-20 (×2): qty 1

## 2023-08-20 MED ORDER — FAMOTIDINE 20 MG PO TABS
40.0000 mg | ORAL_TABLET | Freq: Two times a day (BID) | ORAL | Status: DC
  Administered 2023-08-20 – 2023-08-26 (×9): 40 mg via ORAL
  Filled 2023-08-20 (×13): qty 2

## 2023-08-20 MED ORDER — DIPHENHYDRAMINE HCL 50 MG/ML IJ SOLN
INTRAMUSCULAR | Status: AC
  Filled 2023-08-20: qty 1

## 2023-08-20 MED ORDER — SODIUM CHLORIDE 0.9% FLUSH: 3.0000 mL | INTRAVENOUS | Status: DC | PRN

## 2023-08-20 MED ORDER — SOD CITRATE-CITRIC ACID 500-334 MG/5ML PO SOLN
30.0000 mL | Freq: Once | ORAL | Status: AC
  Administered 2023-08-20: 30 mL via ORAL
  Filled 2023-08-20: qty 30

## 2023-08-20 MED ORDER — HYDROMORPHONE HCL 1 MG/ML IJ SOLN
INTRAMUSCULAR | Status: AC
  Filled 2023-08-20: qty 0.5

## 2023-08-20 MED ORDER — ONDANSETRON HCL 4 MG/2ML IJ SOLN: 4.0000 mg | Freq: Once | INTRAMUSCULAR | Status: DC | PRN

## 2023-08-20 MED ORDER — PHENYLEPHRINE HCL-NACL 20-0.9 MG/250ML-% IV SOLN
INTRAVENOUS | Status: DC | PRN
  Administered 2023-08-20: 60 ug/min via INTRAVENOUS

## 2023-08-20 MED ORDER — DIPHENHYDRAMINE HCL 50 MG/ML IJ SOLN
INTRAMUSCULAR | Status: DC | PRN
  Administered 2023-08-20: 12.5 mg via INTRAVENOUS

## 2023-08-20 MED ORDER — METOCLOPRAMIDE HCL 5 MG/ML IJ SOLN
10.0000 mg | Freq: Once | INTRAMUSCULAR | Status: AC
  Administered 2023-08-20: 10 mg via INTRAVENOUS
  Filled 2023-08-20: qty 2

## 2023-08-20 MED ORDER — NALOXONE HCL 0.4 MG/ML IJ SOLN: 0.4000 mg | INTRAMUSCULAR | Status: DC | PRN

## 2023-08-20 MED ORDER — SODIUM CHLORIDE 0.9 % IR SOLN
Status: DC | PRN
  Administered 2023-08-20: 1

## 2023-08-20 MED ORDER — PHENYLEPHRINE 80 MCG/ML (10ML) SYRINGE FOR IV PUSH (FOR BLOOD PRESSURE SUPPORT)
PREFILLED_SYRINGE | INTRAVENOUS | Status: AC
  Filled 2023-08-20: qty 10

## 2023-08-20 MED ORDER — LACTATED RINGERS IV BOLUS: 1000.0000 mL | Freq: Once | INTRAVENOUS | Status: DC

## 2023-08-20 MED ORDER — DIPHENHYDRAMINE HCL 25 MG PO CAPS
25.0000 mg | ORAL_CAPSULE | Freq: Four times a day (QID) | ORAL | Status: DC | PRN
  Administered 2023-08-21: 25 mg via ORAL
  Filled 2023-08-20: qty 1

## 2023-08-20 MED ORDER — SENNOSIDES-DOCUSATE SODIUM 8.6-50 MG PO TABS
2.0000 | ORAL_TABLET | Freq: Every day | ORAL | Status: DC
  Administered 2023-08-21 – 2023-08-26 (×5): 2 via ORAL
  Filled 2023-08-20 (×6): qty 2

## 2023-08-20 MED ORDER — HYDROMORPHONE HCL 1 MG/ML IJ SOLN
0.2000 mg | INTRAMUSCULAR | Status: DC | PRN
  Administered 2023-08-20: 0.6 mg via INTRAVENOUS
  Administered 2023-08-20: 0.5 mg via INTRAVENOUS
  Administered 2023-08-22 – 2023-08-24 (×3): 0.6 mg via INTRAVENOUS
  Filled 2023-08-20 (×4): qty 1

## 2023-08-20 MED ORDER — BUPIVACAINE IN DEXTROSE 0.75-8.25 % IT SOLN
INTRATHECAL | Status: DC | PRN
  Administered 2023-08-20: 1.6 mL via INTRATHECAL

## 2023-08-20 MED ORDER — HYDRALAZINE HCL 20 MG/ML IJ SOLN
5.0000 mg | Freq: Once | INTRAMUSCULAR | Status: AC
  Administered 2023-08-20: 5 mg via INTRAVENOUS
  Filled 2023-08-20: qty 1

## 2023-08-20 MED ORDER — MAGNESIUM SULFATE BOLUS VIA INFUSION
4.0000 g | Freq: Once | INTRAVENOUS | Status: AC
  Administered 2023-08-20: 4 g via INTRAVENOUS
  Filled 2023-08-20: qty 1000

## 2023-08-20 MED ORDER — NIFEDIPINE ER OSMOTIC RELEASE 30 MG PO TB24: 30.0000 mg | ORAL_TABLET | Freq: Every day | ORAL | Status: DC

## 2023-08-20 MED ORDER — LABETALOL HCL 5 MG/ML IV SOLN
INTRAVENOUS | Status: AC
  Filled 2023-08-20: qty 4

## 2023-08-20 MED ORDER — ONDANSETRON HCL 4 MG/2ML IJ SOLN
4.0000 mg | Freq: Three times a day (TID) | INTRAMUSCULAR | Status: DC | PRN
  Administered 2023-08-23: 4 mg via INTRAVENOUS
  Filled 2023-08-20: qty 2

## 2023-08-20 MED ORDER — PHENYLEPHRINE 80 MCG/ML (10ML) SYRINGE FOR IV PUSH (FOR BLOOD PRESSURE SUPPORT)
PREFILLED_SYRINGE | INTRAVENOUS | Status: DC | PRN
  Administered 2023-08-20: 80 ug via INTRAVENOUS

## 2023-08-20 MED ORDER — TRANEXAMIC ACID-NACL 1000-0.7 MG/100ML-% IV SOLN
1000.0000 mg | INTRAVENOUS | Status: AC
  Administered 2023-08-20: 1000 mg via INTRAVENOUS

## 2023-08-20 MED ORDER — ONDANSETRON HCL 4 MG/2ML IJ SOLN
INTRAMUSCULAR | Status: DC | PRN
  Administered 2023-08-20: 4 mg via INTRAVENOUS

## 2023-08-20 MED ORDER — CEFAZOLIN SODIUM-DEXTROSE 2-4 GM/100ML-% IV SOLN
2.0000 g | Freq: Once | INTRAVENOUS | Status: AC
  Administered 2023-08-20: 2 g via INTRAVENOUS
  Filled 2023-08-20: qty 100

## 2023-08-20 MED ORDER — FENTANYL CITRATE (PF) 100 MCG/2ML IJ SOLN
INTRAMUSCULAR | Status: DC | PRN
  Administered 2023-08-20: 15 ug via INTRATHECAL

## 2023-08-20 MED ORDER — SCOPOLAMINE 1 MG/3DAYS TD PT72: 1.0000 | MEDICATED_PATCH | Freq: Once | TRANSDERMAL | Status: DC

## 2023-08-20 MED ORDER — ORAL CARE MOUTH RINSE: 15.0000 mL | Freq: Once | OROMUCOSAL | Status: DC

## 2023-08-20 MED ORDER — LABETALOL HCL 5 MG/ML IV SOLN: 40.0000 mg | INTRAVENOUS | Status: DC | PRN

## 2023-08-20 MED ORDER — STERILE WATER FOR IRRIGATION IR SOLN
Status: DC | PRN
  Administered 2023-08-20: 1000 mL

## 2023-08-20 MED ORDER — MAGNESIUM SULFATE 40 GM/1000ML IV SOLN
2.0000 g/h | INTRAVENOUS | Status: AC
  Administered 2023-08-20 – 2023-08-21 (×2): 2 g/h via INTRAVENOUS
  Filled 2023-08-20 (×2): qty 1000

## 2023-08-20 SURGICAL SUPPLY — 32 items
BENZOIN TINCTURE PRP APPL 2/3 (GAUZE/BANDAGES/DRESSINGS) IMPLANT
CANISTER PREVENA PLUS 150 (CANNISTER) IMPLANT
CHLORAPREP W/TINT 26 (MISCELLANEOUS) ×2 IMPLANT
CLAMP UMBILICAL CORD (MISCELLANEOUS) ×1 IMPLANT
CLOTH BEACON ORANGE TIMEOUT ST (SAFETY) ×1 IMPLANT
DRESSING PREVENA PLUS CUSTOM (GAUZE/BANDAGES/DRESSINGS) IMPLANT
DRSG OPSITE POSTOP 4X10 (GAUZE/BANDAGES/DRESSINGS) ×1 IMPLANT
ELECTRODE REM PT RTRN 9FT ADLT (ELECTROSURGICAL) ×1 IMPLANT
EXTRACTOR VACUUM M CUP 4 TUBE (SUCTIONS) IMPLANT
GLOVE BIOGEL PI IND STRL 7.0 (GLOVE) ×2 IMPLANT
GLOVE BIOGEL PI IND STRL 7.5 (GLOVE) ×1 IMPLANT
GLOVE ECLIPSE 7.0 STRL STRAW (GLOVE) ×1 IMPLANT
GOWN STRL REUS W/TWL LRG LVL3 (GOWN DISPOSABLE) ×2 IMPLANT
HEMOSTAT ARISTA ABSORB 3G PWDR (HEMOSTASIS) IMPLANT
KIT ABG SYR 3ML LUER SLIP (SYRINGE) IMPLANT
MAT PREVALON FULL STRYKER (MISCELLANEOUS) IMPLANT
NDL HYPO 25X5/8 SAFETYGLIDE (NEEDLE) IMPLANT
NEEDLE HYPO 25X5/8 SAFETYGLIDE (NEEDLE) IMPLANT
NS IRRIG 1000ML POUR BTL (IV SOLUTION) ×1 IMPLANT
PACK C SECTION WH (CUSTOM PROCEDURE TRAY) ×1 IMPLANT
PAD OB MATERNITY 4.3X12.25 (PERSONAL CARE ITEMS) ×1 IMPLANT
RETRACTOR TRAXI PANNICULUS (MISCELLANEOUS) IMPLANT
STRIP CLOSURE SKIN 1/2X4 (GAUZE/BANDAGES/DRESSINGS) IMPLANT
SUT PDS AB 0 CTX 60 (SUTURE) ×2 IMPLANT
SUT PLAIN 0 NONE (SUTURE) IMPLANT
SUT VIC AB 0 CT1 36 (SUTURE) IMPLANT
SUT VIC AB 0 CTX36XBRD ANBCTRL (SUTURE) ×3 IMPLANT
SUT VIC AB 4-0 KS 27 (SUTURE) ×1 IMPLANT
SUT VIC AB 4-0 PS2 27 (SUTURE) IMPLANT
TOWEL OR 17X24 6PK STRL BLUE (TOWEL DISPOSABLE) ×1 IMPLANT
TRAY FOLEY W/BAG SLVR 14FR LF (SET/KITS/TRAYS/PACK) ×1 IMPLANT
WATER STERILE IRR 1000ML POUR (IV SOLUTION) ×1 IMPLANT

## 2023-08-20 NOTE — Plan of Care (Signed)
 Problem: Education: Goal: Knowledge of disease or condition will improve Outcome: Progressing Goal: Knowledge of the prescribed therapeutic regimen will improve Outcome: Progressing   Problem: Fluid Volume: Goal: Peripheral tissue perfusion will improve Outcome: Progressing   Problem: Clinical Measurements: Goal: Complications related to disease process, condition or treatment will be avoided or minimized Outcome: Progressing   Problem: Education: Goal: Knowledge of General Education information will improve Description: Including pain rating scale, medication(s)/side effects and non-pharmacologic comfort measures Outcome: Progressing   Problem: Health Behavior/Discharge Planning: Goal: Ability to manage health-related needs will improve Outcome: Progressing   Problem: Clinical Measurements: Goal: Ability to maintain clinical measurements within normal limits will improve Outcome: Progressing Goal: Will remain free from infection Outcome: Progressing Goal: Diagnostic test results will improve Outcome: Progressing Goal: Respiratory complications will improve Outcome: Progressing Goal: Cardiovascular complication will be avoided Outcome: Progressing   Problem: Activity: Goal: Risk for activity intolerance will decrease Outcome: Progressing   Problem: Nutrition: Goal: Adequate nutrition will be maintained Outcome: Progressing   Problem: Coping: Goal: Level of anxiety will decrease Outcome: Progressing   Problem: Elimination: Goal: Will not experience complications related to bowel motility Outcome: Progressing Goal: Will not experience complications related to urinary retention Outcome: Progressing   Problem: Pain Managment: Goal: General experience of comfort will improve and/or be controlled Outcome: Progressing   Problem: Safety: Goal: Ability to remain free from injury will improve Outcome: Progressing   Problem: Skin Integrity: Goal: Risk for impaired  skin integrity will decrease Outcome: Progressing   Problem: Education: Goal: Knowledge of the prescribed therapeutic regimen will improve Outcome: Progressing Goal: Understanding of sexual limitations or changes related to disease process or condition will improve Outcome: Progressing Goal: Individualized Educational Video(s) Outcome: Progressing   Problem: Self-Concept: Goal: Communication of feelings regarding changes in body function or appearance will improve Outcome: Progressing   Problem: Skin Integrity: Goal: Demonstration of wound healing without infection will improve Outcome: Progressing   Problem: Education: Goal: Knowledge of condition will improve Outcome: Progressing Goal: Individualized Educational Video(s) Outcome: Progressing Goal: Individualized Newborn Educational Video(s) Outcome: Progressing   Problem: Activity: Goal: Will verbalize the importance of balancing activity with adequate rest periods Outcome: Progressing Goal: Ability to tolerate increased activity will improve Outcome: Progressing   Problem: Coping: Goal: Ability to identify and utilize available resources and services will improve Outcome: Progressing   Problem: Life Cycle: Goal: Chance of risk for complications during the postpartum period will decrease Outcome: Progressing   Problem: Role Relationship: Goal: Ability to demonstrate positive interaction with newborn will improve Outcome: Progressing   Problem: Skin Integrity: Goal: Demonstration of wound healing without infection will improve Outcome: Progressing   Problem: Education: Goal: Ability to describe self-care measures that may prevent or decrease complications (Diabetes Survival Skills Education) will improve Outcome: Progressing Goal: Individualized Educational Video(s) Outcome: Progressing   Problem: Coping: Goal: Ability to adjust to condition or change in health will improve Outcome: Progressing   Problem:  Education: Goal: Knowledge of condition will improve Outcome: Progressing Goal: Individualized Educational Video(s) Outcome: Progressing Goal: Individualized Newborn Educational Video(s) Outcome: Progressing   Problem: Activity: Goal: Will verbalize the importance of balancing activity with adequate rest periods Outcome: Progressing Goal: Ability to tolerate increased activity will improve Outcome: Progressing   Problem: Coping: Goal: Ability to identify and utilize available resources and services will improve Outcome: Progressing   Problem: Life Cycle: Goal: Chance of risk for complications during the postpartum period will decrease Outcome: Progressing   Problem: Role Relationship: Goal: Ability  to demonstrate positive interaction with newborn will improve Outcome: Progressing   Problem: Skin Integrity: Goal: Demonstration of wound healing without infection will improve Outcome: Progressing

## 2023-08-20 NOTE — MAU Note (Signed)
 Pt says she feels UC's - 10/10 PNC- No VE in office . Was here on Monday - VE - closed  Feet swollen and hurting  Has H/A - started today at 3pm- took Xs Tyl 2 tabs - some relief.  Has black spots - started 2 weeks ago -and blurry vision  No epigastric pain

## 2023-08-20 NOTE — Anesthesia Procedure Notes (Signed)
 Spinal  Patient location during procedure: OR Start time: 08/20/2023 7:51 AM End time: 08/20/2023 7:54 AM Reason for block: surgical anesthesia Staffing Performed: anesthesiologist  Anesthesiologist: Erin Havers, MD Performed by: Erin Havers, MD Authorized by: Erin Havers, MD   Preanesthetic Checklist Completed: patient identified, IV checked, risks and benefits discussed, surgical consent, monitors and equipment checked, pre-op evaluation and timeout performed Spinal Block Patient position: sitting Prep: DuraPrep and site prepped and draped Patient monitoring: continuous pulse ox and blood pressure Approach: midline Location: L3-4 Injection technique: single-shot Needle Needle type: Pencan  Needle gauge: 24 G Assessment Sensory level: T4 Events: CSF return Additional Notes Functioning IV was confirmed and monitors were applied. Sterile prep and drape, including hand hygiene, mask and sterile gloves were used. The patient was positioned and the spine was prepped. The skin was anesthetized with lidocaine .  Free flow of clear CSF was obtained prior to injecting local anesthetic into the CSF.  The spinal needle aspirated freely following injection.  The needle was carefully withdrawn.  The patient tolerated the procedure well. Consent was obtained prior to procedure with all questions answered and concerns addressed. Risks including but not limited to bleeding, infection, nerve damage, paralysis, failed block, inadequate analgesia, allergic reaction, high spinal, itching and headache were discussed and the patient wished to proceed.   Gwenevere Lent, MD

## 2023-08-20 NOTE — H&P (Signed)
 [name] is a 33 y.o. female presenting for BP elevation, HA, contractions, and vision changes. Also swelling. She has cHTN and has been on Procardia  30mg  XL. Upon arrival to MAU was treated with Labetalol IV 20 and then again with 40 s/s persistent elevation to severe range of Bps. She was started on IV Mag. PIH labs were fine except for chronic anemia. BS was 122.  This pregnancy has been c/b by cHTN on Procardia  as above as well as poorly controlled DM. She has been extensively counseled in the office regarding her poor control and the risks. Despite this, she has continued to refuse further treatment for her gestational diabetes. She has been on Glyburide  for her GDM. She has known LGA with EFW of 7#2 on last US  (08/16/23) which is >97%ile. AC >97%ile. She does have a proven pelvis to 9#8 although this was c/b a third degree lac. First delivery was a VAVD of a 7#8 infant.  She is morbidly obese. She has been very clear of her desire for a primary elective cesarean section regardless of the risks even prior to this evaluation in the MAU. She declines a BTL.  She has a history of ab abnl pap and has fu pp for that. 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  Of note, she has a history of a duodenal ulcer with perforation that required surgery. She thus has a midline UPPER abdominal scar. She also has a history of laparoscopic ovarian cystectomy.    OB History     Gravida  5   Para  2   Term  2   Preterm  0   AB  2   Living  2      SAB  2   IAB  0   Ectopic  0   Multiple  0   Live Births  2        Obstetric Comments  2018 induced for HTN, reports HTN with first also, though wasn't induced        Past Medical History:  Diagnosis Date   Anemia    Constipation    Diabetes mellitus without complication (HCC)    Gastric ulcer    Gestational diabetes    History of dysmenorrhea    Hypertension    Hypoglycemia    Ovarian cyst 2011    Postpartum care and examination 05/17/2017   Pregnancy    Seasonal allergies 09/10/2010   Sickle cell trait (HCC)    Past Surgical History:  Procedure Laterality Date   EYE SURGERY     2004   LAPAROSCOPIC OVARIAN CYSTECTOMY  04/26/2009   LAPAROTOMY N/A 06/16/2012   Procedure: EXPLORATORY LAPAROTOMY;  Surgeon: Enid Harry, MD;  Location: [facility] OR;  Service: General;  Laterality: N/A;  Repair of duodenal ulcer   REPAIR OF PERFORATED ULCER N/A 06/16/2012   Procedure: REPAIR OF PERFORATED ULCER;  Surgeon: Enid Harry, MD;  Location: [facility] OR;  Service: General;  Laterality: N/A;  duodenal   Family History: family history includes Asthma in her mother; Brain cancer in her paternal grandmother; Breast cancer in her paternal grandmother; Cancer in her paternal grandmother; Diabetes in her father and paternal grandmother; Hypertension in her father and paternal grandmother; Stroke in her mother. Social History:  reports that she has never smoked. She has never used smokeless tobacco. She reports that she does not currently use alcohol. She reports that she does not use drugs.  Maternal Diabetes: Yes:  Diabetes Type:  Insulin/Medication controlled Genetic Screening: Normal Maternal Ultrasounds/Referrals: Normal Fetal Ultrasounds or other Referrals:  Other:  :LGA Maternal Substance Abuse:  No Significant Maternal Medications:  None Significant Maternal Lab Results:  None Number of Prenatal Visits:greater than 3 verified prenatal visits  Review of Systems History   Blood pressure (!) 154/93, pulse 93, temperature 98.2 F (36.8 C), temperature source Oral, resp. rate 17, height 5\' 3"  (1.6 m), weight 122.9 kg, last menstrual period 12/17/2022, SpO2 99%, unknown if currently breastfeeding. Exam Physical Exam NAD, A&O, conversive NWOB Abd soft, nondistended, gravid  SVE closed per MAU RN   Prenatal labs: ABO, Rh: --/--/PENDING (04/26 0435) Antibody: PENDING (04/26  0435) Rubella:   RPR:    HBsAg:    HIV:    GBS:     Assessment/Plan: 33 yo [id number] presenting at 48 wga with severe SIPE. cHTN - SIPE by BP criteria and HA. Labs reassuring outside of anemia. Risks SIPE d/w pt. Continue Procardia  pp. Continue Mag x 24 hours pp.  A2GDM - poorly controlled. BS per protocol. Insulin prn. Will do FBS pp given how poorly controlled she has been and then consult DM coordinator. Suspect may need SSI pp. Pt has been resistant to getting insulin. I d/w pt the risks of poor wound healing, infection, etc.  Patient was counseled regarding ROD - we discussed VTOL and CS. She declines trying vaginally. She will only accept a primary cesarean section. Risks of cesarean section were discussed including infection, bleeding, damage to surrounding structures, poor wound healing, the need for additional procedures including hysterectomy, and the possibility of uterine rupture with neonatal morbidity/mortality, scarring, and abnormal placentation with subsequent pregnancies. I reiterated that she will likely need a wound vac given morbid obesity. She understands and would like to proceed with primary elective cesarean section. 2g ancef on call to OR. Hx augmentin  interolance (caused vaginal/rectal itching per patient).  Avoid NSAIDs given hx perforated peptic ulcer.   [name] 08/20/2023, 6:26 AM

## 2023-08-20 NOTE — Transfer of Care (Signed)
 Immediate Anesthesia Transfer of Care Note  Patient: [name]  Procedure(s) Performed: PRIMARY CESAREAN DELIVERY EDC: 09-23-23  ALLERG: AUGMENTIN , NSAIDS (Abdomen)  Patient Location: PACU  Anesthesia Type:Spinal  Level of Consciousness: awake, alert , and oriented  Airway & Oxygen Therapy: Patient Spontanous Breathing  Post-op Assessment: Report given to RN and Post -op Vital signs reviewed and stable  Post vital signs: Reviewed and stable  Last Vitals:  Vitals Value Taken Time  BP 131/64 08/20/23 0939  Temp 36.5 C 08/20/23 0939  Pulse 84 08/20/23 0944  Resp 23 08/20/23 0944  SpO2 98 % 08/20/23 0944  Vitals shown include unfiled device data.  Last Pain:  Vitals:   08/20/23 0939  TempSrc: Oral  PainSc:          Complications: No notable events documented.

## 2023-08-20 NOTE — Lactation Note (Signed)
 This note was copied from a baby's chart. Lactation Consultation Note  Patient Name: Boy Dinamarie Belrose [id number] Date: 08/20/2023 Age:32 hours   LC noted on patient's chart, that she desires to formula feed.  LC asked her RN to inquire if Mom would like to pump and she states formula feed only.  LC services complete presently.   Consult Status Consult Status: Complete    Dario Edison 08/20/2023, 1:10 PM

## 2023-08-20 NOTE — Progress Notes (Signed)
 The patient is refusing glucose checks despite my recommendation to ensure sugar is controlled for proper wound healing.

## 2023-08-20 NOTE — Anesthesia Postprocedure Evaluation (Signed)
 Anesthesia Post Note  Patient: [name]  Procedure(s) Performed: PRIMARY CESAREAN DELIVERY EDC: 09-23-23  ALLERG: AUGMENTIN , NSAIDS (Abdomen)     Patient location during evaluation: PACU Anesthesia Type: Spinal Level of consciousness: oriented and awake and alert Pain management: pain level controlled Vital Signs Assessment: post-procedure vital signs reviewed and stable Respiratory status: spontaneous breathing, respiratory function stable and nonlabored ventilation Cardiovascular status: blood pressure returned to baseline and stable Postop Assessment: no headache, no backache, no apparent nausea or vomiting, spinal receding and patient able to bend at knees Anesthetic complications: no   No notable events documented.  Last Vitals:  Vitals:   08/20/23 1026 08/20/23 1034  BP:  (!) 140/66  Pulse:  (!) 103  Resp:  20  Temp: (!) 36.1 C   SpO2:  98%    Last Pain:  Vitals:   08/20/23 1033  TempSrc:   PainSc: 6    Pain Goal:    LLE Motor Response: Non-purposeful movement (08/20/23 1034) LLE Sensation: Tingling (08/20/23 1034) RLE Motor Response: Non-purposeful movement (08/20/23 1034) RLE Sensation: Tingling (08/20/23 1034)     Epidural/Spinal Function Cutaneous sensation: Tingles (08/20/23 1034), Patient able to flex knees: Yes (08/20/23 1034), Patient able to lift hips off bed: No (08/20/23 1034), Back pain beyond tenderness at insertion site: No (08/20/23 1034), Progressively worsening motor and/or sensory loss: No (08/20/23 1034), Bowel and/or bladder incontinence post epidural: No (08/20/23 1034)  Tani Virgo A.

## 2023-08-20 NOTE — Anesthesia Preprocedure Evaluation (Addendum)
 Anesthesia Evaluation  Patient identified by MRN, date of birth, ID band Patient awake    Reviewed: Allergy & Precautions, NPO status , Patient's Chart, lab work & pertinent test results  Airway Mallampati: III  TM Distance: >3 FB Neck ROM: Full    Dental  (+) Teeth Intact, Dental Advisory Given   Pulmonary neg pulmonary ROS   Pulmonary exam normal breath sounds clear to auscultation       Cardiovascular hypertension (Pre-E), Pt. on medications Normal cardiovascular exam Rhythm:Regular Rate:Normal     Neuro/Psych negative neurological ROS  negative psych ROS   GI/Hepatic Neg liver ROS, PUD,GERD  Medicated,,  Endo/Other  diabetes, Gestational, Oral Hypoglycemic Agents  Class 3 obesity  Renal/GU negative Renal ROS     Musculoskeletal negative musculoskeletal ROS (+)    Abdominal   Peds  Hematology  (+) Blood dyscrasia, Sickle cell trait and anemia Plt 223k   Anesthesia Other Findings Day of surgery medications reviewed with the patient.  Reproductive/Obstetrics (+) Pregnancy                             Anesthesia Physical Anesthesia Plan  ASA: 3  Anesthesia Plan: Spinal   Post-op Pain Management:    Induction: Intravenous  PONV Risk Score and Plan: 2 and Scopolamine patch - Pre-op, Dexamethasone  and Ondansetron   Airway Management Planned: Natural Airway  Additional Equipment:   Intra-op Plan:   Post-operative Plan:   Informed Consent: I have reviewed the patients History and Physical, chart, labs and discussed the procedure including the risks, benefits and alternatives for the proposed anesthesia with the patient or authorized representative who has indicated his/her understanding and acceptance.     Dental advisory given  Plan Discussed with: CRNA, Anesthesiologist and Surgeon  Anesthesia Plan Comments: (Discussed risks and benefits of and differences between spinal and  general. Discussed risks of spinal including headache, backache, failure, bleeding, infection, and nerve damage. Patient consents to spinal. Questions answered. Coagulation studies and platelet count acceptable.)       Anesthesia Quick Evaluation

## 2023-08-20 NOTE — Progress Notes (Signed)
 Offered patient insulin for her blood sugar level of 152. Informed her Dr. Silvia Drummer blood sugar goal for her is below 180. She refused the insulin, since she is below her goal level.

## 2023-08-20 NOTE — MAU Provider Note (Signed)
 Chief Complaint:  Contractions   HPI     [name] is a 33 y.o. [id number] at [redacted]w[redacted]d who presents to maternity admissions reporting contractions rating them 10/10 on pain scale with HA that started today at 1500 and mild relief from Tylenol  has visual changes that started 2 weeks ago - patient reports has blurry and black spots. Denies RUQ pain. She is also c/o swollen feet that hurt.  She has cHTN and is on Procardia  30 XL daily   Denies VB, LOF and reports good FM's  Pregnancy Course: [facility]  Past Medical History:  Diagnosis Date   Anemia    Constipation    Diabetes mellitus without complication (HCC)    Gastric ulcer    Gestational diabetes    History of dysmenorrhea    Hypertension    Hypoglycemia    Ovarian cyst 2011   Postpartum care and examination 05/17/2017   Pregnancy    Seasonal allergies 09/10/2010   Sickle cell trait (HCC)    OB History  Gravida Para Term Preterm AB Living  5 2 2  0 2 2  SAB IAB Ectopic Multiple Live Births  2 0 0 0 2    # Outcome Date GA Lbr Len/2nd Weight Sex Type Anes PTL Lv  5 Current           4 Term 04/10/17    Lissa Riding LIV  3 Term 05/28/12 [redacted]w[redacted]d 36:15 / 01:15 3402 g M Vag-Spont EPI  LIV  2 SAB           1 SAB             Obstetric Comments  2018 induced for HTN, reports HTN with first also, though wasn't induced   Past Surgical History:  Procedure Laterality Date   EYE SURGERY     2004   LAPAROSCOPIC OVARIAN CYSTECTOMY  04/26/2009   LAPAROTOMY N/A 06/16/2012   Procedure: EXPLORATORY LAPAROTOMY;  Surgeon: Enid Harry, MD;  Location: [facility] OR;  Service: General;  Laterality: N/A;  Repair of duodenal ulcer   REPAIR OF PERFORATED ULCER N/A 06/16/2012   Procedure: REPAIR OF PERFORATED ULCER;  Surgeon: Enid Harry, MD;  Location: [facility] OR;  Service: General;  Laterality: N/A;  duodenal   Family History  Problem Relation Age of Onset   Asthma Mother    Stroke Mother    Diabetes Father     Hypertension Father    Brain cancer Paternal Grandmother    Breast cancer Paternal Grandmother    Cancer Paternal Grandmother    Diabetes Paternal Grandmother    Hypertension Paternal Grandmother    Anesthesia problems Neg Hx    Hearing loss Neg Hx    Social History   Tobacco Use   Smoking status: Never   Smokeless tobacco: Never  Vaping Use   Vaping status: Never Used  Substance Use Topics   Alcohol use: Not Currently    Comment: occ wine   Drug use: No   Allergies  Allergen Reactions   Nsaids     Diagnosed with a perforated duodenal ulcer due to NSAIDS   Medications Prior to Admission  Medication Sig Dispense Refill Last Dose/Taking   famotidine  (PEPCID ) 40 MG tablet Take 40 mg by mouth 2 (two) times daily.   08/19/2023   glyBURIDE  (DIABETA ) 5 MG tablet Take 1 tablet (5 mg total) by mouth daily with breakfast. 30 tablet 0 08/19/2023   NIFEdipine  (PROCARDIA -XL/NIFEDICAL-XL) 30 MG 24 hr tablet Take 1  tablet (30 mg total) by mouth daily. 30 tablet 6 08/19/2023   cyclobenzaprine  (FLEXERIL ) 10 MG tablet Take 1 tablet (10 mg total) by mouth 3 (three) times daily as needed for muscle spasms. 30 tablet 0 More than a month   cyclobenzaprine  (FLEXERIL ) 5 MG tablet Take 1 tablet (5 mg total) by mouth 3 (three) times daily as needed for muscle spasms. MAY CAUSE DROWSINESS, DO NOT OPERATE HEAVY MACHINERY AFTER TAKING THIS MEDICATION 20 tablet 0    Doxylamine -Pyridoxine  (DICLEGIS ) 10-10 MG TBEC Take 1-2 tablets by mouth at bedtime. 60 tablet 3 More than a month   oxyCODONE  (ROXICODONE ) 5 MG immediate release tablet Take 1 tablet (5 mg total) by mouth every 4 (four) hours as needed for severe pain (pain score 7-10). 30 tablet 0    polyethylene glycol (MIRALAX ) 17 g packet Take 17 g by mouth daily. 14 each 0     I have reviewed patient's Past Medical Hx, Surgical Hx, Family Hx, Social Hx, medications and allergies.   ROS  Pertinent items noted in HPI and remainder of comprehensive ROS  otherwise negative.   PHYSICAL EXAM  Patient Vitals for the past 24 hrs:  BP Temp Temp src Pulse Resp SpO2 Height Weight  08/20/23 0545 (!) 147/88 -- -- 89 -- 97 % -- --  08/20/23 0530 (!) 148/77 -- -- 94 -- 97 % -- --  08/20/23 0515 (!) 163/88 -- -- 89 -- 99 % -- --  08/20/23 0505 (!) 153/96 -- -- 85 -- 100 % -- --  08/20/23 0450 (!) 141/85 -- -- 96 -- 99 % -- --  08/20/23 0430 (!) 171/99 -- -- 82 -- 99 % -- --  08/20/23 0415 (!) 163/96 -- -- 87 -- 99 % -- --  08/20/23 0400 (!) 153/106 -- -- (!) 102 -- -- -- --  08/20/23 0343 (!) 142/94 98.3 F (36.8 C) Oral 100 14 -- 5\' 3"  (1.6 m) 122.9 kg    Constitutional: Well-developed, well-nourished female in no acute distress.  Cardiovascular: normal rate & rhythm, warm and well-perfused Respiratory: normal effort, no problems with respiration noted GI: Abd soft, non-tender, gravid MS: Extremities nontender, no edema, normal ROM Neurologic: Alert and oriented x 4.  GU: no CVA tenderness Pelvic: Patient deferred ( States she is for elective C- Section)     Fetal Tracing: Cat 2  Baseline: 140 Variability: moderate  Accelerations: present Decelerations: occasional variables Toco: ctx q 4-5   Labs: Results for orders placed or performed during the hospital encounter of 08/20/23 (from the past 24 hours)  Protein / creatinine ratio, urine     Status: None   Collection Time: 08/20/23  4:34 AM  Result Value Ref Range   Creatinine, Urine 142 mg/dL   Total Protein, Urine 17 mg/dL   Protein Creatinine Ratio 0.12 0.00 - 0.15 mg/mg[Cre]  Comprehensive metabolic panel     Status: Abnormal   Collection Time: 08/20/23  4:34 AM  Result Value Ref Range   Sodium 136 135 - 145 mmol/L   Potassium 3.4 (L) 3.5 - 5.1 mmol/L   Chloride 105 98 - 111 mmol/L   CO2 21 (L) 22 - 32 mmol/L   Glucose, Bld 122 (H) 70 - 99 mg/dL   BUN 5 (L) 6 - 20 mg/dL   Creatinine, Ser 1.61 0.44 - 1.00 mg/dL   Calcium  9.9 8.9 - 10.3 mg/dL   Total Protein 5.8 (L) 6.5 -  8.1 g/dL   Albumin 2.6 (L) 3.5 - 5.0  g/dL   AST 14 (L) 15 - 41 U/L   ALT 13 0 - 44 U/L   Alkaline Phosphatase 90 38 - 126 U/L   Total Bilirubin 0.5 0.0 - 1.2 mg/dL   GFR, Estimated >29 >52 mL/min   Anion gap 10 5 - 15  CBC     Status: Abnormal   Collection Time: 08/20/23  4:34 AM  Result Value Ref Range   WBC 10.3 4.0 - 10.5 K/uL   RBC 4.06 3.87 - 5.11 MIL/uL   Hemoglobin 9.5 (L) 12.0 - 15.0 g/dL   HCT 84.1 (L) 32.4 - 40.1 %   MCV 72.9 (L) 80.0 - 100.0 fL   MCH 23.4 (L) 26.0 - 34.0 pg   MCHC 32.1 30.0 - 36.0 g/dL   RDW 02.7 (H) 25.3 - 66.4 %   Platelets 223 150 - 400 K/uL   nRBC 0.7 (H) 0.0 - 0.2 %  Type and screen [facility]     Status: None (Preliminary result)   Collection Time: 08/20/23  4:35 AM  Result Value Ref Range   ABO/RH(D) PENDING    Antibody Screen PENDING    Sample Expiration      08/23/2023,2359 Performed at [facility], [address], [place], [place] [address]     Imaging:  No results found.  MDM & MAU COURSE  MDM:  HIGH   cHTN with SRBP's-  R/O Superimposed PreE  HELLP Labs ordered RX's for HA PreE focused protocol started with labetalol pathway  NPO - Patient has an elective C/S scheduled for LGA baby although she has had to prior NSVD x 2 NST  Discussed patient status with Dr Shoshana Dowse @ [phone] - Will treat HA and SRBP's and await labs @ [phone] Dr Shoshana Dowse called and decision was to proceed with elective C- Section for Superimposed PreE with SF - Delivery ( OR Aware)  Pre Op Orders to be placed by Dr Shoshana Dowse ( OB Attending)  MAU Course: Orders Placed This Encounter  Procedures   Urinalysis, Routine w reflex microscopic -Urine, Clean Catch   Protein / creatinine ratio, urine   Comprehensive metabolic panel   CBC   RPR   Notify physician (specify) Confirmatory reading of BP> 160/110 15 minutes later   Apply Hypertensive Disorders of Pregnancy Care Plan   Vital signs   Measure blood pressure   Ice chips   Place and  maintain sequential compression device   Strict intake and output   Measure blood pressure   Type and screen [facility]   Insert peripheral IV   Meds ordered this encounter  Medications   DISCONTD: labetalol (NORMODYNE) injection 20 mg   DISCONTD: labetalol (NORMODYNE) injection 40 mg   DISCONTD: labetalol (NORMODYNE) injection 80 mg   DISCONTD: hydrALAZINE (APRESOLINE) injection 10 mg   labetalol (NORMODYNE) 5 MG/ML injection    Laverna Pott, A: cabinet override   diphenhydrAMINE  (BENADRYL ) injection 25 mg   metoCLOPramide  (REGLAN ) injection 10 mg   acetaminophen -caffeine  (EXCEDRIN TENSION HEADACHE) 500-65 MG per tablet 2 tablet   hydrALAZINE (APRESOLINE) injection 5 mg   lactated ringers  bolus 1,000 mL   lactated ringers  infusion   sodium citrate -citric acid  (ORACIT) solution 30 mL   magnesium  bolus via infusion 4 g   magnesium  sulfate 40 grams in SWI 1000 mL OB infusion   lactated ringers  infusion   AND Linked Order Group    hydrALAZINE (APRESOLINE) injection 5 mg    hydrALAZINE (APRESOLINE) injection  10 mg    labetalol (NORMODYNE) injection 20 mg    labetalol (NORMODYNE) injection 40 mg     ASSESSMENT   1. Hypertension in pregnancy, preeclampsia, severe, antepartum, third trimester   2. [redacted] weeks gestation of pregnancy     PLAN  Admit for Delivery Prep for OR As Dw Dr Shoshana Dowse en Route  Debbe Fail, MSN, [facility], [facility]

## 2023-08-20 NOTE — Op Note (Signed)
 PROCEDURE DATE: 08/20/23   PREOPERATIVE DIAGNOSIS: severe pre-eclampsia, history third degree tear, LGA, elective   POSTOPERATIVE DIAGNOSIS: The same   PROCEDURE:    Primary Low Transverse Cesarean Section   SURGEON:  Dr. Leighton Punches  ASSISTANT: Dr. Darrow End  An experienced assistant was required given the standard of surgical care given the complexity of the case.  This assistant was needed for exposure, dissection, suctioning, retraction, instrument exchange, assisting with delivery with administration of fundal pressure, and for overall help during the procedure.    INDICATIONS: This is a 33 yo G5P2022 at 35.1 wga wga requiring cesarean section secondary to severe PIH, hx third degree tear, LGA, elective. Presented with severe PIH remote from delivery.  Decision made to proceed with LTCS. The risks of cesarean section discussed with the patient included but were not limited to: bleeding which may require transfusion or reoperation; infection which may require antibiotics; injury to bowel, bladder, ureters or other surrounding organs; injury to the fetus; need for additional procedures including hysterectomy in the event of a life-threatening hemorrhage; placental abnormalities wth subsequent pregnancies, incisional problems, thromboembolic phenomenon and other postoperative/anesthesia complications. The patient agreed with the proposed plan, giving informed consent for the procedure.     FINDINGS:  Viable female infant in vertex presentation, APGARs pending, Cord PH 7.1,  Weight pending, Amniotic fluid clear,  Intact placenta, three vessel cord.  Possible endometriosis implants on anterior surface and great deal of prominent vessels including over LUS, Grossly normal uterus otherwise .   ANESTHESIA:    Epidural ESTIMATED BLOOD LOSS: 1428cc SPECIMENS: Placenta for pathology COMPLICATIONS: None immediate   PROCEDURE IN DETAIL:  The patient received intravenous antibiotics (2g Ancef) and  had sequential compression devices applied to her lower extremities while in the preoperative area.  She was then taken to the operating room where epidural anesthesia was dosed up to surgical level and was found to be adequate. She was then placed in a dorsal supine position with a leftward tilt, and prepped and draped in a sterile manner.  A foley catheter was placed into her bladder and attached to constant gravity.  After an adequate timeout was performed, a Pfannenstiel skin incision was made with scalpel and carried through to the underlying layer of fascia. The fascia was incised in the midline and this incision was extended bilaterally using the Mayo scissors. Kocher clamps were applied to the superior aspect of the fascial incision and the underlying rectus muscles were dissected off bluntly. A similar process was carried out on the inferior aspect of the facial incision. The rectus muscles were separated in the midline bluntly and the peritoneum was entered bluntly.  A bladder flap was created sharply and developed bluntly. A transverse hysterotomy was made with a scalpel and extended bilaterally bluntly with attempts to avoid multiple large vessels coursing through-out LUS and below AND above it. The bladder blade was then removed. The infant was successfully delivered, and cord was clamped and cut and infant was handed over to awaiting neonatology team. Uterine massage was then administered and the placenta delivered intact with three-vessel cord. Cord gases were taken. The uterus was cleared of clot and debris.  The hysterotomy was closed with 0 vicryl.  A second imbricating suture of 0-vicryl was used to reinforce the incision and aid in hemostasis. 4 figure of eight stitches had to be placed in order to control bleeding from multiple areas. However, ultimately great hemostasis was achieved. Arista placed. The fascia was closed with 0-Vicryl  in a running fashion with good restoration of anatomy.  The  subcutaneus tissue was irrigated and was reapproximated using three interrupted plain gut stitches.  The skin was closed with 4-0 Vicryl in a subcuticular fashion. A wound vac was placed - history of uncontrolled GDM and at risk for poor wound healing.     All surgical site and was hemostatic at end of procedure) without any further bleeding on exam.   It's a boy - "Giovani"!     BS was 120-130s. Goal of < 200 which currently is fine.  Keep Mag x 24 hours pp.  Keep Procardia  30xL for now.  CBC in PACU - suspect will need at least IV Fe given starting Hgb and EBL of 1428cc.    Pt tolerated the procedure well. All sponge/lap/needle counts were correct  X 2. Pt taken to recovery room in stable condition.     Leighton Punches MD

## 2023-08-21 LAB — GLUCOSE, CAPILLARY
Glucose-Capillary: 172 mg/dL — ABNORMAL HIGH (ref 70–99)
Glucose-Capillary: 104 mg/dL — ABNORMAL HIGH (ref 70–99)

## 2023-08-21 LAB — CBC
HCT: 25.3 % — ABNORMAL LOW (ref 36.0–46.0)
Hemoglobin: 8.1 g/dL — ABNORMAL LOW (ref 12.0–15.0)
MCH: 23.3 pg — ABNORMAL LOW (ref 26.0–34.0)
MCHC: 32 g/dL (ref 30.0–36.0)
MCV: 72.9 fL — ABNORMAL LOW (ref 80.0–100.0)
Platelets: 226 10*3/uL (ref 150–400)
RBC: 3.47 MIL/uL — ABNORMAL LOW (ref 3.87–5.11)
RDW: 16.2 % — ABNORMAL HIGH (ref 11.5–15.5)
WBC: 10.2 10*3/uL (ref 4.0–10.5)
nRBC: 0.4 % — ABNORMAL HIGH (ref 0.0–0.2)

## 2023-08-21 MED ORDER — MUPIROCIN CALCIUM 2 % EX CREA
TOPICAL_CREAM | Freq: Three times a day (TID) | CUTANEOUS | Status: DC
  Administered 2023-08-25: 1 via TOPICAL
  Filled 2023-08-21: qty 15

## 2023-08-21 MED ORDER — BENZOCAINE-MENTHOL 20-0.5 % EX AERO
1.0000 | INHALATION_SPRAY | Freq: Four times a day (QID) | CUTANEOUS | Status: DC | PRN
Start: 2023-08-21 — End: 2023-08-26
  Filled 2023-08-21: qty 56

## 2023-08-21 MED ORDER — NIFEDIPINE ER OSMOTIC RELEASE 60 MG PO TB24
60.0000 mg | ORAL_TABLET | Freq: Every day | ORAL | Status: DC
  Administered 2023-08-21 – 2023-08-24 (×4): 60 mg via ORAL
  Filled 2023-08-21 (×4): qty 1

## 2023-08-21 NOTE — Progress Notes (Signed)
 Subjective: Postpartum Day 1: Cesarean Delivery s/s severe PIH.  Patient reports tolerating PO and no problems voiding.  Denies CP, SOB, dizziness, etc. Ambulating without issues.   Objective: Vital signs in last 24 hours: Temp:  [96.9 F (36.1 C)-98.1 F (36.7 C)] 98.1 F (36.7 C) (04/27 0749) Pulse Rate:  [81-111] 111 (04/27 0749) Resp:  [16-30] 18 (04/27 0749) BP: (127-167)/(64-95) 153/79 (04/27 0749) SpO2:  [96 %-99 %] 98 % (04/27 0749)  Physical Exam:  General: alert and cooperative Lochia: appropriate Uterine Fundus: firm Incision: healing well, no significant drainage, wound vac in place DVT Evaluation: No evidence of DVT seen on physical exam.  Recent Labs    08/20/23 1028 08/21/23 0535  HGB 8.9* 8.1*  HCT 27.6* 25.3*    Assessment/Plan: Status post Cesarean section at 35 wga c/b PPH. Total EBL 1603cc. Acute blood loss anemia - stable. D/w pt options. Elects PO Fe in PNV. Denies s/s anemia. SIPE (severe) - Mag turned off at 24 hour mark. Will increase procardia  to 60mg . GDM - uncontrolled. Understands risks of poor wound wealing, etc. She accepted a BS this am after we spoke and it was elevated to 172 (technically was a 2 hour pp and not GBS bc she refused a fbs). We discussed SSI and she is amenable to this. Will order pre meal SSI and BS checks.  DM coordinator consult in place - awaiting their recs.  Wound vac in place.  Defer circ until baby is ready - currently he is stable in the NICU but needing oxygen, etc.    Sherry Deck, MD 08/21/2023, 8:15 AM

## 2023-08-21 NOTE — Plan of Care (Signed)
 Problem: Education: Goal: Knowledge of disease or condition will improve Outcome: Progressing Goal: Knowledge of the prescribed therapeutic regimen will improve Outcome: Progressing   Problem: Fluid Volume: Goal: Peripheral tissue perfusion will improve Outcome: Progressing   Problem: Clinical Measurements: Goal: Complications related to disease process, condition or treatment will be avoided or minimized Outcome: Progressing   Problem: Education: Goal: Knowledge of General Education information will improve Description: Including pain rating scale, medication(s)/side effects and non-pharmacologic comfort measures Outcome: Progressing   Problem: Health Behavior/Discharge Planning: Goal: Ability to manage health-related needs will improve Outcome: Progressing   Problem: Clinical Measurements: Goal: Ability to maintain clinical measurements within normal limits will improve Outcome: Progressing Goal: Will remain free from infection Outcome: Progressing Goal: Diagnostic test results will improve Outcome: Progressing Goal: Respiratory complications will improve Outcome: Progressing Goal: Cardiovascular complication will be avoided Outcome: Progressing   Problem: Activity: Goal: Risk for activity intolerance will decrease Outcome: Progressing   Problem: Nutrition: Goal: Adequate nutrition will be maintained Outcome: Progressing   Problem: Coping: Goal: Level of anxiety will decrease Outcome: Progressing   Problem: Elimination: Goal: Will not experience complications related to bowel motility Outcome: Progressing Goal: Will not experience complications related to urinary retention Outcome: Progressing   Problem: Pain Managment: Goal: General experience of comfort will improve and/or be controlled Outcome: Progressing   Problem: Safety: Goal: Ability to remain free from injury will improve Outcome: Progressing   Problem: Skin Integrity: Goal: Risk for impaired  skin integrity will decrease Outcome: Progressing   Problem: Education: Goal: Knowledge of the prescribed therapeutic regimen will improve Outcome: Progressing Goal: Understanding of sexual limitations or changes related to disease process or condition will improve Outcome: Progressing Goal: Individualized Educational Video(s) Outcome: Progressing   Problem: Self-Concept: Goal: Communication of feelings regarding changes in body function or appearance will improve Outcome: Progressing   Problem: Skin Integrity: Goal: Demonstration of wound healing without infection will improve Outcome: Progressing   Problem: Education: Goal: Knowledge of condition will improve Outcome: Progressing Goal: Individualized Educational Video(s) Outcome: Progressing Goal: Individualized Newborn Educational Video(s) Outcome: Progressing   Problem: Activity: Goal: Will verbalize the importance of balancing activity with adequate rest periods Outcome: Progressing Goal: Ability to tolerate increased activity will improve Outcome: Progressing   Problem: Coping: Goal: Ability to identify and utilize available resources and services will improve Outcome: Progressing   Problem: Life Cycle: Goal: Chance of risk for complications during the postpartum period will decrease Outcome: Progressing   Problem: Role Relationship: Goal: Ability to demonstrate positive interaction with newborn will improve Outcome: Progressing   Problem: Skin Integrity: Goal: Demonstration of wound healing without infection will improve Outcome: Progressing   Problem: Education: Goal: Ability to describe self-care measures that may prevent or decrease complications (Diabetes Survival Skills Education) will improve Outcome: Progressing Goal: Individualized Educational Video(s) Outcome: Progressing   Problem: Coping: Goal: Ability to adjust to condition or change in health will improve Outcome: Progressing   Problem:  Education: Goal: Knowledge of condition will improve Outcome: Progressing Goal: Individualized Educational Video(s) Outcome: Progressing Goal: Individualized Newborn Educational Video(s) Outcome: Progressing   Problem: Activity: Goal: Will verbalize the importance of balancing activity with adequate rest periods Outcome: Progressing Goal: Ability to tolerate increased activity will improve Outcome: Progressing   Problem: Coping: Goal: Ability to identify and utilize available resources and services will improve Outcome: Progressing   Problem: Life Cycle: Goal: Chance of risk for complications during the postpartum period will decrease Outcome: Progressing   Problem: Role Relationship: Goal: Ability  to demonstrate positive interaction with newborn will improve Outcome: Progressing   Problem: Skin Integrity: Goal: Demonstration of wound healing without infection will improve Outcome: Progressing

## 2023-08-22 ENCOUNTER — Encounter (HOSPITAL_COMMUNITY): Payer: Self-pay | Admitting: Obstetrics and Gynecology

## 2023-08-22 ENCOUNTER — Ambulatory Visit

## 2023-08-22 ENCOUNTER — Other Ambulatory Visit

## 2023-08-22 LAB — GLUCOSE, CAPILLARY
Glucose-Capillary: 93 mg/dL (ref 70–99)
Glucose-Capillary: 81 mg/dL (ref 70–99)

## 2023-08-22 NOTE — Progress Notes (Signed)
 Postpartum Progress Note  Postpartum Day 2 s/p primary Cesarean section, 3.5 weeks, severe preeclampsia, poorly controlled GDM  Subjective:  Patient reports no overnight events.  She reports well controlled pain, ambulating without difficulty, voiding spontaneously, tolerating PO.  She reports Positive flatus, Negative BM.  Vaginal bleeding is minimal.  She denies HA, vision changes, RUQ pain.   Objective: Blood pressure 134/64, pulse (!) 106, temperature 98.4 F (36.9 C), temperature source Oral, resp. rate 18, height 5\' 3"  (1.6 m), weight 122.9 kg, last menstrual period 12/17/2022, SpO2 98%, unknown if currently breastfeeding.  Physical Exam:  General: alert and no distress Lochia: appropriate Abdomen: soft, ATTP Uterine Fundus: firm Incision: Wound vac in place DVT Evaluation: No evidence of DVT seen on physical exam.  Recent Labs    08/20/23 1028 08/21/23 0535  HGB 8.9* 8.1*  HCT 27.6* 25.3*    Assessment/Plan: Postpartum Day 2, s/p C-section Preeclampsia with severe features s/p 24 hrs PP magnesium . UO adequate BP controlled improved, continue procardia  XL 60 mg daily No s/s PIH. GDM - SSI added, discussed importance of glycemic control in regards to wound healing. Appreciate DM coordinator recs. BG with some improvement Acute blood loss anemia, clinically significant - Fe/Colace. No signs or symptoms of anemia.  Lactation following Baby boy in NICU Doing well this AM, continue routine postpartum care. Anticipate discharge tomorrow   LOS: 2 days   Gretchen Leavell 08/22/2023, 7:27 AM

## 2023-08-22 NOTE — Social Work (Signed)
 Patient screened out for psychosocial assessment since none of the following apply: Psychosocial stressors documented in mother or baby's chart Gestation less than 32 weeks Code at delivery  Infant with anomalies Please contact the Clinical Social Worker if specific needs arise, by MOB's request, or if MOB scores greater than 9/yes to question 10 on Edinburgh Postpartum Depression Screen.  Nickolas Barr, MSW, LCSW Clinical Social Worker  [phone] 08/22/2023  11:22 AM

## 2023-08-22 NOTE — Inpatient Diabetes Management (Signed)
 Inpatient Diabetes Program Recommendations  AACE/ADA: New Consensus Statement on Inpatient Glycemic Control (2015)  Target Ranges:  Prepandial:   less than 140 mg/dL      Peak postprandial:   less than 180 mg/dL (1-2 hours)      Critically ill patients:  140 - 180 mg/dL   Lab Results  Component Value Date   GLUCAP 93 08/22/2023   HGBA1C 6.5 (H) 08/20/2023    Review of Glycemic Control  Diabetes history: A2GDM Outpatient Diabetes medications: Glyburide  5 mg Daily Current orders for Inpatient glycemic control:  Novolog 0-20 units tid  A1c 6.5% on 4/26  Pt has family history of DM. Pt reports only having a history of GDM has never had prediabetes. Although with each pregnancy her levels normalized. This pregnancy glucose trends stayed elevated and required one dose of insulin. Pt reports eating a salad and chips last night, note regular ginger ale at bedside, Fasting glucose 90's without intervention of insulin since yesterday am for a glucose in the 170's. Will watch glucose for the next 24 hours and see if she requires an insulin dose. If elevated may start metformin 500 mg daily. I stressed importance of continuing lifestyle modifications needed to control glucose levels postpartum. Encouraged glucose checks bid before meals when discharged whether she will require oral meds or not to watch her glucose levels.   Will reevaluate in the am.  Thanks, Eloise Hake RN, MSN, BC-ADM Inpatient Diabetes Coordinator Team Pager [phone] (8a-5p)

## 2023-08-23 LAB — AEROBIC CULTURE W GRAM STAIN (SUPERFICIAL SPECIMEN)
Gram Stain: NONE SEEN
Special Requests: NORMAL

## 2023-08-23 LAB — GLUCOSE, CAPILLARY
Glucose-Capillary: 95 mg/dL (ref 70–99)
Glucose-Capillary: 84 mg/dL (ref 70–99)
Glucose-Capillary: 99 mg/dL (ref 70–99)

## 2023-08-23 LAB — SURGICAL PATHOLOGY

## 2023-08-23 MED ORDER — METHOCARBAMOL 500 MG PO TABS
500.0000 mg | ORAL_TABLET | Freq: Four times a day (QID) | ORAL | Status: DC | PRN
  Administered 2023-08-24 (×2): 500 mg via ORAL
  Filled 2023-08-23 (×4): qty 1

## 2023-08-23 MED ORDER — BISACODYL 10 MG RE SUPP
10.0000 mg | Freq: Every day | RECTAL | Status: DC | PRN
  Administered 2023-08-23: 10 mg via RECTAL
  Filled 2023-08-23: qty 1

## 2023-08-23 NOTE — Progress Notes (Signed)
 POD #3  Tolerating regular diet, scant flatus and no BM yet Up to BR but has a lot of pain with ambulation  Today's Vitals   08/23/23 0313 08/23/23 0314 08/23/23 0429 08/23/23 0758  BP:   (!) 145/71 137/82  Pulse:   96 88  Resp:   16 17  Temp:   98.7 F (37.1 C) 98.7 F (37.1 C)  TempSrc:   Oral Oral  SpO2:   97% 97%  Weight:      Height:      PainSc: 7  7  Asleep    Body mass index is 47.99 kg/m.   Fasting glucose today 93  Abdomen soft, wound vac in place  A/P: Postpartum Day 3, s/p C-section Preeclampsia with severe features s/p 24 hrs PP magnesium . UO adequate BP controlled improved, continue procardia  XL 60 mg daily No s/s PIH. GDM - SSI, FBS today 93. Per diabetes coordinator recommendation, will not start metformin and will continue to follow glucose Acute blood loss anemia, clinically significant - Fe/Colace. No signs or symptoms of anemia.  Lactation following Baby boy in NICU Doing well this AM, continue routine postpartum care.  D/W discharge. She does not feel her pain control is adequate. She is not breast feeding. Will order Robaxin prn. She is also concerned that she has not yet had a BM. Will order Dulcolax suppository. D/W patient. She states she understands and agrees.

## 2023-08-23 NOTE — Inpatient Diabetes Management (Signed)
 Inpatient Diabetes Program Recommendations  AACE/ADA: New Consensus Statement on Inpatient Glycemic Control (2015)  Target Ranges:  Prepandial:   less than 140 mg/dL      Peak postprandial:   less than 180 mg/dL (1-2 hours)      Critically ill patients:  140 - 180 mg/dL   Lab Results  Component Value Date   GLUCAP 95 08/23/2023   HGBA1C 6.5 (H) 08/20/2023    Review of Glycemic Control  Latest Reference Range & Units 08/21/23 13:23 08/22/23 06:37 08/22/23 17:35 08/23/23 09:23  Glucose-Capillary 70 - 99 mg/dL 952 (H) 93 81 95   Diabetes history: A2GDM Outpatient Diabetes medications: Glyburide  5 mg Daily Current orders for Inpatient glycemic control:  Novolog 0-20 units tid  A1c 6.5% on 4/26  Glucose this am 95. Will not start metformin at this time. Spoke to patient and recommend her to check her glucose periodically and follow up at her 6 week postpartum appointment and to continually get her A1c checked with a PCP.  Thanks, Eloise Hake RN, MSN, BC-ADM Inpatient Diabetes Coordinator Team Pager [phone] (8a-5p)

## 2023-08-24 LAB — CBC
HCT: 24.6 % — ABNORMAL LOW (ref 36.0–46.0)
Hemoglobin: 7.8 g/dL — ABNORMAL LOW (ref 12.0–15.0)
MCH: 23.2 pg — ABNORMAL LOW (ref 26.0–34.0)
MCHC: 31.7 g/dL (ref 30.0–36.0)
MCV: 73.2 fL — ABNORMAL LOW (ref 80.0–100.0)
Platelets: 303 10*3/uL (ref 150–400)
RBC: 3.36 MIL/uL — ABNORMAL LOW (ref 3.87–5.11)
RDW: 16.7 % — ABNORMAL HIGH (ref 11.5–15.5)
WBC: 9.5 10*3/uL (ref 4.0–10.5)
nRBC: 0.6 % — ABNORMAL HIGH (ref 0.0–0.2)

## 2023-08-24 LAB — GLUCOSE, CAPILLARY
Glucose-Capillary: 102 mg/dL — ABNORMAL HIGH (ref 70–99)
Glucose-Capillary: 107 mg/dL — ABNORMAL HIGH (ref 70–99)
Glucose-Capillary: 98 mg/dL (ref 70–99)

## 2023-08-24 MED ORDER — NIFEDIPINE ER OSMOTIC RELEASE 60 MG PO TB24: 60.0000 mg | ORAL_TABLET | Freq: Two times a day (BID) | ORAL | Status: DC

## 2023-08-24 MED ORDER — LABETALOL HCL 200 MG PO TABS
200.0000 mg | ORAL_TABLET | Freq: Three times a day (TID) | ORAL | Status: DC
  Administered 2023-08-24 – 2023-08-25 (×2): 200 mg via ORAL
  Filled 2023-08-24 (×2): qty 1

## 2023-08-24 MED ORDER — MAGNESIUM SULFATE BOLUS VIA INFUSION
6.0000 g | Freq: Once | INTRAVENOUS | Status: AC
  Administered 2023-08-24: 6 g via INTRAVENOUS
  Filled 2023-08-24: qty 1000

## 2023-08-24 MED ORDER — MAGNESIUM SULFATE 40 GM/1000ML IV SOLN
2.0000 g/h | INTRAVENOUS | Status: DC
  Administered 2023-08-24 – 2023-08-25 (×2): 2 g/h via INTRAVENOUS
  Filled 2023-08-24 (×2): qty 1000

## 2023-08-24 MED ORDER — SODIUM CHLORIDE 0.9 % IV SOLN: INTRAVENOUS | Status: AC

## 2023-08-24 NOTE — Progress Notes (Signed)
 In to see patient and check on status.  She reports HA is dramatically improved after starting magnesium .  No vision change, RUQ pain, CP/SOB.  Mild nausea after eating a sandwich and soup; declines medication given how mild the nausea is.  Incision pain is tolerable.  Patient declines soap suds enema while on magnesium .       08/24/2023    6:49 PM 08/24/2023    6:09 PM 08/24/2023    5:12 PM  Vitals with BMI  Systolic 151 151 295  Diastolic 74 85 79  Pulse 114 107 127   Given continued mild range BPs, will start labetalol 200 TID.   Continue to closely monitor.  Dyanna Glasgow, DO

## 2023-08-24 NOTE — Progress Notes (Signed)
 50 Dr. Yehuda Helms notified of elevated BP overnight. Requests 10:00 am 60 mg NIFEdipine  (PROCARDIA  XL/NIFEDICAL XL) to be given now - see MAR.   Dr. Cipriano Creeks to follow-up during daytime. To call back for any additional concerns.   Pt asymptomatic with exception of headache (which pt describes more as neck and shoulder pain that effects the left side of her head).

## 2023-08-24 NOTE — Progress Notes (Signed)
   08/24/23 0412 08/24/23 0433  Vital Signs  BP (!) 160/86 (!) 146/81   Severe range BP with improved systolic value on recheck. Pt with on-going head, neck, and shoulder pain - see MAR. No other s/s of PIH. Pt left in room resting, in no acute distress. Recheck in 4 hrs or sooner PRN.

## 2023-08-24 NOTE — Progress Notes (Addendum)
 Late entry Postpartum Note  Subjective: Postpartum Day 4: Cesarean Delivery Patient reports resting well/sleeping late this am and is now in NICU with newborn; patient was able to ambulate to NIC.  Patient reports incisional pain, tolerating PO, + flatus, and no problems voiding.  Patient c/o severe (10/10) HA which is not improved on Tylenol , Dilaudid , Robaxin, or Oxycocone; unable to take NSAIDs due to h/o perforated duodenal ulcer.  Patient also c/o gas pain; abdominal pain which moves around in location and positionally worsens.  She passed small, hard stool yesterday but no more.  Patient also c/o right incision pain; not as bad as HA.  Worse with ambulation.  No CP/SOB, RUQ pain, or visual disturbance.  Objective: Vital signs in last 24 hours: Temp:  [98.1 F (36.7 C)-98.2 F (36.8 C)] 98.2 F (36.8 C) (04/30 0902) Pulse Rate:  [86-102] 99 (04/30 0902) Resp:  [17-19] 18 (04/30 0902) BP: (135-160)/(67-86) 141/67 (04/30 0902) SpO2:  [97 %-100 %] 97 % (04/30 0902)  Physical Exam:  General: alert, cooperative, and appears stated age Lochia: appropriate Uterine Fundus: firm Incision: wound vac in place with good seal and no skin erythema visible.   DVT Evaluation: No evidence of DVT seen on physical exam. Negative Homan's sign. No cords or calf tenderness.  Recent Labs    08/24/23 0839  HGB 7.8*  HCT 24.6*   CBG (last 3)  Recent Labs    08/23/23 1724 08/23/23 2241 08/24/23 0858  GLUCAP 84 99 102*    Assessment/Plan: POD4 Status post Primary elective Cesarean section for SIPE at 35 weeks, A2DM.   -Superimposed Pre-E.  S/P magnesium  recovery x 24 h postop.  Currently Procardia  60XL every day.  Given severe HA unresponsive to pain medication and elevated BPs, I counseled patient that I recommend repeat magnesium  sulfate for seizure ppx and she agrees.  Will d/c Procardia  during magnesium  infusion and use po labetalol PRN severe range BPs.  Labs ordered tomorrow  AM. -A2DM-All CBGs <120 and has not required SSI x 3 days.  Insulin orders discontinued.  Will continue to monitor CBGs. -Constipation-soap suds enema ordered -Acute blood loss anemia, clinically significant - Fe/Colace. No signs or symptoms of anemia -Continue routine PP care  Dyanna Glasgow, DO 08/24/2023, 1:02 PM

## 2023-08-24 NOTE — Progress Notes (Signed)
 OxyCODONE  (Oxy IR/ROXICODONE ) immediate release 5 mg given at 2241 prior to handoff. During introduction and pain reassessment rounding, pt reports that pain medication was not effective complaining of incisional pain, pressure, and soreness 10/10 that worsens with activity.   At 2355 0.6 mg of HYDROmorphone  (DILAUDID ) given IV push; for reassessment pt resting with adequate rise and fall of chest - appears comfortable.   2 hrs after 1st dose of Dilaudid , pt reports pain is back at 10/10, but this time complains of left-sided headache stemming from left shoulder and neck pain. Previous dose repeated per pt request as "helpful, but not long-lasting."   0400 1,000 mg acetaminophen  (TYLENOL ) dose given with PRN Simethicone  and PRN Methocarbamol (ROBAXIN) along with writer performing shoulder massage with heat packs.  Pt encouraged to ambulate more frequently, continue using heat packs and let nurse know if there are any additional concerns. Pt left in room in no acute distress.   Chenell Lozon, RN 08/24/23

## 2023-08-25 DIAGNOSIS — L989 Disorder of the skin and subcutaneous tissue, unspecified: Secondary | ICD-10-CM

## 2023-08-25 DIAGNOSIS — Z22358 Carrier of other enterobacterales: Secondary | ICD-10-CM

## 2023-08-25 DIAGNOSIS — B9620 Unspecified Escherichia coli [E. coli] as the cause of diseases classified elsewhere: Secondary | ICD-10-CM

## 2023-08-25 LAB — COMPREHENSIVE METABOLIC PANEL WITH GFR
ALT: 44 U/L (ref 0–44)
AST: 35 U/L (ref 15–41)
Albumin: 2.5 g/dL — ABNORMAL LOW (ref 3.5–5.0)
Alkaline Phosphatase: 73 U/L (ref 38–126)
Anion gap: 11 (ref 5–15)
BUN: 6 mg/dL (ref 6–20)
CO2: 23 mmol/L (ref 22–32)
Calcium: 7.6 mg/dL — ABNORMAL LOW (ref 8.9–10.3)
Chloride: 102 mmol/L (ref 98–111)
Creatinine, Ser: 0.88 mg/dL (ref 0.44–1.00)
GFR, Estimated: >60 mL/min (ref >60)
Glucose, Bld: 152 mg/dL — ABNORMAL HIGH (ref 70–99)
Potassium: 3.4 mmol/L — ABNORMAL LOW (ref 3.5–5.1)
Sodium: 136 mmol/L (ref 135–145)
Total Bilirubin: 0.4 mg/dL (ref 0.0–1.2)
Total Protein: 5.5 g/dL — ABNORMAL LOW (ref 6.5–8.1)

## 2023-08-25 LAB — CBC
HCT: 24.5 % — ABNORMAL LOW (ref 36.0–46.0)
Hemoglobin: 7.8 g/dL — ABNORMAL LOW (ref 12.0–15.0)
MCH: 23.1 pg — ABNORMAL LOW (ref 26.0–34.0)
MCHC: 31.8 g/dL (ref 30.0–36.0)
MCV: 72.7 fL — ABNORMAL LOW (ref 80.0–100.0)
Platelets: 331 10*3/uL (ref 150–400)
RBC: 3.37 MIL/uL — ABNORMAL LOW (ref 3.87–5.11)
RDW: 17.1 % — ABNORMAL HIGH (ref 11.5–15.5)
WBC: 9.8 10*3/uL (ref 4.0–10.5)
nRBC: 0.3 % — ABNORMAL HIGH (ref 0.0–0.2)

## 2023-08-25 MED ORDER — LABETALOL HCL 200 MG PO TABS
400.0000 mg | ORAL_TABLET | Freq: Three times a day (TID) | ORAL | Status: DC
  Administered 2023-08-25 – 2023-08-26 (×3): 400 mg via ORAL
  Filled 2023-08-25 (×3): qty 2

## 2023-08-25 NOTE — Consult Note (Signed)
 [facility]  Total days of antibiotics 1 Reason for Consult:esbl kleb SSTI and isolation concerns    Referring Physician: Delva Derden  Principal Problem:   Pregnant    HPI: [name] is a 33 y.o. female with hx of T2DM, gestational diabetes, who PPD#5 s/p csection with her newborn son in the nicu due to respiratory distress. Now in [place] in NICU on suplemental oxygen.  She reports that she had an ingrown hair inner thigh/buttocks --on April 24th that drained on its own. While she was hospitalized, she noticed some irritation to that area when urine touched the area. She states it is healing well.  On 4/27, a superficial swab grew ESBL Ecoli.   ID team asked to weigh on treatment and isolation needs  Past Medical History:  Diagnosis Date   Anemia    Constipation    Diabetes mellitus without complication (HCC)    Gastric ulcer    Gestational diabetes    History of dysmenorrhea    Hypertension    Hypoglycemia    Ovarian cyst 2011   Postpartum care and examination 05/17/2017   Pregnancy    Seasonal allergies 09/10/2010   Sickle cell trait (HCC)     Allergies:  Allergies  Allergen Reactions   Nsaids     Diagnosed with a perforated duodenal ulcer due to NSAIDS    Current antibiotics:   MEDICATIONS:  acetaminophen   1,000 mg Oral Q6H   famotidine   40 mg Oral BID   labetalol   400 mg Oral TID   mupirocin  cream   Topical TID   prenatal multivitamin  1 tablet Oral Q1200   scopolamine   1 patch Transdermal Once   senna-docusate  2 tablet Oral Daily   simethicone   80 mg Oral TID PC    Social History   Tobacco Use   Smoking status: Never   Smokeless tobacco: Never  Vaping Use   Vaping status: Never Used  Substance Use Topics   Alcohol use: Not Currently    Comment: occ wine   Drug use: No    Family History  Problem Relation Age of Onset   Asthma Mother    Stroke Mother    Diabetes Father    Hypertension Father    Brain  cancer Paternal Grandmother    Breast cancer Paternal Grandmother    Cancer Paternal Grandmother    Diabetes Paternal Grandmother    Hypertension Paternal Grandmother    Anesthesia problems Neg Hx    Hearing loss Neg Hx      Review of Systems  Constitutional: Negative for fever, chills, diaphoresis, activity change, appetite change, fatigue and unexpected weight change.  HENT: Negative for congestion, sore throat, rhinorrhea, sneezing, trouble swallowing and sinus pressure.  Eyes: Negative for photophobia and visual disturbance.  Respiratory: Negative for cough, chest tightness, shortness of breath, wheezing and stridor.  Cardiovascular: Negative for chest pain, palpitations and leg swelling.  Gastrointestinal: Negative for nausea, vomiting, abdominal pain, diarrhea, constipation, blood in stool, abdominal distention and anal bleeding.  Genitourinary: Negative for dysuria, hematuria, flank pain and difficulty urinating.  Musculoskeletal: Negative for myalgias, back pain, joint swelling, arthralgias and gait problem.  Skin: Negative for color change, pallor, rash and wound.  Neurological: Negative for dizziness, tremors, weakness and light-headedness.  Hematological: Negative for adenopathy. Does not bruise/bleed easily.  Psychiatric/Behavioral: Negative for behavioral problems, confusion, sleep disturbance, dysphoric mood, decreased concentration and agitation.    OBJECTIVE: Temp:  [97.7 F (36.5 C)-98.4 F (  36.9 C)] 97.7 F (36.5 C) (05/01 1124) Pulse Rate:  [86-127] 86 (05/01 1124) Resp:  [16-18] 16 (05/01 1233) BP: (135-155)/(67-88) 135/84 (05/01 1124) SpO2:  [94 %-99 %] 99 % (05/01 1124) Physical Exam  Constitutional:  oriented to person, place, and time. appears well-developed and well-nourished. No distress.  HENT: [place]/AT, PERRLA, no scleral icterus Mouth/Throat: Oropharynx is clear and moist. No oropharyngeal exudate.  Cardiovascular: Normal rate, regular rhythm and normal  heart sounds. Exam reveals no gallop and no friction rub.  No murmur heard.  Pulmonary/Chest: Effort normal and breath sounds normal. No respiratory distress.  has no wheezes.  Skin = shallow subcm lesion. Pink wound bed. Covered  Psychiatric: a normal mood and affect.  behavior is normal.    LABS: Results for orders placed or performed during the hospital encounter of 08/20/23 (from the past 48 hours)  Glucose, capillary     Status: None   Collection Time: 08/23/23  5:24 PM  Result Value Ref Range   Glucose-Capillary 84 70 - 99 mg/dL    Comment: Glucose reference range applies only to samples taken after fasting for at least 8 hours.  Glucose, capillary     Status: None   Collection Time: 08/23/23 10:41 PM  Result Value Ref Range   Glucose-Capillary 99 70 - 99 mg/dL    Comment: Glucose reference range applies only to samples taken after fasting for at least 8 hours.  CBC     Status: Abnormal   Collection Time: 08/24/23  8:39 AM  Result Value Ref Range   WBC 9.5 4.0 - 10.5 K/uL   RBC 3.36 (L) 3.87 - 5.11 MIL/uL   Hemoglobin 7.8 (L) 12.0 - 15.0 g/dL    Comment: Reticulocyte Hemoglobin testing may be clinically indicated, consider ordering this additional test [id number]    HCT 24.6 (L) 36.0 - 46.0 %   MCV 73.2 (L) 80.0 - 100.0 fL   MCH 23.2 (L) 26.0 - 34.0 pg   MCHC 31.7 30.0 - 36.0 g/dL   RDW 54.0 (H) 98.1 - 19.1 %   Platelets 303 150 - 400 K/uL   nRBC 0.6 (H) 0.0 - 0.2 %    Comment: Performed at [facility], [address] [place], [place] [address]  Glucose, capillary     Status: Abnormal   Collection Time: 08/24/23  8:58 AM  Result Value Ref Range   Glucose-Capillary 102 (H) 70 - 99 mg/dL    Comment: Glucose reference range applies only to samples taken after fasting for at least 8 hours.  Glucose, capillary     Status: Abnormal   Collection Time: 08/24/23  4:39 PM  Result Value Ref Range   Glucose-Capillary 107 (H) 70 - 99 mg/dL    Comment: Glucose reference  range applies only to samples taken after fasting for at least 8 hours.  Glucose, capillary     Status: None   Collection Time: 08/24/23 11:06 PM  Result Value Ref Range   Glucose-Capillary 98 70 - 99 mg/dL    Comment: Glucose reference range applies only to samples taken after fasting for at least 8 hours.  CBC     Status: Abnormal   Collection Time: 08/25/23  4:57 AM  Result Value Ref Range   WBC 9.8 4.0 - 10.5 K/uL   RBC 3.37 (L) 3.87 - 5.11 MIL/uL   Hemoglobin 7.8 (L) 12.0 - 15.0 g/dL    Comment: Reticulocyte Hemoglobin testing may be clinically indicated, consider ordering this additional test  [id number]    HCT 24.5 (L) 36.0 - 46.0 %   MCV 72.7 (L) 80.0 - 100.0 fL   MCH 23.1 (L) 26.0 - 34.0 pg   MCHC 31.8 30.0 - 36.0 g/dL   RDW 54.0 (H) 98.1 - 19.1 %   Platelets 331 150 - 400 K/uL   nRBC 0.3 (H) 0.0 - 0.2 %    Comment: Performed at [facility], [address] [place], [place] [address]  Comprehensive metabolic panel     Status: Abnormal   Collection Time: 08/25/23  4:57 AM  Result Value Ref Range   Sodium 136 135 - 145 mmol/L   Potassium 3.4 (L) 3.5 - 5.1 mmol/L   Chloride 102 98 - 111 mmol/L   CO2 23 22 - 32 mmol/L   Glucose, Bld 152 (H) 70 - 99 mg/dL    Comment: Glucose reference range applies only to samples taken after fasting for at least 8 hours.   BUN 6 6 - 20 mg/dL   Creatinine, Ser 5.62 0.44 - 1.00 mg/dL   Calcium  7.6 (L) 8.9 - 10.3 mg/dL   Total Protein 5.5 (L) 6.5 - 8.1 g/dL   Albumin 2.5 (L) 3.5 - 5.0 g/dL   AST 35 15 - 41 U/L   ALT 44 0 - 44 U/L   Alkaline Phosphatase 73 38 - 126 U/L   Total Bilirubin 0.4 0.0 - 1.2 mg/dL   GFR, Estimated >13 >08 mL/min    Comment: (NOTE) Calculated using the CKD-EPI Creatinine Equation (2021)    Anion gap 11 5 - 15    Comment: Performed at [facility], [address] [place], [place] [address]    MICRO: reviewed IMAGING: No results found.  HISTORICAL MICRO/IMAGING  Assessment/Plan:  superficial  skin lesion, previously an abscess that in the stage of healing. Very little drainage. Superficial swab culture showing esbl +  - it appears healing and would not recommend to treat. She doesn't need abtx - keep wound covered, continue with local wound care - per hospital policy, she is on contact isolation  Interms of her going to her infant in nicu ==  Place new gown on patient prior to going to nicu. Advocate excellent hand hygiene before handling baby Can do skin to skin Wipe chair down after she is finished at the end of her visit  Hand hygiene is the main protective Infection control measure to do.  No other isolation needed at home.  evaluation of this patient requires complex antimicrobial therapy evaluation and counseling and isolation needs for disease transmission risk assessment and mitigation.   I have personally spent 80 minutes involved in face-to-face and non-face-to-face activities for this patient on the day of the visit. Professional time spent includes the following activities: Preparing to see the patient (review of tests), Obtaining and/or reviewing separately obtained history (admission/discharge record), Performing a medically appropriate examination and/or evaluation , Ordering medications/tests/procedures, referring and communicating with other health care professionals, Documenting clinical information in the EMR, Independently interpreting results (not separately reported), Communicating results to the patient/family/caregiver, Counseling and educating the patient/family/caregiver and Care coordination (not separately reported).

## 2023-08-25 NOTE — Progress Notes (Signed)
 Subjective: Postpartum Day 5: Cesarean Delivery Patient reports tolerating PO and + flatus.   HA has resolved on Mag Pain managed with oral meds  Objective: Vital signs in last 24 hours: Temp:  [97.7 F (36.5 C)-98.4 F (36.9 C)] 97.7 F (36.5 C) (05/01 1124) Pulse Rate:  [86-127] 86 (05/01 1124) Resp:  [16-18] 16 (05/01 1233) BP: (135-155)/(67-88) 135/84 (05/01 1124) SpO2:  [94 %-99 %] 99 % (05/01 1124)  Physical Exam:  General: alert, cooperative, appears stated age, and no distress Lochia: appropriate Uterine Fundus: firm Incision: wound vac in place DVT Evaluation: No evidence of DVT seen on physical exam. Perineal lesion covered by tegaderm and nurse reports has drained and looks like folliculitis  Recent Labs    08/24/23 0839 08/25/23 0457  HGB 7.8* 7.8*  HCT 24.6* 24.5*    Assessment/Plan: Status post Cesarean section. Postoperative course complicated by severe PIH now on 2nd course of PP magnesium  with reosolution of HA   Will d/c mag at 24 hours and increase BP meds to control BP (increase to 400mg  TID) ID to see and Pharm to start IV Abx for ESBL of buttocks lesion.  Will also address contact precautions .  Denette Finner, MD 08/25/2023, 1:00 PM

## 2023-08-26 MED ORDER — ONDANSETRON 4 MG PO TBDP
4.0000 mg | ORAL_TABLET | Freq: Three times a day (TID) | ORAL | 0 refills | Status: AC | PRN
Start: 2023-08-26 — End: ?

## 2023-08-26 MED ORDER — OXYCODONE HCL 5 MG PO TABS
5.0000 mg | ORAL_TABLET | ORAL | 0 refills | Status: AC | PRN
Start: 2023-08-26 — End: ?

## 2023-08-26 MED ORDER — LABETALOL HCL 400 MG PO TABS: 400.0000 mg | ORAL_TABLET | Freq: Three times a day (TID) | ORAL | 0 refills | Status: AC

## 2023-08-26 MED ORDER — ZOLPIDEM TARTRATE 5 MG PO TABS: 5.0000 mg | ORAL_TABLET | Freq: Every evening | ORAL | 0 refills | Status: AC | PRN

## 2023-08-26 NOTE — Discharge Summary (Signed)
 Postpartum Discharge Summary  Date of Service Aug 26, 2023     Patient Name: [name] DOB: 30-Sep-1990 MRN: [id number]  Date of admission: 08/20/2023 Delivery date:08/20/2023 Delivering provider: [name] Date of discharge: 08/26/2023  Admitting diagnosis: Pregnant [Z34.90] Intrauterine pregnancy: [redacted]w[redacted]d     Secondary diagnosis:  Principal Problem:   Pregnant  Additional problems: none    Discharge diagnosis: Preterm Pregnancy Delivered, CHTN with superimposed preeclampsia, and Type 2 DM                                              Post partum procedures: none Augmentation: N/A Complications: None  Hospital course: Sceduled C/S   33 y.o. yo [id number] at [redacted]w[redacted]d was admitted to the hospital 08/20/2023 for scheduled cesarean section with the following indication: severe preeclampsia  .Delivery details are as follows:  Membrane Rupture Time/Date:  ,   Delivery Method:C-Section, Low Transverse Operative Delivery:N/A Details of operation can be found in separate operative note.  Patient had a postpartum course complicated by nothing.  She is ambulating, tolerating a regular diet, passing flatus, and urinating well. Patient is discharged home in stable condition on  08/26/23        Newborn Data: Birth date:08/20/2023 Birth time:8:26 AM Gender:Female Living status:Living Apgars:2 ,5  Weight:3340 g    Magnesium  Sulfate received: Yes: Seizure prophylaxis BMZ received: Yes Rhophylac:N/A MMR:N/A T-DaP:Given prenatally Flu: N/A RSV Vaccine received: No Transfusion:No Immunizations administered: Immunization History  Administered Date(s) Administered   Tdap 05/29/2012    Physical exam  Vitals:   08/25/23 1717 08/25/23 1941 08/26/23 0424 08/26/23 0809  BP: (!) 150/77 139/83 139/76 (!) 148/84  Pulse: 92 86 90 85  Resp: 17 17 16 17   Temp: 98.3 F (36.8 C) 98.5 F (36.9 C) 98.3 F (36.8 C) 98.6 F (37 C)  TempSrc: Oral Oral Oral Oral  SpO2: 100% 97% 96% 98%   Weight:      Height:       General: alert, cooperative, and no distress Lochia: appropriate Uterine Fundus: firm Incision: Healing well with no significant drainage DVT Evaluation: No evidence of DVT seen on physical exam. Labs: Lab Results  Component Value Date   WBC 9.8 08/25/2023   HGB 7.8 (L) 08/25/2023   HCT 24.5 (L) 08/25/2023   MCV 72.7 (L) 08/25/2023   PLT 331 08/25/2023      Latest Ref Rng & Units 08/25/2023    4:57 AM  CMP  Glucose 70 - 99 mg/dL 130   BUN 6 - 20 mg/dL 6   Creatinine 8.65 - 7.84 mg/dL 6.96   Sodium 295 - 284 mmol/L 136   Potassium 3.5 - 5.1 mmol/L 3.4   Chloride 98 - 111 mmol/L 102   CO2 22 - 32 mmol/L 23   Calcium  8.9 - 10.3 mg/dL 7.6   Total Protein 6.5 - 8.1 g/dL 5.5   Total Bilirubin 0.0 - 1.2 mg/dL 0.4   Alkaline Phos 38 - 126 U/L 73   AST 15 - 41 U/L 35   ALT 0 - 44 U/L 44    Edinburgh Score:    08/21/2023    5:44 AM  Edinburgh Postnatal Depression Scale Screening Tool  I have been able to laugh and see the funny side of things. 0  I have looked forward with enjoyment to things. 0  I  have blamed myself unnecessarily when things went wrong. 0  I have been anxious or worried for no good reason. 0  I have felt scared or panicky for no good reason. 0  Things have been getting on top of me. 0  I have been so unhappy that I have had difficulty sleeping. 2  I have felt sad or miserable. 1  I have been so unhappy that I have been crying. 1  The thought of harming myself has occurred to me. 0  Edinburgh Postnatal Depression Scale Total 4      After visit meds:  Allergies as of 08/26/2023       Reactions   Nsaids    Diagnosed with a perforated duodenal ulcer due to NSAIDS        Medication List     STOP taking these medications    NIFEdipine  30 MG 24 hr tablet Commonly known as: PROCARDIA -XL/NIFEDICAL-XL       TAKE these medications    cyclobenzaprine  5 MG tablet Commonly known as: FLEXERIL  Take 1 tablet (5 mg total)  by mouth 3 (three) times daily as needed for muscle spasms. MAY CAUSE DROWSINESS, DO NOT OPERATE HEAVY MACHINERY AFTER TAKING THIS MEDICATION   cyclobenzaprine  10 MG tablet Commonly known as: FLEXERIL  Take 1 tablet (10 mg total) by mouth 3 (three) times daily as needed for muscle spasms.   Doxylamine -Pyridoxine  10-10 MG Tbec Commonly known as: Diclegis  Take 1-2 tablets by mouth at bedtime.   famotidine  40 MG tablet Commonly known as: PEPCID  Take 40 mg by mouth 2 (two) times daily.   glyBURIDE  5 MG tablet Commonly known as: DIABETA  Take 1 tablet (5 mg total) by mouth daily with breakfast.   Labetalol  HCl 400 MG Tabs Take 400 mg by mouth 3 (three) times daily.   ondansetron  4 MG disintegrating tablet Commonly known as: ZOFRAN -ODT Take 1 tablet (4 mg total) by mouth every 8 (eight) hours as needed for nausea or vomiting.   oxyCODONE  5 MG immediate release tablet Commonly known as: Oxy IR/ROXICODONE  Take 1-2 tablets (5-10 mg total) by mouth every 4 (four) hours as needed for moderate pain (pain score 4-6). What changed:  how much to take reasons to take this   polyethylene glycol 17 g packet Commonly known as: MiraLax  Take 17 g by mouth daily.   zolpidem  5 MG tablet Commonly known as: AMBIEN  Take 1 tablet (5 mg total) by mouth at bedtime as needed for sleep.               Discharge Care Instructions  (From admission, onward)           Start     Ordered   08/26/23 0000  Discharge wound care:       Comments: Shower daily and keep area dry   08/26/23 0957             Discharge home in stable condition Infant Feeding: Breast Infant Disposition:home with mother Discharge instruction: per After Visit Summary and Postpartum booklet. Activity: Advance as tolerated. Pelvic rest for 6 weeks.  Diet: routine diet Anticipated Birth Control: Unsure Postpartum Appointment:2 weeks Additional Postpartum F/U: BP check 1 week Future Appointments:No future  appointments. Follow up Visit:      08/26/2023 Martine Sleek, MD

## 2023-08-29 ENCOUNTER — Ambulatory Visit (HOSPITAL_COMMUNITY): Payer: Self-pay

## 2023-08-29 NOTE — Lactation Note (Signed)
 This note was copied from a baby's chart.  NICU Lactation Consultation Note  Patient Name: Boy Briahna Mahdavi [id number] Date: 08/29/2023 Age:32 days  Reason for consult: Initial assessment; NICU baby; Late-preterm 34-36.6wks; Maternal endocrine disorder Type of Endocrine Disorder?: Diabetes  SUBJECTIVE  RN called asking for LC to assist Mom to start pumping as Mom had asked about starting to support her milk supply.    RN set up DEBP and LC sized Mom with 21 flanges.  LC assisted Mom to pump on initiation setting.  Mom will be made a pumping top tomorrow, or RN to help tonight.  Mom encouraged to pump both breasts every 2-3 hrs during the day and 3-4 hrs at night. 2 basins provided in baby's room to wash and dry pump parts.  LC will submit a STORK pump request tomorrow.    OBJECTIVE Infant data: Mother's Current Feeding Choice: Breast Milk and Formula  O2 Device: Room Air  Infant feeding assessment IDFTS - Readiness: 3   Maternal data: [id number] C-Section, Low Transverse Has patient been taught Hand Expression?: Yes Hand Expression Comments: drops noted Significant Breast History:: ++ breast changes Current breast feeding challenges:: delayed onset of pumping and/or breastfeeding Does the patient have breastfeeding experience prior to this delivery?: Yes How long did the patient breastfeed?: 3 months with her last baby Pumping frequency: Mom encouraged to pump 8 times per 24 hrs Pumped volume: 4 mL Flange Size: 21 Hands-free pumping top sizes: X-Large Marrie Sizer) Risk factor for low/delayed milk supply:: infant separation and initiated double pumping at 9 days post partum  WIC Program: No WIC Referral Sent?: No Pump: Hands Free, Personal (unknown brand)  ASSESSMENT Infant:   Feeding Status: Scheduled 9-12-3-6 Feeding method: Tube/Gavage (Bolus)  Maternal: Milk volume: Low  INTERVENTIONS/PLAN Interventions: Interventions: Skin to skin; Breast massage; Hand express;  DEBP; Education; CDC Guidelines for Breast Pump Cleaning; LC Services brochure Discharge Education: Engorgement and breast care Tools: Pump; Flanges; Hands-free pumping top Pump Education: Setup, frequency, and cleaning; Milk Storage  Plan: Consult Status: NICU follow-up NICU Follow-up type: Verify absence of engorgement; Verify onset of copious milk; Verify DEBP issuance   Dario Edison 08/29/2023, 6:29 PM

## 2023-08-31 ENCOUNTER — Ambulatory Visit (HOSPITAL_COMMUNITY): Payer: Self-pay

## 2023-08-31 NOTE — Lactation Note (Signed)
 This note was copied from a baby's chart. Lactation Consultation Note  Patient Name: [name] [id number] Date: 08/31/2023 Age:31 days Reason for consult: Follow-up assessment;NICU baby;Late-preterm 34-36.6wks  Per bedside RN, mom does not need to follow up with LC anymore. She originally chose formula, was "talked into trying" breast feeding, but will not be continuing beyond discharge. Bedside RN unsure about stork pump delivery- not in NICU room 341 today.   Feeding Mother's Current Feeding Choice: Formula   Consult Status Consult Status: Complete Date: 08/31/23 Follow-up type: In-patient    [facility] 08/31/2023, 5:37 PM

## 2023-09-02 ENCOUNTER — Telehealth (HOSPITAL_COMMUNITY): Payer: Self-pay | Admitting: *Deleted

## 2023-09-02 NOTE — Telephone Encounter (Signed)
 09/02/2023  Name: [name] MRN: [id number] DOB: Sep 07, 1990  Reason for Call:  Transition of Care Hospital Discharge Call  Contact Status: Patient Contact Status: Complete  Language assistant needed: Interpreter Mode: Interpreter Not Needed        Follow-Up Questions: Do You Have Any Concerns About Your Health As You Heal From Delivery?: No Do You Have Any Concerns About Your Infants Health?: Infant in NICU  Edinburgh Postnatal Depression Scale:  In the Past 7 Days:    PHQ2-9 Depression Scale:     Discharge Follow-up: Edinburgh score requires follow up?:  (declined screening - was visiting baby in NICU and needed to end call, endorses she is doing well emotionally)  Post-discharge interventions: Reviewed Newborn Safe Sleep Practices  Pearlie Bougie, RN 09/02/2023 10:36

## 2023-09-16 ENCOUNTER — Inpatient Hospital Stay (HOSPITAL_COMMUNITY): Admit: 2023-09-16 | Payer: Self-pay | Admitting: Obstetrics and Gynecology

## 2023-09-26 DIAGNOSIS — Z1389 Encounter for screening for other disorder: Secondary | ICD-10-CM | POA: Diagnosis not present

## 2023-09-26 DIAGNOSIS — Z1151 Encounter for screening for human papillomavirus (HPV): Secondary | ICD-10-CM | POA: Diagnosis not present

## 2023-09-26 DIAGNOSIS — Z3202 Encounter for pregnancy test, result negative: Secondary | ICD-10-CM | POA: Diagnosis not present

## 2023-09-26 DIAGNOSIS — Z124 Encounter for screening for malignant neoplasm of cervix: Secondary | ICD-10-CM | POA: Diagnosis not present

## 2023-09-26 DIAGNOSIS — Z3183 Encounter for assisted reproductive fertility procedure cycle: Secondary | ICD-10-CM | POA: Diagnosis not present

## 2023-09-26 DIAGNOSIS — G8918 Other acute postprocedural pain: Secondary | ICD-10-CM | POA: Diagnosis not present

## 2024-02-08 DIAGNOSIS — R87810 Cervical high risk human papillomavirus (HPV) DNA test positive: Secondary | ICD-10-CM | POA: Diagnosis not present

## 2024-02-08 DIAGNOSIS — Z124 Encounter for screening for malignant neoplasm of cervix: Secondary | ICD-10-CM | POA: Diagnosis not present

## 2024-02-08 DIAGNOSIS — R87612 Low grade squamous intraepithelial lesion on cytologic smear of cervix (LGSIL): Secondary | ICD-10-CM | POA: Diagnosis not present

## 2024-02-08 DIAGNOSIS — Z1151 Encounter for screening for human papillomavirus (HPV): Secondary | ICD-10-CM | POA: Diagnosis not present

## 2024-03-02 DIAGNOSIS — R87610 Atypical squamous cells of undetermined significance on cytologic smear of cervix (ASC-US): Secondary | ICD-10-CM | POA: Diagnosis not present

## 2024-03-02 DIAGNOSIS — Z3202 Encounter for pregnancy test, result negative: Secondary | ICD-10-CM | POA: Diagnosis not present

## 2024-03-26 DIAGNOSIS — Z3202 Encounter for pregnancy test, result negative: Secondary | ICD-10-CM | POA: Diagnosis not present

## 2024-03-26 DIAGNOSIS — R87629 Unspecified abnormal cytological findings in specimens from vagina: Secondary | ICD-10-CM | POA: Diagnosis not present

## 2024-03-26 DIAGNOSIS — R87613 High grade squamous intraepithelial lesion on cytologic smear of cervix (HGSIL): Secondary | ICD-10-CM | POA: Diagnosis not present

## 2024-05-29 NOTE — Progress Notes (Signed)
 [name]                                          MRN: [id number]   05/29/2024   The [facility] Quality Team Specialist reviewed this patient medical record for the purposes of chart review for care gap closure. The following were reviewed: chart review for care gap closure-kidney health evaluation for diabetes:eGFR  and uACR.    [facility] Quality Team
